# Patient Record
Sex: Female | Born: 1945
Health system: Southern US, Community
[De-identification: ages and names within clinical notes are randomized; demographics above are authoritative.]

## PROBLEM LIST (undated history)

## (undated) DIAGNOSIS — K439 Ventral hernia without obstruction or gangrene: Secondary | ICD-10-CM

## (undated) DIAGNOSIS — M1990 Unspecified osteoarthritis, unspecified site: Secondary | ICD-10-CM

## (undated) DIAGNOSIS — D649 Anemia, unspecified: Secondary | ICD-10-CM

## (undated) DIAGNOSIS — I639 Cerebral infarction, unspecified: Secondary | ICD-10-CM

## (undated) DIAGNOSIS — M51369 Other intervertebral disc degeneration, lumbar region without mention of lumbar back pain or lower extremity pain: Secondary | ICD-10-CM

## (undated) DIAGNOSIS — K5090 Crohn's disease, unspecified, without complications: Secondary | ICD-10-CM

## (undated) DIAGNOSIS — M5431 Sciatica, right side: Secondary | ICD-10-CM

## (undated) DIAGNOSIS — M5136 Other intervertebral disc degeneration, lumbar region: Secondary | ICD-10-CM

## (undated) DIAGNOSIS — I10 Essential (primary) hypertension: Secondary | ICD-10-CM

## (undated) HISTORY — DX: Essential (primary) hypertension: I10

## (undated) HISTORY — PX: CAROTID ENDARTERECTOMY: SUR193

## (undated) HISTORY — DX: Crohn's disease, unspecified, without complications: K50.90

## (undated) HISTORY — PX: HEMICOLECTOMY: SHX854

## (undated) HISTORY — PX: TUBAL LIGATION: SHX77

## (undated) HISTORY — PX: CHOLECYSTECTOMY: SHX55

---

## 2002-01-21 ENCOUNTER — Emergency Department (HOSPITAL_COMMUNITY): Admission: EM | Admit: 2002-01-21 | Discharge: 2002-01-21 | Payer: Self-pay | Admitting: Emergency Medicine

## 2002-01-21 ENCOUNTER — Encounter: Payer: Self-pay | Admitting: Emergency Medicine

## 2002-01-25 ENCOUNTER — Encounter: Payer: Self-pay | Admitting: Emergency Medicine

## 2002-01-25 ENCOUNTER — Ambulatory Visit (HOSPITAL_COMMUNITY): Admission: RE | Admit: 2002-01-25 | Discharge: 2002-01-25 | Payer: Self-pay | Admitting: Emergency Medicine

## 2002-05-04 ENCOUNTER — Encounter: Payer: Self-pay | Admitting: Vascular Surgery

## 2002-05-06 ENCOUNTER — Encounter (INDEPENDENT_AMBULATORY_CARE_PROVIDER_SITE_OTHER): Payer: Self-pay | Admitting: *Deleted

## 2002-05-06 ENCOUNTER — Inpatient Hospital Stay (HOSPITAL_COMMUNITY): Admission: RE | Admit: 2002-05-06 | Discharge: 2002-05-08 | Payer: Self-pay | Admitting: Vascular Surgery

## 2004-09-01 ENCOUNTER — Inpatient Hospital Stay (HOSPITAL_COMMUNITY): Admission: EM | Admit: 2004-09-01 | Discharge: 2004-09-03 | Payer: Self-pay | Admitting: Emergency Medicine

## 2004-09-01 ENCOUNTER — Ambulatory Visit: Payer: Self-pay | Admitting: Internal Medicine

## 2004-10-30 ENCOUNTER — Inpatient Hospital Stay (HOSPITAL_COMMUNITY): Admission: EM | Admit: 2004-10-30 | Discharge: 2004-11-04 | Payer: Self-pay | Admitting: Emergency Medicine

## 2004-10-30 ENCOUNTER — Ambulatory Visit: Payer: Self-pay | Admitting: Gastroenterology

## 2004-11-01 ENCOUNTER — Encounter: Payer: Self-pay | Admitting: Physical Medicine and Rehabilitation

## 2006-04-11 ENCOUNTER — Observation Stay (HOSPITAL_COMMUNITY): Admission: EM | Admit: 2006-04-11 | Discharge: 2006-04-16 | Payer: Self-pay | Admitting: Emergency Medicine

## 2006-04-11 ENCOUNTER — Ambulatory Visit: Payer: Self-pay | Admitting: Gastroenterology

## 2006-04-14 ENCOUNTER — Encounter (INDEPENDENT_AMBULATORY_CARE_PROVIDER_SITE_OTHER): Payer: Self-pay | Admitting: *Deleted

## 2006-06-29 ENCOUNTER — Inpatient Hospital Stay (HOSPITAL_COMMUNITY): Admission: EM | Admit: 2006-06-29 | Discharge: 2006-07-08 | Payer: Self-pay | Admitting: Emergency Medicine

## 2006-06-29 ENCOUNTER — Ambulatory Visit: Payer: Self-pay | Admitting: Internal Medicine

## 2006-07-03 ENCOUNTER — Encounter (INDEPENDENT_AMBULATORY_CARE_PROVIDER_SITE_OTHER): Payer: Self-pay | Admitting: Specialist

## 2006-07-21 ENCOUNTER — Emergency Department (HOSPITAL_COMMUNITY): Admission: EM | Admit: 2006-07-21 | Discharge: 2006-07-21 | Payer: Self-pay | Admitting: Emergency Medicine

## 2007-03-08 ENCOUNTER — Emergency Department (HOSPITAL_COMMUNITY): Admission: EM | Admit: 2007-03-08 | Discharge: 2007-03-08 | Payer: Self-pay | Admitting: Emergency Medicine

## 2008-12-11 ENCOUNTER — Emergency Department (HOSPITAL_COMMUNITY): Admission: EM | Admit: 2008-12-11 | Discharge: 2008-12-11 | Payer: Self-pay | Admitting: Emergency Medicine

## 2008-12-31 ENCOUNTER — Emergency Department (HOSPITAL_COMMUNITY): Admission: EM | Admit: 2008-12-31 | Discharge: 2008-12-31 | Payer: Self-pay | Admitting: Emergency Medicine

## 2010-03-13 ENCOUNTER — Ambulatory Visit (HOSPITAL_COMMUNITY): Admission: RE | Admit: 2010-03-13 | Discharge: 2010-03-13 | Payer: Self-pay | Admitting: Family Medicine

## 2010-09-22 LAB — URINALYSIS, ROUTINE W REFLEX MICROSCOPIC
Bilirubin Urine: NEGATIVE
Glucose, UA: NEGATIVE mg/dL
Ketones, ur: NEGATIVE mg/dL
Specific Gravity, Urine: <1.005 — ABNORMAL LOW (ref 1.005–1.030)
Urobilinogen, UA: 0.2 mg/dL (ref 0.0–1.0)
pH: 6 (ref 5.0–8.0)

## 2010-09-22 LAB — URINE CULTURE

## 2010-09-22 LAB — URINE MICROSCOPIC-ADD ON

## 2010-09-23 LAB — CBC
MCHC: 35.2 g/dL (ref 30.0–36.0)
MCV: 97.1 fL (ref 78.0–100.0)
RBC: 3.57 MIL/uL — ABNORMAL LOW (ref 3.87–5.11)

## 2010-09-23 LAB — POCT CARDIAC MARKERS
CKMB, poc: 1.1 ng/mL (ref 1.0–8.0)
Myoglobin, poc: 64 ng/mL (ref 12–200)
Troponin i, poc: <0.05 ng/mL (ref 0.00–0.09)

## 2010-09-23 LAB — BASIC METABOLIC PANEL
CO2: 27 mEq/L (ref 19–32)
Chloride: 104 mEq/L (ref 96–112)
Glucose, Bld: 98 mg/dL (ref 70–99)

## 2010-09-23 LAB — DIFFERENTIAL
Basophils Absolute: 0 10*3/uL (ref 0.0–0.1)
Basophils Relative: 0 % (ref 0–1)
Eosinophils Relative: 2 % (ref 0–5)
Lymphocytes Relative: 23 % (ref 12–46)
Neutro Abs: 4.6 10*3/uL (ref 1.7–7.7)
Neutrophils Relative %: 68 % (ref 43–77)

## 2010-09-26 ENCOUNTER — Emergency Department (HOSPITAL_COMMUNITY): Payer: Medicare Other

## 2010-09-26 ENCOUNTER — Inpatient Hospital Stay (HOSPITAL_COMMUNITY)
Admission: AD | Admit: 2010-09-26 | Discharge: 2010-09-29 | DRG: 389 | Disposition: A | Discharge status: Home / Self Care | Payer: Medicare Other | Source: Ambulatory Visit | Attending: General Surgery | Admitting: General Surgery

## 2010-09-26 DIAGNOSIS — K560 Paralytic ileus: Principal | ICD-10-CM | POA: Diagnosis present

## 2010-09-26 DIAGNOSIS — N390 Urinary tract infection, site not specified: Secondary | ICD-10-CM | POA: Diagnosis present

## 2010-09-26 DIAGNOSIS — K5090 Crohn's disease, unspecified, without complications: Secondary | ICD-10-CM | POA: Diagnosis present

## 2010-09-26 DIAGNOSIS — K430 Incisional hernia with obstruction, without gangrene: Secondary | ICD-10-CM | POA: Diagnosis present

## 2010-09-26 DIAGNOSIS — I10 Essential (primary) hypertension: Secondary | ICD-10-CM | POA: Diagnosis present

## 2010-09-26 LAB — BASIC METABOLIC PANEL
BUN: 13 mg/dL (ref 6–23)
CO2: 27 mEq/L (ref 19–32)
Calcium: 10 mg/dL (ref 8.4–10.5)
Creatinine, Ser: 0.96 mg/dL (ref 0.4–1.2)
Glucose, Bld: 129 mg/dL — ABNORMAL HIGH (ref 70–99)
Sodium: 131 mEq/L — ABNORMAL LOW (ref 135–145)

## 2010-09-26 LAB — DIFFERENTIAL
Eosinophils Absolute: 0.2 10*3/uL (ref 0.0–0.7)
Eosinophils Relative: 1 % (ref 0–5)
Monocytes Absolute: 0.9 10*3/uL (ref 0.1–1.0)
Monocytes Relative: 5 % (ref 3–12)
Neutro Abs: 15.8 10*3/uL — ABNORMAL HIGH (ref 1.7–7.7)
Neutrophils Relative %: 81 % — ABNORMAL HIGH (ref 43–77)

## 2010-09-26 LAB — CBC
HCT: 43.3 % (ref 36.0–46.0)
Hemoglobin: 14.8 g/dL (ref 12.0–15.0)
MCH: 33.1 pg (ref 26.0–34.0)
MCHC: 34.2 g/dL (ref 30.0–36.0)
MCV: 96.9 fL (ref 78.0–100.0)

## 2010-09-26 LAB — URINALYSIS, ROUTINE W REFLEX MICROSCOPIC
Glucose, UA: NEGATIVE mg/dL
Ketones, ur: NEGATIVE mg/dL
Leukocytes, UA: NEGATIVE
Nitrite: NEGATIVE
Protein, ur: NEGATIVE mg/dL

## 2010-09-26 LAB — URINE MICROSCOPIC-ADD ON

## 2010-09-27 LAB — DIFFERENTIAL
Basophils Absolute: 0 10*3/uL (ref 0.0–0.1)
Eosinophils Absolute: 0 10*3/uL (ref 0.0–0.7)
Eosinophils Relative: 0 % (ref 0–5)
Lymphocytes Relative: 6 % — ABNORMAL LOW (ref 12–46)
Monocytes Absolute: 0.7 10*3/uL (ref 0.1–1.0)

## 2010-09-27 LAB — COMPREHENSIVE METABOLIC PANEL
BUN: 13 mg/dL (ref 6–23)
Calcium: 8.6 mg/dL (ref 8.4–10.5)
Glucose, Bld: 130 mg/dL — ABNORMAL HIGH (ref 70–99)
Sodium: 133 mEq/L — ABNORMAL LOW (ref 135–145)
Total Protein: 6.4 g/dL (ref 6.0–8.3)

## 2010-09-27 LAB — CBC
HCT: 38.5 % (ref 36.0–46.0)
MCHC: 34 g/dL (ref 30.0–36.0)
MCV: 96.7 fL (ref 78.0–100.0)
RDW: 12.7 % (ref 11.5–15.5)

## 2010-09-27 MED ORDER — IOHEXOL 300 MG/ML  SOLN
100.0000 mL | Freq: Once | INTRAMUSCULAR | Status: AC | PRN
  Administered 2010-09-27: 100 mL via INTRAVENOUS

## 2010-09-28 LAB — COMPREHENSIVE METABOLIC PANEL
AST: 16 U/L (ref 0–37)
BUN: 8 mg/dL (ref 6–23)
CO2: 24 mEq/L (ref 19–32)
Chloride: 95 mEq/L — ABNORMAL LOW (ref 96–112)
Creatinine, Ser: 0.84 mg/dL (ref 0.4–1.2)
GFR calc non Af Amer: >60 mL/min (ref >60)
Total Bilirubin: 0.4 mg/dL (ref 0.3–1.2)

## 2010-09-28 LAB — CBC
HCT: 38.9 % (ref 36.0–46.0)
Hemoglobin: 13.1 g/dL (ref 12.0–15.0)
MCH: 32.6 pg (ref 26.0–34.0)
MCV: 96.8 fL (ref 78.0–100.0)
RBC: 4.02 MIL/uL (ref 3.87–5.11)

## 2010-09-28 LAB — DIFFERENTIAL
Lymphs Abs: 1.5 10*3/uL (ref 0.7–4.0)
Monocytes Relative: 8 % (ref 3–12)
Neutro Abs: 9.6 10*3/uL — ABNORMAL HIGH (ref 1.7–7.7)
Neutrophils Relative %: 79 % — ABNORMAL HIGH (ref 43–77)

## 2010-09-28 LAB — PHOSPHORUS: Phosphorus: 2.8 mg/dL (ref 2.3–4.6)

## 2010-09-29 LAB — COMPREHENSIVE METABOLIC PANEL
Albumin: 3.1 g/dL — ABNORMAL LOW (ref 3.5–5.2)
BUN: 9 mg/dL (ref 6–23)
Calcium: 8.8 mg/dL (ref 8.4–10.5)
Creatinine, Ser: 0.78 mg/dL (ref 0.4–1.2)
Potassium: 4.1 mEq/L (ref 3.5–5.1)
Total Protein: 6.2 g/dL (ref 6.0–8.3)

## 2010-09-29 LAB — DIFFERENTIAL
Basophils Relative: 0 % (ref 0–1)
Eosinophils Absolute: 0.1 10*3/uL (ref 0.0–0.7)
Monocytes Absolute: 0.7 10*3/uL (ref 0.1–1.0)
Neutrophils Relative %: 74 % (ref 43–77)

## 2010-09-29 LAB — CBC
MCHC: 34.4 g/dL (ref 30.0–36.0)
MCV: 95.9 fL (ref 78.0–100.0)
Platelets: 180 10*3/uL (ref 150–400)
RDW: 12.6 % (ref 11.5–15.5)
WBC: 12.3 10*3/uL — ABNORMAL HIGH (ref 4.0–10.5)

## 2010-09-29 LAB — PHOSPHORUS: Phosphorus: 2.6 mg/dL (ref 2.3–4.6)

## 2010-09-30 NOTE — H&P (Signed)
[name], [name] NO.:  [id number]  MEDICAL RECORD NO.:  [id number]           PATIENT TYPE:  I  LOCATION:  A326                          FACILITY:  [facility]  PHYSICIAN:  Tilford Pillar, MD      DATE OF BIRTH:  15-Mar-1946  DATE OF ADMISSION:  09/26/2010 DATE OF DISCHARGE:  LH                             HISTORY & PHYSICAL   CHIEF COMPLAINT:  Diffuse abdominal pain.  HISTORY OF PRESENT ILLNESS:  The patient is a 65 year old female who presented to [facility] emergency department with increasing diffuse abdominal pain and nausea and emesis.  She states she has had similar symptomatology in the past.  She denies any fevers or chills. She has not had any significant change with oral intake.  Her last bowel movement was actually yesterday and this was normal.  No melena, no hematochezia, no constipation, and no diarrhea.  She has had normal urination.  She denies any change in urination.  No hematuria.  No dysuria.  PAST MEDICAL HISTORY: 1. Hypertension. 2. Arthritis. 3. Crohn disease. 4. History of CVA.  PAST SURGICAL HISTORY: 1. Carotid endarterectomy. 2. Right hemicolectomy.  MEDICATIONS:  History of Pentasa utilization, she has not been on any Crohn's medications over the last several years, metoprolol, lisinopril, and aspirin.  ALLERGIES:  METRONIDAZOLE.  SOCIAL HISTORY:  No tobacco and no alcohol use.  FAMILY HISTORY:  Noncontributory.  REVIEW OF SYSTEMS:  CONSTITUTIONAL:  Unremarkable.  EYES:  Unremarkable. EARS, NOSE, AND THROAT:  Unremarkable.  RESPIRATORY:  Unremarkable. CARDIOVASCULAR:  Unremarkable.  GASTROINTESTINAL:  As per HPI. GENITOURINARY:  Unremarkable.  MUSCULOSKELETAL:  Unremarkable.  SKIN: Unremarkable.  ENDOCRINE:  Unremarkable.  PHYSICAL EXAMINATION:  VITAL SIGNS:  Temperature 98.5, heart rate 106, respirations 17, blood pressure 142/82, and she has 94% O2 saturation on room air. GENERAL:  She is in a supine position  on the hospital bed.  She is not in any acute distress but she does appear somewhat uncomfortable.  She is alert and oriented x3. HEENT:  Scalp:  No deformities or masses.  Eyes:  Pupils equal, round, and reactive to light.  Extraocular movements are intact.  No scleral icterus.  Conjunctivae pallor is noted.  Oral mucosa pink.  No occlusion. NECK:  Trachea is midline.  No cervical lymphadenopathy. PULMONARY:  Unlabored respiration.  She is clear to auscultation bilaterally. CARDIOVASCULAR:  Tachycardic but regular rhythm.  No murmurs or gallops. She has 2+ radial and femoral pulses bilaterally. ABDOMEN:  Diminished but present bowel sounds.  Abdomen is soft, obese, moderate-to-mild tenderness in the left lower quadrant.  No diffuse peritoneal signs.  She does have a large palpable midline incisional hernia.  No discomfort or pain on palpation of the hernia. EXTREMITIES:  Warm and dry.  PERTINENT LABORATORY DATA AND STUDIES:  CBC:  White blood cell count 17.3, hemoglobin 13.1, hematocrit 38.5, and platelets 200.  Basic metabolic panel:  Sodium 133, potassium 4.8, chloride 96, bicarb 29, BUN 13, creatinine 0.95, and blood glucose 130.  UA demonstrates positive blood and bacteria.  CT of the abdomen and pelvis demonstrates no evidence  any free air or free fluid.  There is a large midline incisional hernia with incarcerated distal stomach, colon, and small bowel.  There is no clear transition point within the hernia.  There is prominent bowel throughout the abdomen but no clear dilatation and/or transition point.  There is stool noted throughout the colon.  No colon wall thickening.  No free fluid is noted.  ASSESSMENT AND PLAN:  Abdominal pain, unspecified.  Plan at this point is to continue the patient in n.p.o. status, continue IV fluid. Nasogastric was placed in the emergency department and as this has been placed we will continue it, especially as the patient is continuing to have  symptoms of nausea.  My suspicion is that she may have some component of an ileus process, especially with the possibility of urinary tract infection.  We will start her empirically on broad- spectrum antibiotics, although clear etiology has not been identified. I am also suspicious that some of this patient symptomatology may be from her Crohn disease, although her symptomatology is not necessarily classic and/or consistent for her past flare-ups.  She has not seen a gastroenterologist for management and states that she has not had required any management that she was told that she would no longer need management for her Crohn disease.  She also states that Dr. Lovell Sheehan who she reports is her primary physician has not seen her over the last several years.  When asked if she had seen her primary care physician, the patient denies having seen him recently.  I believe this is Dr. Regino Schultze.  At this point, we will again continue the patient with IV fluid hydration, empiric antibiotics, bowel rest, deep venous thrombosis prophylaxis, and close monitoring.  Should her abdominal symptomatology become more consistent with a Crohns flare, Gastroenterology consultation may be obtained.  However, at this time, we will continue conservative management.     Tilford Pillar, MD     BZ/MEDQ  D:  09/27/2010  T:  09/28/2010  Job:  [id number]  Electronically Signed by Tilford Pillar MD on 09/30/2010 11:24:10 AM

## 2010-10-10 ENCOUNTER — Ambulatory Visit (INDEPENDENT_AMBULATORY_CARE_PROVIDER_SITE_OTHER): Payer: Medicare Other | Admitting: Internal Medicine

## 2010-10-10 DIAGNOSIS — K5090 Crohn's disease, unspecified, without complications: Secondary | ICD-10-CM

## 2010-11-01 NOTE — Op Note (Signed)
[name], [name]                            ACCOUNT NO.:  [id number]   MEDICAL RECORD NO.:  [id number]                   PATIENT TYPE:  INP   LOCATION:  3304                                 FACILITY:  [facility]   PHYSICIAN:  Judeth Cornfield. Scot Dock, M.D.        DATE OF BIRTH:  06-May-1946   DATE OF PROCEDURE:  05/06/2002  DATE OF DISCHARGE:                                 OPERATIVE REPORT   PREOPERATIVE DIAGNOSES:  Symptomatic left carotid stenosis.   POSTOPERATIVE DIAGNOSES:  Symptomatic left carotid stenosis.   PROCEDURE:  Left carotid endarterectomy with Dacron patch angioplasty.   SURGEON:  Judeth Cornfield. Scot Dock, M.D.   ASSISTANT:  Nelda Severe. Kellie Simmering, M.D., Marcellus Scott, P.A.   ANESTHESIA:  General.   PREOPERATIVE DIAGNOSES:  Symptomatic left carotid stenosis.   POSTOPERATIVE DIAGNOSES:  Symptomatic left carotid stenosis.   PROCEDURE:  Left carotid endarterectomy with Dacron patch angioplasty.   ANESTHESIA:  General.   INDICATIONS:  This is a 65 year old woman who had presented with a tight,  greater than 95% left carotid stenosis.  She had had a left hemispheric  stroke in August, associated with some residual right upper extremity  weakness and right lower extremity weakness.  Of note when she presented for  her elective surgery today, she had an episode of expressive aphasia which  lasted several minutes and resolved.  She then had a second episode while  waiting for surgery.  We proceeded as planned, given these crescendo TIAs,  consistent with left hemispheric events.  The procedure and potential  complications were discussed with the patient and her family preoperatively.   TECHNIQUE:  The patient was taken to the operating room and received a  general anesthetic.  The left neck was prepped and draped in the usual  sterile fashion.  An incision was made along the anterior border of the  sternocleidomastoid, and dissection was carried down to the common carotid  artery, which was dissected free and controlled with a tourniquet.  Of note,  there was a focal plaque in the more proximal common carotid artery, and  therefore, I extended the incision and extended the dissection down on the  common carotid artery below this plaque.  The facial vein was divided  between 2-0 silk ties, and the internal carotid artery was controlled above  the plaque.  Again, the plaque extended quite high, but I was able to get  above the plaque.  The external carotid artery and superior thyroid arteries  were also controlled.  The patient was then heparinized with 7500 units of  heparin.  Clamps were then placed on the external, the internal, and then  the common carotid artery.  A longitudinal arteriotomy was made over the  common carotid artery, and this was extended through the plaque into the  internal carotid artery above the plaque.  A 10 jet was placed into the  internal carotid artery, backbled, and then  placed into the common carotid  artery and secured with a tourniquet.  Flow was re-established to the shunt.  Next, an endarterectomy plane was established proximally, and the plaque was  sharply divided.  Eversion endarterectomy was performed of the external  carotid artery.  Distally, the plaque tapered nicely.  One tacking suture  was placed with 6-0 Prolene.  A long Dacron patch was then sewn using  continuous 6-0 Prolene suture.  Prior to completing the patch closure, the  shunt was removed, the vessels backbled and flushed appropriately, and the  anastomosis completed.  Flow was re-established first to the external  carotid artery and then to the internal carotid artery.  At the completion,  there was a good pulse distal to the patch and a good Doppler signal.  Hemostasis was obtained in the wound.  The heparin was partially reversed  with Protamine.  The wound was closed in a deep layer of 3-0 Vicryl.  The  platysma was closed with a running 3-0 Vicryl.  The  skin was closed with a 4-  0 subcuticular stitch.  A sterile dressing was applied.  The patient  tolerated the procedure well and was transferred to the recovery room in  satisfactory condition.  All needle and sponge counts were correct.                                               Judeth Cornfield. Scot Dock, M.D.    CSD/MEDQ  D:  05/06/2002  T:  05/07/2002  Job:  [id number]   cc:   Leonides Grills, M.D.  [address] [place] [address]  Fax: [phone]

## 2010-11-01 NOTE — Discharge Summary (Signed)
NAMEWELMA, [name] NO.:  [id number]   MEDICAL RECORD NO.:  [id number]          PATIENT TYPE:  INP   LOCATION:  A327                          FACILITY:  [facility]   PHYSICIAN:  Jamesetta So, M.D.  DATE OF BIRTH:  18-Dec-1945   DATE OF ADMISSION:  06/29/2006  DATE OF DISCHARGE:  01/23/2008LH                               [place] COURSE SUMMARY:  The patient is a 65 year old white female with  a history of Crohn's disease who was admitted by Dr. Stann Mainland of GI for  treatment of an acute flare-up of her Crohn's disease.  Initial CAT  scans revealed fluid collection with perforation around the terminal  ileum.  She was placed on intravenous antibiotics and bowel rest.  She  was also started on Pentasa.  There has been difficulty with patient  compliance with her Crohn's disease medications in the past.  Surgery  consultation was obtained.  A followup CT scan was performed several  days later which revealed resolution of the perforation, though there  was still fluid collection around the terminal ileum.  This had not  changed from a previous CAT scan done several months earlier.  After  discussion with GI and the patient, it was elected to proceed with  surgery.  An ileocecectomy was performed on July 03, 2006.  She  tolerated the procedure remarkably well.  Her postoperative course was  the most part unremarkable.  Her diet was advanced without difficulty.  The patient is being discharged home on July 08, 2006 in good and  improving condition.   DISCHARGE INSTRUCTIONS:  The patient is to follow up with Dr. Aviva Signs on July 14, 2006.  I will set up a followup GI consultation  at that time.  Discharge medications include Darvocet N-100s one to two  tablets p.o. q. 4 hours p.r.n. pain.  She is to resume all her other  medications as previously prescribed.   PRINCIPAL DIAGNOSES:  1. Ileitis secondary to Crohn's disease.  2.  Intra-abdominal abscess.  3. Hypertension.  4. Peripheral vascular disease.  5. A history of a stroke.   PRINCIPAL PROCEDURES:  Ileocecectomy by Dr. Aviva Signs on July 03, 2006.      Jamesetta So, M.D.  Electronically Signed     MAJ/MEDQ  D:  07/08/2006  T:  07/08/2006  Job:  [id number]   cc:   [name], M.D.  [address]  [place] [address]   CHART

## 2010-11-01 NOTE — Consult Note (Signed)
[name], [name]                  ACCOUNT NO.:  [id number]   MEDICAL RECORD NO.:  [id number]          PATIENT TYPE:  INP   LOCATION:  A327                          FACILITY:  [facility]   PHYSICIAN:  [name], M.D.    DATE OF BIRTH:  Jul 26, 1945   DATE OF CONSULTATION:  10/31/2004  DATE OF DISCHARGE:                                   CONSULTATION   REASON FOR CONSULTATION:  Intra-abdominal abscess, possible Crohn's disease.   REQUESTING PHYSICIAN:  [name], M.D.   HISTORY OF PRESENT ILLNESS:  Patient is a 65 year old Caucasian female who  presented to the emergency department yesterday with complaints of hot  flashes and cold sweat, near syncope.  She had also had some nausea and  intermittent diarrhea.  In March 2006 she had been admitted with acute  enteritis and had a CT of the abdomen and pelvis which showed some  nonspecific terminal ileum thickness.  It was felt that she may have  foodborne illness at that time.  She was supposed to have an outpatient  colonoscopy but she failed to follow up due to lack of insurance.  Given  this history she had a CT of the abdomen and pelvis yesterday.  This showed  marked bowel wall thickening associated with the terminal ileum now with  stricture formation.  She had developed marked inflammatory changes within  the adjacent mesenteric fat as well as a 4.8 x 3.1 cm abscess which was new.  She had multiple large mesenteric lymph nodes as well.  Her white count was  28,600.  Today it is 18,000.  Hemoglobin 10.7, hematocrit 31.6.  Albumin  2.6.   She states that she has not really felt well since she went home in March.  Regarding her bowel movements she tends to have some constipation at times  and at other times may have mild diarrhea.  She developed constipation last  week and started having troubles with her hemorrhoids.  She states she has a  little blood in her stool at that time.  Denies any significant abdominal  pain.  She had  mild nausea yesterday, but no vomiting.  Denies any melena.   HOME MEDICATIONS:  1.  Pepto-Bismol p.r.n.  2.  Aleve occasionally.  3.  Aspirin 325 mg daily.  4.  Metoprolol 50 mg daily.  5.  Tylenol p.r.n.  6.  Lisinopril 10 mg daily.   ALLERGIES:  FLAGYL causes hallucination.   PAST MEDICAL HISTORY:  1.  Recent hospitalization for acute enteritis as outlined above.  She had      small bowel wall thickening, especially of the terminal ileum at that      time.  2.  CVA in August 2003.  3.  High-grade left carotid stenosis status post carotid endarterectomy.  4.  Hypertension.  5.  Arthritis.  6.  Hemorrhoids.  7.  Status post tubal ligation.  8.  D&C x4.  9.  She never had a colonoscopy.   FAMILY HISTORY:  No known family history of inflammatory bowel disease,  colorectal carcinoma, liver, or chronic  GI problems.  Mother died at 16 of  MI.  Father died in his 77s secondary to cancer of unknown etiology.  She  has one daughter with mental health issues.  She recently lost a  granddaughter at age 58 due to accident.   SOCIAL HISTORY:  She lost her husband of 30 years to COPD a month ago.  She  has one daughter who is 64 years old and is disabled and lives with her.  She reports a 24-year history of tobacco use smoking anywhere from a pack  and a half to two packs a day.  Denies any alcohol or drug use.   REVIEW OF SYSTEMS:  CONSTITUTIONAL:  Denies any weight loss.  CARDIOPULMONARY:  Denies any shortness of breath, dyspnea, cough, or  hemoptysis.  GI:  See HPI.  Additional, has occasional heartburn once or  twice a month due to dietary indiscretion.  Denies any dysphagia,  odynophagia.   PHYSICAL EXAMINATION:  VITAL SIGNS:  Weight 151.1 which is down 9 pounds  according to the hospital medical records, height 63 inches, temperature  97.5, pulse 79, respirations 20, blood pressure 88/60.  GENERAL:  Pleasant, well-nourished, well-developed Caucasian female in no  acute  distress.  SKIN:  Warm and dry, no jaundice.  HEENT:  Conjunctivae are slightly pale.  Sclerae nonicteric.  Oropharyngeal  mucosa moist and pink.  No lesions, erythema, or exudate.  No  lymphadenopathy, thyromegaly.  CHEST:  Lungs are clear to auscultation.  CARDIAC:  Regular rate and rhythm.  Normal S1, S2.  2/6 systolic ejection  murmur.  ABDOMEN:  Positive bowel sounds, soft, nontender, nondistended.  No  organomegaly or masses.  No rebound tenderness or guarding.  No abdominal  bruits or hernias.  EXTREMITIES:  No edema.   LABORATORIES:  As mentioned in HPI.  In addition, platelet count 470,000.  INR 1.  MCV 85.6.  Sodium 138, potassium 4.3, BUN 6, creatinine 0.9, glucose  184.  Lipase 21, total bilirubin 0.4, alkaline phosphatase 105, AST 24, ALT  18, albumin 2.6.   IMPRESSION:  Patient is a 65 year old lady who has evidence of marked bowel  wall thickening associated with a terminal ileum associated with stricture  and abscess formation.  CT findings have progressed since March 2006.  It  appears that she has Crohn's disease.  Clinically, she is stable.  She has  no significant abdominal findings on examination.  Her white count is  improved.  She is on broad-spectrum antibiotics and Solu-Medrol.   RECOMMENDATIONS:  1.  Continue Unasyn and Solu-Medrol.  2.  Will review CT films.  I have discussed findings with Dr. Arnoldo Douglas who      states that she does not need any      emergent surgical intervention.  However, if her abscess does not      improve based on size in the next 48 hours she may need to have      drainage.  Ultimately, she will need to have a colonoscopy at some point      in the near future.      LL/MEDQ  D:  10/31/2004  T:  10/31/2004  Job:  [id number]

## 2010-11-01 NOTE — H&P (Signed)
[name], [name] NO.:  [id number]   MEDICAL RECORD NO.:  [id number]          PATIENT TYPE:  INP   LOCATION:  A327                          FACILITY:  [facility]   PHYSICIAN:  Leonides Grills, M.D.DATE OF BIRTH:  Sep 30, 1945   DATE OF ADMISSION:  10/30/2004  DATE OF DISCHARGE:  LH                                HISTORY & PHYSICAL   CHIEF COMPLAINT:  Near syncope and abdominal pain.   HISTORY OF PRESENT ILLNESS:  This is a 65 year old female who had a similar  admission approximately three months ago for non-specific enteritis.  At  that time, the patient deverfesced with antibiotics and was lost to  followup.  She was to see GI for TCS, but due to her lack of insurance she  did not follow up with them or me.  She states she has not felt well since  she went home from the hospital.  She has been having fever, chills, sweats,  and lightheadedness.  Workup in the emergency room showed an inflammation  and localized abscess in the terminal ileum.  The patient will be admitted  for IV antibiotics, steroids, and surgical GI consultation.   PAST MEDICAL HISTORY:  1.  History of hypertension for which she takes Lopressor 50 mg daily and      Lisinopril 10 mg daily.  2.  The patient is also on Protonix.  3.  Status post CVA and lymphendarterectomy.   ALLERGIES:  FLAGYL.   REVIEW OF SYSTEMS:  She denies chest pain or shortness of breath.  She does  have intermittent diarrhea, constipation, and hemorrhoids.   PHYSICAL EXAMINATION:  GENERAL:  An elderly female who appears pale and  miserable.  VITAL SIGNS:  She is afebrile.  Her blood pressure is 110/60, pulse is 88  and regular, respirations are 20 and unlabored.  HEENT:  TM's are normal.  Pupils equal, round, reactive to light and  accommodation.  Oropharynx benign.  NECK:  Supple without JVD, bruit, or thyromegaly.  LUNGS:  Clear in all areas.  HEART:  Regular sinus rhythm without murmurs, rubs, or gallops.  ABDOMEN:  Soft with positive bowel sounds and mild periumbilical tenderness  without significant rebound.  RECTAL:  Deferred.  EXTREMITIES:  Without cyanosis, clubbing, or edema.  NEUROLOGIC:  Grossly intact.   ASSESSMENT:  1.  Inflammation of the terminal ileum with abscess, probable Crohn's.  2.  Hypertension.  3.  Status post cerebrovascular accident.      WMM/MEDQ  D:  10/30/2004  T:  10/30/2004  Job:  [id number]

## 2010-11-01 NOTE — H&P (Signed)
[name] NO.:  [id number]   MEDICAL RECORD NO.:  [id number]          PATIENT TYPE:  INP   LOCATION:  A327                          FACILITY:  [facility]   PHYSICIAN:  Caro Hight, M.D.      DATE OF BIRTH:  [birth year]   DATE OF ADMISSION:  06/29/2006  DATE OF DISCHARGE:  LH                              HISTORY & PHYSICAL   CHIEF COMPLAINT:  Vomiting.   HISTORY OF PRESENT ILLNESS:  [name] is a 65 year old female who  presented to the emergency department with a near syncopal episode.  She  had been feeling unwell since December 2007.  Before Christmas, she and  her family had an episode of vomiting which lasted three days.  She  reports having fevers from 102 to 106.  She states over the weekend, her  temperature has been as high as 99.5.  She has been able to eat  breakfast and then, in the afternoon, has been having episodes of  vomiting.  This late morning, she had an episode where she felt hot, got  dizzy, felt light headed, and then vomited.  Yesterday, she had potato  salad with green beans and a hamburger.  She also was able to tolerate  eggs, oatmeal, and toast.  She usually gets sick around noon and then by  evening, she needs a heating pad on her back.  She denies any abdominal  pain currently.  She says her diarrhea has been straightened out.  Her  stomach pain varies, it can be mild to severe.  She uses Ultram or  acetaminophen.  She received Ultram from Dr. Orson Ape because of other  pains.   PAST MEDICAL HISTORY:  1. Stroke in 2007.  2. Carotid artery stenosis.  3. Hypertension.  4. Arthritis.  5. History of abdominal abscess requiring percutaneous drainage in May      2006.   PAST SURGICAL HISTORY:  1. Tubal ligation.  2. Carotid endarterectomy.  3. D&C.   ALLERGIES:  FLAGYL and an unknown pain medicine.   MEDICATIONS:  1. Lisinopril.  2. Aspirin.  3. Olsalazine.  4. Metoprolol.  5. Ultram.  6. Acetaminophen.   FAMILY  HISTORY:  She denies any family history of pancreatic,  gallbladder, or liver cancer.   SOCIAL HISTORY:  She still continues to have no health insurance.  She  has one daughter.  She denies any alcohol or tobacco use.   REVIEW OF SYSTEMS:  Per the HPI, otherwise, all systems are negative.   PHYSICAL EXAMINATION:  VITAL SIGNS:  Temperature 98.5, blood pressure 112/71, pulse 95, weight  131 pounds, height 63 inches.  GENERAL:  She is in no apparent distress, alert and oriented x4.  HEENT:  Exam is normocephalic, atraumatic, pupils equal and reactive to  light, mouth with no oral lesions.  Posterior pharynx without erythema  or exudate.  NECK:  Full range of motion, no lymphadenopathy.  LUNGS:  Clear to auscultation bilaterally.  CARDIOVASCULAR:  Exam shows a regular rhythm, no murmur, normal S1 and  S2.  ABDOMEN:  Bowel sounds  are present, soft, nondistended, mild tenderness  to palpation in the bilateral lower quadrants, without rebound or  guarding.  EXTREMITIES:  Without cyanosis, clubbing, or edema.  NEUROLOGICAL:  She has no focal neurological deficits.   LABORATORY DATA:  White count 19.5, hemoglobin 10.9, platelets 515.  BUN  8, creatinine 0.6.   RADIOGRAPHIC STUDIES:  CT scan of the abdomen and pelvis with contrast  reveals two segments of distal ileum with a multiloculated contained  perforated area and possible early abscess formation as well as sigmoid  diverticulosis.   ASSESSMENT:  [name] is a 65 year old female with Crohn's disease now  with a flare, elevated white count, and a contained perforation with  possible early abscess.   PLAN:  1. Zosyn 3.375 grams IV q.6h.  2. NG tube to intermittent low wall suction.  3. Will obtain a double lumen PICC line and initiate      hyperalimentation.  4. Strict n.p.o. for 2-3 days and will repeat CT scan.  Will obtain      surgical consultation and will decide if surgery warranted.  5. P.r.n. Dilaudid for  pain.      Caro Hight, M.D.  Electronically Signed     SM/MEDQ  D:  06/30/2006  T:  06/30/2006  Job:  [id number]   cc:   [name], M.D.  [address]  [place]  [place] [address]

## 2010-11-01 NOTE — H&P (Signed)
[name], PUNT NO.:  [id number]   MEDICAL RECORD NO.:  [id number]                   PATIENT TYPE:   LOCATION:                                       FACILITY:   PHYSICIAN:  Judeth Cornfield. Scot Dock, M.D.        DATE OF BIRTH:  1945/10/16   DATE OF ADMISSION:  05/04/2002  DATE OF DISCHARGE:                                HISTORY & PHYSICAL   CHIEF COMPLAINT:  Left carotid stenosis with left brain cerebrovascular  accident.   BRIEF HISTORY:  The patient is a 65 year old, white female who presented in  August, 2003, with acute onset of right lower extremity weakness which was  resolved quickly and right upper extremity weakness which persists to the  current time.  She underwent evaluation by Dr. Orson Ape, and Doppler study  shows severe left internal carotid artery stenosis with no right internal  carotid artery stenosis.  She was referred to [facility] and Dr. Deitra Mayo.  He has reviewed this information available on the patient and  examined her, and it was his opinion she would best be served with a left  carotid endarterectomy at this time.  The risks and benefits were discussed  in detail, and she was admitted electively for surgery at this time.   PAST MEDICAL HISTORY:  Past medical history includes hypertension for ten to  twelve years.  She also has a history of hemorrhoids.  She had a tubal  ligation in 1973, and she had some warts removed in 1995.   CURRENT MEDICATIONS:  1. Lopressor 50 mg p.o. q.d.  2. Univasc 15 mg q.d.  3. Plavix 75 mg p.o. q.d.  This was discontinued on May 03, 2002.  4. Aspirin 325 mg q.d.   ALLERGIES:  None known.   REVIEW OF SYSTEMS:  Weight stable, but she is still weak overall since her  stroke.  She denies any history of diabetes, kidney disease, asthma.  No  history of chronic obstructive pulmonary disease.  No syncopal or  presyncopal episodes.  No amaurosis.  No history of coronary artery  disease.  No history of myocardial infarction.  No history of pulmonary embolus, deep  venous thrombosis.  Gastrointestinal review of systems is negative.  She  denies dysuria, hematuria.  No history of reflux.  No history of congestive  heart failure.  She denies shortness of breath routinely but has dyspnea on  exertion, especially with going up stairs.  She denies paroxysmal nocturnal  dyspnea, orthopnea.  She does not have a productive cough at this time.   FAMILY HISTORY:  Mother died at age 50 or 67 with a myocardial infarction.  Her father died in his 14 with cancer.  She has two sisters; one sister  who has had a coronary artery bypass graft and a carotid, one sister has had  two valves replaced.  She has one brother who had a stroke  at 30 and two  with hypertension.   SOCIAL HISTORY:  She is married.  Her husband is disabled.  They have one  child.  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  She  currently smokes two packs a day and smoked for at least 26 years.  Alcohol:  None.   PHYSICAL EXAM:  GENERAL:  This is a well-nourished, overweight, white female  in no acute distress.  VITAL SIGNS:  Blood pressure is 128/80 in the right  arm and 120/80 in left arm.  Respirations were 18 and unlabored.  Pulse was  88 and regular.  HEAD:  Normocephalic.  EYES:  PERRLA. EOMs intact.  Fundi not visualized.  EARS, NOSE, THROAT, AND MOUTH:  Grossly within normal limits.  She is  edentulous.  CHEST:  No wheezes, no rales.  She did have some rhonchi.  No axillary or  supraclavicular lymphadenopathy.  HEART:  Normal S1 and S2, positive S4, no murmurs, rubs, or gallops.  ABDOMEN:  Soft, nontender, positive bowel sounds, obese, no palpable  organomegaly, no masses.  GENITAL URINARY/ RECTAL:  Deferred.  EXTREMITIES:  No clubbing, cyanosis, or edema.  Pulses are +2 in the  carotids.  Femorals, popliteal, dorsalis pedis, and posterior tibial are +1,  bilaterally.   NEUROLOGIC:  No focal deficits.  She continues to use a cane to walk, and  she has decreased strength in the right upper extremity.  She says she  frequently has trouble gripping things.  No other focal deficits.   IMPRESSION:  1. Left internal carotid artery stenosis with right body hemiparesis, lower     extremity resolved, residual weakness right upper extremity.  2. Hypertension.  3. Chronic obstructive pulmonary disease with ongoing tobacco use.   PLAN:  Patient scheduled for left carotid endarterectomy in the a.m.     Lydia Guiles, P.A.                 Judeth Cornfield. Scot Dock, M.D.    WDJ/MEDQ  D:  05/04/2002  T:  05/04/2002  Job:  [id number]

## 2010-11-01 NOTE — Discharge Summary (Signed)
[name], [name]                            ACCOUNT NO.:  [id number]   MEDICAL RECORD NO.:  [id number]                   PATIENT TYPE:  INP   LOCATION:  3304                                 FACILITY:  [facility]   PHYSICIAN:  Judeth Cornfield. Scot Dock, M.D.        DATE OF BIRTH:  10/04/1945   DATE OF ADMISSION:  05/06/2002  DATE OF DISCHARGE:  05/08/2002                                 DISCHARGE SUMMARY   PRIMARY CARE PHYSICIAN:  Leonides Grills, M.D., [place], [place].   DISCHARGE DIAGNOSES:  1. Extracranial cerebrovascular oclusive disease:  Severe left internal     carotid artery stenosis.  2. History of left cerebrovascular accident 8/03 with right-sided weakness.  3. Crescendo transient ischemic attacks with expressive aphasia on the     morning of the day of surgery 05/06/02.   SECONDARY DIAGNOSES:  1. Hypertension.  2. History of hemorrhoids.  3. Status post tubal ligation 1973.  4. History of tobacco habituation.   PROCEDURE:  Left carotid endarterectomy with dacron patch angioplasty  05/06/02 by Dr. Judeth Cornfield. Scot Dock.  The patient tolerated the procedure  well and was transferred stable and satisfactory condition to the recovery  room.   DISCHARGE DISPOSITION:  The patient was ready for discharge 05/08/02 on  postoperative day #2 after undergoing left carotid endarterectomy.  She was  maintained in the hospital an extra day over the usual discharge of  postoperative day #1, partly because she had been having transient ischemic  attacks just immediately prior to her surgery, and also because she was  hypotensive postoperatively and maintained on a dopamine drip overnight on  the day of surgery.  She did have some nausea postoperatively, but this was  resolved by the morning of postoperative day #1.  The patient did not  require any supplemental oxygen postoperatively.  She did not have any  cardiac dysthymias.  At the time of discharge she has full  bowel function,  taking oral nourishment well.  She has no neurologic deficits, moving upper  and lower extremities with appropriate strength and coordination.  Her  tongue is midline.  Her swallow is preserved.  She has not had any  expressive aphasia in the postoperative period.   DISCHARGE MEDICATIONS:  The patient goes home on the following medications:  1. Percocet 5/325 1-2 tablets p.o. q.4-6h. p.r.n. pain.  2. Lopressor 50 mg daily.  3. Univasc 15 mg daily.  4. Plavix 75 mg daily.  5. Aspirin 325 mg daily.   DISCHARGE DIET:  Low sodium low cholesterol diet.   DISCHARGE ACTIVITIES:  She is to walk daily to keep up her strength.  She is  asked not to drive for two weeks.   WOUND CARE:  She may shower beginning 05/08/02, Sunday the day after  discharge.   FOLLOWUP:  She will have a followup visit with Dr. Scot Dock on Thursday,  05/19/02 at 11:50 in the  morning.   BRIEF HISTORY:  The patient is a 65 year old female, she presented 8/03 with  acute onset of right lower extremity weakness, this resolved quickly,  however she does have persistent right upper extremity weakness.  She  underwent evaluation by Dr. Orson Ape and Doppler studies showed severe left  internal carotid artery stenosis.  There is no right internal carotid artery  stenosis.  She was referred to [facility] and Dr. Deitra Mayo, he examined  the patient and reviewed the available information, and it was his opinion  that she would be best served with left carotid endarterectomy.  The patient  understands the risks and benefits of the surgery, and is admitted  electively 05/06/02 for this surgery.  Note, as mentioned above, in the  holding area prior to surgery the patient had 2-3 episodes of expressive  aphasia, all intermittent, but of crescendo nature.  She was ushered to the  operating room where she underwent left carotid endarterectomy as soon as  possible by Dr. Deitra Mayo.     Sueanne Margarita, P.A.                    Judeth Cornfield. Scot Dock, M.D.    GM/MEDQ  D:  05/08/2002  T:  05/08/2002  Job:  [id number]

## 2010-11-01 NOTE — Op Note (Signed)
NAMERHYS, ANCHONDO NO.:  [id number]   MEDICAL RECORD NO.:  [id number]          PATIENT TYPE:  INP   LOCATION:  A320                          FACILITY:  [facility]   PHYSICIAN:  Felicie Morn, M.D. DATE OF BIRTH:  1946/05/02   DATE OF PROCEDURE:  04/11/2006  DATE OF DISCHARGE:                                 OPERATIVE REPORT   SURGEON:  Felicie Morn, M.D.   PREOPERATIVE DIAGNOSIS:  Acute cholecystitis, cholelithiasis.   POSTOPERATIVE DIAGNOSIS:  Acute cholecystitis, cholelithiasis.   PROCEDURE:  Laparoscopic cholecystectomy.   NOTE:  This is a 65 year old white female who essentially has been pretty  noncompliant about her GI complaints and has really not followed up despite  the fact that she has the diagnosis of Crohn's disease and in the past this  has been complicated with abscess that required catheter drainage.  She has  not followed up for colonoscopies.  She was admitted through the emergency  room with signs and symptoms of acute cholecystitis which was born out by  sonogram.  She had normal liver function studies with a slightly dilated  common bile duct which was noted to be essentially the same from her  previous studies. GI consult did not feel an ERCP was necessary  preoperatively.  We then cleared her for surgery medically which required a  carotid artery study as this patient had previously undergone a left carotid  endarterectomy when she had had a stroke previously.  We covered her with  Lovenox perioperatively.  She was taken to surgery on April 14, 2006,  after discussing the procedure in detail with her, discussing complications  not limited to but including bleeding, infection, damage to bile ducts,  perforation of organs, transitory diarrhea, and the possibility that open  surgery may be required.  Informed consent was obtained.   GROSS OPERATIVE FINDINGS:  The patient had an acutely inflamed gallbladder,  a short cystic duct  which was not cannulated for cholangiogram as it was  very short.  The rest of the right upper quadrant appeared to be grossly  within normal limits.  We did look at the small bowel which was sort of pale  looking and had essentially the look of chronic inflammatory bowel disease  in a quiescent stage.   TECHNIQUE:  The patient was placed in the supine position. After the  adequate administration of general anesthesia via endotracheal intubation,  her entire was prepped with Betadine solution and draped in the usual  manner.  Prior to this, a Foley catheter was aseptically inserted.  With the  patient Trendelenburg, a periumbilical incision was carried out over the  superior aspect of the umbilicus.  This fascia was elevated with a sharp  towel clip, a Veress needle was inserted, and confirmed in position with a  saline drop test.  We then insufflated the abdomen with approximately 3.5  liters of CO2 and we then placed three other cannulae, an mm cannula in the  epigastrium and two 5 mm cannulae in the right upper quadrant laterally.  The gallbladder was grasped and its  adhesions were taken down. The cystic  duct was clearly visualized, triply silver clipped and divided.  There was a  little leaking around the clips.  I then placed an Endoloop around the clips  to make sure that there would be no leaking from the cystic duct stump.  This was done without problems.  We then triply silver clipped the cystic  artery and removed the gallbladder with the hook cautery device  uneventfully.  We then irrigated with normal saline solution.  I elected to  leave a piece of Surgicel within the liver bed and a Jackson-Pratt drain  which exited from the 5 mm cannula site in the right upper quadrant  laterally.  After checking for hemostasis and irrigating, we then  desufflated the abdomen. I used 0.5% Sensorcaine around all port sites for  postoperative comfort.  I closed the fascia in the area of the  umbilicus and  epigastrium where the 11-mm cannula sites were placed.  We then closed all  incisions with surgical staples and sutured the drain in place with 3-0  nylon.  Prior to closure, all sponge, needle, and instrument counts were  found to be correct.  Estimated blood loss was minimal.  The patient  received 1200 mL of crystalloids interoperatively.  There were no  complications.      Felicie Morn, M.D.  Electronically Signed     WB/MEDQ  D:  04/14/2006  T:  04/14/2006  Job:  [id number]   cc:   Sherrilee Gilles. Gerarda Fraction, MD  Fax: [phone]   Caro Hight, M.D.  [address]  [place] , [place] [address]

## 2010-11-01 NOTE — Discharge Summary (Signed)
[name], PYTEL NO.:  [id number]   MEDICAL RECORD NO.:  [id number]          PATIENT TYPE:  INP   LOCATION:  A320                          FACILITY:  [facility]   PHYSICIAN:  Felicie Morn, M.D. DATE OF BIRTH:  07-Jul-1945   DATE OF ADMISSION:  04/11/2006  DATE OF DISCHARGE:  11/01/2007LH                                 DISCHARGE SUMMARY   DATE OF PROCEDURE:  April 14, 2006 - laparoscopic cholecystectomy.   CONSULTATIONS:  1. GI service.  2. Leonides Grills, M.D.   DIAGNOSIS:  Acute cholecystitis secondary to cholelithiasis.   SECONDARY DIAGNOSES:  1. History of Crohn's disease.  2. History of carotid artery stenosis with a cerebrovascular accident in      August of 2007.  3. Hypertension.  4. Arthritis.   NOTE:  This is a 65 year old white female who was admitted through the  emergency room with signs and symptoms of acute cholecystitis.  She was  admitted through the emergency room with the signs and symptoms of acute  cholecystitis.  Preoperatively, she was seen by the GI service, as she had a  history of Crohn's disease.  Also, prior to surgery she underwent evaluation  of her carotid arteries by Doppler studies, as she had had a previous  history of a left carotid endarterectomy.  When she was cleared medically  and by these studies and covered with Lovenox perioperatively, she was taken  to surgery on April 14, 2006 where an acutely inflamed gallbladder with  cholelithiasis was encountered.  A laparoscopic cholecystectomy was  feasible, and this as done without complications.  She did well  postoperatively, and her diet and activity were advanced as tolerated.  On  the second postoperative day, her wound was clean.  She was tolerating p.o.  well.  She had no leg pain, shortness of breath or any other untoward  changes, and minimal JP drainage, which was removed just prior to her  discharge.   LABORATORY DATA:  She had a sonogram which was  consistent for acute  cholecystitis secondary to cholelithiasis.  She had some old biliary  dilatation, which was noted preoperatively and on previous studies.  This  did not require any ERCP.  Postoperatively, she did well.  Her liver  function studies were mildly elevated, but they had a downward  postoperatively, and her bilirubin remained normal.  Her carotid  endarterectomy studies showed good flow without any significant stenosis.  Ultrasound of the gallbladder showed cholelithiasis with findings compatible  with acute cholecystitis, common bile duct dilatation, and minimal  pancreatic duct dilatation was noted.  A chest x-ray was negative for any  acute cardiac or pulmonary process.   Her hospital could was otherwise completely uneventful, and she was  discharged on the secondary postoperative day.   DISCHARGE INSTRUCTIONS:  She was discharged on a full liquid and soft diet.  She was told to refrain from milk products, greasy fried foods and  chocolate.  She was told to increase her activity as tolerated.  She was  permitted to shower, told to do no  heavy lifting, refrain from sexual  activity and no driving.  Told to clean her wound with alcohol two times a  day and change her drain site as needed.  She is to resume her preoperative  medications, and she was given Darvocet-N 100 one tablet every 4 hours a  needed for pain.   Followup arrangements were made for her on Monday, April 20, 2006 at 11  a.m.  She was given my phone number, and she was told to followup with Dr.  Laural Golden for the colonoscopy that she needs to have as an outpatient to  further evaluate her Crohn's disease, and she also is told to continue her  followup with Dr. Orson Ape medically.      Felicie Morn, M.D.  Electronically Signed     WB/MEDQ  D:  04/16/2006  T:  04/16/2006  Job:  [id number]   cc:   [name], M.D.  [address]  [place]  [place] [address]   Leonides Grills, M.D.  Fax:  [phone]

## 2010-11-01 NOTE — Consult Note (Signed)
NAMEEUGINA, ROW NO.:  [id number]   MEDICAL RECORD NO.:  [id number]          PATIENT TYPE:  EMS   LOCATION:  ED                            FACILITY:  [facility]   PHYSICIAN:  Felicie Morn, M.D. DATE OF BIRTH:  10/31/1945   DATE OF CONSULTATION:  04/11/2006  DATE OF DISCHARGE:                                   CONSULTATION   NOTE:  This is a 64 year old white female who came to the emergency room  with right upper quadrant pain.   HISTORY OF PRESENT MEDICAL ILLNESS:  She states that she has had this pain  in the past.  She has also had this particular pain for the last four days.  This is been unaccompanied with any nausea or vomiting.  She came and was  seen in the emergency room and a sonogram was obtained which revealed the  presence of acute cholecystitis with cholelithiasis.  The question was then  also raised about the possibility of having possible common bile duct  stones.   PHYSICAL EXAMINATION:  Discloses a pleasant 65 year old female in no acute  distress.  She is uncomfortable but in no acute distress.  She is 5 feet 3  inches and weighs 139 pounds.  She is afebrile with temperature 97.3, blood  pressure 95/69, heart rate 70, respirations 20 per minute.  O2 sat was 97%.  HEENT:  Head is normocephalic.  Extraocular movements are intact.  Pupils  were round and react to light and accommodation.  There is noted some  bilateral conjunctive pallor.  Nose and oral mucosa are moist.  The patient  is edentulous.  The patient has also had left carotid endarterectomy  surgery.  There are no bruits auscultated bilaterally.  There is no  adenopathy and thyromegaly.  Jugular veins are flat.  CHEST:  Clear both anterior and posterior to auscultation.  HEART:  Regular rhythm.  BREASTS AND AXILLA:  Without masses.  ABDOMEN:  The patient is tender in the right upper quadrant with some  guarding.  There are no masses palpated.  The patient has a periumbilical  incision consistent with a previous tubal ligation. No other surgical scars  are noted.  There are no femoral or inguinal herniae appreciated.  RECTAL:  The stool is guaiac negative.  EXTREMITIES:  Grossly within normal limits.   PAST HISTORY:  Surgical history as follows:  In May 2006, she had drainage  of an intra-abdominal abscess for Crohn's disease.  She has not undergone an  endoscopy, she was scheduled for one but has refused to follow up with Dr.  Melony Overly concerning this.  In 2003 she had a cerebrovascular accident that was  mild and affected her speech. She had a left carotid endarterectomy done at  that time.  She had a tubal ligation in the past.   REVIEW OF SYSTEMS:  OB/GYN:  The patient has no past history of breast  carcinoma in her family that she knows of. She has refused to have any  mammograms.  She is a gravida 4, para 1, abortus 39 female.  She is  menopausal with a history of tubal ligation as mentioned above.  GU:  No  history of nephrolithiasis or dysuria at present.  ENDOCRINE:  No history of  diabetes or thyroid disease.  NEURO:  The the patient has a history of CVA  related to carotid ischemia.  No history of seizures or migraines.  CARDIOVASCULAR:  As mentioned above, left carotid endarterectomy in 2003, no  past history of myocardial infarction.  The patient does smoke, she states  less than a pack of cigarettes per day and does not drink.  She has a  history of hypertension.  GI:  A history of Crohn's disease in the past.  She has seen Dr. Melony Overly for this.  However, she has been noncompliant  regarding endoscopies.  Present admission consistent with for acute  cholecystitis.  She has had no recent history of bright red rectal bleeding  or black tarry stools or any change in her bowel habits that are unusual.  She takes Pentasa for her Crohn's disease, 1 gram q.i.d.  She apparently has  been allergic to FLAGYL which gives her hallucinations in the past.    LABORATORY DATA:  She has a white count of 11.3 with an H&H of 12.9 and  37.7, platelet counts 287,0000, 79% neutrophils.  Metabolic seven is grossly  within normal limits with a BUN of 8, creatinine is 0.8.  Total protein 7.4  with albumin of 3.8.  Liver function studies are all grossly within normal  limits.  Total bilirubin is 0.5, alkaline phosphatase is 78.  Sonogram shows  some dilated common bile duct, possible choledocholithiasis, I doubt this  clinically.  She does have some cholecystitis and cholelithiasis by  sonography.   IMPRESSION:  1. Acute cholecystitis with cholelithiasis, possible choledocholithiasis.  2. History of Crohn's disease.  3..  Past history of cerebrovascular accident status post carotid  endarterectomy 2003.  1. History of hypertension.   MEDICATIONS:  She takes Lopressor 25 mg p.o. daily for hypertension as well  as lisinopril 5 mg p.o. daily and takes an aspirin 81 mg daily.   PLAN:  I will admit her. I will obtain a GI consult as Dr. Melony Overly has been  trying to follow up on her Crohn's disease and she has been negligent in  this.  I would also like his input regarding this choledocholithaisis read  on sonography which I do not see.  I will place her on clear liquids and  place her on antibiotics and we will follow her closely and plan for left  gallbladder surgery during this admission.   I discussed the laparoscopic procedure with her in detail, discussing the  possibility that open surgery might be required.  I also discussed other  complications including but not limited to bleeding, infection, damage to  bile ducts, perforation of organs, and transitory diarrhea.  Informed  consent was obtained.  This patient will be admitted to 3A.  Dr. Gerarda Fraction sees  her medically and Dr. Melony Overly.      Felicie Morn, M.D.  Electronically Signed     WB/MEDQ  D:  04/11/2006  T:  04/11/2006  Job:  [id number]  cc:   Sherrilee Gilles. Gerarda Fraction, MD  Fax: [phone]   [name], M.D.  [address]  [place]  [place] [address]

## 2010-11-01 NOTE — Discharge Summary (Signed)
NAMETERETHA, CHALUPA NO.:  [id number]   MEDICAL RECORD NO.:  [id number]          PATIENT TYPE:  INP   LOCATION:  A327                          FACILITY:  [facility]   PHYSICIAN:  Leonides Grills, M.D.DATE OF BIRTH:  September 30, 1945   DATE OF ADMISSION:  10/30/2004  DATE OF DISCHARGE:  05/22/2006LH                                 DISCHARGE SUMMARY   DISCHARGE DIAGNOSES:  1.  Abscess of the terminal ileum secondary to Escherichia coli.  2.  Crohn's disease newly diagnosed.  3.  History of hypertension.  4.  History of previous cerebrovascular accident.  5.  Endarterectomy.   HOSPITAL COURSE:  This 65 year old female was admitted through the emergency  room with abdominal pain, fever, chills, sweats, and lightheadedness.  The  patient had some abdominal tenderness.  She underwent a CT of the abdomen,  which showed marked bowel wall thickening associated with the terminal ileum  with stricture formation persistent with Crohn's disease.  The abscess was  3.1 x 4.8.  The patient was seen in consultation by Dr. Arnoldo Morale for surgery,  as well as Dr. Laural Golden from GI.  It was felt the patient was surgically  stable per her Crohn's disease.  The patient was treated with IV antibiotics  including Unasyn and Levaquin.  She also was treated with IV steroids.  The  patient improved dramatically throughout her hospital stay.  She underwent a  CT-guided abscess drainage with approximately 15 cc of clear material  obtained.  A repeat CT showed resolution of the abscess after drainage.  Cultures grew out E. coli.  The patient was tolerating a regular diet by the  time of discharge.  She will be continued on her Levaquin 750 a day for  seven days and prednisone taper over a period of month.  Potassium 1 g  q.i.d.  She is to eat yogurt with each meal.  She is to continue her  previous medicines, which were Lopressor and lisinopril.   FOLLOW UP:  With GI in three weeks __________ as  needed.      WMM/MEDQ  D:  11/04/2004  T:  11/04/2004  Job:  [id number]

## 2010-11-01 NOTE — H&P (Signed)
[name], [name] NO.:  [id number]   MEDICAL RECORD NO.:  [id number]          PATIENT TYPE:  INP   LOCATION:  A319                          FACILITY:  [facility]   PHYSICIAN:  Sherrilee Gilles. Gerarda Fraction, MD  DATE OF BIRTH:  December 21, 1945   DATE OF ADMISSION:  09/01/2004  DATE OF DISCHARGE:  LH                                HISTORY & PHYSICAL   CHIEF COMPLAINT:  Abdominal pain.   HISTORY OF PRESENT ILLNESS:  Two days prior to her admission, the patient  states she went to a local diner, had some food, and developed abdominal  pain, diarrhea and vomiting.  She states that she had no fevers, rigors or  chills, no hematemesis, hematochezia or melena.  She had her stomach settle  down in terms of vomiting and diarrhea, however, the pain persisted.  Twenty-  four hours lateral (24 hours prior to admission), the patient developed  severe increase in pain.  She was unable to tolerate any oral intake with  the exception of a cracker and some clear liquids.  She did not eat much and  then subsequently presented to the emergency room with an increase in pain.   PAST MEDICAL HISTORY:  1.  Status post carotid endarterectomy for severe carotid artery stenosis on      the left side.  2.  Status post cerebrovascular accident with minimal sequela present.  3.  Hypertension maintained on beta-blocker, Univasc with previous Plavix      treatment discontinued and maintained on aspirin q.d.   SOCIAL HISTORY:  She is recently widowed as of 1 month ago.  Positive  smoker, negative alcohol or other drug use.   FAMILY HISTORY:  Mother died at age 66 with myocardial infarction.  Father  died in 56s with cancer.  She has two sisters, one has coronary artery  bypass grafting and carotid endarterectomy.  Another sister has had two  valves replaced.  She also has a brother who had a stroke at age 43 and two  siblings with hypertension.   REVIEW OF SYSTEMS:  Under HPI, all else negative.   PHYSICAL  EXAMINATION:  SKIN:  Unremarkable.  HEENT:  No JVD or adenopathy.  NECK:  Supple.  CHEST:  Clear.  CARDIAC:  Regular rate and rhythm without murmurs, rubs or gallops.  ABDOMEN:  Positive left mesogastric, left lower quadrant and right lower  quadrant abdominal tenderness with no guarding or rebound.  No organomegaly  or masses.  Maximum tenderness appears to be in the right lower quadrant and  suprapubic area.  EXTREMITIES:  Without clubbing, cyanosis or edema.  NEUROLOGIC:  Nonfocal.   LABORATORY DATA AND X-RAY FINDINGS:  CT of her abdomen reveals bowel wall  thickening, mostly small bowel with an appearance of either regional  enteritis, possibly ischemic bowel versus inflammatory bowel disease  changes.  No obstruction and no perforation identified by CT report.  Remainder of her laboratories are unremarkable.   ASSESSMENT/PLAN:  1.  Abdominal pain, rule out ischemic bowel.  Computed tomography      angiography will be  obtained today.  2.  Incidental gallbladder versus stone.  We will get an ultrasound to      confirm this and better delineate the pathology.  3.  Hypertension.  Monitor and intervene as needed.  4.  Status post cerebrovascular accident, stable neurologically at present.      LJF/MEDQ  D:  09/02/2004  T:  09/02/2004  Job:  [id number]

## 2010-11-01 NOTE — Discharge Summary (Signed)
[name], ETHERIDGE NO.:  [id number]   MEDICAL RECORD NO.:  [id number]          PATIENT TYPE:  INP   LOCATION:  A319                          FACILITY:  [facility]   PHYSICIAN:  Sherrilee Gilles. Gerarda Fraction, MD  DATE OF BIRTH:  Sep 18, 1945   DATE OF ADMISSION:  09/01/2004  DATE OF DISCHARGE:  03/21/2006LH                                 DISCHARGE SUMMARY   DISCHARGE DIAGNOSES:  1.  Enteritis, question etiology.  2.  Hemoccult positive stool.  3.  Arteriosclerotic cardiovascular disease.  4.  Cholelithiasis.  5.  Hypertension, well controlled.   DISCHARGE MEDICATIONS:  1.  Protonix 40 mg p.o. b.i.d.  2.  Lopressor 50 mg p.o. q. day.  3.  Zestril 10 mg p.o. q. day.  4.  Cipro 500 mg p.o. b.i.d.  5.  Flagyl 500 mg p.o. q.i.d.   SUMMARY:  The patient was admitted with abdominal pain and CT showing a  possible regional enteritis consistent either with ischemic bowel or  inflammatory bowel disorder. She was seen in consultation by GI. Studies  revealed incidental cholelithiasis with gallbladder wall thickening.  However, with normal LFTs and negative clinical symptoms of cholecystitis  were present. She responded extremely quickly to IV fluids and IV steroids  were initially started. She was noted to have Hemoccult positive stool,  though her H&H remained stable in house. She was advised strongly to have a  colonoscopic exam and possible EGD examination, as well by myself, as well  as gastroenterology. This will be arranged as an outpatient. She is also  informed regarding gallbladder symptoms and should she develop anything  suggestive of same, she will contact me immediately.   DISCHARGE CONDITION:  Excellent.      LJF/MEDQ  D:  09/03/2004  T:  09/03/2004  Job:  [id number]

## 2010-11-01 NOTE — Op Note (Signed)
[name], [name] NO.:  [id number]   MEDICAL RECORD NO.:  [id number]          PATIENT TYPE:  INP   LOCATION:  A327                          FACILITY:  [facility]   PHYSICIAN:  Jamesetta So, M.D.  DATE OF BIRTH:  1946/04/10   DATE OF PROCEDURE:  07/03/2006  DATE OF DISCHARGE:                               OPERATIVE REPORT   PREOPERATIVE DIAGNOSIS:  Crohn disease, intra-abdominal abscess.   POSTOPERATIVE DIAGNOSIS:  Crohn disease, intra-abdominal abscess.   PROCEDURE:  Resection of terminal ileum and cecum, ileocectomy   SURGEON:  Jamesetta So, M.D.   ANESTHESIA:  General endotracheal.   INDICATIONS:  The patient is a 65 year old white female who presents  with a recurrent episode of intra-abdominal abscess consistent with  perforated Crohn disease.  She has failed medical therapy, and now  presents to the operating room for a probable bowel resection.  The  risks and benefits of the procedure; including bleeding, infection, the  possible need for blood transfusion, and the possibility of recurrence  of Crohn disease were fully explained to the patient, who gave informed  consent.   PROCEDURE NOTE:  The patient was placed in the supine position.  After  induction of general endotracheal anesthesia, the abdomen was prepped  and draped using the usual sterile technique with Betadine.  Surgical  site confirmation was performed.   A midline incision was made from above the umbilicus to below the  umbilicus.  The peritoneal cavity was entered into without difficulty.  The nasogastric tube was noted be its appropriate position in the  stomach.  The liver was inspected and noted to normal limits.  The  gallbladder was noted to be absent.  The small bowel was inspected; and  in the distal 2-3 feet of small intestine, an old inflamed abscess  cavity was present.  This was attached down to the posterior aspect of  the abdominal wall and extended into the pelvis.   Inflammation of the  right ovary and fallopian tube were also noted.  In addition, this was  attached to the superior portion of the bladder. The bladder wall was  bluntly removed from this area without difficulty.   A GIA stapler was placed across the mid ileum and fired.  This was  likewise done to the mid ascending colon.  Care was taken to avoid the  right colic artery.  The terminal ileum, cecum, and mesentery was then  divided using the harmonic scalpel.  Any large vessels were suture  ligated using 2-0 silk ligatures.  The specimen was then removed from  the operative field.  A side-to-side ileocolic anastomosis was then  performed using a GIA-70 stapler.  This enterotomy was closed using a TA-  60 stapler.  Staple line was bolstered using 3-0 silk Lambert sutures.  The mesenteric defect was closed using a 2-0 chromic gut running suture.  The bowel was then returned into the abdominal cavity in an orderly  fashion.  Of note, was the fact that any gross disease was removed.   The abdomen was copiously irrigated  with gentamicin normal saline.  The  fascia was reapproximated using a looped #0 Novofil running suture.  The  subcutaneous layer was closed using 2-0 Vicryl interrupted sutures.  The  skin was closed using staples.  Betadine ointment and dry sterile  dressings were applied.   All tape and needle counts were correct at the end of the procedure.  The patient was extubated in the operating room and went back to  recovery room awake in stable condition.   COMPLICATIONS:  None.   SPECIMEN:  Terminal ileum and cecum.   BLOOD LOSS:  250 mL      Jamesetta So, M.D.  Electronically Signed     MAJ/MEDQ  D:  07/03/2006  T:  07/03/2006  Job:  [id number]   cc:   R. Garfield Cornea, M.D.  [address]  [place]   [id number]

## 2010-11-01 NOTE — Consult Note (Signed)
NAMEMATTISYN, CARDONA NO.:  [id number]   MEDICAL RECORD NO.:  [id number]          PATIENT TYPE:  INP   LOCATION:  A320                          FACILITY:  [facility]   PHYSICIAN:  Caro Hight, M.D.      DATE OF BIRTH:  [birth year]   DATE OF CONSULTATION:  04/12/2006  DATE OF DISCHARGE:                                   CONSULTATION   REASON FOR CONSULTATION:  Dilated common bile duct.   HISTORY OF PRESENT ILLNESS:  Ms. Chamber is a 65 year old female with a past  medical history of Crohn's disease. She presented to the emergency  department on October 27 with abdominal pain. Her hepatic function panel in  March 2006 showed an alk phos 91, total bili 0.5. AST 17, ALT 11. Albumin 3.  Total protein 6.1. She had a CT scan of the abdomen and pelvis in May 2006  which showed dilated common bile duct of approximately 1.2 cm tapering to  approximately 0.7 cm in the head of the pancreas without evidence of stones.  It was associated with a normal hepatic function panel except for an albumin  2.6. Her hepatic function panel and presentation showed a total bili 0.5 and  alk phos 78. AST 19, ALT 12. Total protein 7.4. Albumin 3.8. Ultrasound  showed cholelithiasis with gallbladder wall thickening consistent with acute  cholelithiasis. A common bile duct was 12 mm in diameter. She had no  intrahepatic biliary dilatation or choledocholithiasis. Minimal mild  fullness of the pancreatic duct was noted, but the remainder of the  pancreatic duct was unremarkable.   Ms. Boch has had an intentional weight loss because she has been watching  her diet due to her diagnosis of Crohn's disease. She denies any nausea,  vomiting or jaundice. She has 1-2 formed bowel movements daily. She only  sees blood in her stool when her hemorrhoids are flaring. Her appetite has  been pretty good. She says her pain has been fairly well-controlled. She  quit drinking alcohol 10 years ago. She used to drink  daily until she got  loaded. She did this for two years. She denies any history of pancreatitis.   PAST MEDICAL HISTORY:  1. Abdominal abscess.  2. CVA in August 2007.  3. Carotid artery stenosis.  4. Hypertension.  5. Arthritis.   PAST SURGICAL HISTORY:  1. Tubal ligation.  2. Carotid endarterectomy.  3. D&C.   ALLERGIES:  Flagyl.   MEDICATIONS:  1. Lovenox.  2. Cipro.  3. Lisinopril.  4. Mesalamine.  5. Metoprolol.  6. Protonix.  7. Dilaudid p.r.n.  8. Zofran p.r.n.   FAMILY HISTORY:  She denies any family history of liver, pancreatic or  gallbladder cancer.   SOCIAL HISTORY:  She still has no health insurance. She has one daughter who  is disabled and lives with her. She denies any alcohol use currently.   REVIEW OF SYSTEMS:  Per the HPI. Otherwise, all systems are negative.   PHYSICAL EXAMINATION:  VITAL SIGNS:  T-max 81.2, systolic blood pressure  751/700.  GENERAL:  She is in  no apparent distress, alert and oriented x4.  HEENT:  Atraumatic, normocephalic. Pupils are equal, round, reactive to  light. Mouth:  No oral lesions. Posterior oropharynx without erythema or  exudates.  NECK:  Full range of motion, no lymphadenopathy.  LUNGS:  Clear to auscultation bilaterally.  CARDIOVASCULAR:  Regular rhythm, no murmur. Normal S1 and S2.  ABDOMEN:  Bowel sounds are present. Soft, nontender, nondistended. No  rebound or guarding.  EXTREMITIES:  Without cyanosis, clubbing or edema.  NEUROLOGICAL:  She has no focal neurological deficit.   LABORATORY DATA:  White count 5.9, hemoglobin 10.2, platelets 188.  Creatinine 0.8. Albumin 2.8. AST 17, ALT 9. Total bili 0.3.   ASSESSMENT:  Ms. Scharf is a 65 year old female with dilated common bile duct  which is unchanged from May 2006. Her dilated common bile duct is likely  secondary to a distal biliary stricture due to a focal pancreatitis in the  head of the pancreas secondary to alcohol abuse. The differential diagnosis   includes benign papillary stenosis and a low likelihood of a small  pancreatic head cancer. She may also have an ampullary adenoma. Thank you  for allowing me to see Ms. Pomerleau in consultation. My recommendations follow.   RECOMMENDATIONS:  1. I recommend endoscopic ultrasound after recovery from her laparoscopic      cholecystectomy. She will likely need an ERCP in the future if      gallstones are seen or a distal common bile duct stricture is      identified.  2. Continue Pentasa to manage her Crohn's disease.  3. She needs an outpatient colonoscopy for screening. The patient has been      unable to have the procedure done because she can't afford to have it      done and doesn't have the money.  4. She may follow up with Dr. Laural Golden, Neil Crouch, P.A., or Les Pou, N.P. in 4-6 weeks.      Caro Hight, M.D.  Electronically Signed     SM/MEDQ  D:  04/13/2006  T:  04/13/2006  Job:  [id number]   cc:   Felicie Morn, M.D.  Fax: [phone]

## 2010-11-01 NOTE — Consult Note (Signed)
[name], [name]                  ACCOUNT NO.:  [id number]   MEDICAL RECORD NO.:  [id number]          PATIENT TYPE:  INP   LOCATION:  G644                          FACILITY:  [facility]   PHYSICIAN:  R. Garfield Cornea, M.D. DATE OF BIRTH:  1945-12-23   DATE OF CONSULTATION:  DATE OF DISCHARGE:                                   CONSULTATION   REFERRING PHYSICIAN:  Sherrilee Gilles. Gerarda Fraction, M.D.   REASON FOR CONSULTATION:  Acute enteritis.   HISTORY OF PRESENT ILLNESS:  [name] is a 65 year old Caucasian female who  went out to eat lunch approximately three days ago.  About 30 minutes  postprandially, she began to develop severe, crampy, bilateral low abdominal  pain.  This was followed by several episodes of loose, watery, diarrheal  stools that were nonbloody and without any melena.  She also complained of  fever and low grade chills.  The following day she continued to have  intermittent abdominal pain and constant diarrhea.  Sunday morning she  presented and was admitted to [facility].  She had nausea  throughout this course as well.  She did have a small amount of emesis once  on Sunday evening.  She denied any jaundice or rash.  She denied any upper  abdominal pain.  She denied any new medications, recent travel, any ill  contacts, or any new pets.  She was noted to have white blood cell count of  17,000 on admission.  She had a CT scan of abdomen and pelvis which revealed  bi wall thickening, mostly small bowel with thickening of the terminal  ileum.  She was also found to have sigmoid diverticulosis.  Incidental note  was made of gallbladder stone versus polyp.  This was followed by abdominal  ultrasound which revealed the gallbladder was slightly distended with mild  wall thickening 4.5 mm.  There was a small stone and a phrygian cap noted.  Her CBD was found to be dilated up to about 10 mm.  She was noted to have  fatty liver as well.  Because of her significant peripheral  vascular disease  including carotid stenosis and CVA, she had a CT angiogram to rule out  mesenteric ischemia.  There was no significant findings to suggest acute  mesenteric ischemia, although she did have an atheromatous abdominal aorta  with osteal stenosis in the __________right renal common middle and inferior  left renal arteries.   PAST MEDICAL HISTORY:  1.  CVA in August of 2003.  2.  High grade left carotid stenosis, status post carotid endarterectomy.  3.  Hypertension.  4.  Tubal ligation.  5.  Hemorrhoids.  6.  D&C x4.  7.  Arthritis.  8.  Removal of genital wart.  9.  She has never had a colonoscopy.   MEDICATIONS PRIOR TO ADMISSION:  1.  Aspirin 325 mg daily.  2.  Two blood pressure pills that she does not know the names of.  3.  Aleve every couple days.  4.  Tylenol p.r.n.   ALLERGIES:  No known drug allergies.  FAMILY HISTORY:  No known family history of inflammatory bowel disease,  colorectal carcinoma, liver, or chronic GI problems.  Mother deceased, age  20, secondary to MI.  Father deceased in his 89s secondary to cancer of  unknown etiology.  She had multiple siblings with history significant for  CVA, hypertension, coronary artery disease.  One daughter, who has mental  health issues.   SOCIAL HISTORY:  [name] lost her husband of 30 years to COPD about a  month ago.  She has one daughter who is 66 years old, currently on  disability and lives with her.  She reports a 24-year history of tobacco  use, smoking anywhere from a pack and a half to two packs a day.  She denies  any alcohol or drug use.   REVIEW OF SYSTEMS:  CONSTITUTIONAL:  Weight is up about 20 pounds in the  last two years.  She is complaining of some fatigue, did have some fever and  chills over the weekend.  She denies any early satiety or anorexia.  CARDIOVASCULAR:  Denies any chest pain or palpitations.  PULMONARY:  Denies  any shortness of breath, dyspnea, cough, or hemoptysis.   GASTROINTESTINAL:  See  HPI.  She does have occasional heartburn about once or twice a month,  usually when she eats greasy foods.  She does not take anything for this.  She denies any dysphagia or odynophagia.  Denies any epigastric pain.  GENITOURINARY:  Denies any dysuria, hematuria, increased urinary frequency.  PSYCHIATRIC/SOCIAL:  She does report some slight depression, __________since  the loss of her husband which has been situational.   PHYSICAL EXAMINATION:  VITAL SIGNS:  Temperature 97.6, pulse 80,  respirations 20, blood pressure 99/62, height 63 inches, weight 160.8  pounds.  GENERAL:  [name] is a 65 year old Caucasian female who is alert,  oriented, pleasant, and cooperative, and sitting supine in the bed.  She is  in no acute distress.  HEENT:  Sclerae clear, nonicteric.  Conjunctivae pink.  Oropharynx pink and  moist without lesions.  NECK:  Supple without any mass or thyromegaly.  HEART:  Regular rate and rhythm with normal S1/S2.  She does have a 2/6  murmur noted.  No clicks, rubs, or gallops.  LUNGS:  Clear to auscultation bilaterally.  ABDOMEN:  Positive bowel sounds x4.  No bruits auscultated.  Soft,  nondistended.  She does have mild left lower quadrant tenderness on deep  palpation as well as suprapubic tenderness on deep palpation without any  rebound or guarding.  There is no hepatosplenomegaly or masses palpated.  There is negative Murphy's sign.  EXTREMITIES:  2+ pulses bilaterally.  No edema.  SKIN:  Pink, warm, and dry without any rash or jaundice.   LABORATORY DATA:  WBC is 12.2, hemoglobin 12.7, hematocrit 36.6, platelets  340, sedimentation rate 47.  Calcium 87, sodium 131, potassium 4.7, chloride  98, CO2 25, BUN 9, creatinine 0.9, glucose 149.  Lipase 19, total bilirubin  0.5, direct 0.1, alkaline phosphatase 93.  SGOT 18, SGPT 15.  Total protein  6.1, albumin 3.  Urinalysis positive for small amount of bilirubin and her  fecal occult blood test  was positive.   IMPRESSION:  1.  [name] is a 65 year old Caucasian female with acute onset of large      volume diarrhea for the last three days.  She also notes intermittent      cramping, abdominal pain, nausea, and vomiting x1.  She notes her  diarrhea is nonbloody.  She is a vasculopath and she has had a CTA which      did not reveal any major mesenteric significant stenosis, although there      were some changes there that we cannot rule out mesenteric ischemia.      Given the fact that her diarrhea is nonbloody, this is not very likely.      On CT scan she was found to have small bowel thickening, especially at      the terminal ileum which is nonspecific.  Most likely given her      scenario, she has developed food borne illness, given her history and      benign course.  Of course ischemia is less likely.  2.  Incidental note is made of gallbladder wall thickening.  Stone and CBD      dilatation of about 10 mm which is significant given her age.  She will      need follow-up, although these findings are not necessarily associated      with her current symptomatology.  LFTs however, have been normal.  She      denies any nausea or vomiting.  She denies any upper abdominal pain.      She is going to need possible MRCP to further evaluate this.   PLAN:  1.  Agree with IV fluids.  2.  We can taper her off her Solu-Medrol, then discontinue it in the next      day or so, as we do not feel this is inflammatory bowel disease.  3.  Will do a complete course of Cipro and Flagyl but change it to p.o. and      Levaquin can be discontinued.  4.  Low residue diet.  5.  She is going to need an outpatient colonoscopy with terminal ileoscopy      in the next week or two unless there is no improvement and she is going      to need that sooner.   We would like to thank Dr. Gerarda Fraction for allowing [place] to participate in the care  of [name].      KC/MEDQ  D:  09/02/2004  T:  09/02/2004   Job:  [id number]   cc:   R. Garfield Cornea, M.D.  [address]  [place]  [place] [address]   Lawrence J. Gerarda Fraction, MD  [address] [address]  Fax: [phone]

## 2010-11-01 NOTE — Consult Note (Signed)
[name], [name]                            ACCOUNT NO.:  [id number]   MEDICAL RECORD NO.:  [id number]                   PATIENT TYPE:  INP   LOCATION:  NA                                   FACILITY:  [facility]   PHYSICIAN:  Catherine A. Jacolyn Reedy, M.D.          DATE OF BIRTH:  January 23, 1946   DATE OF CONSULTATION:  05/06/2002  DATE OF DISCHARGE:                                   CONSULTATION   NEUROLOGY CONSULTATION:   CHIEF COMPLAINT:  [place] TIAs.   HISTORY OF PRESENT ILLNESS:  [name] is a 65 year old right-handed white  woman with history of left brain stroke with asphasia and right hemiparesis  in 8/03, treated at [facility]. She made a fairly good recovery  except for mild residual apraxia and aphasia by her description. The workup  revealed that she had a tight stenosis of the left carotid artery. She was  admitted this morning to short stay to have left carotid endarterectomy this  afternoon. While she was awaiting surgery, her nurse witnessed 2 events, one  lasting perhaps 5 minutes, the other 10, which seemed to consist of language  deficit and confusion, trouble thinking of words. She has a baseline lisp  secondary to edentulous state, so I doubt believe we are talking about  slurred speech. The nurse described that she seemed to be having trouble  thinking of what she wanted to say, which sounds more like aphasia. These  resolved back to her baseline.   REVIEW OF SYMPTOMS:  Positive for complaints of arthritis pain and some  residual right-sided weakness and difficulty performing tasks, consistent  with apraxia, and some trouble thinking of words, consistent with aphasia.   PAST MEDICAL HISTORY:  Significant for stroke in the left brain 8/03 at  [facility], high grade left carotid stenosis. She also has hypertension,  hemorrhoids, and remote tubal ligation.   MEDICATIONS:  1. Lopressor 50 mg a day.  2. Univasc 15 mg daily.  3. Aspirin 325 mg a day.  4.  Plavix 75 mg a day, and this has been on hold since 11/18.   ALLERGIES:  No known drug allergies.   SOCIAL HISTORY:  She is still smoking two packs per day. No alcohol. She is  married.   FAMILY HISTORY:  Positive for coronary artery disease.   PHYSICAL EXAMINATION:  VITAL SIGNS:  Temperature 97.1, pulse 72,  respirations 20, blood pressure 142/80.  HEAD:  Normocephalic, atraumatic. She is edentulous.  NECK:  Supple without bruits.  HEART:  Regular, rate, and rhythm.  LUNGS:  Clear to auscultation.  EXTREMITIES:  Without edema. She has nicotine stains on her fingers.  NEUROLOGIC EXAM:  She is awake and alert, appropriate; will name objects,  follow commands, repeat a simple sentence, has more trouble with a complex  sentence but what might be considered telegraphic words; for example, no  ifs, ands, or buts. Cranial  nerves:  Pupils are equal and reactive. Visual  fields are full to confrontation. Extraocular movements are intact. Facial  sensation is normal. Facial motor activity is intact. Palate is symmetric,  and tongue is midline. She does not have dysarthria, but she is edentulous  and so is mumbling and lisping. Hearing is intact. Motor exam:  There is  drift or _________. Normal bulk, tone, and strength throughout upper and  lower extremities. Reflexes:  I cannot elicit. Deep tendon reflexes:  She  has downgoing dorsoplantar stimulation. Coordination:  Finger-to-nose, heel-  to-shin intact. Sensory exam  intact.   IMPRESSION:  Left carotid stenosis, appears at baseline with minimal  residual right apraxia, slight word finding errors, repetition problems. The  lisp, I think, is secondary to being edentulous and not a dysarthria. She  had some apparent TIAs earlier with increase in left brain symptomatology,  but there is no evidence of it currently.   PLAN:  Proceed with surgery after delay and consider heparin.                                               Catherine A.  Jacolyn Reedy, M.D.    CAW/MEDQ  D:  05/06/2002  T:  05/06/2002  Job:  [id number]

## 2010-12-10 NOTE — Discharge Summary (Signed)
  [name], BAKER NO.:  [id number]  MEDICAL RECORD NO.:  [id number]  LOCATION:  A326                          FACILITY:  [facility]  PHYSICIAN:  Tilford Pillar, MD      DATE OF BIRTH:  January 16, 1946  DATE OF ADMISSION:  09/26/2010 DATE OF DISCHARGE:  04/15/2012LH                              DISCHARGE SUMMARY   ADMISSION DIAGNOSIS:  Abdominal pain.  DISCHARGE DIAGNOSES: 1. Resolution of abdominal pain. 2. History of hypertension. 3. Arthritis. 4. Crohn disease. 5. History of cerebrovascular accident.  DISPOSITION:  Home.  BRIEF HISTORY AND PHYSICAL:  Please see the admission history and physical for complete H and P.  The patient is a 65 year old female who presented with diffuse abdominal pain, nausea, and vomiting to [facility].  She was admitted for continued management, intervention, and workup of her abdominal pain.  HOSPITAL COURSE:  The patient was admitted on September 26, 2010, for abdominal pain, pain control, IV fluid, and bowel rest.  At this point, it was suspected that the symptomatology was related to her Crohn disease.  Her abdomen remained nonsurgical and she did begin to have improvement of her symptomatology with improving pain and resolution of her nausea.  She was eventually resumed back on a diet.  She was advanced to a regular diet.  Her pain was controlled and she was advised to follow up with her primary gastroenterologist.  She was discharged home on September 29, 2010.  DISCHARGE INSTRUCTIONS:  She is to continue a low-residual diet.  She is to call should she have any questions or concern and is to return back to the emergency department.  She is to return in followup with her primary gastroenterologist for continued Crohn management.     Tilford Pillar, MD    BZ/MEDQ  D:  12/05/2010  T:  12/06/2010  Job:  [id number]  Electronically Signed by Tilford Pillar MD on 12/10/2010 05:46:15 PM

## 2010-12-30 ENCOUNTER — Encounter (INDEPENDENT_AMBULATORY_CARE_PROVIDER_SITE_OTHER): Payer: Self-pay

## 2011-01-13 ENCOUNTER — Encounter (INDEPENDENT_AMBULATORY_CARE_PROVIDER_SITE_OTHER): Payer: Self-pay | Admitting: Internal Medicine

## 2011-01-13 ENCOUNTER — Ambulatory Visit (INDEPENDENT_AMBULATORY_CARE_PROVIDER_SITE_OTHER): Payer: Medicare Other | Admitting: Internal Medicine

## 2011-01-13 VITALS — BP 92/54 | HR 72 | Temp 97.6°F | Ht 62.5 in | Wt 141.5 lb

## 2011-01-13 DIAGNOSIS — K5090 Crohn's disease, unspecified, without complications: Secondary | ICD-10-CM

## 2011-01-13 NOTE — Progress Notes (Addendum)
Subjective:     Patient ID: [name], female   DOB: 1945/08/30, 65 y.o.   MRN: [id number]  HPIGail is a 65 yr old white female presenting today for f/u of her Crohn's disease.  She tells me she is doing good. Her appetite is okay.  She has lost about 8 pounds since her last visit. She has been mowing her yard this summer and attributes her weight loss to this.  No nausea or vomiting.  No abdominal pain. She is having a BM once a day.  No rectal bleeding or melena.  No acid reflux.  No fevers.    She tells me for the most part she she feels good.    In January of 2008 she underwent a resection of the terminal ileum and cecum, ileocectomy.  [name] presented with recurrent episode of intra-abdominal abscess with perforated Crohn's disease.  Biopsy revealed chronic active inflammation with ulceration and abscess formation consistent with Crohn's disease. Her last admission was to [facility] on September 26, 2010 for abdominal pain.  She underwent a CT of the abdomen and pelvis of 09/27/2010 which revealed a very large ventral hernia containing herniated stomach, small bowel and colon.  Partial small bowel obstruction.  This is most likely due to small bowel scarring and/or adhesions formation associated with patient's Crohn's disease and previous surgery. 01/01/2011: WBC ct 9.5. H and H 13.0 and 39.2, MCV 98.2, Sed rate 10.    Review of Systems      See hpi     Objective:   Physical ExamAlert and oriented. Skin warm and dry. Oral mucosa is moist. Endentuous.  Sclera anicteric, conjunctivae is pink. Thyroid not enlarged. No cervical lymphadenopathy. Lungs clear. Heart regular rate and rhythm.  Abdomen is soft. Bowel sounds are positive. No hepatomegaly. Large left abdominal hernia. No tenderness.  No edema to lower extremities. Patient is alert and oriented.      Assessment:    Crohn's disease which she appears to be in remission.   Plan:  She will follow in 6 months with a CBC and sed rate.

## 2011-03-27 LAB — CBC
HCT: 40
Hemoglobin: 13.8
RBC: 4.18
WBC: 10.4

## 2011-03-27 LAB — COMPREHENSIVE METABOLIC PANEL
ALT: 17
Alkaline Phosphatase: 65
BUN: 8
Chloride: 101
Glucose, Bld: 118 — ABNORMAL HIGH
Potassium: 3.9
Sodium: 135
Total Bilirubin: 0.6

## 2011-03-27 LAB — DIFFERENTIAL
Basophils Absolute: 0
Basophils Relative: 0
Eosinophils Absolute: 0.1
Monocytes Absolute: 0.5
Neutro Abs: 8.5 — ABNORMAL HIGH
Neutrophils Relative %: 82 — ABNORMAL HIGH

## 2011-05-31 ENCOUNTER — Encounter (HOSPITAL_COMMUNITY): Payer: Self-pay | Admitting: Emergency Medicine

## 2011-05-31 ENCOUNTER — Emergency Department (HOSPITAL_COMMUNITY): Payer: Medicare Other

## 2011-05-31 ENCOUNTER — Inpatient Hospital Stay (HOSPITAL_COMMUNITY)
Admission: EM | Admit: 2011-05-31 | Discharge: 2011-06-03 | DRG: 394 | Disposition: A | Discharge status: Home / Self Care | Payer: Medicare Other | Attending: Internal Medicine | Admitting: Internal Medicine

## 2011-05-31 DIAGNOSIS — K5090 Crohn's disease, unspecified, without complications: Secondary | ICD-10-CM | POA: Diagnosis present

## 2011-05-31 DIAGNOSIS — K436 Other and unspecified ventral hernia with obstruction, without gangrene: Principal | ICD-10-CM | POA: Diagnosis present

## 2011-05-31 DIAGNOSIS — F17200 Nicotine dependence, unspecified, uncomplicated: Secondary | ICD-10-CM | POA: Diagnosis present

## 2011-05-31 DIAGNOSIS — Z720 Tobacco use: Secondary | ICD-10-CM | POA: Diagnosis present

## 2011-05-31 DIAGNOSIS — K56609 Unspecified intestinal obstruction, unspecified as to partial versus complete obstruction: Secondary | ICD-10-CM

## 2011-05-31 DIAGNOSIS — I10 Essential (primary) hypertension: Secondary | ICD-10-CM | POA: Diagnosis present

## 2011-05-31 DIAGNOSIS — K439 Ventral hernia without obstruction or gangrene: Secondary | ICD-10-CM | POA: Diagnosis present

## 2011-05-31 HISTORY — DX: Sciatica, right side: M54.31

## 2011-05-31 HISTORY — DX: Unspecified osteoarthritis, unspecified site: M19.90

## 2011-05-31 HISTORY — DX: Other intervertebral disc degeneration, lumbar region: M51.36

## 2011-05-31 HISTORY — DX: Other intervertebral disc degeneration, lumbar region without mention of lumbar back pain or lower extremity pain: M51.369

## 2011-05-31 HISTORY — DX: Cerebral infarction, unspecified: I63.9

## 2011-05-31 HISTORY — DX: Ventral hernia without obstruction or gangrene: K43.9

## 2011-05-31 LAB — URINALYSIS, ROUTINE W REFLEX MICROSCOPIC
Bilirubin Urine: NEGATIVE
Leukocytes, UA: NEGATIVE
Nitrite: NEGATIVE
Specific Gravity, Urine: 1.01 (ref 1.005–1.030)
Urobilinogen, UA: 0.2 mg/dL (ref 0.0–1.0)
pH: 5.5 (ref 5.0–8.0)

## 2011-05-31 LAB — URINE MICROSCOPIC-ADD ON

## 2011-05-31 LAB — COMPREHENSIVE METABOLIC PANEL
ALT: 8 U/L (ref 0–35)
CO2: 26 mEq/L (ref 19–32)
Calcium: 9.3 mg/dL (ref 8.4–10.5)
Chloride: 99 mEq/L (ref 96–112)
GFR calc Af Amer: 85 mL/min — ABNORMAL LOW (ref >90)
GFR calc non Af Amer: 73 mL/min — ABNORMAL LOW (ref >90)
Glucose, Bld: 123 mg/dL — ABNORMAL HIGH (ref 70–99)
Sodium: 134 mEq/L — ABNORMAL LOW (ref 135–145)
Total Bilirubin: 0.4 mg/dL (ref 0.3–1.2)

## 2011-05-31 LAB — DIFFERENTIAL
Eosinophils Absolute: 0 10*3/uL (ref 0.0–0.7)
Eosinophils Relative: 0 % (ref 0–5)
Lymphocytes Relative: 10 % — ABNORMAL LOW (ref 12–46)
Lymphs Abs: 1.1 10*3/uL (ref 0.7–4.0)
Monocytes Relative: 7 % (ref 3–12)
Neutrophils Relative %: 83 % — ABNORMAL HIGH (ref 43–77)

## 2011-05-31 LAB — CBC
Hemoglobin: 13.6 g/dL (ref 12.0–15.0)
MCH: 33.3 pg (ref 26.0–34.0)
MCV: 97.8 fL (ref 78.0–100.0)
RBC: 4.08 MIL/uL (ref 3.87–5.11)
WBC: 11.2 10*3/uL — ABNORMAL HIGH (ref 4.0–10.5)

## 2011-05-31 MED ORDER — ONDANSETRON HCL 4 MG/2ML IJ SOLN
4.0000 mg | INTRAMUSCULAR | Status: DC | PRN
  Administered 2011-05-31: 4 mg via INTRAVENOUS
  Filled 2011-05-31: qty 2

## 2011-05-31 MED ORDER — ACETAMINOPHEN 650 MG RE SUPP: 650.0000 mg | Freq: Four times a day (QID) | RECTAL | Status: DC | PRN

## 2011-05-31 MED ORDER — NICOTINE 21 MG/24HR TD PT24
21.0000 mg | MEDICATED_PATCH | Freq: Every day | TRANSDERMAL | Status: DC
  Administered 2011-05-31 – 2011-06-03 (×4): 21 mg via TRANSDERMAL
  Filled 2011-05-31 (×4): qty 1

## 2011-05-31 MED ORDER — ONDANSETRON HCL 4 MG PO TABS: 4.0000 mg | ORAL_TABLET | Freq: Four times a day (QID) | ORAL | Status: DC | PRN

## 2011-05-31 MED ORDER — ACETAMINOPHEN 325 MG PO TABS
650.0000 mg | ORAL_TABLET | Freq: Four times a day (QID) | ORAL | Status: DC | PRN
  Administered 2011-06-01 – 2011-06-03 (×2): 650 mg via ORAL
  Filled 2011-05-31 (×2): qty 2

## 2011-05-31 MED ORDER — ONDANSETRON HCL 4 MG/2ML IJ SOLN
4.0000 mg | Freq: Four times a day (QID) | INTRAMUSCULAR | Status: DC | PRN
  Administered 2011-06-02 – 2011-06-03 (×3): 4 mg via INTRAVENOUS
  Filled 2011-05-31 (×3): qty 2

## 2011-05-31 MED ORDER — METOPROLOL TARTRATE 1 MG/ML IV SOLN
5.0000 mg | Freq: Four times a day (QID) | INTRAVENOUS | Status: DC
  Administered 2011-05-31: 5 mg via INTRAVENOUS
  Filled 2011-05-31 (×2): qty 5

## 2011-05-31 MED ORDER — HYDROMORPHONE HCL PF 1 MG/ML IJ SOLN: 0.5000 mg | INTRAMUSCULAR | Status: DC | PRN

## 2011-05-31 MED ORDER — IOHEXOL 300 MG/ML  SOLN
100.0000 mL | Freq: Once | INTRAMUSCULAR | Status: AC | PRN
  Administered 2011-05-31: 100 mL via INTRAVENOUS

## 2011-05-31 MED ORDER — SODIUM CHLORIDE 0.9 % IV SOLN: INTRAVENOUS | Status: DC

## 2011-05-31 MED ORDER — MORPHINE SULFATE 4 MG/ML IJ SOLN
4.0000 mg | INTRAMUSCULAR | Status: AC | PRN
Start: 2011-05-31 — End: 2011-05-31
  Administered 2011-05-31 (×2): 4 mg via INTRAVENOUS
  Filled 2011-05-31 (×2): qty 1

## 2011-05-31 MED ORDER — HYDROMORPHONE HCL PF 1 MG/ML IJ SOLN
0.5000 mg | INTRAMUSCULAR | Status: DC | PRN
  Administered 2011-06-01 – 2011-06-02 (×8): 0.5 mg via INTRAVENOUS
  Filled 2011-05-31 (×7): qty 1

## 2011-05-31 MED ORDER — ONDANSETRON HCL 4 MG/2ML IJ SOLN: 4.0000 mg | Freq: Three times a day (TID) | INTRAMUSCULAR | Status: DC | PRN

## 2011-05-31 MED ORDER — SODIUM CHLORIDE 0.9 % IV SOLN
INTRAVENOUS | Status: DC
  Administered 2011-05-31 (×2): via INTRAVENOUS
  Administered 2011-06-01: 1000 mL via INTRAVENOUS
  Administered 2011-06-01: 03:00:00 via INTRAVENOUS
  Administered 2011-06-02: 1000 mL via INTRAVENOUS

## 2011-05-31 NOTE — ED Notes (Signed)
Patient completed oral contrast. CT made aware.

## 2011-05-31 NOTE — ED Notes (Signed)
Report given to Erica, RN unit 300. Ready to receive patient. 

## 2011-05-31 NOTE — ED Notes (Signed)
Pt c/o lower back pain and nausea since yesterday.

## 2011-05-31 NOTE — ED Notes (Signed)
Pt returned from x-ray. Pt requested that her rail still be left down. Rail was left down per pt request. NAD noted at this time.

## 2011-05-31 NOTE — ED Notes (Signed)
Patient ambulated with steady gait to restroom to obtain urine sample.

## 2011-05-31 NOTE — ED Notes (Signed)
Pt requested a bedside commode and for one of her bed rails to be done. Pt states when she has to go urinate she will not be able to wait on [place]. Commode was placed bedside the bed, and rail was left down per pt's request. RN made aware. NAD noted at this time.

## 2011-05-31 NOTE — ED Provider Notes (Signed)
This chart was scribed for Alfonzo Feller, DO by Toniann Ket. The patient was seen in room APA06/APA06 and the patient's care was started at 8:48 AM.    CSN: [id number] Arrival date & time: 05/31/2011  7:34 AM   Chief Complaint  Patient presents with  . Back Pain  . Nausea   HPI Pt seen at 8:24 AM [name] is a 65 y.o. female who presents to the Emergency Department complaining of gradual onset and persistence of constant low back "pain," abd pain and nausea that began 2 days ago.  Denies fevers, no diarrhea, no vomiting, no CP/SOB.     PMD: Dr. Orson Ape GI:  Dr. Laural Golden Past Medical History  Diagnosis Date  . Crohn's disease   . Hypertension   . Stroke   . Arthritis   . Sciatica of right side   . Degenerative disc disease, lumbar   . Ventral hernia     Past Surgical History  Procedure Date  . Cholecystectomy     2008  . Carotid endarterectomy     2004  . Hemicolectomy     right  . Tubal ligation     Family History  Problem Relation Age of Onset  . GI problems Father   . Stroke Brother   . Heart attack Mother   . Hypotension Sister     History  Substance Use Topics  . Smoking status: Current Everyday Smoker -- 1.0 packs/day for 30 years  . Smokeless tobacco: Not on file  . Alcohol Use: No     one every 2 -3 months.    Review of Systems ROS: Statement: All systems negative except as marked or noted in the HPI; Constitutional: Negative for fever and chills. ; ; Eyes: Negative for eye pain, redness and discharge. ; ; ENMT: Negative for ear pain, hoarseness, nasal congestion, sinus pressure and sore throat. ; ; Cardiovascular: Negative for chest pain, palpitations, diaphoresis, dyspnea and peripheral edema. ; ; Respiratory: Negative for cough, wheezing and stridor. ; ; Gastrointestinal: +nausea, abd pain.  Negative for vomiting, diarrhea, blood in stool, hematemesis, jaundice and rectal bleeding. . ; ; Genitourinary: Negative for dysuria, flank pain and  hematuria. ; ; Musculoskeletal: +LBP.  Negative for neck pain. Negative for swelling and trauma.; ; Skin: Negative for pruritus, rash, abrasions, blisters, bruising and skin lesion.; ; Neuro: Negative for headache, lightheadedness and neck stiffness. Negative for weakness, altered level of consciousness , altered mental status, extremity weakness, paresthesias, involuntary movement, seizure and syncope.      Allergies  Flagyl  Home Medications   Current Outpatient Rx  Name Route Sig Dispense Refill  . ASPIRIN 81 MG PO TABS Oral Take 81 mg by mouth daily.      Marland Kitchen LEVOFLOXACIN 500 MG PO TABS Oral Take 500 mg by mouth daily.      Marland Kitchen LISINOPRIL 20 MG PO TABS Oral Take 20 mg by mouth daily. 1/2 TAB DAILY     . MESALAMINE 400 MG PO TBEC Oral Take 800 mg by mouth 2 (two) times daily.      Marland Kitchen METOPROLOL TARTRATE 100 MG PO TABS Oral Take 100 mg by mouth daily. 1/2 TAB DAILY     . NAPROXEN 500 MG PO TABS Oral Take 500 mg by mouth 2 (two) times daily as needed.       BP 135/96  Pulse 108  Temp(Src) 97.6 F (36.4 C) (Oral)  Resp 20  Ht 5' 2"  (1.575 m)  Wt 137  lb (62.143 kg)  BMI 25.06 kg/m2  SpO2 100%  Physical Exam 0830: Physical examination:  Nursing notes reviewed; Vital signs and O2 SAT reviewed;  Constitutional: Well developed, Well nourished, Well hydrated, In no acute distress; Head:  Normocephalic, atraumatic; Eyes: EOMI, PERRL, No scleral icterus; ENMT: Mouth and pharynx normal, Mucous membranes moist; Neck: Supple, Full range of motion, No lymphadenopathy; Cardiovascular: Regular rate and rhythm, No murmur, rub, or gallop; Respiratory: Breath sounds clear & equal bilaterally, No rales, rhonchi, wheezes, or rub, Normal respiratory effort/excursion; Chest: Nontender, Movement normal; Abdomen: Soft, +large ventral hernia, +abd diffuse tenderness to palp, no rebound or guarding, Nondistended, Normal bowel sounds; Genitourinary: No CVA tenderness; Spine:  No midline CS, TS, LS tenderness.  +mild  TTP bilat R>L lumbar paraspinal muscles. Extremities: Pulses normal, No tenderness, No edema, No calf edema or asymmetry.; Neuro: AA&Ox3, Major CN grossly intact.  No facial droop, speech clear.  No gross focal motor or sensory deficits in extremities.; Skin: Color normal, Warm, Dry, no rash.    ED Course  Procedures  MDM  MDM Reviewed: previous chart, nursing note and vitals Interpretation: labs, CT scan and x-ray   Results for orders placed during the hospital encounter of 05/31/11  CBC      Component Value Range   WBC 11.2 (*) 4.0 - 10.5 (K/uL)   RBC 4.08  3.87 - 5.11 (MIL/uL)   Hemoglobin 13.6  12.0 - 15.0 (g/dL)   HCT 39.9  36.0 - 46.0 (%)   MCV 97.8  78.0 - 100.0 (fL)   MCH 33.3  26.0 - 34.0 (pg)   MCHC 34.1  30.0 - 36.0 (g/dL)   RDW 12.5  11.5 - 15.5 (%)   Platelets 200  150 - 400 (K/uL)  DIFFERENTIAL      Component Value Range   Neutrophils Relative 83 (*) 43 - 77 (%)   Neutro Abs 9.2 (*) 1.7 - 7.7 (K/uL)   Lymphocytes Relative 10 (*) 12 - 46 (%)   Lymphs Abs 1.1  0.7 - 4.0 (K/uL)   Monocytes Relative 7  3 - 12 (%)   Monocytes Absolute 0.8  0.1 - 1.0 (K/uL)   Eosinophils Relative 0  0 - 5 (%)   Eosinophils Absolute 0.0  0.0 - 0.7 (K/uL)   Basophils Relative 0  0 - 1 (%)   Basophils Absolute 0.0  0.0 - 0.1 (K/uL)  LIPASE, BLOOD      Component Value Range   Lipase 21  11 - 59 (U/L)  COMPREHENSIVE METABOLIC PANEL      Component Value Range   Sodium 134 (*) 135 - 145 (mEq/L)   Potassium 4.3  3.5 - 5.1 (mEq/L)   Chloride 99  96 - 112 (mEq/L)   CO2 26  19 - 32 (mEq/L)   Glucose, Bld 123 (*) 70 - 99 (mg/dL)   BUN 10  6 - 23 (mg/dL)   Creatinine, Ser 0.82  0.50 - 1.10 (mg/dL)   Calcium 9.3  8.4 - 10.5 (mg/dL)   Total Protein 6.8  6.0 - 8.3 (g/dL)   Albumin 3.3 (*) 3.5 - 5.2 (g/dL)   AST 12  0 - 37 (U/L)   ALT 8  0 - 35 (U/L)   Alkaline Phosphatase 83  39 - 117 (U/L)   Total Bilirubin 0.4  0.3 - 1.2 (mg/dL)   GFR calc non Af Amer 73 (*) >90 (mL/min)   GFR calc  Af Amer 85 (*) >90 (mL/min)  LACTIC ACID, PLASMA  Component Value Range   Lactic Acid, Venous 1.2  0.5 - 2.2 (mmol/L)  URINALYSIS, ROUTINE W REFLEX MICROSCOPIC      Component Value Range   Color, Urine YELLOW  YELLOW    APPearance CLEAR  CLEAR    Specific Gravity, Urine 1.010  1.005 - 1.030    pH 5.5  5.0 - 8.0    Glucose, UA NEGATIVE  NEGATIVE (mg/dL)   Hgb urine dipstick TRACE (*) NEGATIVE    Bilirubin Urine NEGATIVE  NEGATIVE    Ketones, ur NEGATIVE  NEGATIVE (mg/dL)   Protein, ur NEGATIVE  NEGATIVE (mg/dL)   Urobilinogen, UA 0.2  0.0 - 1.0 (mg/dL)   Nitrite NEGATIVE  NEGATIVE    Leukocytes, UA NEGATIVE  NEGATIVE   URINE MICROSCOPIC-ADD ON      Component Value Range   Squamous Epithelial / LPF FEW (*) RARE    WBC, UA 0-2  <3 (WBC/hpf)   RBC / HPF 0-2  <3 (RBC/hpf)   Ct Abdomen Pelvis W Contrast  05/31/2011  *RADIOLOGY REPORT*  Clinical Data: Abdominal pain.  History of Crohn's.  Back pain. Prior cholecystectomy and multiple Crohn's surgeries.  History of skin cancer.  CT ABDOMEN AND PELVIS WITH CONTRAST  Technique:  Multidetector CT imaging of the abdomen and pelvis was performed following the standard protocol during bolus administration of intravenous contrast.  Contrast: 120m OMNIPAQUE IOHEXOL 300 MG/ML IV SOLN  Comparison: CT 09/27/2010  Findings: Lung bases are unremarkable.  Patient has had prior cholecystectomy.  There is dilatation of the common bile duct, within normal limits given the patient's prior surgery.  No focal abnormality identified within the liver, spleen, pancreas, adrenal glands, kidneys.  There is a large ventral hernia, to the left of midline.  Hernia contains dilated loops of small bowel in nondilated loops of large bowel.  However, the point of obstruction is best localized to the right lower quadrant, where there is enhancement of small bowel loops and change in caliber.  No mass identified in this region. Surgical clips are present in the right lower  quadrant consistent with previous Crohn disease surgery.  Multiple colonic diverticula are present.  CT evidence for acute diverticulitis.  No abscess.  No retroperitoneal or mesenteric adenopathy.  There is dense calcification of the abdominal aorta without aneurysm.  No evidence for adnexal mass. Degenerative changes are seen in the spine.  IMPRESSION:  1.  Large ventral hernia, containing dilated small bowel loops. 2.  Early or partial small bowel obstruction, best localized to adhesions in the right lower quadrant. 3.  Postoperative change in the right lower quadrant.  Original Report Authenticated By: EGlenice Bow M.D.   Dg Abd Acute W/chest  05/31/2011  *RADIOLOGY REPORT*  Clinical Data: Rule out small bowel obstruction.  Vomiting. History of Crohn disease.  ACUTE ABDOMEN SERIES (ABDOMEN 2 VIEW & CHEST 1 VIEW)  Comparison: CT 09/27/2010  Findings: Heart size is normal.  The lungs are free of focal consolidations and pleural effusions.  No evidence for free intraperitoneal air.  Surgical clips are identified in the right upper quadrant of the abdomen.  There is mild dilatation of small bowel loops.  Colonic gas is present.  Findings are consistent with focal ileus or early/partial small bowel obstruction.  Visualized osseous structures have a normal appearance.  IMPRESSION:  1. No evidence for acute cardiopulmonary abnormality. 2.  Findings consistent with focal ileus versus early/partial small bowel obstruction.  Original Report Authenticated By: EGlenice Bow M.D.  1115:  T/C to General Surgery Dr. Benn Moulder, case discussed, including:  HPI, pertinent PM/SHx, VS/PE, dx testing, ED course and treatment.  Agreeable to consult, requests to please admit to medicine service.   1230:  No vomiting/diarrhea while in ED.  Afebrile.  Dx testing d/w pt.  Questions answered.  Verb understanding, agreeable to admit.  T/C to [facility] Dr. Conley Canal, case discussed, including:  HPI, pertinent PM/SHx,  VS/PE, dx testing, ED course and treatment.  Agreeable to admit.  Requests to write temporary orders, medical bed.      I personally performed the services described in this documentation, which was scribed in my presence. The recorded information has been reviewed and considered.  [place], DO 06/01/11 2006

## 2011-05-31 NOTE — H&P (Signed)
Hospital Admission Note Date: 05/31/2011  Patient name: [name] Medical record number: [id number] Date of birth: Aug 11, 1945 Age: 65 y.o. Gender: female PCP: Kirk Ruths, MD  Attending physician: Christiane Ha  Chief Complaint:  Abdominal pain  History of Present Illness:  [name] is an 65 y.o. female who presents with a several day history of abdominal pain near her umbilicus. [name] was constipated yesterday but had a small liquid stool today. [name] had a small amount of vomiting. [name] still having pain, but not currently nauseated.  [name] has a history of Crohn's disease with right hemicolectomy. [name]'s had no hematochezia or hematemesis. [name] denies fevers or chills. [name] had a small amount of [place] potatoes and coffee last night. [name] has a ventral hernia. [name] was admitted earlier this year for small bowel obstruction which resolved with conservative management. The ED his physician has spoken with Dr. Dian Situ who recommends admission to medicine.  Past Medical History  Diagnosis Date  . Crohn's disease   . Hypertension   . Stroke   . Arthritis   . Sciatica of right side   . Degenerative disc disease, lumbar   . Ventral hernia     Meds: Prescriptions prior to admission  Medication Sig Dispense Refill  . aspirin 81 MG tablet Take 81 mg by mouth daily.       Marland Kitchen lisinopril (PRINIVIL,ZESTRIL) 20 MG tablet Take 10 mg by mouth daily.       . mesalamine (ASACOL) 400 MG EC tablet Take 800 mg by mouth 2 (two) times daily.       . metoprolol (LOPRESSOR) 100 MG tablet Take 50 mg by mouth daily.       . naproxen (NAPROSYN) 500 MG tablet Take 500 mg by mouth 2 (two) times daily as needed. For arthritis pain        Allergies: Flagyl History   Social History  . Marital Status: Widowed    Spouse Name: N/A    Number of Children: N/A  . Years of Education: N/A   Occupational History  . Not on file.   Social History Main Topics  . Smoking status: Current Everyday Smoker --  1.5 packs/day for 30 years  . Smokeless tobacco: Not on file  . Alcohol Use: No     one every 2 -3 months.  . Drug Use: Not on file  . Sexually Active: Not on file   Other Topics Concern  . Not on file   Social History Narrative  . No narrative on file   Family History  Problem Relation Age of Onset  . GI problems Father   . Stroke Brother   . Heart attack Mother   . Hypotension Sister    Past Surgical History  Procedure Date  . Cholecystectomy     2008  . Carotid endarterectomy     2004  . Hemicolectomy     right  . Tubal ligation     Review of Systems: Systems reviewed and as per HPI, otherwise negative.  Physical Exam: Blood pressure 113/74, pulse 80, temperature 98.7 F (37.1 C), temperature source Oral, resp. rate 16, height 5\' 2"  (1.575 m), weight 60.3 kg (132 lb 15 oz), SpO2 95.00%. BP 113/74  Pulse 80  Temp(Src) 98.7 F (37.1 C) (Oral)  Resp 16  Ht 5\' 2"  (1.575 m)  Wt 60.3 kg (132 lb 15 oz)  BMI 24.31 kg/m2  SpO2 95%  General Appearance:    Alert, cooperative, no distress, appears  older than stated age   Head:    Normocephalic, without obvious abnormality, atraumatic  Eyes:    PERRL, conjunctiva/corneas clear, EOM's intact, fundi    benign, both eyes  Ears:    Normal TM's and external ear canals, both ears  Nose:   Nares normal, septum midline, mucosa normal, no drainage    or sinus tenderness  Throat:  dry mucous membranes   Neck:   Supple, symmetrical, trachea midline, no adenopathy;    thyroid:  no enlargement/tenderness/nodules; no carotid   bruit or JVD  Back:     Symmetric, no curvature, ROM normal, no CVA tenderness  Lungs:     Clear to auscultation bilaterally, respirations unlabored      Heart:    Regular rate and rhythm, S1 and S2 normal, no murmur, rub   or gallop     Abdomen:     bowel sounds present. Abdomen is soft. [name] does have diffuse tenderness but particularly right upper quadrant.   Genitalia:   deferred   Rectal:   defered    Extremities:   Extremities normal, atraumatic, no cyanosis or edema  Pulses:   2+ and symmetric all extremities  Skin:   Skin color, texture, turgor normal, no rashes or lesions  Lymph nodes:   Cervical, supraclavicular, and axillary nodes normal  Neurologic:   CNII-XII intact, normal strength, sensation and reflexes    throughout    Lab results: Basic Metabolic Panel:  Basename 05/31/11 0849  NA 134*  K 4.3  CL 99  CO2 26  GLUCOSE 123*  BUN 10  CREATININE 0.82  CALCIUM 9.3  MG --  PHOS --   Liver Function Tests:  [facility] 05/31/11 0849  AST 12  ALT 8  ALKPHOS 83  BILITOT 0.4  PROT 6.8  ALBUMIN 3.3*    Basename 05/31/11 0849  LIPASE 21  AMYLASE --   No results found for this basename: AMMONIA:2 in the last 72 hours CBC:  Basename 05/31/11 0849  WBC 11.2*  NEUTROABS 9.2*  HGB 13.6  HCT 39.9  MCV 97.8  PLT 200    Imaging results:  Ct Abdomen Pelvis W Contrast  05/31/2011  *RADIOLOGY REPORT*  Clinical Data: Abdominal pain.  History of Crohn's.  Back pain. Prior cholecystectomy and multiple Crohn's surgeries.  History of skin cancer.  CT ABDOMEN AND PELVIS WITH CONTRAST  Technique:  Multidetector CT imaging of the abdomen and pelvis was performed following the standard protocol during bolus administration of intravenous contrast.  Contrast: OMNIPAQUE IOHEXOL 300 MG/ML IV SOLN  Comparison: CT 09/27/2010  Findings: Lung bases are unremarkable.  Patient has had prior cholecystectomy.  There is dilatation of the common bile duct, within normal limits given the patient's prior surgery.  No focal abnormality identified within the liver, spleen, pancreas, adrenal glands, kidneys.  There is a large ventral hernia, to the left of midline.  Hernia contains dilated loops of small bowel in nondilated loops of large bowel.  However, the point of obstruction is best localized to the right lower quadrant, where there is enhancement of small bowel loops and change in caliber.   No mass identified in this region. Surgical clips are present in the right lower quadrant consistent with previous Crohn disease surgery.  Multiple colonic diverticula are present.  CT evidence for acute diverticulitis.  No abscess.  No retroperitoneal or mesenteric adenopathy.  There is dense calcification of the abdominal aorta without aneurysm.  No evidence for adnexal mass. Degenerative changes are  seen in the spine.  IMPRESSION:  1.  Large ventral hernia, containing dilated small bowel loops. 2.  Early or partial small bowel obstruction, best localized to adhesions in the right lower quadrant. 3.  Postoperative change in the right lower quadrant.  Original Report Authenticated By: Patterson Hammersmith, M.D.   Dg Abd Acute W/chest  05/31/2011  *RADIOLOGY REPORT*  Clinical Data: Rule out small bowel obstruction.  Vomiting. History of Crohn disease.  ACUTE ABDOMEN SERIES (ABDOMEN 2 VIEW & CHEST 1 VIEW)  Comparison: CT 09/27/2010  Findings: Heart size is normal.  The lungs are free of focal consolidations and pleural effusions.  No evidence for free intraperitoneal air.  Surgical clips are identified in the right upper quadrant of the abdomen.  There is mild dilatation of small bowel loops.  Colonic gas is present.  Findings are consistent with focal ileus or early/partial small bowel obstruction.  Visualized osseous structures have a normal appearance.  IMPRESSION:  1. No evidence for acute cardiopulmonary abnormality. 2.  Findings consistent with focal ileus versus early/partial small bowel obstruction.  Original Report Authenticated By: Patterson Hammersmith, M.D.    Assessment & Plan: Principal Problem:  *SBO (small bowel obstruction) Active Problems:  Crohn's disease  Benign essential HTN  Tobacco abuse  Patient will be admitted. Bowel rest. IV fluids. Anti-emetics and pain medication. Surgery has been consulted by ED physician. Appears to be more of a partial small bowel obstruction rather than  Crohn's flare. Will give IV metoprolol and hold her oral antihypertensives. Elzora Cullins L 05/31/2011, 4:17 PM

## 2011-06-01 LAB — BASIC METABOLIC PANEL
BUN: 12 mg/dL (ref 6–23)
Calcium: 8.8 mg/dL (ref 8.4–10.5)
Creatinine, Ser: 0.83 mg/dL (ref 0.50–1.10)
GFR calc Af Amer: 83 mL/min — ABNORMAL LOW (ref >90)
GFR calc non Af Amer: 72 mL/min — ABNORMAL LOW (ref >90)

## 2011-06-01 LAB — URINE CULTURE: Culture: NO GROWTH

## 2011-06-01 LAB — CBC
MCHC: 34.7 g/dL (ref 30.0–36.0)
RDW: 12.6 % (ref 11.5–15.5)

## 2011-06-01 MED ORDER — METOPROLOL TARTRATE 25 MG PO TABS
12.5000 mg | ORAL_TABLET | Freq: Two times a day (BID) | ORAL | Status: DC
  Administered 2011-06-01 – 2011-06-03 (×5): 12.5 mg via ORAL
  Filled 2011-06-01 (×5): qty 1

## 2011-06-01 MED ORDER — MESALAMINE 400 MG PO TBEC
800.0000 mg | DELAYED_RELEASE_TABLET | Freq: Two times a day (BID) | ORAL | Status: DC
  Administered 2011-06-01 – 2011-06-03 (×5): 800 mg via ORAL
  Filled 2011-06-01 (×8): qty 2

## 2011-06-01 NOTE — Progress Notes (Signed)
Home medications returned back to patient from Pharmacy. Home medications sent home with patients daughter.

## 2011-06-01 NOTE — Progress Notes (Addendum)
Subjective: No bowel movement today. Pain improved. No nausea or vomiting. Hungry. No abdominal swelling.  Objective: Vital signs in last 24 hours: Filed Vitals:   05/31/11 1200 05/31/11 1543 05/31/11 2251 06/01/11 0552  BP: 111/73 113/74 102/67 107/72  Pulse: 88 80 85 76  Temp:  98.7 F (37.1 C) 98.1 F (36.7 C) 98 F (36.7 C)  TempSrc:  Oral Oral Oral  Resp:  16 18 19   Height:  5\' 2"  (1.575 m)    Weight:  60.3 kg (132 lb 15 oz)    SpO2: 94% 95% 93% 95%   Weight change:  No intake or output data in the 24 hours ending 06/01/11 1152 Physical Exam: : Comfortable Lungs clear to auscultation bilaterally without wheeze rhonchi or rales Cardiovascular regular rate rhythm without murmurs gallops rubs Abdomen soft nondistended. Minimal tenderness. Extremities no clubbing cyanosis or edema  Lab Results: Basic Metabolic Panel:  Lab 06/01/11 1191 05/31/11 0849  NA 136 134*  K 3.6 4.3  CL 101 99  CO2 26 26  GLUCOSE 77 123*  BUN 12 10  CREATININE 0.83 0.82  CALCIUM 8.8 9.3  MG -- --  PHOS -- --   Liver Function Tests:  Lab 05/31/11 0849  AST 12  ALT 8  ALKPHOS 83  BILITOT 0.4  PROT 6.8  ALBUMIN 3.3*    Lab 05/31/11 0849  LIPASE 21  AMYLASE --   No results found for this basename: AMMONIA:2 in the last 168 hours CBC:  Lab 06/01/11 0615 05/31/11 0849  WBC 7.4 11.2*  NEUTROABS -- 9.2*  HGB 12.3 13.6  HCT 35.4* 39.9  MCV 98.9 97.8  PLT 208 200   Studies/Results: Ct Abdomen Pelvis W Contrast  05/31/2011  *RADIOLOGY REPORT*  Clinical Data: Abdominal pain.  History of Crohn's.  Back pain. Prior cholecystectomy and multiple Crohn's surgeries.  History of skin cancer.  CT ABDOMEN AND PELVIS WITH CONTRAST  Technique:  Multidetector CT imaging of the abdomen and pelvis was performed following the standard protocol during bolus administration of intravenous contrast.  Contrast: OMNIPAQUE IOHEXOL 300 MG/ML IV SOLN  Comparison: CT 09/27/2010  Findings: Lung bases  are unremarkable.  Patient has had prior cholecystectomy.  There is dilatation of the common bile duct, within normal limits given the patient's prior surgery.  No focal abnormality identified within the liver, spleen, pancreas, adrenal glands, kidneys.  There is a large ventral hernia, to the left of midline.  Hernia contains dilated loops of small bowel in nondilated loops of large bowel.  However, the point of obstruction is best localized to the right lower quadrant, where there is enhancement of small bowel loops and change in caliber.  No mass identified in this region. Surgical clips are present in the right lower quadrant consistent with previous Crohn disease surgery.  Multiple colonic diverticula are present.  CT evidence for acute diverticulitis.  No abscess.  No retroperitoneal or mesenteric adenopathy.  There is dense calcification of the abdominal aorta without aneurysm.  No evidence for adnexal mass. Degenerative changes are seen in the spine.  IMPRESSION:  1.  Large ventral hernia, containing dilated small bowel loops. 2.  Early or partial small bowel obstruction, best localized to adhesions in the right lower quadrant. 3.  Postoperative change in the right lower quadrant.  Original Report Authenticated By: Patterson Hammersmith, M.D.   Dg Abd Acute W/chest  05/31/2011  *RADIOLOGY REPORT*  Clinical Data: Rule out small bowel obstruction.  Vomiting. History of Crohn disease.  ACUTE ABDOMEN SERIES (ABDOMEN 2 VIEW & CHEST 1 VIEW)  Comparison: CT 09/27/2010  Findings: Heart size is normal.  The lungs are free of focal consolidations and pleural effusions.  No evidence for free intraperitoneal air.  Surgical clips are identified in the right upper quadrant of the abdomen.  There is mild dilatation of small bowel loops.  Colonic gas is present.  Findings are consistent with focal ileus or early/partial small bowel obstruction.  Visualized osseous structures have a normal appearance.  IMPRESSION:  1. No  evidence for acute cardiopulmonary abnormality. 2.  Findings consistent with focal ileus versus early/partial small bowel obstruction.  Original Report Authenticated By: Patterson Hammersmith, M.D.   Scheduled Meds:   . mesalamine  800 mg Oral BID  . metoprolol tartrate  12.5 mg Oral BID  . nicotine  21 mg Transdermal Daily  . DISCONTD: sodium chloride   Intravenous STAT  . DISCONTD: metoprolol  5 mg Intravenous Q6H   Continuous Infusions:   . sodium chloride 75 mL/hr at 06/01/11 0256   PRN Meds:.acetaminophen, acetaminophen, HYDROmorphone, morphine injection, ondansetron (ZOFRAN) IV, ondansetron, DISCONTD:  HYDROmorphone (DILAUDID) injection, DISCONTD: ondansetron, DISCONTD: ondansetron (ZOFRAN) IV Assessment/Plan: Principal Problem:  *SBO (small bowel obstruction) Active Problems:  Crohn's disease  Benign essential HTN  Tobacco abuse  Symptoms improved. Start a clear liquid diet and decrease IV fluids. Resume mesalamine. Resume metoprolol at lower dose.   LOS: 1 day   Madeline Bebout L 06/01/2011, 11:52 AM

## 2011-06-01 NOTE — Consult Note (Signed)
Reason for Consult: Abdominal pain, ventral hernia Referring Physician: [facility] hospitalist  RASHEL OKEEFE is an 65 y.o. female.  HPI: Patient is well-known to me from previous admissions. She presented to [facility] emergency department with abdominal pain and some nausea. The pain started first. It is described as diffuse. Told to 18. She denies any exacerbating or relieving factors. Currently her symptomatology has improved but she still states it is present. She has tolerated liquids. She had a bowel movement yesterday. It is somewhat loose but she denied any blood or more next post. No fevers or chills.  Past Medical History  Diagnosis Date  . Crohn's disease   . Hypertension   . Stroke   . Arthritis   . Sciatica of right side   . Degenerative disc disease, lumbar   . Ventral hernia     Past Surgical History  Procedure Date  . Cholecystectomy     2008  . Carotid endarterectomy     2004  . Hemicolectomy     right  . Tubal ligation     Family History  Problem Relation Age of Onset  . GI problems Father   . Stroke Brother   . Heart attack Mother   . Hypotension Sister     Social History:  reports that she has been smoking.  She does not have any smokeless tobacco history on file. She reports that she does not drink alcohol. Her drug history not on file.  Allergies:  Allergies  Allergen Reactions  . Flagyl (Metronidazole Hcl) Other (See Comments)    "makes me feel whoosy and weird"    Medications:  I have reviewed the patient's current medications. Prior to Admission:  Prescriptions prior to admission  Medication Sig Dispense Refill  . aspirin 81 MG tablet Take 81 mg by mouth daily.       Marland Kitchen lisinopril (PRINIVIL,ZESTRIL) 20 MG tablet Take 10 mg by mouth daily.       . mesalamine (ASACOL) 400 MG EC tablet Take 800 mg by mouth 2 (two) times daily.       . metoprolol (LOPRESSOR) 100 MG tablet Take 50 mg by mouth daily.       . naproxen (NAPROSYN) 500 MG tablet  Take 500 mg by mouth 2 (two) times daily as needed. For arthritis pain       Scheduled:   . mesalamine  800 mg Oral BID  . metoprolol tartrate  12.5 mg Oral BID  . nicotine  21 mg Transdermal Daily  . DISCONTD: sodium chloride   Intravenous STAT  . DISCONTD: metoprolol  5 mg Intravenous Q6H   Continuous:   . sodium chloride 50 mL/hr at 06/01/11 1201   MVH:[id number], acetaminophen, HYDROmorphone, morphine injection, ondansetron (ZOFRAN) IV, ondansetron, DISCONTD:  HYDROmorphone (DILAUDID) injection, DISCONTD: ondansetron, DISCONTD: ondansetron (ZOFRAN) IV  Results for orders placed during the hospital encounter of 05/31/11 (from the past 48 hour(s))  CBC     Status: Abnormal   Collection Time   05/31/11  8:49 AM      Component Value Range Comment   WBC 11.2 (*) 4.0 - 10.5 (K/uL)    RBC 4.08  3.87 - 5.11 (MIL/uL)    Hemoglobin 13.6  12.0 - 15.0 (g/dL)    HCT 24.4  01.0 - 27.2 (%)    MCV 97.8  78.0 - 100.0 (fL)    MCH 33.3  26.0 - 34.0 (pg)    MCHC 34.1  30.0 - 36.0 (g/dL)  RDW 12.5  11.5 - 15.5 (%)    Platelets 200  150 - 400 (K/uL)   DIFFERENTIAL     Status: Abnormal   Collection Time   05/31/11  8:49 AM      Component Value Range Comment   Neutrophils Relative 83 (*) 43 - 77 (%)    Neutro Abs 9.2 (*) 1.7 - 7.7 (K/uL)    Lymphocytes Relative 10 (*) 12 - 46 (%)    Lymphs Abs 1.1  0.7 - 4.0 (K/uL)    Monocytes Relative 7  3 - 12 (%)    Monocytes Absolute 0.8  0.1 - 1.0 (K/uL)    Eosinophils Relative 0  0 - 5 (%)    Eosinophils Absolute 0.0  0.0 - 0.7 (K/uL)    Basophils Relative 0  0 - 1 (%)    Basophils Absolute 0.0  0.0 - 0.1 (K/uL)   LIPASE, BLOOD     Status: Normal   Collection Time   05/31/11  8:49 AM      Component Value Range Comment   Lipase 21  11 - 59 (U/L)   COMPREHENSIVE METABOLIC PANEL     Status: Abnormal   Collection Time   05/31/11  8:49 AM      Component Value Range Comment   Sodium 134 (*) 135 - 145 (mEq/L)    Potassium 4.3  3.5 - 5.1 (mEq/L)     Chloride 99  96 - 112 (mEq/L)    CO2 26  19 - 32 (mEq/L)    Glucose, Bld 123 (*) 70 - 99 (mg/dL)    BUN 10  6 - 23 (mg/dL)    Creatinine, Ser 1.19  0.50 - 1.10 (mg/dL)    Calcium 9.3  8.4 - 10.5 (mg/dL)    Total Protein 6.8  6.0 - 8.3 (g/dL)    Albumin 3.3 (*) 3.5 - 5.2 (g/dL)    AST 12  0 - 37 (U/L)    ALT 8  0 - 35 (U/L)    Alkaline Phosphatase 83  39 - 117 (U/L)    Total Bilirubin 0.4  0.3 - 1.2 (mg/dL)    GFR calc non Af Amer 73 (*) >90 (mL/min)    GFR calc Af Amer 85 (*) >90 (mL/min)   LACTIC ACID, PLASMA     Status: Normal   Collection Time   05/31/11  8:49 AM      Component Value Range Comment   Lactic Acid, Venous 1.2  0.5 - 2.2 (mmol/L)   URINALYSIS, ROUTINE W REFLEX MICROSCOPIC     Status: Abnormal   Collection Time   05/31/11 10:20 AM      Component Value Range Comment   Color, Urine YELLOW  YELLOW     APPearance CLEAR  CLEAR     Specific Gravity, Urine 1.010  1.005 - 1.030     pH 5.5  5.0 - 8.0     Glucose, UA NEGATIVE  NEGATIVE (mg/dL)    Hgb urine dipstick TRACE (*) NEGATIVE     Bilirubin Urine NEGATIVE  NEGATIVE     Ketones, ur NEGATIVE  NEGATIVE (mg/dL)    Protein, ur NEGATIVE  NEGATIVE (mg/dL)    Urobilinogen, UA 0.2  0.0 - 1.0 (mg/dL)    Nitrite NEGATIVE  NEGATIVE     Leukocytes, UA NEGATIVE  NEGATIVE    URINE MICROSCOPIC-ADD ON     Status: Abnormal   Collection Time   05/31/11 10:20 AM      Component  Value Range Comment   Squamous Epithelial / LPF FEW (*) RARE     WBC, UA 0-2  <3 (WBC/hpf)    RBC / HPF 0-2  <3 (RBC/hpf)   BASIC METABOLIC PANEL     Status: Abnormal   Collection Time   06/01/11  6:15 AM      Component Value Range Comment   Sodium 136  135 - 145 (mEq/L)    Potassium 3.6  3.5 - 5.1 (mEq/L)    Chloride 101  96 - 112 (mEq/L)    CO2 26  19 - 32 (mEq/L)    Glucose, Bld 77  70 - 99 (mg/dL)    BUN 12  6 - 23 (mg/dL)    Creatinine, Ser 4.09  0.50 - 1.10 (mg/dL)    Calcium 8.8  8.4 - 10.5 (mg/dL)    GFR calc non Af Amer 72 (*) >90  (mL/min)    GFR calc Af Amer 83 (*) >90 (mL/min)   CBC     Status: Abnormal   Collection Time   06/01/11  6:15 AM      Component Value Range Comment   WBC 7.4  4.0 - 10.5 (K/uL)    RBC 3.58 (*) 3.87 - 5.11 (MIL/uL)    Hemoglobin 12.3  12.0 - 15.0 (g/dL)    HCT 81.1 (*) 91.4 - 46.0 (%)    MCV 98.9  78.0 - 100.0 (fL)    MCH 34.4 (*) 26.0 - 34.0 (pg)    MCHC 34.7  30.0 - 36.0 (g/dL)    RDW 78.2  95.6 - 21.3 (%)    Platelets 208  150 - 400 (K/uL)     Ct Abdomen Pelvis W Contrast  05/31/2011  *RADIOLOGY REPORT*  Clinical Data: Abdominal pain.  History of Crohn's.  Back pain. Prior cholecystectomy and multiple Crohn's surgeries.  History of skin cancer.  CT ABDOMEN AND PELVIS WITH CONTRAST  Technique:  Multidetector CT imaging of the abdomen and pelvis was performed following the standard protocol during bolus administration of intravenous contrast.  Contrast: OMNIPAQUE IOHEXOL 300 MG/ML IV SOLN  Comparison: CT 09/27/2010  Findings: Lung bases are unremarkable.  Patient has had prior cholecystectomy.  There is dilatation of the common bile duct, within normal limits given the patient's prior surgery.  No focal abnormality identified within the liver, spleen, pancreas, adrenal glands, kidneys.  There is a large ventral hernia, to the left of midline.  Hernia contains dilated loops of small bowel in nondilated loops of large bowel.  However, the point of obstruction is best localized to the right lower quadrant, where there is enhancement of small bowel loops and change in caliber.  No mass identified in this region. Surgical clips are present in the right lower quadrant consistent with previous Crohn disease surgery.  Multiple colonic diverticula are present.  CT evidence for acute diverticulitis.  No abscess.  No retroperitoneal or mesenteric adenopathy.  There is dense calcification of the abdominal aorta without aneurysm.  No evidence for adnexal mass. Degenerative changes are seen in the spine.   IMPRESSION:  1.  Large ventral hernia, containing dilated small bowel loops. 2.  Early or partial small bowel obstruction, best localized to adhesions in the right lower quadrant. 3.  Postoperative change in the right lower quadrant.  Original Report Authenticated By: Patterson Hammersmith, M.D.   Dg Abd Acute W/chest  05/31/2011  *RADIOLOGY REPORT*  Clinical Data: Rule out small bowel obstruction.  Vomiting. History of Crohn disease.  ACUTE ABDOMEN SERIES (ABDOMEN 2 VIEW & CHEST 1 VIEW)  Comparison: CT 09/27/2010  Findings: Heart size is normal.  The lungs are free of focal consolidations and pleural effusions.  No evidence for free intraperitoneal air.  Surgical clips are identified in the right upper quadrant of the abdomen.  There is mild dilatation of small bowel loops.  Colonic gas is present.  Findings are consistent with focal ileus or early/partial small bowel obstruction.  Visualized osseous structures have a normal appearance.  IMPRESSION:  1. No evidence for acute cardiopulmonary abnormality. 2.  Findings consistent with focal ileus versus early/partial small bowel obstruction.  Original Report Authenticated By: Patterson Hammersmith, M.D.    Review of Systems  Constitutional: Negative for fever and chills.  HENT: Negative.   Eyes: Negative.   Respiratory: Negative.   Cardiovascular: Negative.   Gastrointestinal: Positive for heartburn, nausea and diarrhea. Negative for vomiting, abdominal pain, constipation, blood in stool and melena.  Genitourinary: Negative.   Musculoskeletal: Negative.   Skin: Negative.   Neurological: Positive for weakness.  Endo/Heme/Allergies: Negative.   Psychiatric/Behavioral: Negative.    Blood pressure 107/72, pulse 76, temperature 98 F (36.7 C), temperature source Oral, resp. rate 19, height 5\' 2"  (1.575 m), weight 60.3 kg (132 lb 15 oz), SpO2 95.00%. Physical Exam  Constitutional: She is oriented to person, place, and time. She appears well-developed and  well-nourished.       Obese. Slightly disheveled  HENT:  Head: Normocephalic and atraumatic.  Eyes: Conjunctivae and EOM are normal. Pupils are equal, round, and reactive to light. No scleral icterus.  Neck: Normal range of motion. Neck supple. No tracheal deviation present. No thyromegaly present.  Cardiovascular: Normal rate, regular rhythm and normal heart sounds.   Respiratory: Effort normal and breath sounds normal. No respiratory distress.  GI: Soft. Bowel sounds are normal. She exhibits distension (large ventral abdominal hernia.). She exhibits no mass. There is tenderness. There is no rebound (mild tenderness to deep palpation. No peritoneal sign) and no guarding.  Lymphadenopathy:    She has no cervical adenopathy.  Neurological: She is alert and oriented to person, place, and time.  Skin: Skin is warm and dry.    Assessment/Plan: Abdominal pain. At this point I have a low suspicion that her symptomatology is stemming from her ventral hernia. This is a very large wide-based ventral hernia. While he does have likely chronically incarcerated loops of small intestine there is a wide base which would make incarcerated bowel. Additionally her clinical picture does not indicate a complete bowel obstruction nor at this time would I be suspicious of a progressing partial small bowel obstruction. She has tolerated the initiation of an oral diet. I discussed with the patient not to force any intake from above but I would agree at this point to continue trial of advancement of her diet. Due to the large size of her hernia and the chronicity of this any repair would be complicated. As I had discussed with her on her previous visit this would be something to consider in an ideal elective situation. I also discussed possible referral to a tertiary care center again as an outpatient in the future for possible plastics closure of her nominal wall hernia. I will continue to follow her course while she is  here. At this time however there is no acute surgical indications and I would continue to manage her conservatively.  Kailoni Vahle C 06/01/2011, 1:43 PM

## 2011-06-02 ENCOUNTER — Inpatient Hospital Stay (HOSPITAL_COMMUNITY): Payer: Medicare Other

## 2011-06-02 NOTE — Progress Notes (Signed)
UR Chart Review Completed  

## 2011-06-02 NOTE — Progress Notes (Signed)
Discussed with Dr. Leticia Penna.  Subjective: Still having some Abdominal pain. Tolerating full liquid diet. Having loose bowel movements. No nausea or vomiting.  Objective: Vital signs in last 24 hours: Filed Vitals:   05/31/11 2251 06/01/11 0552 06/01/11 2025 06/02/11 0450  BP: 102/67 107/72 109/73 108/70  Pulse: 85 76 75 71  Temp: 98.1 F (36.7 C) 98 F (36.7 C) 98.4 F (36.9 C) 98.1 F (36.7 C)  TempSrc: Oral Oral Oral Oral  Resp: 18 19 20 18   Height:      Weight:      SpO2: 93% 95% 96% 96%   Weight change:   Intake/Output Summary (Last 24 hours) at 06/02/11 1547 Last data filed at 06/02/11 0800  Gross per 24 hour  Intake    360 ml  Output      2 ml  Net    358 ml   Physical Exam:  Gen: Comfortable Lungs clear to auscultation bilaterally without wheeze rhonchi or rales Cardiovascular regular rate rhythm without murmurs gallops rubs Abdomen more distended this afternoon than yesterday. Extremities no clubbing cyanosis or edema  Lab Results: Basic Metabolic Panel:  Lab 06/01/11 4098 05/31/11 0849  NA 136 134*  K 3.6 4.3  CL 101 99  CO2 26 26  GLUCOSE 77 123*  BUN 12 10  CREATININE 0.83 0.82  CALCIUM 8.8 9.3  MG -- --  PHOS -- --   Liver Function Tests:  Lab 05/31/11 0849  AST 12  ALT 8  ALKPHOS 83  BILITOT 0.4  PROT 6.8  ALBUMIN 3.3*    Lab 05/31/11 0849  LIPASE 21  AMYLASE --   No results found for this basename: AMMONIA:2 in the last 168 hours CBC:  Lab 06/01/11 0615 05/31/11 0849  WBC 7.4 11.2*  NEUTROABS -- 9.2*  HGB 12.3 13.6  HCT 35.4* 39.9  MCV 98.9 97.8  PLT 208 200   Studies/Results: No results found. Scheduled Meds:    . mesalamine  800 mg Oral BID  . metoprolol tartrate  12.5 mg Oral BID  . nicotine  21 mg Transdermal Daily   Continuous Infusions:    . sodium chloride 1,000 mL (06/01/11 2011)   PRN Meds:.acetaminophen, acetaminophen, HYDROmorphone, ondansetron (ZOFRAN) IV, ondansetron Assessment/Plan: Principal  Problem:  *SBO (small bowel obstruction) Active Problems:  Crohn's disease  Benign essential HTN  Tobacco abuse  Tolerating full liquids, but more distended this afternoon. Will check a flat and upright abdominal film tomorrow.   LOS: 2 days   Masiyah Jorstad L 06/02/2011, 3:47 PM

## 2011-06-02 NOTE — Progress Notes (Signed)
  Subjective: Pain is improved. She has tolerated full liquids without any episodes of nausea or increased abdominal pain. She has had some bowel function. This does remain somewhat loose. No blood or melena.  Objective: Vital signs in last 24 hours: Temp:  [98.1 F (36.7 C)-98.4 F (36.9 C)] 98.1 F (36.7 C) (12/17 0450) Pulse Rate:  [71-75] 71  (12/17 0450) Resp:  [18-20] 18  (12/17 0450) BP: (108-109)/(70-73) 108/70 mmHg (12/17 0450) SpO2:  [96 %] 96 % (12/17 0450) Last BM Date: 06/02/11  Intake/Output from previous day: 12/16 0701 - 12/17 0700 In: 1965 [P.O.:240; I.V.:1725] Out: 1 [Stool:1] Intake/Output this shift: Total I/O In: 120 [P.O.:120] Out: 1 [Urine:1]  General appearance: alert and no distress GI: Soft, positive bowel sounds, mild discomfort to deep palpation. Large ventral abdominal wall hernia. No peritoneal signs.  Lab Results:   [facility] 06/01/11 0615 05/31/11 0849  WBC 7.4 11.2*  HGB 12.3 13.6  HCT 35.4* 39.9  PLT 208 200   BMET  Basename 06/01/11 0615 05/31/11 0849  NA 136 134*  K 3.6 4.3  CL 101 99  CO2 26 26  GLUCOSE 77 123*  BUN 12 10  CREATININE 0.83 0.82  CALCIUM 8.8 9.3   PT/INR No results found for this basename: LABPROT:2,INR:2 in the last 72 hours ABG No results found for this basename: PHART:2,PCO2:2,PO2:2,HCO3:2 in the last 72 hours  Studies/Results: No results found.  Anti-infectives: Anti-infectives    None      Assessment/Plan: s/p  Large ventral wall hernia. At this point patient remained stable. Continue to increase diet as tolerated. There are no acute surgical indications. We'll continue to follow her course during hospitalization but this time there are no plans to proceed with repair of her large chronically incarcerated ventral hernia.  LOS: 2 days    Maicy Filip C 06/02/2011

## 2011-06-03 ENCOUNTER — Encounter (HOSPITAL_COMMUNITY): Payer: Self-pay | Admitting: Internal Medicine

## 2011-06-03 DIAGNOSIS — K439 Ventral hernia without obstruction or gangrene: Secondary | ICD-10-CM | POA: Diagnosis present

## 2011-06-03 DIAGNOSIS — I10 Essential (primary) hypertension: Secondary | ICD-10-CM | POA: Diagnosis present

## 2011-06-03 MED ORDER — ONDANSETRON HCL 4 MG PO TABS: 4.0000 mg | ORAL_TABLET | Freq: Four times a day (QID) | ORAL | Status: AC | PRN

## 2011-06-03 NOTE — Discharge Summary (Signed)
Physician Discharge Summary  [name] MRN: [id number] DOB/AGE: [birth year] 65 y.o.  PCP: Kirk Ruths, MD   Admit date: 05/31/2011 Discharge date: 06/03/2011  Discharge Diagnoses:  1. Partial small bowel obstruction, resolved with medical management only. 2. Chronic large ventral abdominal hernia, likely chronically incarcerated per general surgeon Dr. Leticia Penna. Surgical repair not needed during this hospitalization. 3. Crohn's disease. Stable. 4. Hypertension. 5. Tobacco abuse.   Current Discharge Medication List    START taking these medications   Details  ondansetron (ZOFRAN) 4 MG tablet Take 1 tablet (4 mg total) by mouth every 6 (six) hours as needed for nausea. Qty: 20 tablet, Refills: 0      CONTINUE these medications which have NOT CHANGED   Details  aspirin 81 MG tablet Take 81 mg by mouth daily.     lisinopril (PRINIVIL,ZESTRIL) 20 MG tablet Take 10 mg by mouth daily.     mesalamine (ASACOL) 400 MG EC tablet Take 800 mg by mouth 2 (two) times daily.     metoprolol (LOPRESSOR) 100 MG tablet Take 50 mg by mouth daily.     naproxen (NAPROSYN) 500 MG tablet Take 500 mg by mouth 2 (two) times daily as needed. For arthritis pain        Discharge Condition: Improved and stable.  Disposition: Home or Self Care   Consults: Tilford Pillar, M.D.   Significant Diagnostic Studies: Ct Abdomen Pelvis W Contrast  05/31/2011  *RADIOLOGY REPORT*  Clinical Data: Abdominal pain.  History of Crohn's.  Back pain. Prior cholecystectomy and multiple Crohn's surgeries.  History of skin cancer.  CT ABDOMEN AND PELVIS WITH CONTRAST  Technique:  Multidetector CT imaging of the abdomen and pelvis was performed following the standard protocol during bolus administration of intravenous contrast.  Contrast: OMNIPAQUE IOHEXOL 300 MG/ML IV SOLN  Comparison: CT 09/27/2010  Findings: Lung bases are unremarkable.  Patient has had prior cholecystectomy.  There is dilatation  of the common bile duct, within normal limits given the patient's prior surgery.  No focal abnormality identified within the liver, spleen, pancreas, adrenal glands, kidneys.  There is a large ventral hernia, to the left of midline.  Hernia contains dilated loops of small bowel in nondilated loops of large bowel.  However, the point of obstruction is best localized to the right lower quadrant, where there is enhancement of small bowel loops and change in caliber.  No mass identified in this region. Surgical clips are present in the right lower quadrant consistent with previous Crohn disease surgery.  Multiple colonic diverticula are present.  CT evidence for acute diverticulitis.  No abscess.  No retroperitoneal or mesenteric adenopathy.  There is dense calcification of the abdominal aorta without aneurysm.  No evidence for adnexal mass. Degenerative changes are seen in the spine.  IMPRESSION:  1.  Large ventral hernia, containing dilated small bowel loops. 2.  Early or partial small bowel obstruction, best localized to adhesions in the right lower quadrant. 3.  Postoperative change in the right lower quadrant.  Original Report Authenticated By: Patterson Hammersmith, M.D.   Dg Abd 2 Views  06/02/2011  *RADIOLOGY REPORT*  Clinical Data: Small bowel obstruction, followup.  ABDOMEN - 2 VIEW  Comparison: 05/31/2011  Findings: Continued mild dilatation of small bowel loops.  Gas is seen within the colon.  No real change since prior study.  Prior cholecystectomy.  No organomegaly or suspicious calcification.  No acute bony abnormality.  IMPRESSION: Continued mild dilatation of small bowel loops,  likely small bowel obstruction.  No real change.  Gas is seen within the colon.  Prior cholecystectomy.  Original Report Authenticated By: Cyndie Chime, M.D.   Dg Abd Acute W/chest  05/31/2011  *RADIOLOGY REPORT*  Clinical Data: Rule out small bowel obstruction.  Vomiting. History of Crohn disease.  ACUTE ABDOMEN SERIES  (ABDOMEN 2 VIEW & CHEST 1 VIEW)  Comparison: CT 09/27/2010  Findings: Heart size is normal.  The lungs are free of focal consolidations and pleural effusions.  No evidence for free intraperitoneal air.  Surgical clips are identified in the right upper quadrant of the abdomen.  There is mild dilatation of small bowel loops.  Colonic gas is present.  Findings are consistent with focal ileus or early/partial small bowel obstruction.  Visualized osseous structures have a normal appearance.  IMPRESSION:  1. No evidence for acute cardiopulmonary abnormality. 2.  Findings consistent with focal ileus versus early/partial small bowel obstruction.  Original Report Authenticated By: Patterson Hammersmith, M.D.     Microbiology: Recent Results (from the past 240 hour(s))  URINE CULTURE     Status: Normal   Collection Time   05/31/11 10:22 AM      Component Value Range Status Comment   Specimen Description URINE, CLEAN CATCH   Final    Special Requests NONE   Final    Setup Time [id number]   Final    Colony Count NO GROWTH   Final    Culture NO GROWTH   Final    Report Status 06/01/2011 FINAL   Final      Labs: No results found for this or any previous visit (from the past 48 hour(s)).   HPI : The patient is a 65 year old woman with a past medical history significant for a large ventral abdominal hernia, previous small bowel obstruction, Crohn's disease, hypertension, and tobacco use. She presented to the emergency department on the May 31, 2011 with a chief complaint of abdominal pain near her umbilicus. In the emergency department, she was noted to be hemodynamically stable and afebrile. Her lab data were significant for a mildly low serum sodium of 134, mild hyperglycemia with a glucose of 123, normal lipase, normal liver transaminases, and a mild leukocytosis with a WBC of 11.2. The x-ray of her abdomen revealed findings consistent with a focal ileus versus early partial small bowel obstruction.  Subsequently, the CT scan of her abdomen and pelvis with contrast revealed a large ventral hernia containing dilated small bowel loops and early or partial small bowel obstruction. She was admitted for further evaluation and management.  HOSPITAL COURSE: The patient was started on bowel rest. IV fluids were initiated for hydration. Antiemetics and analgesics were ordered as needed intravenously. IV metoprolol was given in place of oral metoprolol. Her Crohn's disease medications were continued. It was the impression of the admitting physician that the patient did not have a Crohn's disease flareup, but rather, an uncomplicated partial small bowel obstruction. General surgeon, Dr. Leticia Penna, was consulted. Per his assessment, he had a low suspicion for symptomatology stemming from her ventral hernia. He stated that it was likely that she had a chronically incarcerated large wide-based ventral hernia and that her clinical picture did not indicate a complete small bowel obstruction nor did it appear that her partial small bowel obstruction was progressing at the time of his exam. He discussed with the patient a possible referral to a tertiary care center as an outpatient in the future for possible plastics closure of the  ventral hernia.  The patient became less symptomatic. She began to have loose bowel movements. There was no evidence of bright red blood per rectum or melanotic stools. Her diet was advanced to a clear liquid diet and then subsequently to a full liquid diet. She had one episode of vomiting following intake of chocolate pudding, but otherwise, she tolerated her full liquid diet well. The followup x-ray of her abdomen revealed continued mild dilatation of small bowel loops.  Prior to discharge, the patient was ambulating in the hallway. She ate lunch with no increase in abdominal pain and no nausea vomiting. Her antihypertensive medications were restarted as her blood pressure began to trend upward.  She was advised to stop smoking during the hospitalization. Appointments were made for her to followup with her primary care physician and primary surgeon, Dr. Leticia Penna in 1-1/2 weeks. She was discharged to home in improved and stable condition.    Discharge Exam: See exam from this morning. Upon reexamination, the patient is in no acute distress. She is mildly hypertensive otherwise hemodynamically stable. Blood pressure 160/90, pulse 70, temperature 97.7 F (36.5 C), temperature source Oral, resp. rate 18, height 5\' 2"  (1.575 m), weight 60.3 kg (132 lb 15 oz), SpO2 95.00%.     Discharge Orders    Future Orders Please Complete By Expires   Diet - low sodium heart healthy      Increase activity slowly      Discharge instructions      Comments:   EAT A SOFT SEMI LIQUID LOW FAT HEART HEALTHY DIET AS TOLERATED.  FOLLOW UP WITH YOUR DOCTORS AS SCHEDULED.   Call MD for:  severe uncontrolled pain         Follow-up Information    Follow up with [facility] on 06/18/2011. (AT 9:30 AM.)       Follow up with ZIEGLER,BRENT C on 06/19/2011. (AT 2:15 AM)    Contact information:   [address] [place] [address] [phone]          Discharge time: 40 minutes.     Signed: Tarisha Fader 06/03/2011, 2:56 PM

## 2011-06-03 NOTE — Progress Notes (Signed)
Subjective: The patient says that her abdomen does not feel as bad as it did yesterday. She had a loose bowel movement this morning without blood or tarriness. She is tired of eating soup and pudding. She acknowledges one episode of vomiting after eating chocolate pudding last night. She has had no nausea or vomiting this morning. She wants to know when she can go home.  Objective: Vital signs in last 24 hours: Filed Vitals:   06/02/11 0450 06/02/11 1400 06/02/11 2146 06/03/11 0601  BP: 108/70 116/77 129/75 111/72  Pulse: 71 72 81 70  Temp: 98.1 F (36.7 C) 98 F (36.7 C) 98.1 F (36.7 C) 97.9 F (36.6 C)  TempSrc: Oral Oral Oral Oral  Resp: 18 18 18 19   Height:      Weight:      SpO2: 96% 96% 92% 97%   Weight change:   Intake/Output Summary (Last 24 hours) at 06/03/11 1147 Last data filed at 06/03/11 0900  Gross per 24 hour  Intake   1202 ml  Output    800 ml  Net    402 ml   Physical Exam:  Gen: Comfortable Lungs clear to auscultation bilaterally without wheeze rhonchi or rales Cardiovascular regular rate rhythm without murmurs gallops rubs Abdomen: Positive bowel sounds, protuberant, large ventral hernia palpated, mildly distended, mildly to moderately tender in the epigastrium and hypogastrium. No rigidity. Extremities no clubbing cyanosis or edema  Lab Results: Basic Metabolic Panel:  Lab 06/01/11 7829 05/31/11 0849  NA 136 134*  K 3.6 4.3  CL 101 99  CO2 26 26  GLUCOSE 77 123*  BUN 12 10  CREATININE 0.83 0.82  CALCIUM 8.8 9.3  MG -- --  PHOS -- --   Liver Function Tests:  Lab 05/31/11 0849  AST 12  ALT 8  ALKPHOS 83  BILITOT 0.4  PROT 6.8  ALBUMIN 3.3*    Lab 05/31/11 0849  LIPASE 21  AMYLASE --   No results found for this basename: AMMONIA:2 in the last 168 hours CBC:  Lab 06/01/11 0615 05/31/11 0849  WBC 7.4 11.2*  NEUTROABS -- 9.2*  HGB 12.3 13.6  HCT 35.4* 39.9  MCV 98.9 97.8  PLT 208 200   Studies/Results: Dg Abd 2  Views  06/02/2011  *RADIOLOGY REPORT*  Clinical Data: Small bowel obstruction, followup.  ABDOMEN - 2 VIEW  Comparison: 05/31/2011  Findings: Continued mild dilatation of small bowel loops.  Gas is seen within the colon.  No real change since prior study.  Prior cholecystectomy.  No organomegaly or suspicious calcification.  No acute bony abnormality.  IMPRESSION: Continued mild dilatation of small bowel loops, likely small bowel obstruction.  No real change.  Gas is seen within the colon.  Prior cholecystectomy.  Original Report Authenticated By: Cyndie Chime, M.D.   Scheduled Meds:    . mesalamine  800 mg Oral BID  . metoprolol tartrate  12.5 mg Oral BID  . nicotine  21 mg Transdermal Daily   Continuous Infusions:    . sodium chloride 1,000 mL (06/02/11 1619)   PRN Meds:.acetaminophen, acetaminophen, HYDROmorphone, ondansetron (ZOFRAN) IV, ondansetron Assessment/Plan: Principal Problem:  *SBO (small bowel obstruction) Active Problems:  Crohn's disease  Tobacco abuse  Benign essential HTN  Ventral hernia  She is hemodynamically stable and afebrile. Will continue current management. The abdominal x-ray yesterday revealed persistent dilated small bowel loops. The patient is having bowel movements. She did have a bout of vomiting last night only once. I hesitate  to advance her diet without discussing it with Dr. Leticia Penna first. The patient was encouraged to ambulate with assistance. Will check laboratory studies in the morning if she is still here.   LOS: 3 days   Neha Waight 06/03/2011, 11:47 AM

## 2011-06-03 NOTE — Progress Notes (Signed)
Pt discharge instructions, medications and follow up appts discussed with pt. No issues or concerns stated. Pt d/ced home at this time.

## 2011-06-13 ENCOUNTER — Encounter (INDEPENDENT_AMBULATORY_CARE_PROVIDER_SITE_OTHER): Payer: Self-pay | Admitting: *Deleted

## 2011-06-17 ENCOUNTER — Emergency Department (HOSPITAL_COMMUNITY)
Admission: EM | Admit: 2011-06-17 | Discharge: 2011-06-17 | Disposition: A | Discharge status: Home / Self Care | Payer: Medicare Other | Attending: Emergency Medicine | Admitting: Emergency Medicine

## 2011-06-17 ENCOUNTER — Encounter (HOSPITAL_COMMUNITY): Payer: Self-pay | Admitting: General Practice

## 2011-06-17 DIAGNOSIS — Z7982 Long term (current) use of aspirin: Secondary | ICD-10-CM | POA: Insufficient documentation

## 2011-06-17 DIAGNOSIS — R3915 Urgency of urination: Secondary | ICD-10-CM | POA: Insufficient documentation

## 2011-06-17 DIAGNOSIS — Z760 Encounter for issue of repeat prescription: Secondary | ICD-10-CM | POA: Insufficient documentation

## 2011-06-17 DIAGNOSIS — K5090 Crohn's disease, unspecified, without complications: Secondary | ICD-10-CM | POA: Insufficient documentation

## 2011-06-17 DIAGNOSIS — R10819 Abdominal tenderness, unspecified site: Secondary | ICD-10-CM | POA: Insufficient documentation

## 2011-06-17 DIAGNOSIS — R300 Dysuria: Secondary | ICD-10-CM | POA: Diagnosis not present

## 2011-06-17 DIAGNOSIS — Z79899 Other long term (current) drug therapy: Secondary | ICD-10-CM | POA: Insufficient documentation

## 2011-06-17 DIAGNOSIS — M1990 Unspecified osteoarthritis, unspecified site: Secondary | ICD-10-CM | POA: Insufficient documentation

## 2011-06-17 DIAGNOSIS — N390 Urinary tract infection, site not specified: Secondary | ICD-10-CM | POA: Diagnosis not present

## 2011-06-17 DIAGNOSIS — R350 Frequency of micturition: Secondary | ICD-10-CM | POA: Insufficient documentation

## 2011-06-17 DIAGNOSIS — I10 Essential (primary) hypertension: Secondary | ICD-10-CM | POA: Insufficient documentation

## 2011-06-17 DIAGNOSIS — F17200 Nicotine dependence, unspecified, uncomplicated: Secondary | ICD-10-CM | POA: Diagnosis not present

## 2011-06-17 DIAGNOSIS — Z8673 Personal history of transient ischemic attack (TIA), and cerebral infarction without residual deficits: Secondary | ICD-10-CM | POA: Insufficient documentation

## 2011-06-17 DIAGNOSIS — M129 Arthropathy, unspecified: Secondary | ICD-10-CM | POA: Insufficient documentation

## 2011-06-17 LAB — URINALYSIS, ROUTINE W REFLEX MICROSCOPIC
Protein, ur: NEGATIVE mg/dL
Urobilinogen, UA: 0.2 mg/dL (ref 0.0–1.0)

## 2011-06-17 LAB — URINE MICROSCOPIC-ADD ON

## 2011-06-17 MED ORDER — CEPHALEXIN 500 MG PO CAPS: 500.0000 mg | ORAL_CAPSULE | Freq: Four times a day (QID) | ORAL | Status: DC

## 2011-06-17 MED ORDER — LISINOPRIL 20 MG PO TABS: ORAL_TABLET | ORAL | Status: DC

## 2011-06-17 MED ORDER — CEPHALEXIN 500 MG PO CAPS
500.0000 mg | ORAL_CAPSULE | Freq: Once | ORAL | Status: AC
  Administered 2011-06-17: 500 mg via ORAL
  Filled 2011-06-17: qty 1

## 2011-06-17 NOTE — ED Notes (Signed)
C/o dysuria for 3 days.

## 2011-06-17 NOTE — ED Provider Notes (Signed)
History     CSN: [id number]  Arrival date & time 06/17/11  0806   First MD Initiated Contact with Patient 06/17/11 0825      Chief Complaint  Patient presents with  . Dysuria    (Consider location/radiation/quality/duration/timing/severity/associated sxs/prior treatment) HPI Comments: Patient c/o urinary frequency, burning and urgency for 3-4 days.  She was recently discharged form the hospital but denies catherization during her hospital stay.  She also denies fever, flank pain , vomiting or back pain.  She also requests refill for her lisinopril.  States she called her medication to the pharmacy but was told the refills had ran out.  She denies headaches, dizziness or chest pain  Patient is a 66 y.o. female presenting with dysuria. The history is provided by the patient.  Dysuria  This is a new problem. The current episode started more than 2 days ago. The problem occurs every urination. The problem has not changed since onset.The quality of the pain is described as burning. The pain is mild. There has been no fever. She is not sexually active. There is no history of pyelonephritis. Associated symptoms include frequency and urgency. Pertinent negatives include no chills, no nausea, no vomiting, no discharge, no hematuria, no hesitancy and no flank pain. She has tried nothing for the symptoms. Her past medical history does not include kidney stones, single kidney or catheterization.    Past Medical History  Diagnosis Date  . Crohn's disease   . Hypertension   . Stroke   . Arthritis   . Sciatica of right side   . Degenerative disc disease, lumbar   . Ventral hernia     Past Surgical History  Procedure Date  . Cholecystectomy     2008  . Carotid endarterectomy     2004  . Hemicolectomy     right  . Tubal ligation     Family History  Problem Relation Age of Onset  . GI problems Father   . Stroke Brother   . Heart attack Mother   . Hypotension Sister     History    Substance Use Topics  . Smoking status: Current Everyday Smoker -- 1.5 packs/day for 30 years  . Smokeless tobacco: Not on file  . Alcohol Use: No     one every 2 -3 months.    OB History    Grav Para Term Preterm Abortions TAB SAB Ect Mult Living                  Review of Systems  Constitutional: Negative for fever, chills and appetite change.  Gastrointestinal: Negative for nausea, vomiting, abdominal pain and abdominal distention.  Genitourinary: Positive for dysuria, urgency and frequency. Negative for hesitancy, hematuria, flank pain, vaginal bleeding, difficulty urinating and vaginal pain.  Musculoskeletal: Negative for back pain.  Psychiatric/Behavioral: Negative for confusion.  All other systems reviewed and are negative.    Allergies  Flagyl  Home Medications   Current Outpatient Rx  Name Route Sig Dispense Refill  . ASPIRIN 81 MG PO TABS Oral Take 81 mg by mouth daily.     Marland Kitchen LISINOPRIL 20 MG PO TABS Oral Take 10 mg by mouth daily.     Marland Kitchen MESALAMINE 400 MG PO TBEC Oral Take 800 mg by mouth 2 (two) times daily.     Marland Kitchen METOPROLOL TARTRATE 100 MG PO TABS Oral Take 50 mg by mouth daily.     Marland Kitchen NAPROXEN 500 MG PO TABS Oral Take 500 mg by  mouth 2 (two) times daily as needed. For arthritis pain      BP 142/95  Pulse 118  Temp 98.1 F (36.7 C)  Resp 16  Ht 5\' 2"  (1.575 m)  Wt 134 lb (60.782 kg)  BMI 24.51 kg/m2  SpO2 100%  Physical Exam  Nursing note and vitals reviewed. Constitutional: She is oriented to person, place, and time. She appears well-developed and well-nourished. No distress.  HENT:  Head: Normocephalic and atraumatic.  Mouth/Throat: Oropharynx is clear and moist.  Cardiovascular: Normal rate, regular rhythm and normal heart sounds.   No murmur heard. Pulmonary/Chest: Breath sounds normal.  Abdominal: Soft. She exhibits no distension. There is tenderness in the suprapubic area. There is no rigidity, no guarding and no CVA tenderness.   Musculoskeletal: She exhibits no edema.  Neurological: She is alert and oriented to person, place, and time. She exhibits normal muscle tone. Coordination normal.  Skin: Skin is warm.    ED Course  Procedures (including critical care time)  Results for orders placed during the hospital encounter of 06/17/11  URINALYSIS, ROUTINE W REFLEX MICROSCOPIC      Component Value Range   Color, Urine YELLOW  YELLOW    APPearance CLEAR  CLEAR    Specific Gravity, Urine <1.005 (*) 1.005 - 1.030    pH 6.0  5.0 - 8.0    Glucose, UA NEGATIVE  NEGATIVE (mg/dL)   Hgb urine dipstick LARGE (*) NEGATIVE    Bilirubin Urine NEGATIVE  NEGATIVE    Ketones, ur NEGATIVE  NEGATIVE (mg/dL)   Protein, ur NEGATIVE  NEGATIVE (mg/dL)   Urobilinogen, UA 0.2  0.0 - 1.0 (mg/dL)   Nitrite NEGATIVE  NEGATIVE    Leukocytes, UA SMALL (*) NEGATIVE   URINE MICROSCOPIC-ADD ON      Component Value Range   Squamous Epithelial / LPF RARE  RARE    WBC, UA 11-20  <3 (WBC/hpf)   RBC / HPF 3-6  <3 (RBC/hpf)   Bacteria, UA FEW (*) RARE          MDM    Patient is alert, NAd.  Vitals stable,. Ambulatory, non-toxic appearing.   No fever, vomiting or CVA tenderness to suggest pyleo.  Urine culture is pending.  Pt agrees to f/u with her PMD for recheck.  Will start her on keflex        Jaslin Novitski L. Alverta Caccamo, PA 06/19/11 1618  Suleika Donavan L. [place], [place] 06/19/11 1619

## 2011-06-19 LAB — URINE CULTURE: Colony Count: 30000

## 2011-06-20 NOTE — ED Provider Notes (Signed)
Medical screening examination/treatment/procedure(s) were performed by non-physician practitioner and as supervising physician I was immediately available for consultation/collaboration.   Veryl Speak, MD 06/20/11 [phone]

## 2011-06-20 NOTE — ED Notes (Signed)
+   Urine Chart sent to EDP office for review. 

## 2011-06-24 NOTE — ED Notes (Signed)
Keflex is adequate treatment per Lorenz Coaster, PA

## 2011-06-26 ENCOUNTER — Ambulatory Visit (INDEPENDENT_AMBULATORY_CARE_PROVIDER_SITE_OTHER): Payer: Medicare Other | Admitting: Internal Medicine

## 2011-06-26 ENCOUNTER — Encounter (INDEPENDENT_AMBULATORY_CARE_PROVIDER_SITE_OTHER): Payer: Self-pay | Admitting: Internal Medicine

## 2011-06-26 VITALS — BP 130/70 | HR 72 | Temp 97.8°F | Ht 62.0 in | Wt 136.4 lb

## 2011-06-26 DIAGNOSIS — K5090 Crohn's disease, unspecified, without complications: Secondary | ICD-10-CM

## 2011-06-26 LAB — CBC
MCHC: 31.8 g/dL (ref 30.0–36.0)
MCV: 99 fL (ref 78.0–100.0)
Platelets: 267 10*3/uL (ref 150–400)
RDW: 12.4 % (ref 11.5–15.5)
WBC: 9.1 10*3/uL (ref 4.0–10.5)

## 2011-06-26 NOTE — Progress Notes (Signed)
Subjective:     Patient ID: [name], female   DOB: 06-15-1946, 66 y.o.   MRN: [id number]  HPI [name] is a 66 yr old female presenting today for f/u after a recent admission to [facility] in December.  She was admitted with a small bowel obstruction. She was evaluated by Dr. Leticia Penna and was treated medically.  She also has a chronic large ventral abdominal hernia, lokely chronically incarcerated per Dr. Leticia Penna. Surgical repair was not needed during hospitalization. Crohn's disease stable.  Today she say she is 100% better. She says she feels good. She usually has 1-3 stools a day. No melena or bright red rectal bleeding. Appetite is good. No weight loss.  2008 Dr. Anise Salvo: Resection of terminal ileum and cecum, ileocectomy for intrabdominal abscess from Crohn's. 2007: Diagnosed with Crohn's on CT exam.   Review of Systems Current Outpatient Prescriptions  Medication Sig Dispense Refill  . aspirin 81 MG tablet Take 81 mg by mouth daily.       Marland Kitchen lisinopril (PRINIVIL,ZESTRIL) 20 MG tablet Take 10 mg by mouth daily.       Marland Kitchen lisinopril (PRINIVIL,ZESTRIL) 20 MG tablet Take 1/2 tablet po qd  20 tablet  0  . mesalamine (ASACOL) 400 MG EC tablet Take 800 mg by mouth 2 (two) times daily.       . metoprolol (LOPRESSOR) 100 MG tablet Take 50 mg by mouth daily.       . naproxen (NAPROSYN) 500 MG tablet Take 500 mg by mouth 2 (two) times daily as needed. For arthritis pain       Past Medical History  Diagnosis Date  . Crohn's disease   . Hypertension   . Stroke   . Arthritis   . Sciatica of right side   . Degenerative disc disease, lumbar   . Ventral hernia    Past Surgical History  Procedure Date  . Cholecystectomy     2008  . Carotid endarterectomy     2004  . Hemicolectomy     right  . Tubal ligation    History   Social History  . Marital Status: Widowed    Spouse Name: N/A    Number of Children: N/A  . Years of Education: N/A   Occupational History  . Not on file.     Social History Main Topics  . Smoking status: Current Everyday Smoker -- 1.5 packs/day for 30 years  . Smokeless tobacco: Not on file   Comment: 1 pack day x 20 yrs.  . Alcohol Use: No     one every 2 -3 months.  . Drug Use: Not on file  . Sexually Active: Not on file   Other Topics Concern  . Not on file   Social History Narrative  . No narrative on file   Family Status  Relation Status Death Age  . Father Deceased     Stomach cancer  . Brother Alive     One CVA, One Cardiac stent, CAD. One back  surgery, Head cancer.  . Mother Deceased     MI  . Sister Alive     One CABG, One CABG, One good health  . Sister Alive   . Sister Alive   . Brother Alive   . Brother Alive    Allergies  Allergen Reactions  . Flagyl (Metronidazole Hcl) Other (See Comments)    "makes me feel whoosy and weird"       Objective:   Physical Exam Filed  Vitals:   06/26/11 1039  Height: 5\' 2"  (1.575 m)  Weight: 136 lb 6.4 oz (61.871 kg)    Alert and oriented. Skin warm and dry. Oral mucosa is moist.   . Sclera anicteric, conjunctivae is pink. Thyroid not enlarged. No cervical lymphadenopathy. Lungs clear. Heart regular rate and rhythm.  Abdomen is soft. Bowel sounds are positive. No hepatomegaly. No abdominal masses felt. No tenderness.  No edema to lower extremities. Patient is alert and oriented.     Assessment:    Crohn's disease which appears to be in remission.     Plan:    F/u in 6 months.  CBC and sedrate.

## 2011-06-26 NOTE — Patient Instructions (Signed)
CBC and Sed rate today. F/u in 6 months. Continue present medications.

## 2011-07-07 DIAGNOSIS — K5090 Crohn's disease, unspecified, without complications: Secondary | ICD-10-CM | POA: Diagnosis not present

## 2011-07-07 DIAGNOSIS — I10 Essential (primary) hypertension: Secondary | ICD-10-CM | POA: Diagnosis not present

## 2011-11-24 ENCOUNTER — Other Ambulatory Visit (HOSPITAL_COMMUNITY): Payer: Self-pay | Admitting: Family Medicine

## 2011-11-24 DIAGNOSIS — I10 Essential (primary) hypertension: Secondary | ICD-10-CM | POA: Diagnosis not present

## 2011-11-24 DIAGNOSIS — H6120 Impacted cerumen, unspecified ear: Secondary | ICD-10-CM | POA: Diagnosis not present

## 2011-11-24 DIAGNOSIS — Z139 Encounter for screening, unspecified: Secondary | ICD-10-CM

## 2011-11-24 DIAGNOSIS — M159 Polyosteoarthritis, unspecified: Secondary | ICD-10-CM | POA: Diagnosis not present

## 2011-11-24 DIAGNOSIS — Z6825 Body mass index (BMI) 25.0-25.9, adult: Secondary | ICD-10-CM | POA: Diagnosis not present

## 2011-11-28 ENCOUNTER — Other Ambulatory Visit (HOSPITAL_COMMUNITY): Payer: PRIVATE HEALTH INSURANCE

## 2011-12-05 ENCOUNTER — Ambulatory Visit (HOSPITAL_COMMUNITY)
Admission: RE | Admit: 2011-12-05 | Discharge: 2011-12-05 | Disposition: A | Discharge status: Home / Self Care | Payer: Medicare Other | Source: Ambulatory Visit | Attending: Family Medicine | Admitting: Family Medicine

## 2011-12-05 DIAGNOSIS — Z780 Asymptomatic menopausal state: Secondary | ICD-10-CM | POA: Insufficient documentation

## 2011-12-05 DIAGNOSIS — F17200 Nicotine dependence, unspecified, uncomplicated: Secondary | ICD-10-CM | POA: Insufficient documentation

## 2011-12-05 DIAGNOSIS — M899 Disorder of bone, unspecified: Secondary | ICD-10-CM | POA: Diagnosis not present

## 2011-12-05 DIAGNOSIS — Z139 Encounter for screening, unspecified: Secondary | ICD-10-CM

## 2011-12-05 DIAGNOSIS — Z13820 Encounter for screening for osteoporosis: Secondary | ICD-10-CM | POA: Insufficient documentation

## 2011-12-05 DIAGNOSIS — M949 Disorder of cartilage, unspecified: Secondary | ICD-10-CM | POA: Diagnosis not present

## 2011-12-25 ENCOUNTER — Encounter (INDEPENDENT_AMBULATORY_CARE_PROVIDER_SITE_OTHER): Payer: Self-pay | Admitting: *Deleted

## 2012-01-07 ENCOUNTER — Ambulatory Visit (INDEPENDENT_AMBULATORY_CARE_PROVIDER_SITE_OTHER): Payer: Medicare Other | Admitting: Internal Medicine

## 2012-01-08 ENCOUNTER — Encounter (INDEPENDENT_AMBULATORY_CARE_PROVIDER_SITE_OTHER): Payer: Self-pay | Admitting: Internal Medicine

## 2012-01-08 ENCOUNTER — Ambulatory Visit (INDEPENDENT_AMBULATORY_CARE_PROVIDER_SITE_OTHER): Payer: Medicare Other | Admitting: Internal Medicine

## 2012-01-08 VITALS — BP 104/70 | HR 60 | Temp 97.5°F | Ht 61.0 in | Wt 139.8 lb

## 2012-01-08 DIAGNOSIS — K5090 Crohn's disease, unspecified, without complications: Secondary | ICD-10-CM | POA: Diagnosis not present

## 2012-01-08 LAB — CBC WITH DIFFERENTIAL/PLATELET
Basophils Absolute: 0 10*3/uL (ref 0.0–0.1)
Eosinophils Absolute: 0.1 10*3/uL (ref 0.0–0.7)
Eosinophils Relative: 1 % (ref 0–5)
Lymphocytes Relative: 24 % (ref 12–46)
Lymphs Abs: 2.5 10*3/uL (ref 0.7–4.0)
Neutrophils Relative %: 70 % (ref 43–77)
Platelets: 247 10*3/uL (ref 150–400)
RBC: 4.08 MIL/uL (ref 3.87–5.11)
RDW: 14 % (ref 11.5–15.5)
WBC: 10.7 10*3/uL — ABNORMAL HIGH (ref 4.0–10.5)

## 2012-01-08 LAB — SEDIMENTATION RATE: Sed Rate: 17 mm/hr (ref 0–22)

## 2012-01-08 NOTE — Patient Instructions (Addendum)
Cbc, sed rate. OV in 6 months. Continue present medications.

## 2012-01-08 NOTE — Progress Notes (Addendum)
Subjective:     Patient ID: [name], female   DOB: April 24, 1946, 66 y.o.   MRN: [id number]  HPI [name] is a 66 yr old female here today for follow up of her Crohn's.  She says she is doing pretty good. She says her bones her.  Her appetite is good. No weight loss.  She does her own lawn care. No abdominal pain. She has a BM daily and sometimes she misses a day. No melena or rectal bleeding. She was diagnosed with Crohn's on CT in 2006.   In 2008 she underwent a resection of terminal ileum and cecum, ileocectomy for a intra-abdominal abscess by DR. Arnoldo Morale for a hx of Crohn's.    Marland Kitchen  PROCEDURE: Resection of terminal ileum and cecum, ileocectomy   CBC    Component Value Date/Time   WBC 9.1 06/26/2011 1053   RBC 3.97 06/26/2011 1053   HGB 12.5 06/26/2011 1053   HCT 39.3 06/26/2011 1053   PLT 267 06/26/2011 1053   MCV 99.0 06/26/2011 1053   MCH 31.5 06/26/2011 1053   MCHC 31.8 06/26/2011 1053   RDW 12.4 06/26/2011 1053   LYMPHSABS 1.1 05/31/2011 0849   MONOABS 0.8 05/31/2011 0849   EOSABS 0.0 05/31/2011 0849   BASOSABS 0.0 05/31/2011 0849  06/26/2011 Sed rate 28.      Review of Systems see hpi    Current Outpatient Prescriptions  Medication Sig Dispense Refill  . alendronate (FOSAMAX) 70 MG tablet Take 70 mg by mouth every 7 (seven) days. Take with a full glass of water on an empty stomach.      Marland Kitchen aspirin 81 MG tablet Take 81 mg by mouth daily.       Marland Kitchen lisinopril (PRINIVIL,ZESTRIL) 20 MG tablet Take 1/2 tablet po qd  20 tablet  0  . mesalamine (ASACOL) 400 MG EC tablet Take 800 mg by mouth 2 (two) times daily.       . metoprolol (LOPRESSOR) 100 MG tablet Take 50 mg by mouth daily.       . naproxen (NAPROSYN) 500 MG tablet Take 500 mg by mouth 2 (two) times daily as needed. For arthritis pain      . ondansetron (ZOFRAN) 4 MG tablet Take 4 mg by mouth every 8 (eight) hours as needed.      Marland Kitchen DISCONTD: lisinopril (PRINIVIL,ZESTRIL) 20 MG tablet Take 10 mg by mouth daily.        Past Medical  History  Diagnosis Date  . Crohn's disease   . Hypertension   . Stroke   . Arthritis   . Sciatica of right side   . Degenerative disc disease, lumbar   . Ventral hernia    History   Social History  . Marital Status: Widowed    Spouse Name: N/A    Number of Children: N/A  . Years of Education: N/A   Occupational History  . Not on file.   Social History Main Topics  . Smoking status: Current Everyday Smoker -- 1.5 packs/day for 30 years  . Smokeless tobacco: Not on file   Comment: 1-2 pack day x 20 yrs.  . Alcohol Use: No     one every 2 -3 months.  . Drug Use: Not on file  . Sexually Active: Not on file   Other Topics Concern  . Not on file   Social History Narrative  . No narrative on file   Family Status  Relation Status Death Age  . Father Deceased  Stomach cancer  . Brother Alive     One CVA, One Cardiac stent, CAD. One back  surgery, Head cancer.  . Mother Deceased     MI  . Sister Alive     One CABG, One CABG, One good health  . Sister Alive   . Sister Alive   . Brother Alive   . Brother Alive    Allergies  Allergen Reactions  . Flagyl (Metronidazole Hcl) Other (See Comments)    "makes me feel whoosy and weird"    Objective:   Physical Exam Filed Vitals:   01/08/12 0949  Height: 5' 1"  (1.549 m)  Weight: 139 lb 12.8 oz (63.413 kg)        Assessment:    Crohn's disease which appears to be in remission.    Plan:    CBC, Sedrate. F/u 6 months.

## 2012-01-19 ENCOUNTER — Encounter (HOSPITAL_COMMUNITY): Payer: Self-pay | Admitting: *Deleted

## 2012-01-19 ENCOUNTER — Emergency Department (HOSPITAL_COMMUNITY)
Admission: EM | Admit: 2012-01-19 | Discharge: 2012-01-19 | Disposition: A | Discharge status: Home / Self Care | Payer: Medicare Other | Attending: Emergency Medicine | Admitting: Emergency Medicine

## 2012-01-19 DIAGNOSIS — F17200 Nicotine dependence, unspecified, uncomplicated: Secondary | ICD-10-CM | POA: Diagnosis not present

## 2012-01-19 DIAGNOSIS — Z7982 Long term (current) use of aspirin: Secondary | ICD-10-CM | POA: Diagnosis not present

## 2012-01-19 DIAGNOSIS — K5090 Crohn's disease, unspecified, without complications: Secondary | ICD-10-CM | POA: Insufficient documentation

## 2012-01-19 DIAGNOSIS — N390 Urinary tract infection, site not specified: Secondary | ICD-10-CM | POA: Insufficient documentation

## 2012-01-19 DIAGNOSIS — Z79899 Other long term (current) drug therapy: Secondary | ICD-10-CM | POA: Insufficient documentation

## 2012-01-19 DIAGNOSIS — Z881 Allergy status to other antibiotic agents status: Secondary | ICD-10-CM | POA: Insufficient documentation

## 2012-01-19 DIAGNOSIS — R109 Unspecified abdominal pain: Secondary | ICD-10-CM | POA: Diagnosis not present

## 2012-01-19 DIAGNOSIS — Z8673 Personal history of transient ischemic attack (TIA), and cerebral infarction without residual deficits: Secondary | ICD-10-CM | POA: Insufficient documentation

## 2012-01-19 DIAGNOSIS — I10 Essential (primary) hypertension: Secondary | ICD-10-CM | POA: Insufficient documentation

## 2012-01-19 DIAGNOSIS — R350 Frequency of micturition: Secondary | ICD-10-CM | POA: Diagnosis not present

## 2012-01-19 DIAGNOSIS — R3915 Urgency of urination: Secondary | ICD-10-CM | POA: Diagnosis not present

## 2012-01-19 DIAGNOSIS — R300 Dysuria: Secondary | ICD-10-CM | POA: Diagnosis not present

## 2012-01-19 LAB — URINALYSIS, ROUTINE W REFLEX MICROSCOPIC
Bilirubin Urine: NEGATIVE
Nitrite: NEGATIVE
Specific Gravity, Urine: 1.01 (ref 1.005–1.030)
Urobilinogen, UA: 0.2 mg/dL (ref 0.0–1.0)

## 2012-01-19 LAB — URINE MICROSCOPIC-ADD ON

## 2012-01-19 MED ORDER — NITROFURANTOIN MACROCRYSTAL 100 MG PO CAPS
100.0000 mg | ORAL_CAPSULE | Freq: Once | ORAL | Status: AC
  Administered 2012-01-19: 100 mg via ORAL
  Filled 2012-01-19: qty 1

## 2012-01-19 MED ORDER — NITROFURANTOIN MACROCRYSTAL 100 MG PO CAPS: 100.0000 mg | ORAL_CAPSULE | Freq: Two times a day (BID) | ORAL | Status: AC

## 2012-01-19 NOTE — Discharge Instructions (Signed)
Drink lots of fluids. Take all of the antibiotic. Follow up with your doctor.   Urinary Tract Infection A urinary tract infection (UTI) is often caused by a germ (bacteria). A UTI is usually helped with medicine (antibiotics) that kills germs. Take all the medicine until it is gone. Do this even if you are feeling better. You are usually better in 7 to 10 days. HOME CARE   Drink enough water and fluids to keep your pee (urine) clear or pale yellow. Drink:   Cranberry juice.   Water.   Avoid:   Caffeine.   Tea.   Bubbly (carbonated) drinks.   Alcohol.   Only take medicine as told by your doctor.   To prevent further infections:   Pee often.   After pooping (bowel movement), women should wipe from front to back. Use each tissue only once.   Pee before and after having sex (intercourse).  Ask your doctor when your test results will be ready. Make sure you follow up and get your test results.  GET HELP RIGHT AWAY IF:   There is very bad back pain or lower belly (abdominal) pain.   You get the chills.   You have a fever.   Your baby is older than 3 months with a rectal temperature of 102 F (38.9 C) or higher.   Your baby is 25 months old or younger with a rectal temperature of 100.4 F (38 C) or higher.   You feel sick to your stomach (nauseous) or throw up (vomit).   There is continued burning with peeing.   Your problems are not better in 3 days. Return sooner if you are getting worse.  MAKE SURE YOU:   Understand these instructions.   Will watch your condition.   Will get help right away if you are not doing well or get worse.  Document Released: 11/19/2007 Document Revised: 05/22/2011 Document Reviewed: 11/19/2007 [facility] Patient Information 2012 [place].

## 2012-01-19 NOTE — ED Notes (Signed)
Pt discharged. Pt stable at time of discharge. Medications reviewed pt has no questions regarding discharge at this time. Pt voiced understanding of discharge instructions.  

## 2012-01-19 NOTE — ED Notes (Signed)
Pt arrived via ems from home d/t lower abd pain and burning with urination. Pt states she has hx of re occurrent uti and this feels the same.

## 2012-01-19 NOTE — ED Provider Notes (Signed)
History     CSN: [id number]  Arrival date & time 01/19/12  0308   First MD Initiated Contact with Patient 01/19/12 [phone]      Chief Complaint  Patient presents with  . Abdominal Pain    (Consider location/radiation/quality/duration/timing/severity/associated sxs/prior treatment) HPI  [name] is a 66 y.o. female brought in by ambulance, who presents to the Emergency Department complaining of lower abdominal pain associated with urinary urgency and frequency that began yesterday and got worse last night. Denies fever, chills, nausea, vomitiing, diarrhea.  PCP Dr. Regino Schultze Past Medical History  Diagnosis Date  . Crohn's disease   . Hypertension   . Stroke   . Arthritis   . Sciatica of right side   . Degenerative disc disease, lumbar   . Ventral hernia     Past Surgical History  Procedure Date  . Cholecystectomy     2008  . Carotid endarterectomy     2004  . Hemicolectomy     right  . Tubal ligation     Family History  Problem Relation Age of Onset  . GI problems Father   . Stroke Brother   . Heart attack Mother   . Hypotension Sister     History  Substance Use Topics  . Smoking status: Current Everyday Smoker -- 1.5 packs/day for 30 years  . Smokeless tobacco: Not on file   Comment: 1-2 pack day x 20 yrs.  . Alcohol Use: No     one every 2 -3 months.    OB History    Grav Para Term Preterm Abortions TAB SAB Ect Mult Living                  Review of Systems  Constitutional: Negative for fever.       10 Systems reviewed and are negative for acute change except as noted in the HPI.  HENT: Negative for congestion.   Eyes: Negative for discharge and redness.  Respiratory: Negative for cough and shortness of breath.   Cardiovascular: Negative for chest pain.  Gastrointestinal: Positive for abdominal pain. Negative for vomiting.  Genitourinary: Positive for urgency.  Musculoskeletal: Negative for back pain.  Skin: Negative for rash.  Neurological:  Negative for syncope, numbness and headaches.  Psychiatric/Behavioral:       No behavior change.    Allergies  Flagyl  Home Medications   Current Outpatient Rx  Name Route Sig Dispense Refill  . ALENDRONATE SODIUM 70 MG PO TABS Oral Take 70 mg by mouth every 7 (seven) days. Take with a full glass of water on an empty stomach.    . ASPIRIN 81 MG PO TABS Oral Take 81 mg by mouth daily.     Marland Kitchen LISINOPRIL 20 MG PO TABS  Take 1/2 tablet po qd 20 tablet 0  . MESALAMINE 400 MG PO TBEC Oral Take 800 mg by mouth 2 (two) times daily.     Marland Kitchen METOPROLOL TARTRATE 100 MG PO TABS Oral Take 50 mg by mouth daily.     Marland Kitchen NAPROXEN 500 MG PO TABS Oral Take 500 mg by mouth 2 (two) times daily as needed. For arthritis pain    . ONDANSETRON HCL 4 MG PO TABS Oral Take 4 mg by mouth every 8 (eight) hours as needed.      BP 115/60  Pulse 69  Temp 98.3 F (36.8 C) (Oral)  Resp 18  Ht 5\' 1"  (1.549 m)  Wt 137 lb (62.143 kg)  BMI 25.89  kg/m2  SpO2 97%  Physical Exam  Nursing note and vitals reviewed. Constitutional:       Awake, alert, nontoxic appearance.  HENT:  Head: Atraumatic.  Eyes: Right eye exhibits no discharge. Left eye exhibits no discharge.  Neck: Neck supple.  Pulmonary/Chest: Effort normal. She exhibits no tenderness.  Abdominal: Soft. There is no tenderness. There is no rebound.       Suprapubic tenderness to palpation  Genitourinary:       No CVA tenderness to percussion  Musculoskeletal: She exhibits no tenderness.       Baseline ROM, no obvious new focal weakness.  Neurological:       Mental status and motor strength appears baseline for patient and situation.  Skin: No rash noted.  Psychiatric: She has a normal mood and affect.    ED Course  Procedures (including critical care time)  Labs Reviewed  URINALYSIS, ROUTINE W REFLEX MICROSCOPIC - Abnormal; Notable for the following:    APPearance HAZY (*)     Hgb urine dipstick LARGE (*)     Protein, ur TRACE (*)      Leukocytes, UA LARGE (*)     All other components within normal limits  URINE MICROSCOPIC-ADD ON - Abnormal; Notable for the following:    Bacteria, UA MANY (*)     All other components within normal limits      MDM  Patient presnts with UTI symptoms, abdominal pain, urinary frequency and urgency.UA is positive for infection. Initiated antibiotic therapy.Pt stable in ED with no significant deterioration in condition.The patient appears reasonably screened and/or stabilized for discharge and I doubt any other medical condition or other [facility] requiring further screening, evaluation, or treatment in the ED at this time prior to discharge.  MDM Reviewed: nursing note and vitals Interpretation: labs           Nicoletta Dress. Colon Branch, MD 01/19/12 [phone]

## 2012-02-10 NOTE — Progress Notes (Signed)
I already have results

## 2012-02-27 DIAGNOSIS — D1739 Benign lipomatous neoplasm of skin and subcutaneous tissue of other sites: Secondary | ICD-10-CM | POA: Diagnosis not present

## 2012-02-27 DIAGNOSIS — R300 Dysuria: Secondary | ICD-10-CM | POA: Diagnosis not present

## 2012-02-27 DIAGNOSIS — Z6826 Body mass index (BMI) 26.0-26.9, adult: Secondary | ICD-10-CM | POA: Diagnosis not present

## 2012-03-10 DIAGNOSIS — B351 Tinea unguium: Secondary | ICD-10-CM | POA: Diagnosis not present

## 2012-03-10 DIAGNOSIS — M79609 Pain in unspecified limb: Secondary | ICD-10-CM | POA: Diagnosis not present

## 2012-03-10 DIAGNOSIS — I739 Peripheral vascular disease, unspecified: Secondary | ICD-10-CM | POA: Diagnosis not present

## 2012-04-20 DIAGNOSIS — Z23 Encounter for immunization: Secondary | ICD-10-CM | POA: Diagnosis not present

## 2012-05-19 DIAGNOSIS — M79609 Pain in unspecified limb: Secondary | ICD-10-CM | POA: Diagnosis not present

## 2012-05-19 DIAGNOSIS — I739 Peripheral vascular disease, unspecified: Secondary | ICD-10-CM | POA: Diagnosis not present

## 2012-05-19 DIAGNOSIS — B351 Tinea unguium: Secondary | ICD-10-CM | POA: Diagnosis not present

## 2012-06-11 ENCOUNTER — Encounter (INDEPENDENT_AMBULATORY_CARE_PROVIDER_SITE_OTHER): Payer: Self-pay | Admitting: *Deleted

## 2012-07-08 ENCOUNTER — Telehealth (INDEPENDENT_AMBULATORY_CARE_PROVIDER_SITE_OTHER): Payer: Self-pay | Admitting: *Deleted

## 2012-07-08 ENCOUNTER — Ambulatory Visit (INDEPENDENT_AMBULATORY_CARE_PROVIDER_SITE_OTHER): Payer: Medicare Other | Admitting: Internal Medicine

## 2012-07-08 ENCOUNTER — Encounter (INDEPENDENT_AMBULATORY_CARE_PROVIDER_SITE_OTHER): Payer: Self-pay | Admitting: Internal Medicine

## 2012-07-08 VITALS — BP 104/68 | HR 56 | Temp 97.8°F | Ht 62.0 in | Wt 142.4 lb

## 2012-07-08 DIAGNOSIS — K5010 Crohn's disease of large intestine without complications: Secondary | ICD-10-CM

## 2012-07-08 DIAGNOSIS — K5090 Crohn's disease, unspecified, without complications: Secondary | ICD-10-CM | POA: Diagnosis not present

## 2012-07-08 LAB — CBC WITH DIFFERENTIAL/PLATELET
Eosinophils Absolute: 0.2 10*3/uL (ref 0.0–0.7)
Eosinophils Relative: 2 % (ref 0–5)
HCT: 39 % (ref 36.0–46.0)
Lymphocytes Relative: 24 % (ref 12–46)
Lymphs Abs: 2 10*3/uL (ref 0.7–4.0)
MCH: 32.4 pg (ref 26.0–34.0)
MCV: 94.9 fL (ref 78.0–100.0)
Monocytes Absolute: 0.5 10*3/uL (ref 0.1–1.0)
Monocytes Relative: 6 % (ref 3–12)
RBC: 4.11 MIL/uL (ref 3.87–5.11)
WBC: 8.5 10*3/uL (ref 4.0–10.5)

## 2012-07-08 NOTE — Patient Instructions (Addendum)
OV in 1 yr. CBC and sed rate today.

## 2012-07-08 NOTE — Telephone Encounter (Signed)
Per Dorene Ar, NP the patient will need to have lab work in 1 year.

## 2012-07-08 NOTE — Progress Notes (Signed)
Subjective:     Patient ID: [name], female   DOB: 16-May-1946, 67 y.o.   MRN: [id number]  HPIGail is a 67 yr old female here for f/u today. She has a hx of Crohn's. She was last seen in July of 2013 for f/u. She tells me she is doing good. No abdominal pain. She tells me her BMs are good. She usually has 2-3 stools a day. No melena or bright red rectal bleeding. Appetite is good. No weight loss.  She was diagnosed with Crohn's on CT in 2006.  In 2008 she underwent a resection of terminal ileum and cecum, ileocectomy for a intra-abdominal abscess by DR. Arnoldo Morale for a hx of Crohn's.  Marland KitchenPROCEDURE: Resection of terminal ileum and cecum, ileocectomy    She has never undergone a colonoscopy and declined today.  01/08/2012 sedrate 17 CBC    Component Value Date/Time   WBC 10.7* 01/08/2012 1039   RBC 4.08 01/08/2012 1039   HGB 13.2 01/08/2012 1039   HCT 40.2 01/08/2012 1039   PLT 247 01/08/2012 1039   MCV 98.5 01/08/2012 1039   MCH 32.4 01/08/2012 1039   MCHC 32.8 01/08/2012 1039   RDW 14.0 01/08/2012 1039   LYMPHSABS 2.5 01/08/2012 1039   MONOABS 0.6 01/08/2012 1039   EOSABS 0.1 01/08/2012 1039   BASOSABS 0.0 01/08/2012 1039     Review of Systems see hpi Current Outpatient Prescriptions  Medication Sig Dispense Refill  . aspirin 81 MG tablet Take 81 mg by mouth daily.       Marland Kitchen lisinopril (PRINIVIL,ZESTRIL) 20 MG tablet Take 1/2 tablet po qd  20 tablet  0  . mesalamine (ASACOL) 400 MG EC tablet Take 800 mg by mouth 2 (two) times daily.       . metoprolol (LOPRESSOR) 100 MG tablet Take 50 mg by mouth daily.       . naproxen (NAPROSYN) 500 MG tablet Take 500 mg by mouth 2 (two) times daily as needed. For arthritis pain      . ondansetron (ZOFRAN) 4 MG tablet Take 4 mg by mouth every 8 (eight) hours as needed.      . phenylephrine (SUDAFED PE) 10 MG TABS Take 10 mg by mouth every 4 (four) hours as needed.       Past Medical History  Diagnosis Date  . Crohn's disease   . Hypertension   .  Stroke   . Arthritis   . Sciatica of right side   . Degenerative disc disease, lumbar   . Ventral hernia    Past Surgical History  Procedure Date  . Cholecystectomy     2008  . Carotid endarterectomy     2004  . Hemicolectomy     right  . Tubal ligation    Allergies  Allergen Reactions  . Flagyl (Metronidazole Hcl) Other (See Comments)    "makes me feel whoosy and weird"        Objective:   Physical Exam Filed Vitals:   07/08/12 1039  BP: 104/68  Pulse: 56  Temp: 97.8 F (36.6 C)  Height: 5' 2"  (1.575 m)  Weight: 142 lb 6.4 oz (64.592 kg)   Alert and oriented. Skin warm and dry. Oral mucosa is moist.   . Sclera anicteric, conjunctivae is pink. Thyroid not enlarged. No cervical lymphadenopathy. Lungs clear. Heart regular rate and rhythm.  Abdomen is soft. Bowel sounds are positive. No hepatomegaly. No abdominal masses felt. No tenderness.  No edema to lower extremities.  Stool  brown and guaiac negative     Assessment:    Crohn's which appears to be in remission. She is doing well. Patient declined a surveillance colonoscopy today.    Plan:    OV in 1 yr.  With a CBC and sed rate.

## 2012-07-28 DIAGNOSIS — M79609 Pain in unspecified limb: Secondary | ICD-10-CM | POA: Diagnosis not present

## 2012-07-28 DIAGNOSIS — B351 Tinea unguium: Secondary | ICD-10-CM | POA: Diagnosis not present

## 2012-07-28 DIAGNOSIS — I739 Peripheral vascular disease, unspecified: Secondary | ICD-10-CM | POA: Diagnosis not present

## 2012-09-02 ENCOUNTER — Other Ambulatory Visit (INDEPENDENT_AMBULATORY_CARE_PROVIDER_SITE_OTHER): Payer: Self-pay | Admitting: Internal Medicine

## 2012-10-29 DIAGNOSIS — I739 Peripheral vascular disease, unspecified: Secondary | ICD-10-CM | POA: Diagnosis not present

## 2012-10-29 DIAGNOSIS — M79609 Pain in unspecified limb: Secondary | ICD-10-CM | POA: Diagnosis not present

## 2012-10-29 DIAGNOSIS — B351 Tinea unguium: Secondary | ICD-10-CM | POA: Diagnosis not present

## 2012-12-14 ENCOUNTER — Encounter (HOSPITAL_COMMUNITY): Payer: Self-pay | Admitting: *Deleted

## 2012-12-14 ENCOUNTER — Emergency Department (HOSPITAL_COMMUNITY)
Admission: EM | Admit: 2012-12-14 | Discharge: 2012-12-15 | Disposition: A | Discharge status: Home / Self Care | Payer: Medicare Other | Attending: Emergency Medicine | Admitting: Emergency Medicine

## 2012-12-14 DIAGNOSIS — K8689 Other specified diseases of pancreas: Secondary | ICD-10-CM | POA: Diagnosis not present

## 2012-12-14 DIAGNOSIS — R109 Unspecified abdominal pain: Secondary | ICD-10-CM | POA: Diagnosis not present

## 2012-12-14 DIAGNOSIS — Z8673 Personal history of transient ischemic attack (TIA), and cerebral infarction without residual deficits: Secondary | ICD-10-CM | POA: Diagnosis not present

## 2012-12-14 DIAGNOSIS — K439 Ventral hernia without obstruction or gangrene: Secondary | ICD-10-CM | POA: Insufficient documentation

## 2012-12-14 DIAGNOSIS — Z79899 Other long term (current) drug therapy: Secondary | ICD-10-CM | POA: Insufficient documentation

## 2012-12-14 DIAGNOSIS — I10 Essential (primary) hypertension: Secondary | ICD-10-CM | POA: Diagnosis not present

## 2012-12-14 DIAGNOSIS — Z8719 Personal history of other diseases of the digestive system: Secondary | ICD-10-CM | POA: Diagnosis not present

## 2012-12-14 DIAGNOSIS — Z8739 Personal history of other diseases of the musculoskeletal system and connective tissue: Secondary | ICD-10-CM | POA: Diagnosis not present

## 2012-12-14 DIAGNOSIS — F17200 Nicotine dependence, unspecified, uncomplicated: Secondary | ICD-10-CM | POA: Insufficient documentation

## 2012-12-14 DIAGNOSIS — Z7982 Long term (current) use of aspirin: Secondary | ICD-10-CM | POA: Diagnosis not present

## 2012-12-14 DIAGNOSIS — R1012 Left upper quadrant pain: Secondary | ICD-10-CM | POA: Diagnosis not present

## 2012-12-14 DIAGNOSIS — R142 Eructation: Secondary | ICD-10-CM | POA: Diagnosis not present

## 2012-12-14 LAB — URINALYSIS, ROUTINE W REFLEX MICROSCOPIC
Bilirubin Urine: NEGATIVE
Glucose, UA: NEGATIVE mg/dL
Ketones, ur: NEGATIVE mg/dL
Nitrite: NEGATIVE
Protein, ur: NEGATIVE mg/dL
Specific Gravity, Urine: <1.005 — ABNORMAL LOW (ref 1.005–1.030)
Urobilinogen, UA: 0.2 mg/dL (ref 0.0–1.0)
pH: 6 (ref 5.0–8.0)

## 2012-12-14 LAB — URINE MICROSCOPIC-ADD ON

## 2012-12-14 NOTE — ED Notes (Signed)
Pt reporting pain in LLQ.  Reports pain lessens at night, but gets worse during the day.  Pt states "I don't know if it's from my hernia or chron's or what."  Reports intermittent nausea, no vomiting.  Denies diarrhea.  Last BM this afternoon.

## 2012-12-15 ENCOUNTER — Emergency Department (HOSPITAL_COMMUNITY): Payer: Medicare Other

## 2012-12-15 DIAGNOSIS — R141 Gas pain: Secondary | ICD-10-CM | POA: Diagnosis not present

## 2012-12-15 DIAGNOSIS — K8689 Other specified diseases of pancreas: Secondary | ICD-10-CM | POA: Diagnosis not present

## 2012-12-15 LAB — COMPREHENSIVE METABOLIC PANEL
AST: 18 U/L (ref 0–37)
Albumin: 3.7 g/dL (ref 3.5–5.2)
Alkaline Phosphatase: 70 U/L (ref 39–117)
BUN: 9 mg/dL (ref 6–23)
Potassium: 3.5 mEq/L (ref 3.5–5.1)
Sodium: 138 mEq/L (ref 135–145)
Total Protein: 7.2 g/dL (ref 6.0–8.3)

## 2012-12-15 LAB — CBC WITH DIFFERENTIAL/PLATELET
Eosinophils Relative: 2 % (ref 0–5)
HCT: 38.6 % (ref 36.0–46.0)
Lymphocytes Relative: 34 % (ref 12–46)
Lymphs Abs: 2.6 10*3/uL (ref 0.7–4.0)
MCH: 33.1 pg (ref 26.0–34.0)
MCV: 95.3 fL (ref 78.0–100.0)
Monocytes Absolute: 0.5 10*3/uL (ref 0.1–1.0)
RDW: 12.6 % (ref 11.5–15.5)
WBC: 7.5 10*3/uL (ref 4.0–10.5)

## 2012-12-15 MED ORDER — IOHEXOL 300 MG/ML  SOLN
50.0000 mL | Freq: Once | INTRAMUSCULAR | Status: AC | PRN
  Administered 2012-12-15: 50 mL via ORAL

## 2012-12-15 MED ORDER — IOHEXOL 300 MG/ML  SOLN
100.0000 mL | Freq: Once | INTRAMUSCULAR | Status: AC | PRN
  Administered 2012-12-15: 100 mL via INTRAVENOUS

## 2012-12-15 MED ORDER — MORPHINE SULFATE 4 MG/ML IJ SOLN
4.0000 mg | Freq: Once | INTRAMUSCULAR | Status: AC
  Administered 2012-12-15: 4 mg via INTRAVENOUS
  Filled 2012-12-15: qty 1

## 2012-12-15 NOTE — ED Notes (Signed)
Pt ambulated to BR and back to room with no difficulty.  No complaints at present time.  Pt reporting improvement in pain level.

## 2012-12-15 NOTE — ED Provider Notes (Signed)
History    CSN: [id number] Arrival date & time 12/14/12  2042  First MD Initiated Contact with Patient 12/15/12 0021     Chief Complaint  Patient presents with  . Abdominal Pain    Patient is a 67 y.o. female presenting with abdominal pain. The history is provided by the patient.  Abdominal Pain This is a recurrent problem. The current episode started more than 2 days ago. The problem occurs daily. The problem has been gradually worsening. Associated symptoms include abdominal pain. Pertinent negatives include no chest pain and no shortness of breath. Exacerbated by: palpation. The symptoms are relieved by rest. She has tried rest for the symptoms. The treatment provided no relief.  pt reports intermittent abdominal pain (mostly LLQ) for 2 weeks but it seems to be worsening No fever/vomiting.  She had normal bowel movement earlier today.  She is not passing flatus No diarrhea reported Past Medical History  Diagnosis Date  . Crohn's disease   . Hypertension   . Stroke   . Arthritis   . Sciatica of right side   . Degenerative disc disease, lumbar   . Ventral hernia    Past Surgical History  Procedure Laterality Date  . Cholecystectomy      2008  . Carotid endarterectomy      2004  . Hemicolectomy      right  . Tubal ligation     Family History  Problem Relation Age of Onset  . GI problems Father   . Stroke Brother   . Heart attack Mother   . Hypotension Sister    History  Substance Use Topics  . Smoking status: Current Every Day Smoker -- 1.00 packs/day for 30 years  . Smokeless tobacco: Not on file     Comment: 1-2 pack day x 20 yrs.  . Alcohol Use: No     Comment: one every 2 -3 months.   OB History   Grav Para Term Preterm Abortions TAB SAB Ect Mult Living                 Review of Systems  Constitutional: Negative for fever.  Respiratory: Negative for shortness of breath.   Cardiovascular: Negative for chest pain.  Gastrointestinal: Positive for  abdominal pain. Negative for vomiting and diarrhea.  Musculoskeletal: Negative for back pain.  Neurological: Negative for weakness.  All other systems reviewed and are negative.    Allergies  Flagyl  Home Medications   Current Outpatient Rx  Name  Route  Sig  Dispense  Refill  . aspirin 81 MG tablet   Oral   Take 81 mg by mouth daily.          . DELZICOL 400 MG CPDR      TAKE 2 CAPSULES BY MOUTH TWICE DAILY.   120 capsule   5   . lisinopril (PRINIVIL,ZESTRIL) 20 MG tablet      Take 1/2 tablet po qd   20 tablet   0   . metoprolol (LOPRESSOR) 100 MG tablet   Oral   Take 50 mg by mouth daily.          . naproxen (NAPROSYN) 500 MG tablet   Oral   Take 500 mg by mouth 2 (two) times daily as needed. For arthritis pain         . ondansetron (ZOFRAN) 4 MG tablet   Oral   Take 4 mg by mouth every 8 (eight) hours as needed.         Marland Kitchen  phenylephrine (SUDAFED PE) 10 MG TABS   Oral   Take 10 mg by mouth every 4 (four) hours as needed.          BP 151/77  Pulse 78  Temp(Src) 97.8 F (36.6 C) (Oral)  Resp 18  Ht 5\' 2"  (1.575 m)  Wt 140 lb (63.504 kg)  BMI 25.6 kg/m2  SpO2 99% BP 146/68  Pulse 65  Temp(Src) 97.6 F (36.4 C) (Oral)  Resp 20  Ht 5\' 2"  (1.575 m)  Wt 140 lb (63.504 kg)  BMI 25.6 kg/m2  SpO2 100%  Physical Exam CONSTITUTIONAL: Well developed/well nourished HEAD: Normocephalic/atraumatic EYES: EOMI/PERRL ENMT: Mucous membranes moist NECK: supple no meningeal signs SPINE:entire spine nontender CV: S1/S2 noted, no murmurs/rubs/gallops noted LUNGS: Lungs are clear to auscultation bilaterally, no apparent distress ABDOMEN: well healed scars noted soft, moderate tenderness in LUQ with distention and ventral hernia noted but no firm mass noted to abdominal wall, decreased BS noted throughout, no rebound or guarding. No erythema or color changes to abdomen GU:no cva tenderness NEURO: Pt is awake/alert, moves all extremitiesx4 EXTREMITIES:  pulses normal, full ROM SKIN: warm, color normal PSYCH: no abnormalities of mood noted  ED Course  Procedures  Labs Reviewed  URINALYSIS, ROUTINE W REFLEX MICROSCOPIC - Abnormal; Notable for the following:    Specific Gravity, Urine <1.005 (*)    Hgb urine dipstick TRACE (*)    Leukocytes, UA SMALL (*)    All other components within normal limits  COMPREHENSIVE METABOLIC PANEL - Abnormal; Notable for the following:    Total Bilirubin 0.2 (*)    GFR calc non Af Amer 72 (*)    GFR calc Af Amer 83 (*)    All other components within normal limits  LIPASE, BLOOD - Abnormal; Notable for the following:    Lipase 60 (*)    All other components within normal limits  URINE MICROSCOPIC-ADD ON  CBC WITH DIFFERENTIAL  LACTIC ACID, PLASMA  2:45 AM Pt with continued abdominal tenderness despite pain meds. Will get CT imaging due to previous h/o SBO per chart Pt agreeable with plan 5:36 AM Pt resting comfortably.  She reports improvement.  She has had no vomiting Her labs are reassuring, and it appears her CT scan is at baseline without obstructive or infectious process She reports she has f/u with PCP this week.  At this point given how well she appears, no vomiting, I feel she is safe for d/c home We discussed return precautions   MDM  Nursing notes including past medical history and social history reviewed and considered in documentation xrays reviewed and considered Labs/vital reviewed and considered Previous records reviewed and considered     Date: 12/15/2012  Rate: 62  Rhythm: normal sinus rhythm  QRS Axis: normal  Intervals: normal  ST/T Wave abnormalities: normal  Conduction Disutrbances:none       Joya Gaskins, MD 12/15/12 [phone]

## 2012-12-21 DIAGNOSIS — Z6825 Body mass index (BMI) 25.0-25.9, adult: Secondary | ICD-10-CM | POA: Diagnosis not present

## 2012-12-21 DIAGNOSIS — K460 Unspecified abdominal hernia with obstruction, without gangrene: Secondary | ICD-10-CM | POA: Diagnosis not present

## 2012-12-21 DIAGNOSIS — I10 Essential (primary) hypertension: Secondary | ICD-10-CM | POA: Diagnosis not present

## 2013-01-21 DIAGNOSIS — B351 Tinea unguium: Secondary | ICD-10-CM | POA: Diagnosis not present

## 2013-01-21 DIAGNOSIS — M79609 Pain in unspecified limb: Secondary | ICD-10-CM | POA: Diagnosis not present

## 2013-01-21 DIAGNOSIS — I739 Peripheral vascular disease, unspecified: Secondary | ICD-10-CM | POA: Diagnosis not present

## 2013-04-01 DIAGNOSIS — I739 Peripheral vascular disease, unspecified: Secondary | ICD-10-CM | POA: Diagnosis not present

## 2013-04-01 DIAGNOSIS — B351 Tinea unguium: Secondary | ICD-10-CM | POA: Diagnosis not present

## 2013-04-08 ENCOUNTER — Ambulatory Visit (HOSPITAL_COMMUNITY)
Admission: RE | Admit: 2013-04-08 | Discharge: 2013-04-08 | Disposition: A | Discharge status: Home / Self Care | Payer: Medicare Other | Source: Ambulatory Visit | Attending: Family Medicine | Admitting: Family Medicine

## 2013-04-08 ENCOUNTER — Other Ambulatory Visit (HOSPITAL_COMMUNITY): Payer: Self-pay | Admitting: Family Medicine

## 2013-04-08 DIAGNOSIS — N390 Urinary tract infection, site not specified: Secondary | ICD-10-CM | POA: Diagnosis not present

## 2013-04-08 DIAGNOSIS — I10 Essential (primary) hypertension: Secondary | ICD-10-CM | POA: Diagnosis not present

## 2013-04-08 DIAGNOSIS — Z6825 Body mass index (BMI) 25.0-25.9, adult: Secondary | ICD-10-CM | POA: Diagnosis not present

## 2013-04-08 DIAGNOSIS — Z23 Encounter for immunization: Secondary | ICD-10-CM | POA: Diagnosis not present

## 2013-04-08 DIAGNOSIS — R05 Cough: Secondary | ICD-10-CM | POA: Diagnosis not present

## 2013-04-08 DIAGNOSIS — R059 Cough, unspecified: Secondary | ICD-10-CM | POA: Insufficient documentation

## 2013-04-13 DIAGNOSIS — K56609 Unspecified intestinal obstruction, unspecified as to partial versus complete obstruction: Secondary | ICD-10-CM | POA: Diagnosis not present

## 2013-04-13 DIAGNOSIS — K432 Incisional hernia without obstruction or gangrene: Secondary | ICD-10-CM | POA: Diagnosis not present

## 2013-05-14 ENCOUNTER — Encounter (HOSPITAL_COMMUNITY): Payer: Self-pay | Admitting: Emergency Medicine

## 2013-05-14 ENCOUNTER — Emergency Department (HOSPITAL_COMMUNITY): Payer: Medicare Other

## 2013-05-14 ENCOUNTER — Inpatient Hospital Stay (HOSPITAL_COMMUNITY)
Admission: EM | Admit: 2013-05-14 | Discharge: 2013-05-24 | DRG: 354 | Disposition: A | Discharge status: Home / Self Care | Payer: Medicare Other | Attending: General Surgery | Admitting: General Surgery

## 2013-05-14 DIAGNOSIS — K5000 Crohn's disease of small intestine without complications: Secondary | ICD-10-CM | POA: Diagnosis present

## 2013-05-14 DIAGNOSIS — Z823 Family history of stroke: Secondary | ICD-10-CM

## 2013-05-14 DIAGNOSIS — R109 Unspecified abdominal pain: Secondary | ICD-10-CM | POA: Diagnosis not present

## 2013-05-14 DIAGNOSIS — N390 Urinary tract infection, site not specified: Secondary | ICD-10-CM | POA: Diagnosis present

## 2013-05-14 DIAGNOSIS — M51379 Other intervertebral disc degeneration, lumbosacral region without mention of lumbar back pain or lower extremity pain: Secondary | ICD-10-CM | POA: Diagnosis present

## 2013-05-14 DIAGNOSIS — F17200 Nicotine dependence, unspecified, uncomplicated: Secondary | ICD-10-CM | POA: Diagnosis present

## 2013-05-14 DIAGNOSIS — Z8673 Personal history of transient ischemic attack (TIA), and cerebral infarction without residual deficits: Secondary | ICD-10-CM | POA: Diagnosis not present

## 2013-05-14 DIAGNOSIS — K5090 Crohn's disease, unspecified, without complications: Secondary | ICD-10-CM | POA: Diagnosis not present

## 2013-05-14 DIAGNOSIS — M5137 Other intervertebral disc degeneration, lumbosacral region: Secondary | ICD-10-CM | POA: Diagnosis present

## 2013-05-14 DIAGNOSIS — K56609 Unspecified intestinal obstruction, unspecified as to partial versus complete obstruction: Secondary | ICD-10-CM | POA: Diagnosis not present

## 2013-05-14 DIAGNOSIS — M129 Arthropathy, unspecified: Secondary | ICD-10-CM | POA: Diagnosis present

## 2013-05-14 DIAGNOSIS — Z8249 Family history of ischemic heart disease and other diseases of the circulatory system: Secondary | ICD-10-CM | POA: Diagnosis not present

## 2013-05-14 DIAGNOSIS — I10 Essential (primary) hypertension: Secondary | ICD-10-CM | POA: Diagnosis present

## 2013-05-14 DIAGNOSIS — R1012 Left upper quadrant pain: Secondary | ICD-10-CM | POA: Diagnosis not present

## 2013-05-14 DIAGNOSIS — K432 Incisional hernia without obstruction or gangrene: Secondary | ICD-10-CM | POA: Diagnosis not present

## 2013-05-14 DIAGNOSIS — K430 Incisional hernia with obstruction, without gangrene: Secondary | ICD-10-CM | POA: Diagnosis not present

## 2013-05-14 LAB — URINE MICROSCOPIC-ADD ON

## 2013-05-14 LAB — CBC WITH DIFFERENTIAL/PLATELET
Basophils Absolute: 0 10*3/uL (ref 0.0–0.1)
Basophils Relative: 0 % (ref 0–1)
Eosinophils Absolute: 0.1 10*3/uL (ref 0.0–0.7)
Eosinophils Relative: 1 % (ref 0–5)
Lymphocytes Relative: 17 % (ref 12–46)
Lymphs Abs: 2 10*3/uL (ref 0.7–4.0)
MCH: 32.6 pg (ref 26.0–34.0)
MCV: 95.5 fL (ref 78.0–100.0)
Neutrophils Relative %: 77 % (ref 43–77)
Platelets: 213 10*3/uL (ref 150–400)
RBC: 4.45 MIL/uL (ref 3.87–5.11)
RDW: 12.5 % (ref 11.5–15.5)
WBC: 11.3 10*3/uL — ABNORMAL HIGH (ref 4.0–10.5)

## 2013-05-14 LAB — COMPREHENSIVE METABOLIC PANEL
ALT: 13 U/L (ref 0–35)
AST: 20 U/L (ref 0–37)
Albumin: 3.8 g/dL (ref 3.5–5.2)
Alkaline Phosphatase: 78 U/L (ref 39–117)
CO2: 26 mEq/L (ref 19–32)
Calcium: 9.8 mg/dL (ref 8.4–10.5)
Glucose, Bld: 103 mg/dL — ABNORMAL HIGH (ref 70–99)
Potassium: 3.4 mEq/L — ABNORMAL LOW (ref 3.5–5.1)
Sodium: 135 mEq/L (ref 135–145)
Total Protein: 7.7 g/dL (ref 6.0–8.3)

## 2013-05-14 LAB — URINALYSIS, ROUTINE W REFLEX MICROSCOPIC
Bilirubin Urine: NEGATIVE
Glucose, UA: NEGATIVE mg/dL
Protein, ur: NEGATIVE mg/dL
Specific Gravity, Urine: 1.02 (ref 1.005–1.030)
pH: 6 (ref 5.0–8.0)

## 2013-05-14 MED ORDER — ACETAMINOPHEN 325 MG PO TABS: 650.0000 mg | ORAL_TABLET | Freq: Four times a day (QID) | ORAL | Status: DC | PRN

## 2013-05-14 MED ORDER — ACETAMINOPHEN 650 MG RE SUPP: 650.0000 mg | Freq: Four times a day (QID) | RECTAL | Status: DC | PRN

## 2013-05-14 MED ORDER — SODIUM CHLORIDE 0.9 % IV SOLN
Freq: Once | INTRAVENOUS | Status: AC
  Administered 2013-05-14: 13:00:00 via INTRAVENOUS

## 2013-05-14 MED ORDER — MORPHINE SULFATE 4 MG/ML IJ SOLN
4.0000 mg | Freq: Once | INTRAMUSCULAR | Status: AC
  Administered 2013-05-14: 4 mg via INTRAVENOUS
  Filled 2013-05-14: qty 1

## 2013-05-14 MED ORDER — ONDANSETRON HCL 4 MG/2ML IJ SOLN
4.0000 mg | Freq: Four times a day (QID) | INTRAMUSCULAR | Status: DC | PRN
  Administered 2013-05-14 – 2013-05-17 (×5): 4 mg via INTRAVENOUS
  Filled 2013-05-14 (×7): qty 2

## 2013-05-14 MED ORDER — KCL IN DEXTROSE-NACL 20-5-0.45 MEQ/L-%-% IV SOLN
INTRAVENOUS | Status: DC
  Administered 2013-05-14 – 2013-05-19 (×8): via INTRAVENOUS

## 2013-05-14 MED ORDER — IOHEXOL 300 MG/ML  SOLN
50.0000 mL | Freq: Once | INTRAMUSCULAR | Status: AC | PRN
  Administered 2013-05-14: 50 mL via ORAL

## 2013-05-14 MED ORDER — HYDROMORPHONE HCL PF 1 MG/ML IJ SOLN
1.0000 mg | Freq: Once | INTRAMUSCULAR | Status: AC
  Administered 2013-05-14: 1 mg via INTRAVENOUS
  Filled 2013-05-14: qty 1

## 2013-05-14 MED ORDER — NICOTINE 21 MG/24HR TD PT24
21.0000 mg | MEDICATED_PATCH | Freq: Every day | TRANSDERMAL | Status: DC
  Administered 2013-05-14 – 2013-05-24 (×11): 21 mg via TRANSDERMAL
  Filled 2013-05-14 (×12): qty 1

## 2013-05-14 MED ORDER — DIPHENHYDRAMINE HCL 12.5 MG/5ML PO ELIX: 12.5000 mg | ORAL_SOLUTION | Freq: Four times a day (QID) | ORAL | Status: DC | PRN

## 2013-05-14 MED ORDER — MORPHINE SULFATE 4 MG/ML IJ SOLN
4.0000 mg | Freq: Once | INTRAMUSCULAR | Status: AC
Start: 2013-05-14 — End: 2013-05-14
  Administered 2013-05-14: 4 mg via INTRAVENOUS
  Filled 2013-05-14: qty 1

## 2013-05-14 MED ORDER — IOHEXOL 300 MG/ML  SOLN
100.0000 mL | Freq: Once | INTRAMUSCULAR | Status: AC | PRN
  Administered 2013-05-14: 100 mL via INTRAVENOUS

## 2013-05-14 MED ORDER — DEXTROSE 5 % IV SOLN
1.0000 g | Freq: Once | INTRAVENOUS | Status: AC
  Administered 2013-05-14: 1 g via INTRAVENOUS
  Filled 2013-05-14: qty 10

## 2013-05-14 MED ORDER — PANTOPRAZOLE SODIUM 40 MG IV SOLR
40.0000 mg | Freq: Every day | INTRAVENOUS | Status: DC
  Administered 2013-05-14 – 2013-05-20 (×7): 40 mg via INTRAVENOUS
  Filled 2013-05-14 (×7): qty 40

## 2013-05-14 MED ORDER — HYDROMORPHONE HCL PF 1 MG/ML IJ SOLN
1.0000 mg | INTRAMUSCULAR | Status: DC | PRN
  Administered 2013-05-14 – 2013-05-23 (×38): 1 mg via INTRAVENOUS
  Filled 2013-05-14 (×38): qty 1

## 2013-05-14 MED ORDER — ONDANSETRON HCL 4 MG/2ML IJ SOLN
4.0000 mg | Freq: Once | INTRAMUSCULAR | Status: AC
  Administered 2013-05-14: 4 mg via INTRAVENOUS
  Filled 2013-05-14: qty 2

## 2013-05-14 MED ORDER — DIPHENHYDRAMINE HCL 50 MG/ML IJ SOLN
12.5000 mg | Freq: Four times a day (QID) | INTRAMUSCULAR | Status: DC | PRN
  Administered 2013-05-15: 25 mg via INTRAVENOUS
  Filled 2013-05-14: qty 1

## 2013-05-14 MED ORDER — ENOXAPARIN SODIUM 40 MG/0.4ML ~~LOC~~ SOLN
40.0000 mg | SUBCUTANEOUS | Status: DC
  Administered 2013-05-14 – 2013-05-23 (×10): 40 mg via SUBCUTANEOUS
  Filled 2013-05-14 (×10): qty 0.4

## 2013-05-14 NOTE — ED Notes (Signed)
Pt states that the pain medicine has not helped any at all, c/o back pain, Dr. Judd Lien notified,

## 2013-05-14 NOTE — ED Notes (Signed)
Pt requesting something to drink, advised pt that she could not have anything to drink until results have been reviewed by edp. Mouth swabs given to pt for comfort.

## 2013-05-14 NOTE — ED Notes (Signed)
Pt in ct at present time,  

## 2013-05-14 NOTE — ED Provider Notes (Signed)
CSN: [id number]     Arrival date & time 05/14/13  1209 History   First MD Initiated Contact with Patient 05/14/13 1241     Chief Complaint  Patient presents with  . Abdominal Pain   (Consider location/radiation/quality/duration/timing/severity/associated sxs/prior Treatment) HPI Comments: Patient is a 67 year old female with past medical history of Crohn's disease. She is status post small bowel resection and has a large ventral hernia. She presents today with complaints of pain in the left side of her abdomen in the area of the hernia. This started this morning. She denies vomiting but does feel nauseated. She has not had any diarrhea or constipation. She denies any fevers or chills.  Patient is a 67 y.o. female presenting with abdominal pain. The history is provided by the patient.  Abdominal Pain Pain location:  LUQ and LLQ Pain quality: cramping   Pain radiates to:  Does not radiate Pain severity:  Severe Onset quality:  Gradual Duration:  8 hours Timing:  Constant Progression:  Worsening Chronicity:  New Relieved by:  Nothing Worsened by:  Nothing tried   Past Medical History  Diagnosis Date  . Crohn's disease   . Hypertension   . Stroke   . Arthritis   . Sciatica of right side   . Degenerative disc disease, lumbar   . Ventral hernia    Past Surgical History  Procedure Laterality Date  . Cholecystectomy      2008  . Carotid endarterectomy      2004  . Hemicolectomy      right  . Tubal ligation     Family History  Problem Relation Age of Onset  . GI problems Father   . Stroke Brother   . Heart attack Mother   . Hypotension Sister    History  Substance Use Topics  . Smoking status: Current Every Day Smoker -- 1.00 packs/day for 30 years  . Smokeless tobacco: Not on file     Comment: 1-2 pack day x 20 yrs.  . Alcohol Use: No     Comment: one every 2 -3 months.   OB History   Grav Para Term Preterm Abortions TAB SAB Ect Mult Living                  Review of Systems  Gastrointestinal: Positive for abdominal pain.  All other systems reviewed and are negative.    Allergies  Flagyl  Home Medications   Current Outpatient Rx  Name  Route  Sig  Dispense  Refill  . acetaminophen (TYLENOL) 500 MG tablet   Oral   Take 1,000 mg by mouth daily as needed for mild pain or moderate pain.         Marland Kitchen aspirin 81 MG tablet   Oral   Take 81 mg by mouth daily.          . DELZICOL 400 MG CPDR      TAKE 2 CAPSULES BY MOUTH TWICE DAILY.   120 capsule   5   . lisinopril (PRINIVIL,ZESTRIL) 20 MG tablet      Take 1/2 tablet po qd   20 tablet   0   . ondansetron (ZOFRAN) 4 MG tablet   Oral   Take 4 mg by mouth every 8 (eight) hours as needed.          BP 100/77  Pulse 57  Temp(Src) 97.5 F (36.4 C) (Oral)  Resp 20  SpO2 99% Physical Exam  Nursing note and vitals reviewed. Constitutional:  She is oriented to person, place, and time. She appears well-developed and well-nourished. No distress.  HENT:  Head: Normocephalic and atraumatic.  Neck: Normal range of motion. Neck supple.  Cardiovascular: Normal rate and regular rhythm.  Exam reveals no gallop and no friction rub.   No murmur heard. Pulmonary/Chest: Effort normal and breath sounds normal. No respiratory distress. She has no wheezes.  Abdominal: Soft. Bowel sounds are normal. She exhibits no distension. There is tenderness.  There is a large ventral hernia noted which extends to the left side of the abdomen. She is tender to palpation in this area. There is no rebound or no guarding.  Musculoskeletal: Normal range of motion.  Neurological: She is alert and oriented to person, place, and time.  Skin: Skin is warm and dry. She is not diaphoretic.    ED Course  Procedures (including critical care time) Labs Review Labs Reviewed  CBC WITH DIFFERENTIAL - Abnormal; Notable for the following:    WBC 11.3 (*)    Neutro Abs 8.7 (*)    All other components within  normal limits  COMPREHENSIVE METABOLIC PANEL - Abnormal; Notable for the following:    Potassium 3.4 (*)    Glucose, Bld 103 (*)    Total Bilirubin 0.2 (*)    GFR calc non Af Amer 74 (*)    GFR calc Af Amer 86 (*)    All other components within normal limits  URINALYSIS, ROUTINE W REFLEX MICROSCOPIC - Abnormal; Notable for the following:    APPearance CLOUDY (*)    Hgb urine dipstick TRACE (*)    Nitrite POSITIVE (*)    Leukocytes, UA MODERATE (*)    All other components within normal limits  URINE MICROSCOPIC-ADD ON - Abnormal; Notable for the following:    Squamous Epithelial / LPF FEW (*)    Bacteria, UA MANY (*)    All other components within normal limits  URINE CULTURE  LIPASE, BLOOD   Imaging Review No results found.    MDM  No diagnosis found. Patient presents with abdominal pain and has a large ventral hernia due to surgeries for Crohn's disease. Workup reveals elevated white count of 11.3 but normal liver functions and lipase. Urinalysis does reveal urinary tract infection but I do not believe this is the cause of her discomfort. A CT scan of the abdomen and pelvis was performed which reveals evidence for thickening and distention of the bowel loop within the ventral hernia concerning for developing bowel obstruction. I've spoken with Dr. Arnoldo Morale about this and he agrees to see the patient in the emergency department. She will be given IV antibiotics for her urinary tract infection and the final disposition will be left up to Dr. Arnoldo Morale.  She's not feeling much better with the pain medication given and another dose will be given.      Veryl Speak, MD 05/14/13 [phone]

## 2013-05-14 NOTE — ED Notes (Addendum)
Pt c/o nausea, zofran given per prn order, pt advised to stop eating any more ice chips,

## 2013-05-14 NOTE — ED Notes (Signed)
Pt requesting something to drink, ice chips given per order,

## 2013-05-14 NOTE — H&P (Signed)
[name] is an 68 y.o. female.   Chief Complaint: Abdominal pain, partial bowel obstruction HPI: Patient is an 67 year old white female history Crohn's disease and a chronic large incisional hernia who presents with abdominal pain and nausea that started earlier today. She states she had 2 bowel movements this morning. A CT scan the abdomen and pelvis reveals a large incisional hernia with both colon and small intestines present. There is one area of small bowel which appears to be thickened with some mesenteric stranding. There appears to be contrast both proximally and distally to this point. She reveals that she has been seen by our practice in the past for similar problems, treated with NG tube and no surgery. She has been told that she needs to have the hernia fixed at a tertiary care center.  Past Medical History  Diagnosis Date  . Crohn's disease   . Hypertension   . Stroke   . Arthritis   . Sciatica of right side   . Degenerative disc disease, lumbar   . Ventral hernia     Past Surgical History  Procedure Laterality Date  . Cholecystectomy      2008  . Carotid endarterectomy      2004  . Hemicolectomy      right  . Tubal ligation      Family History  Problem Relation Age of Onset  . GI problems Father   . Stroke Brother   . Heart attack Mother   . Hypotension Sister    Social History:  reports that she has been smoking.  She does not have any smokeless tobacco history on file. She reports that she does not drink alcohol or use illicit drugs.  Allergies:  Allergies  Allergen Reactions  . Flagyl [Metronidazole Hcl] Other (See Comments)    "makes me feel whoosy and weird"     (Not in a hospital admission)  Results for orders placed during the hospital encounter of 05/14/13 (from the past 48 hour(s))  CBC WITH DIFFERENTIAL     Status: Abnormal   Collection Time    05/14/13  1:21 PM      Result Value Range   WBC 11.3 (*) 4.0 - 10.5 K/uL   RBC 4.45  3.87 -  5.11 MIL/uL   Hemoglobin 14.5  12.0 - 15.0 g/dL   HCT 42.5  36.0 - 46.0 %   MCV 95.5  78.0 - 100.0 fL   MCH 32.6  26.0 - 34.0 pg   MCHC 34.1  30.0 - 36.0 g/dL   RDW 12.5  11.5 - 15.5 %   Platelets 213  150 - 400 K/uL   Neutrophils Relative % 77  43 - 77 %   Neutro Abs 8.7 (*) 1.7 - 7.7 K/uL   Lymphocytes Relative 17  12 - 46 %   Lymphs Abs 2.0  0.7 - 4.0 K/uL   Monocytes Relative 4  3 - 12 %   Monocytes Absolute 0.5  0.1 - 1.0 K/uL   Eosinophils Relative 1  0 - 5 %   Eosinophils Absolute 0.1  0.0 - 0.7 K/uL   Basophils Relative 0  0 - 1 %   Basophils Absolute 0.0  0.0 - 0.1 K/uL  COMPREHENSIVE METABOLIC PANEL     Status: Abnormal   Collection Time    05/14/13  1:21 PM      Result Value Range   Sodium 135  135 - 145 mEq/L   Potassium 3.4 (*) 3.5 -  5.1 mEq/L   Chloride 98  96 - 112 mEq/L   CO2 26  19 - 32 mEq/L   Glucose, Bld 103 (*) 70 - 99 mg/dL   BUN 9  6 - 23 mg/dL   Creatinine, Ser 0.80  0.50 - 1.10 mg/dL   Calcium 9.8  8.4 - 10.5 mg/dL   Total Protein 7.7  6.0 - 8.3 g/dL   Albumin 3.8  3.5 - 5.2 g/dL   AST 20  0 - 37 U/L   ALT 13  0 - 35 U/L   Alkaline Phosphatase 78  39 - 117 U/L   Total Bilirubin 0.2 (*) 0.3 - 1.2 mg/dL   GFR calc non Af Amer 74 (*) >90 mL/min   GFR calc Af Amer 86 (*) >90 mL/min   Comment: (NOTE)     The eGFR has been calculated using the CKD EPI equation.     This calculation has not been validated in all clinical situations.     eGFR's persistently <90 mL/min signify possible Chronic Kidney     Disease.  LIPASE, BLOOD     Status: None   Collection Time    05/14/13  1:21 PM      Result Value Range   Lipase 56  11 - 59 U/L  URINALYSIS, ROUTINE W REFLEX MICROSCOPIC     Status: Abnormal   Collection Time    05/14/13  1:29 PM      Result Value Range   Color, Urine YELLOW  YELLOW   APPearance CLOUDY (*) CLEAR   Specific Gravity, Urine 1.020  1.005 - 1.030   pH 6.0  5.0 - 8.0   Glucose, UA NEGATIVE  NEGATIVE mg/dL   Hgb urine dipstick TRACE  (*) NEGATIVE   Bilirubin Urine NEGATIVE  NEGATIVE   Ketones, ur NEGATIVE  NEGATIVE mg/dL   Protein, ur NEGATIVE  NEGATIVE mg/dL   Urobilinogen, UA 0.2  0.0 - 1.0 mg/dL   Nitrite POSITIVE (*) NEGATIVE   Leukocytes, UA MODERATE (*) NEGATIVE  URINE MICROSCOPIC-ADD ON     Status: Abnormal   Collection Time    05/14/13  1:29 PM      Result Value Range   Squamous Epithelial / LPF FEW (*) RARE   WBC, UA 11-20  <3 WBC/hpf   RBC / HPF 3-6  <3 RBC/hpf   Bacteria, UA MANY (*) RARE   Urine-Other TRICHOMONAS PRESENT     Ct Abdomen Pelvis W Contrast  05/14/2013   CLINICAL DATA:  Pain.  Crohn's disease.  Prior colon surgery.  EXAM: CT ABDOMEN AND PELVIS WITH CONTRAST  TECHNIQUE: Multidetector CT imaging of the abdomen and pelvis was performed using the standard protocol following bolus administration of intravenous contrast.  CONTRAST:  158m OMNIPAQUE IOHEXOL 300 MG/ML SOLN, 531mOMNIPAQUE IOHEXOL 300 MG/ML SOLN  COMPARISON:  12/15/2012.  FINDINGS: 5 mm lucency left lobe of lobe of the liver, most likely tiny cyst,too small for accurate characterization. No other hepatic abnormalities. Spleen normal. Calcifications in pancreas consistent with chronic pancreatitis. Cholecystectomy. Biliary dilatation present consistent with prior cholecystectomy is unchanged. No significant intrahepatic biliary dilatation is present.  Adrenals are stable. Mild prominence of the left adrenal gland is unchanged and suggests mild adrenal hyperplasia. Tiny renal cysts again noted. No hydronephrosis or evidence of obstructing ureteral stone. The bladder is unremarkable. Uterus and adnexa are unremarkable. No free pelvic fluid.  No significant adenopathy. Aortoiliac atherosclerotic vascular disease present. No aneurysm.  No inflammatory changes are noted in the  right or left lower quadrant. Surgical clips right lower quadrant. This is consistent with prior bowel surgery. Patient has a known history of Crohn's disease. Large ventral  hernia is present. Similar finding was noted on prior exam. However on today's exam there is several loops of dilated small bowel with adjacent fat mesenteric fat inflammatory change. This suggest the possibility of developing strangulation and obstruction. Small bowel loops proximal to the hernia arm are moderately dilated. It should be noted that colon and small bowel are herniated into the large ventral hernia. The colon is nondistended. No free air.  Lung bases are clear. Heart size normal. Degenerative changes lumbar spine and both hips.  IMPRESSION: Large ventral abdominal hernia with herniated small and large bowel is again noted. However noted on today's examination are distended loops of small bowel with inflammatory change in the herniated mesenteric fat. These findings suggest possibility of developing strangulation and small bowel obstruction. These results were called by telephone at the time of interpretation on 05/14/2013 at 4:12 PM to Dr. Veryl Speak , who verbally acknowledged these results.   Electronically Signed   By: Marcello Moores  Register   On: 05/14/2013 16:13    Review of Systems  Constitutional: Positive for malaise/fatigue.  Eyes: Negative.   Respiratory: Negative.   Cardiovascular: Negative.   Gastrointestinal: Positive for heartburn, nausea and abdominal pain.  Skin: Negative.   All other systems reviewed and are negative.    Blood pressure 127/69, pulse 84, temperature 97.5 F (36.4 C), temperature source Oral, resp. rate 20, SpO2 96.00%. Physical Exam  Vitals reviewed. Constitutional: She is oriented to person, place, and time. She appears well-developed and well-nourished.  HENT:  Head: Normocephalic and atraumatic.  Neck: Normal range of motion. Neck supple.  Cardiovascular: Normal rate, regular rhythm and normal heart sounds.   Respiratory: Effort normal and breath sounds normal.  GI: Soft. She exhibits distension. There is tenderness. There is no rebound.  Large  incisional hernia which is not reducible as it is chronic in nature and large. No rigidity noted.  Neurological: She is alert and oriented to person, place, and time.  Skin: Skin is warm and dry.     Assessment/Plan Impression: Crohn's disease, chronic incisional hernia, recurrent partial small bowel obstruction Plan: Patient admitted to the hospital for IV hydration, control pain and nausea, and nasogastric tube decompression. No need for acute surgical intervention at this time. This has been explained to the patient, who agrees to the treatment plan.  Evangeline Utley A 05/14/2013, 5:43 PM

## 2013-05-14 NOTE — ED Notes (Signed)
Dr. Delo at bedside. 

## 2013-05-14 NOTE — ED Notes (Signed)
Pt c/o generalized abd pain that radiates around to back area that started this am, denies any vomiting or diarrhea.

## 2013-05-14 NOTE — ED Notes (Signed)
Dr Jenkin's at bedside,  

## 2013-05-14 NOTE — ED Notes (Signed)
Pt c/o upper abd pain radiating around to back with nausea since this morning.  Denies vomiting or diarrhea.  Reports has had 2 normal BMs today.

## 2013-05-14 NOTE — ED Notes (Signed)
Pt returned from xray,  

## 2013-05-15 LAB — CBC
HCT: 37.7 % (ref 36.0–46.0)
Hemoglobin: 12.9 g/dL (ref 12.0–15.0)
MCH: 32.3 pg (ref 26.0–34.0)
MCV: 94.5 fL (ref 78.0–100.0)
Platelets: 221 10*3/uL (ref 150–400)
RDW: 12.3 % (ref 11.5–15.5)
WBC: 14.9 10*3/uL — ABNORMAL HIGH (ref 4.0–10.5)

## 2013-05-15 LAB — BASIC METABOLIC PANEL
BUN: 7 mg/dL (ref 6–23)
Calcium: 8.9 mg/dL (ref 8.4–10.5)
Creatinine, Ser: 0.71 mg/dL (ref 0.50–1.10)
GFR calc non Af Amer: 87 mL/min — ABNORMAL LOW (ref >90)
Glucose, Bld: 161 mg/dL — ABNORMAL HIGH (ref 70–99)
Potassium: 3.9 mEq/L (ref 3.5–5.1)

## 2013-05-15 MED ORDER — MENTHOL 3 MG MT LOZG
1.0000 | LOZENGE | OROMUCOSAL | Status: DC | PRN
  Filled 2013-05-15: qty 9

## 2013-05-15 NOTE — Progress Notes (Signed)
Subjective: States her abdominal pain is less, well controlled with pain medication. Denies flatus or bowel movement.  Objective: Vital signs in last 24 hours: Temp:  [97.3 F (36.3 C)-98 F (36.7 C)] 98 F (36.7 C) (11/30 1040) Pulse Rate:  [57-88] 76 (11/30 1040) Resp:  [18-20] 20 (11/30 1040) BP: (100-157)/(57-97) 125/70 mmHg (11/30 1040) SpO2:  [95 %-100 %] 98 % (11/30 1040) Weight:  [64.7 kg (142 lb 10.2 oz)] 64.7 kg (142 lb 10.2 oz) (11/29 2046) Last BM Date: 05/14/13  Intake/Output from previous day:   Intake/Output this shift: Total I/O In: 0  Out: 150 [Urine:150]  General appearance: alert, cooperative and no distress GI: Soft. Nonspecific tenderness noted within the hernia sac. No rigidity present. Occasional bowel sounds appreciated.  Lab Results:   Recent Labs  05/14/13 1321 05/15/13 0551  WBC 11.3* 14.9*  HGB 14.5 12.9  HCT 42.5 37.7  PLT 213 221   BMET  Recent Labs  05/14/13 1321 05/15/13 0551  NA 135 131*  K 3.4* 3.9  CL 98 96  CO2 26 25  GLUCOSE 103* 161*  BUN 9 7  CREATININE 0.80 0.71  CALCIUM 9.8 8.9   PT/INR No results found for this basename: LABPROT, INR,  in the last 72 hours  Studies/Results: Ct Abdomen Pelvis W Contrast  05/14/2013   CLINICAL DATA:  Pain.  Crohn's disease.  Prior colon surgery.  EXAM: CT ABDOMEN AND PELVIS WITH CONTRAST  TECHNIQUE: Multidetector CT imaging of the abdomen and pelvis was performed using the standard protocol following bolus administration of intravenous contrast.  CONTRAST:  123m OMNIPAQUE IOHEXOL 300 MG/ML SOLN, 528mOMNIPAQUE IOHEXOL 300 MG/ML SOLN  COMPARISON:  12/15/2012.  FINDINGS: 5 mm lucency left lobe of lobe of the liver, most likely tiny cyst,too small for accurate characterization. No other hepatic abnormalities. Spleen normal. Calcifications in pancreas consistent with chronic pancreatitis. Cholecystectomy. Biliary dilatation present consistent with prior cholecystectomy is unchanged. No  significant intrahepatic biliary dilatation is present.  Adrenals are stable. Mild prominence of the left adrenal gland is unchanged and suggests mild adrenal hyperplasia. Tiny renal cysts again noted. No hydronephrosis or evidence of obstructing ureteral stone. The bladder is unremarkable. Uterus and adnexa are unremarkable. No free pelvic fluid.  No significant adenopathy. Aortoiliac atherosclerotic vascular disease present. No aneurysm.  No inflammatory changes are noted in the right or left lower quadrant. Surgical clips right lower quadrant. This is consistent with prior bowel surgery. Patient has a known history of Crohn's disease. Large ventral hernia is present. Similar finding was noted on prior exam. However on today's exam there is several loops of dilated small bowel with adjacent fat mesenteric fat inflammatory change. This suggest the possibility of developing strangulation and obstruction. Small bowel loops proximal to the hernia arm are moderately dilated. It should be noted that colon and small bowel are herniated into the large ventral hernia. The colon is nondistended. No free air.  Lung bases are clear. Heart size normal. Degenerative changes lumbar spine and both hips.  IMPRESSION: Large ventral abdominal hernia with herniated small and large bowel is again noted. However noted on today's examination are distended loops of small bowel with inflammatory change in the herniated mesenteric fat. These findings suggest possibility of developing strangulation and small bowel obstruction. These results were called by telephone at the time of interpretation on 05/14/2013 at 4:12 PM to Dr. DOVeryl Speak who verbally acknowledged these results.   Electronically Signed   By: ThMarcello MooresRegister  On: 05/14/2013 16:13    Anti-infectives: Anti-infectives   Start     Dose/Rate Route Frequency Ordered Stop   05/14/13 1630  cefTRIAXone (ROCEPHIN) 1 g in dextrose 5 % 50 mL IVPB     1 g 100 mL/hr over 30  Minutes Intravenous  Once 05/14/13 1616 05/14/13 1733      Assessment/Plan: Impression: Partial bowel obstruction secondary to adhesive disease, Crohn's disease, chronic incisional hernia. White blood cell count has increased, though patient's symptomatology has improved. Will continue NG tube decompression at this point. We'll continue to follow expectantly.  LOS: 1 day    Kamilah Correia A 05/15/2013

## 2013-05-16 LAB — BASIC METABOLIC PANEL
CO2: 29 mEq/L (ref 19–32)
Calcium: 9.2 mg/dL (ref 8.4–10.5)
Chloride: 99 mEq/L (ref 96–112)
Creatinine, Ser: 0.86 mg/dL (ref 0.50–1.10)
GFR calc Af Amer: 79 mL/min — ABNORMAL LOW (ref >90)
Glucose, Bld: 106 mg/dL — ABNORMAL HIGH (ref 70–99)
Potassium: 4.1 mEq/L (ref 3.5–5.1)

## 2013-05-16 LAB — URINE CULTURE: Colony Count: 85000

## 2013-05-16 LAB — CBC
HCT: 39.8 % (ref 36.0–46.0)
Hemoglobin: 13.2 g/dL (ref 12.0–15.0)
MCV: 96.1 fL (ref 78.0–100.0)
Platelets: 206 10*3/uL (ref 150–400)
RDW: 12.7 % (ref 11.5–15.5)

## 2013-05-16 MED ORDER — BISACODYL 10 MG RE SUPP
10.0000 mg | Freq: Once | RECTAL | Status: AC
  Administered 2013-05-16: 10 mg via RECTAL
  Filled 2013-05-16: qty 1

## 2013-05-16 NOTE — Progress Notes (Signed)
Subjective: No significant change since yesterday. Patient has had some NG tube output. She denies any flatus or bowel movement, though she does feel movement in her abdomen. Minimal abdominal pain is noted.  Objective: Vital signs in last 24 hours: Temp:  [97.7 F (36.5 C)-98.4 F (36.9 C)] 98.4 F (36.9 C) (12/01 0534) Pulse Rate:  [70-88] 88 (12/01 0534) Resp:  [20] 20 (12/01 0534) BP: (124-144)/(61-84) 136/84 mmHg (12/01 0534) SpO2:  [92 %-100 %] 92 % (12/01 0534) Last BM Date: 05/14/13  Intake/Output from previous day: 11/30 0701 - 12/01 0700 In: 0  Out: 750 [Urine:750] Intake/Output this shift:    General appearance: alert, cooperative and no distress Resp: clear to auscultation bilaterally Cardio: regular rate and rhythm, S1, S2 normal, no murmur, click, rub or gallop GI: Soft with large incisional hernia present. Bowel sounds are active. No significant tenderness is noted. No rigidity is noted.  Lab Results:   Recent Labs  05/15/13 0551 05/16/13 0508  WBC 14.9* 11.1*  HGB 12.9 13.2  HCT 37.7 39.8  PLT 221 206   BMET  Recent Labs  05/15/13 0551 05/16/13 0508  NA 131* 135  K 3.9 4.1  CL 96 99  CO2 25 29  GLUCOSE 161* 106*  BUN 7 5*  CREATININE 0.71 0.86  CALCIUM 8.9 9.2   PT/INR No results found for this basename: LABPROT, INR,  in the last 72 hours  Studies/Results: Ct Abdomen Pelvis W Contrast  05/14/2013   CLINICAL DATA:  Pain.  Crohn's disease.  Prior colon surgery.  EXAM: CT ABDOMEN AND PELVIS WITH CONTRAST  TECHNIQUE: Multidetector CT imaging of the abdomen and pelvis was performed using the standard protocol following bolus administration of intravenous contrast.  CONTRAST:  132m OMNIPAQUE IOHEXOL 300 MG/ML SOLN, 556mOMNIPAQUE IOHEXOL 300 MG/ML SOLN  COMPARISON:  12/15/2012.  FINDINGS: 5 mm lucency left lobe of lobe of the liver, most likely tiny cyst,too small for accurate characterization. No other hepatic abnormalities. Spleen normal.  Calcifications in pancreas consistent with chronic pancreatitis. Cholecystectomy. Biliary dilatation present consistent with prior cholecystectomy is unchanged. No significant intrahepatic biliary dilatation is present.  Adrenals are stable. Mild prominence of the left adrenal gland is unchanged and suggests mild adrenal hyperplasia. Tiny renal cysts again noted. No hydronephrosis or evidence of obstructing ureteral stone. The bladder is unremarkable. Uterus and adnexa are unremarkable. No free pelvic fluid.  No significant adenopathy. Aortoiliac atherosclerotic vascular disease present. No aneurysm.  No inflammatory changes are noted in the right or left lower quadrant. Surgical clips right lower quadrant. This is consistent with prior bowel surgery. Patient has a known history of Crohn's disease. Large ventral hernia is present. Similar finding was noted on prior exam. However on today's exam there is several loops of dilated small bowel with adjacent fat mesenteric fat inflammatory change. This suggest the possibility of developing strangulation and obstruction. Small bowel loops proximal to the hernia arm are moderately dilated. It should be noted that colon and small bowel are herniated into the large ventral hernia. The colon is nondistended. No free air.  Lung bases are clear. Heart size normal. Degenerative changes lumbar spine and both hips.  IMPRESSION: Large ventral abdominal hernia with herniated small and large bowel is again noted. However noted on today's examination are distended loops of small bowel with inflammatory change in the herniated mesenteric fat. These findings suggest possibility of developing strangulation and small bowel obstruction. These results were called by telephone at the time of interpretation on  05/14/2013 at 4:12 PM to Dr. Veryl Speak , who verbally acknowledged these results.   Electronically Signed   By: Marcello Moores  Register   On: 05/14/2013 16:13     Anti-infectives: Anti-infectives   Start     Dose/Rate Route Frequency Ordered Stop   05/14/13 1630  cefTRIAXone (ROCEPHIN) 1 g in dextrose 5 % 50 mL IVPB     1 g 100 mL/hr over 30 Minutes Intravenous  Once 05/14/13 1616 05/14/13 1733      Assessment/Plan: Impression: Small bowel structure in secondary to adhesive disease, large incisional hernia. Plan: We'll clamp NG tube today. Leukocytosis has resolved. Will continue to follow expectantly.  LOS: 2 days    Rj Pedrosa A 05/16/2013

## 2013-05-16 NOTE — Progress Notes (Signed)
Per Dr. Lovell Sheehan i  Will hook patient back up to suction for about 10 min or so and record what is removed and then re clamp patient.

## 2013-05-16 NOTE — Care Management Note (Addendum)
    Page 1 of 1   05/24/2013     10:32:24 AM   CARE MANAGEMENT NOTE 05/24/2013  Patient:  [name], [name]   Account Number:  [id number]  Date Initiated:  05/16/2013  Documentation initiated by:  Sharrie Rothman  Subjective/Objective Assessment:   Pt admitted from home with SBO. Pt lives with family and is independent with ADL's. Pt stated that she would return home at discharge.     Action/Plan:   No CM needs noted.   Anticipated DC Date:  05/23/2013   Anticipated DC Plan:  HOME/SELF CARE      DC Planning Services  CM consult      Choice offered to / List presented to:             Status of service:  Completed, signed off Medicare Important Message given?  YES (If response is "NO", the following Medicare IM given date fields will be blank) Date Medicare IM given:  05/24/2013 Date Additional Medicare IM given:    Discharge Disposition:  HOME/SELF CARE  Per UR Regulation:    If discussed at Long Length of Stay Meetings, dates discussed:   05/19/2013  05/24/2013    Comments:  05/24/13 1030 Ruhi Kopke, RN BSN CM Pt discharged home today. No CM needs noted.  05/16/13 1335 Arlyss Queen, RN BSN CM

## 2013-05-16 NOTE — Progress Notes (Signed)
UR chart review completed.  

## 2013-05-17 MED ORDER — BISACODYL 10 MG RE SUPP
10.0000 mg | Freq: Once | RECTAL | Status: AC
  Administered 2013-05-17: 10 mg via RECTAL
  Filled 2013-05-17: qty 1

## 2013-05-17 MED ORDER — ONDANSETRON HCL 4 MG/2ML IJ SOLN
4.0000 mg | INTRAMUSCULAR | Status: DC | PRN
  Administered 2013-05-17 – 2013-05-24 (×10): 4 mg via INTRAVENOUS
  Filled 2013-05-17 (×10): qty 2

## 2013-05-17 MED ORDER — BISACODYL 10 MG RE SUPP
10.0000 mg | Freq: Two times a day (BID) | RECTAL | Status: DC
  Administered 2013-05-18 – 2013-05-19 (×3): 10 mg via RECTAL
  Filled 2013-05-17 (×3): qty 1

## 2013-05-17 MED ORDER — MAGNESIUM HYDROXIDE 400 MG/5ML PO SUSP
30.0000 mL | Freq: Two times a day (BID) | ORAL | Status: DC
  Administered 2013-05-17 – 2013-05-19 (×5): 30 mL via ORAL
  Filled 2013-05-17 (×6): qty 30

## 2013-05-17 NOTE — Progress Notes (Signed)
Pt ambulated in hallway. Pt able to get to nurses desk and back to room. Pt did not have any flatus while walking. Will continue to monitor.

## 2013-05-17 NOTE — Progress Notes (Signed)
  Subjective: Had 2 bowel movements yesterday. Abdominal pain seems to be used. NG output decreased.  Objective: Vital signs in last 24 hours: Temp:  [98.2 F (36.8 C)-98.6 F (37 C)] 98.4 F (36.9 C) (12/02 0457) Pulse Rate:  [76-92] 92 (12/02 0457) Resp:  [20] 20 (12/02 0457) BP: (116-149)/(76-89) 116/78 mmHg (12/02 0457) SpO2:  [95 %-99 %] 97 % (12/02 0457) Last BM Date: 05/16/13  Intake/Output from previous day: 12/01 0701 - 12/02 0700 In: 0  Out: 1050 [Urine:800; Emesis/NG output:250] Intake/Output this shift:    General appearance: alert, cooperative and no distress Resp: clear to auscultation bilaterally Cardio: regular rate and rhythm, S1, S2 normal, no murmur, click, rub or gallop GI: Soft, no rigidity noted. Bowel sounds active.  Lab Results:   Recent Labs  05/15/13 0551 05/16/13 0508  WBC 14.9* 11.1*  HGB 12.9 13.2  HCT 37.7 39.8  PLT 221 206   BMET  Recent Labs  05/15/13 0551 05/16/13 0508  NA 131* 135  K 3.9 4.1  CL 96 99  CO2 25 29  GLUCOSE 161* 106*  BUN 7 5*  CREATININE 0.71 0.86  CALCIUM 8.9 9.2   PT/INR No results found for this basename: LABPROT, INR,  in the last 72 hours  Studies/Results: No results found.  Anti-infectives: Anti-infectives   Start     Dose/Rate Route Frequency Ordered Stop   05/14/13 1630  cefTRIAXone (ROCEPHIN) 1 g in dextrose 5 % 50 mL IVPB     1 g 100 mL/hr over 30 Minutes Intravenous  Once 05/14/13 1616 05/14/13 1733      Assessment/Plan: Impression: Small bowel obstruction with large incisional hernia, resolving. Plan: We will remove gastric tube. We'll advance to full liquid diet. Have encouraged patient to ambulate.  LOS: 3 days    [name] A 05/17/2013

## 2013-05-17 NOTE — Progress Notes (Signed)
Pt had one episode of emesis. I did not get to see it, but pt reports feeling nauseated. MD notified. Will continue to monitor.

## 2013-05-17 NOTE — Progress Notes (Signed)
Pt had full liquid tray for lunch. She ate about 30% of her tray and felt some nausea but no emesis at this time. Will attempt to ambulate again shortly. Will continue to monitor.

## 2013-05-18 LAB — BASIC METABOLIC PANEL
CO2: 31 mEq/L (ref 19–32)
Chloride: 96 mEq/L (ref 96–112)
GFR calc non Af Amer: 84 mL/min — ABNORMAL LOW (ref >90)
Glucose, Bld: 118 mg/dL — ABNORMAL HIGH (ref 70–99)
Sodium: 134 mEq/L — ABNORMAL LOW (ref 135–145)

## 2013-05-18 LAB — CBC
HCT: 37.3 % (ref 36.0–46.0)
Hemoglobin: 12.6 g/dL (ref 12.0–15.0)
MCV: 95.9 fL (ref 78.0–100.0)
Platelets: 203 10*3/uL (ref 150–400)
RBC: 3.89 MIL/uL (ref 3.87–5.11)
WBC: 10.5 10*3/uL (ref 4.0–10.5)

## 2013-05-18 NOTE — Progress Notes (Signed)
Pt had another episode of dark green emesis. Pt is refusing nausea medication, she is afraid it is making her feel worse. Will continue to monitor.

## 2013-05-18 NOTE — Progress Notes (Addendum)
Pt has continuously refused to walk in hall. She reports that she 'feels too sick' but does not want any nausea or pain medicine. Pt reports that she has not passed any gas today, but she states that she did have a bowel movement that was 'normal' for her. I did not see that bowel movement and the nurse tech that worked with her earlier today has gone home so I am not sure what it looked like. Pt's PO intake has also been poor today. Will continue to monitor and encourage ambulation.   1715: Pt agreed to try nausea medicine again. Will ask pt to ambulate in a few minutes.   1730: Checked on pt again to see if she would ambulate. She had an episode of dark green emesis and refused to ambulate. Will continue to monitor.

## 2013-05-18 NOTE — Progress Notes (Signed)
  Subjective: States her abdomen feels softer with less pain. She has had multiple bowel movements, but also has some nausea.  Objective: Vital signs in last 24 hours: Temp:  [98.3 F (36.8 C)-99 F (37.2 C)] 98.5 F (36.9 C) (12/03 0422) Pulse Rate:  [80-85] 80 (12/03 0422) Resp:  [16-20] 16 (12/03 0422) BP: (128-135)/(75-84) 128/76 mmHg (12/03 0422) SpO2:  [96 %-98 %] 98 % (12/03 0422) Last BM Date: 05/16/13  Intake/Output from previous day: 12/02 0701 - 12/03 0700 In: 240 [P.O.:240] Out: 1250 [Urine:950; Emesis/NG output:300] Intake/Output this shift: Total I/O In: -  Out: 200 [Urine:200]  General appearance: alert, cooperative and no distress Resp: clear to auscultation bilaterally Cardio: regular rate and rhythm, S1, S2 normal, no murmur, click, rub or gallop GI: Soft. Active bowel sounds appreciated. No tenderness noted over the hernia site. No rigidity noted.  Lab Results:   Recent Labs  05/16/13 0508 05/18/13 0458  WBC 11.1* 10.5  HGB 13.2 12.6  HCT 39.8 37.3  PLT 206 203   BMET  Recent Labs  05/16/13 0508 05/18/13 0458  NA 135 134*  K 4.1 4.4  CL 99 96  CO2 29 31  GLUCOSE 106* 118*  BUN 5* 5*  CREATININE 0.86 0.79  CALCIUM 9.2 9.3   PT/INR No results found for this basename: LABPROT, INR,  in the last 72 hours  Studies/Results: No results found.  Anti-infectives: Anti-infectives   Start     Dose/Rate Route Frequency Ordered Stop   05/14/13 1630  cefTRIAXone (ROCEPHIN) 1 g in dextrose 5 % 50 mL IVPB     1 g 100 mL/hr over 30 Minutes Intravenous  Once 05/14/13 1616 05/14/13 1733      Assessment/Plan: Impression: Partial bowel obstruction with chronic large incisional hernia. On clinical examination, her abdomen is much improved and does not show evidence of obstruction. Patient does have anxiety, and this may be playing a role in her not improving as quickly. We'll continue clear liquid diet. Have encouraged patient to get up out of  bed.   LOS: 4 days    Mailee Klaas A 05/18/2013

## 2013-05-18 NOTE — Progress Notes (Signed)
Pt refused to walk this AM. Will continue to encourage and attempt again later today.

## 2013-05-18 NOTE — Progress Notes (Signed)
Night nurse reported that pt had two episodes of green emesis throughout the night. Pt also had two episode this AM. MD has been notified. Will continue to monitor.

## 2013-05-19 LAB — SURGICAL PCR SCREEN
MRSA, PCR: NEGATIVE
Staphylococcus aureus: NEGATIVE

## 2013-05-19 MED ORDER — FLEET ENEMA 7-19 GM/118ML RE ENEM
1.0000 | ENEMA | Freq: Once | RECTAL | Status: AC
  Administered 2013-05-19: 1 via RECTAL

## 2013-05-19 MED ORDER — DEXTROSE 5 % IV SOLN: 2.0000 g | Freq: Two times a day (BID) | INTRAVENOUS | Status: DC

## 2013-05-19 MED ORDER — CHLORHEXIDINE GLUCONATE 4 % EX LIQD
1.0000 "application " | Freq: Once | CUTANEOUS | Status: AC
  Administered 2013-05-20: 1 via TOPICAL
  Filled 2013-05-19: qty 15

## 2013-05-19 NOTE — Progress Notes (Signed)
  Subjective: Continues to have nausea with some emesis. Denies any abdominal pain.  Objective: Vital signs in last 24 hours: Temp:  [98.2 F (36.8 C)-98.9 F (37.2 C)] 98.3 F (36.8 C) (12/04 0611) Pulse Rate:  [82-108] 108 (12/04 0611) Resp:  [17-20] 17 (12/04 0611) BP: (125-141)/(73-81) 141/73 mmHg (12/04 0611) SpO2:  [96 %-98 %] 98 % (12/04 0611) Last BM Date: 05/18/13  Intake/Output from previous day: 12/03 0701 - 12/04 0700 In: 240 [P.O.:240] Out: 1150 [Urine:1150] Intake/Output this shift:    General appearance: alert and cooperative Resp: clear to auscultation bilaterally Cardio: regular rate and rhythm, S1, S2 normal, no murmur, click, rub or gallop GI: Soft. No rigidity noted. Large incisional hernia soft.  Lab Results:   Recent Labs  05/18/13 0458  WBC 10.5  HGB 12.6  HCT 37.3  PLT 203   BMET  Recent Labs  05/18/13 0458  NA 134*  K 4.4  CL 96  CO2 31  GLUCOSE 118*  BUN 5*  CREATININE 0.79  CALCIUM 9.3   PT/INR No results found for this basename: LABPROT, INR,  in the last 72 hours  Studies/Results: No results found.  Anti-infectives: Anti-infectives   Start     Dose/Rate Route Frequency Ordered Stop   05/14/13 1630  cefTRIAXone (ROCEPHIN) 1 g in dextrose 5 % 50 mL IVPB     1 g 100 mL/hr over 30 Minutes Intravenous  Once 05/14/13 1616 05/14/13 1733      Assessment/Plan: s/p Procedure(s): HERNIA REPAIR INCISIONAL Possible bowel resection Impression: Bowel obstruction with large incisional hernia, non-resolving. Plan: We'll take the patient to the operating room tomorrow for an incisional herniorrhaphy with mesh, possible bowel resection. The risks and benefits of the procedure including bleeding, infection, cardiopulmonary difficulties, and the possibility of a bowel resection were fully explained to the patient, who gave informed consent.  LOS: 5 days    Sky Borboa A 05/19/2013

## 2013-05-19 NOTE — Progress Notes (Signed)
Patient vomited dark green bile looking fluids.

## 2013-05-19 NOTE — Progress Notes (Signed)
Patient has vomited twice tonight and refused pain med and zofran, says she believes it makes her sick and she feels better with out it. Emesis still large amount dark green in color.

## 2013-05-20 ENCOUNTER — Inpatient Hospital Stay (HOSPITAL_COMMUNITY): Payer: Medicare Other | Admitting: Anesthesiology

## 2013-05-20 ENCOUNTER — Encounter (HOSPITAL_COMMUNITY): Payer: Self-pay | Admitting: Anesthesiology

## 2013-05-20 ENCOUNTER — Encounter (HOSPITAL_COMMUNITY): Payer: Medicare Other | Admitting: Anesthesiology

## 2013-05-20 ENCOUNTER — Encounter (HOSPITAL_COMMUNITY)
Admission: EM | Disposition: A | Discharge status: Home / Self Care | Payer: Self-pay | Source: Home / Self Care | Attending: General Surgery

## 2013-05-20 DIAGNOSIS — K430 Incisional hernia with obstruction, without gangrene: Secondary | ICD-10-CM | POA: Diagnosis not present

## 2013-05-20 HISTORY — PX: INSERTION OF MESH: SHX5868

## 2013-05-20 HISTORY — PX: INCISIONAL HERNIA REPAIR: SHX193

## 2013-05-20 SURGERY — REPAIR, HERNIA, INCISIONAL
Anesthesia: General | Site: Abdomen

## 2013-05-20 MED ORDER — GLYCOPYRROLATE 0.2 MG/ML IJ SOLN
INTRAMUSCULAR | Status: AC
  Filled 2013-05-20: qty 2

## 2013-05-20 MED ORDER — BUPIVACAINE HCL (PF) 0.5 % IJ SOLN
INTRAMUSCULAR | Status: AC
  Filled 2013-05-20: qty 30

## 2013-05-20 MED ORDER — ONDANSETRON HCL 4 MG/2ML IJ SOLN
4.0000 mg | Freq: Once | INTRAMUSCULAR | Status: AC
  Administered 2013-05-20: 4 mg via INTRAVENOUS

## 2013-05-20 MED ORDER — LABETALOL HCL 5 MG/ML IV SOLN
10.0000 mg | Freq: Once | INTRAVENOUS | Status: AC
  Administered 2013-05-20: 10 mg via INTRAVENOUS

## 2013-05-20 MED ORDER — ROCURONIUM BROMIDE 100 MG/10ML IV SOLN
INTRAVENOUS | Status: DC | PRN
  Administered 2013-05-20: 25 mg via INTRAVENOUS
  Administered 2013-05-20: 10 mg via INTRAVENOUS
  Administered 2013-05-20: 5 mg via INTRAVENOUS

## 2013-05-20 MED ORDER — KETOROLAC TROMETHAMINE 30 MG/ML IJ SOLN
30.0000 mg | Freq: Once | INTRAMUSCULAR | Status: AC
  Administered 2013-05-20: 30 mg via INTRAVENOUS

## 2013-05-20 MED ORDER — MIDAZOLAM HCL 2 MG/2ML IJ SOLN
1.0000 mg | INTRAMUSCULAR | Status: DC | PRN
  Administered 2013-05-20: 2 mg via INTRAVENOUS

## 2013-05-20 MED ORDER — FENTANYL CITRATE 0.05 MG/ML IJ SOLN
INTRAMUSCULAR | Status: AC
  Filled 2013-05-20: qty 2

## 2013-05-20 MED ORDER — LACTATED RINGERS IV SOLN
INTRAVENOUS | Status: DC
  Administered 2013-05-21 – 2013-05-22 (×2): via INTRAVENOUS

## 2013-05-20 MED ORDER — METOPROLOL TARTRATE 1 MG/ML IV SOLN
INTRAVENOUS | Status: DC | PRN
  Administered 2013-05-20: 2 mg via INTRAVENOUS
  Administered 2013-05-20: 1 mg via INTRAVENOUS
  Administered 2013-05-20: 2 mg via INTRAVENOUS

## 2013-05-20 MED ORDER — ONDANSETRON HCL 4 MG/2ML IJ SOLN
INTRAMUSCULAR | Status: AC
  Filled 2013-05-20: qty 2

## 2013-05-20 MED ORDER — POVIDONE-IODINE 10 % EX OINT
TOPICAL_OINTMENT | CUTANEOUS | Status: AC
  Filled 2013-05-20: qty 2

## 2013-05-20 MED ORDER — FENTANYL CITRATE 0.05 MG/ML IJ SOLN
INTRAMUSCULAR | Status: AC
  Filled 2013-05-20: qty 5

## 2013-05-20 MED ORDER — ONDANSETRON HCL 4 MG/2ML IJ SOLN: 4.0000 mg | Freq: Once | INTRAMUSCULAR | Status: DC | PRN

## 2013-05-20 MED ORDER — GLYCOPYRROLATE 0.2 MG/ML IJ SOLN
INTRAMUSCULAR | Status: DC | PRN
  Administered 2013-05-20: .5 mg via INTRAVENOUS

## 2013-05-20 MED ORDER — LORAZEPAM 2 MG/ML IJ SOLN
0.5000 mg | INTRAMUSCULAR | Status: DC | PRN
  Administered 2013-05-21 – 2013-05-23 (×7): 0.5 mg via INTRAVENOUS
  Filled 2013-05-20 (×7): qty 1

## 2013-05-20 MED ORDER — ONDANSETRON HCL 4 MG/2ML IJ SOLN
INTRAMUSCULAR | Status: AC
Start: 2013-05-20 — End: 2013-05-20
  Filled 2013-05-20: qty 2

## 2013-05-20 MED ORDER — LACTATED RINGERS IV SOLN
INTRAVENOUS | Status: DC
  Administered 2013-05-20: 12:00:00 via INTRAVENOUS

## 2013-05-20 MED ORDER — ROCURONIUM BROMIDE 50 MG/5ML IV SOLN
INTRAVENOUS | Status: AC
  Filled 2013-05-20: qty 1

## 2013-05-20 MED ORDER — NEOSTIGMINE METHYLSULFATE 1 MG/ML IJ SOLN
INTRAMUSCULAR | Status: DC | PRN
  Administered 2013-05-20: 3 mg via INTRAVENOUS

## 2013-05-20 MED ORDER — SODIUM CHLORIDE 0.9 % IR SOLN
Status: DC | PRN
  Administered 2013-05-20 (×2): 1000 mL

## 2013-05-20 MED ORDER — PROPOFOL 10 MG/ML IV BOLUS
INTRAVENOUS | Status: AC
  Filled 2013-05-20: qty 20

## 2013-05-20 MED ORDER — PROPOFOL 10 MG/ML IV BOLUS
INTRAVENOUS | Status: DC | PRN
  Administered 2013-05-20: 120 mg via INTRAVENOUS

## 2013-05-20 MED ORDER — LIDOCAINE HCL (PF) 1 % IJ SOLN
INTRAMUSCULAR | Status: AC
  Filled 2013-05-20: qty 5

## 2013-05-20 MED ORDER — SUCCINYLCHOLINE CHLORIDE 20 MG/ML IJ SOLN
INTRAMUSCULAR | Status: AC
  Filled 2013-05-20: qty 1

## 2013-05-20 MED ORDER — LIDOCAINE HCL 1 % IJ SOLN
INTRAMUSCULAR | Status: DC | PRN
  Administered 2013-05-20: 40 mg via INTRADERMAL

## 2013-05-20 MED ORDER — GLYCOPYRROLATE 0.2 MG/ML IJ SOLN
INTRAMUSCULAR | Status: AC
  Filled 2013-05-20: qty 1

## 2013-05-20 MED ORDER — BUPIVACAINE HCL (PF) 0.5 % IJ SOLN
INTRAMUSCULAR | Status: DC | PRN
  Administered 2013-05-20: 10 mL

## 2013-05-20 MED ORDER — POVIDONE-IODINE 10 % OINT PACKET
TOPICAL_OINTMENT | CUTANEOUS | Status: DC | PRN
  Administered 2013-05-20: 2 via TOPICAL

## 2013-05-20 MED ORDER — KETOROLAC TROMETHAMINE 30 MG/ML IJ SOLN
INTRAMUSCULAR | Status: AC
  Filled 2013-05-20: qty 1

## 2013-05-20 MED ORDER — FENTANYL CITRATE 0.05 MG/ML IJ SOLN
25.0000 ug | INTRAMUSCULAR | Status: DC | PRN
  Administered 2013-05-20 (×2): 50 ug via INTRAVENOUS

## 2013-05-20 MED ORDER — GLYCOPYRROLATE 0.2 MG/ML IJ SOLN
0.2000 mg | Freq: Once | INTRAMUSCULAR | Status: AC
  Administered 2013-05-20: 0.2 mg via INTRAVENOUS

## 2013-05-20 MED ORDER — METOPROLOL TARTRATE 1 MG/ML IV SOLN
INTRAVENOUS | Status: AC
  Filled 2013-05-20: qty 5

## 2013-05-20 MED ORDER — MIDAZOLAM HCL 2 MG/2ML IJ SOLN
INTRAMUSCULAR | Status: AC
  Filled 2013-05-20: qty 2

## 2013-05-20 MED ORDER — SUCCINYLCHOLINE CHLORIDE 20 MG/ML IJ SOLN
INTRAMUSCULAR | Status: DC | PRN
  Administered 2013-05-20: 20 mg via INTRAVENOUS

## 2013-05-20 MED ORDER — LABETALOL HCL 5 MG/ML IV SOLN
INTRAVENOUS | Status: AC
  Filled 2013-05-20: qty 4

## 2013-05-20 MED ORDER — FENTANYL CITRATE 0.05 MG/ML IJ SOLN
INTRAMUSCULAR | Status: DC | PRN
  Administered 2013-05-20 (×6): 50 ug via INTRAVENOUS
  Administered 2013-05-20: 100 ug via INTRAVENOUS
  Administered 2013-05-20: 50 ug via INTRAVENOUS

## 2013-05-20 MED ORDER — CEFOTETAN DISODIUM-DEXTROSE 2-2.08 GM-% IV SOLR
2.0000 g | INTRAVENOUS | Status: AC
  Administered 2013-05-20: 2 g via INTRAVENOUS
  Filled 2013-05-20: qty 50

## 2013-05-20 SURGICAL SUPPLY — 85 items
APPLIER CLIP 11 MED OPEN (CLIP)
APPLIER CLIP 13 LRG OPEN (CLIP)
APR CLP LRG 13 20 CLIP (CLIP)
APR CLP MED 11 20 MLT OPN (CLIP)
BAG HAMPER (MISCELLANEOUS) ×3 IMPLANT
BARRIER SKIN 2 3/4 (OSTOMY) IMPLANT
BARRIER SKIN OD2.25 2 3/4 FLNG (OSTOMY) IMPLANT
BINDER ABD UNIV 9 30-45 (GAUZE/BANDAGES/DRESSINGS) ×1 IMPLANT
BINDER ABDOMINAL 9 (GAUZE/BANDAGES/DRESSINGS) ×3
BRR SKN FLT 2.75X2.25 2 PC (OSTOMY)
CHLORAPREP W/TINT 26ML (MISCELLANEOUS) ×2 IMPLANT
CLAMP POUCH DRAINAGE QUIET (OSTOMY) IMPLANT
CLIP APPLIE 11 MED OPEN (CLIP) IMPLANT
CLIP APPLIE 13 LRG OPEN (CLIP) IMPLANT
CLOTH BEACON ORANGE TIMEOUT ST (SAFETY) ×3 IMPLANT
COVER LIGHT HANDLE STERIS (MISCELLANEOUS) ×6 IMPLANT
DECANTER SPIKE VIAL GLASS SM (MISCELLANEOUS) ×2 IMPLANT
DRAPE WARM FLUID 44X44 (DRAPE) ×3 IMPLANT
DURAPREP 26ML APPLICATOR (WOUND CARE) ×3 IMPLANT
ELECT BLADE 6 FLAT ULTRCLN (ELECTRODE) IMPLANT
ELECT REM PT RETURN 9FT ADLT (ELECTROSURGICAL) ×3
ELECTRODE REM PT RTRN 9FT ADLT (ELECTROSURGICAL) ×2 IMPLANT
EVACUATOR DRAINAGE 10X20 100CC (DRAIN) ×1 IMPLANT
EVACUATOR SILICONE 100CC (DRAIN) ×3
FORMALIN 10 PREFIL 480ML (MISCELLANEOUS) ×3 IMPLANT
GLOVE BIO SURGEON STRL SZ7.5 (GLOVE) ×6 IMPLANT
GLOVE BIOGEL PI IND STRL 7.0 (GLOVE) ×3 IMPLANT
GLOVE BIOGEL PI IND STRL 8 (GLOVE) ×2 IMPLANT
GLOVE BIOGEL PI INDICATOR 7.0 (GLOVE) ×3
GLOVE BIOGEL PI INDICATOR 8 (GLOVE) ×1
GLOVE ECLIPSE 7.0 STRL STRAW (GLOVE) ×2 IMPLANT
GLOVE ECLIPSE 7.5 STRL STRAW (GLOVE) ×3 IMPLANT
GLOVE EXAM NITRILE LRG STRL (GLOVE) ×6 IMPLANT
GLOVE SS BIOGEL STRL SZ 6.5 (GLOVE) ×1 IMPLANT
GLOVE SUPERSENSE BIOGEL SZ 6.5 (GLOVE) ×1
GOWN STRL REIN XL XLG (GOWN DISPOSABLE) ×11 IMPLANT
INST SET MAJOR GENERAL (KITS) ×3 IMPLANT
KIT REMOVER STAPLE SKIN (MISCELLANEOUS) IMPLANT
KIT ROOM TURNOVER APOR (KITS) ×3 IMPLANT
LIGASURE IMPACT 36 18CM CVD LR (INSTRUMENTS) ×2 IMPLANT
MANIFOLD NEPTUNE II (INSTRUMENTS) ×3 IMPLANT
MESH PHYSIO OVAL 10X15CM (Mesh General) ×2 IMPLANT
NDL HYPO 25X1 1.5 SAFETY (NEEDLE) ×1 IMPLANT
NEEDLE HYPO 25X1 1.5 SAFETY (NEEDLE) ×3 IMPLANT
NS IRRIG 1000ML POUR BTL (IV SOLUTION) ×5 IMPLANT
PACK ABDOMINAL MAJOR (CUSTOM PROCEDURE TRAY) ×3 IMPLANT
PAD ABD 5X9 TENDERSORB (GAUZE/BANDAGES/DRESSINGS) ×4 IMPLANT
PAD ARMBOARD 7.5X6 YLW CONV (MISCELLANEOUS) ×3 IMPLANT
POUCH OSTOMY 2 3/4  H 3804 (WOUND CARE)
POUCH OSTOMY 2 3/4 H 3804 (WOUND CARE)
POUCH OSTOMY 2 PC DRNBL 2.75 (WOUND CARE) IMPLANT
RELOAD LINEAR CUT PROX 55 BLUE (ENDOMECHANICALS) IMPLANT
RELOAD PROXIMATE 75MM BLUE (ENDOMECHANICALS) IMPLANT
RELOAD STAPLE 55 3.8 BLU REG (ENDOMECHANICALS) IMPLANT
RELOAD STAPLE 75 3.8 BLU REG (ENDOMECHANICALS) IMPLANT
RETRACTOR WND ALEXIS 25 LRG (MISCELLANEOUS) IMPLANT
RTRCTR WOUND ALEXIS 25CM LRG (MISCELLANEOUS)
SET BASIN LINEN APH (SET/KITS/TRAYS/PACK) ×3 IMPLANT
SPONGE DRAIN TRACH 4X4 STRL 2S (GAUZE/BANDAGES/DRESSINGS) ×2 IMPLANT
SPONGE GAUZE 4X4 12PLY (GAUZE/BANDAGES/DRESSINGS) ×3 IMPLANT
SPONGE LAP 18X18 X RAY DECT (DISPOSABLE) ×5 IMPLANT
STAPLER GUN LINEAR PROX 60 (STAPLE) IMPLANT
STAPLER PROXIMATE 55 BLUE (STAPLE) IMPLANT
STAPLER PROXIMATE 75MM BLUE (STAPLE) IMPLANT
STAPLER VISISTAT (STAPLE) ×3 IMPLANT
SUCTION POOLE TIP (SUCTIONS) ×1 IMPLANT
SUT CHROMIC 0 SH (SUTURE) ×1 IMPLANT
SUT CHROMIC 2 0 SH (SUTURE) ×1 IMPLANT
SUT ETHIBOND NAB MO 7 #0 18IN (SUTURE) IMPLANT
SUT ETHILON 3 0 FSL (SUTURE) ×2 IMPLANT
SUT NOVA NAB GS-21 1 T12 (SUTURE) IMPLANT
SUT NOVA NAB GS-22 2 2-0 T-19 (SUTURE) IMPLANT
SUT NOVA NAB GS-26 0 60 (SUTURE) ×6 IMPLANT
SUT PROLENE 0 CT 1 CR/8 (SUTURE) ×4 IMPLANT
SUT SILK 2 0 (SUTURE)
SUT SILK 2 0 REEL (SUTURE) ×1 IMPLANT
SUT SILK 2-0 18XBRD TIE 12 (SUTURE) ×1 IMPLANT
SUT SILK 3 0 SH CR/8 (SUTURE) ×2 IMPLANT
SUT VIC AB 2-0 CT1 27 (SUTURE) ×6
SUT VIC AB 2-0 CT1 TAPERPNT 27 (SUTURE) ×3 IMPLANT
SUT VIC AB 4-0 PS2 27 (SUTURE) IMPLANT
SYR CONTROL 10ML LL (SYRINGE) ×3 IMPLANT
TAPE CLOTH SURG 4X10 WHT LF (GAUZE/BANDAGES/DRESSINGS) ×2 IMPLANT
TOWEL BLUE STERILE X RAY DET (MISCELLANEOUS) ×3 IMPLANT
TRAY FOLEY CATH 16FR SILVER (SET/KITS/TRAYS/PACK) ×3 IMPLANT

## 2013-05-20 NOTE — Anesthesia Procedure Notes (Addendum)
Procedures

## 2013-05-20 NOTE — OR Nursing (Signed)
Zofran given to patient per verbal order Dr. Jayme Cloud to have another dose  Right after administration..  Dose documented  On Mar  , PRN record

## 2013-05-20 NOTE — OR Nursing (Signed)
Nausea relieved  

## 2013-05-20 NOTE — Anesthesia Postprocedure Evaluation (Addendum)
  Anesthesia Post-op Note  Patient: [name]  Procedure(s) Performed: Procedure(s): INCISIONAL HERNIORRHAPHY WITH MESH (N/A) INSERTION OF MESH (N/A)  Patient Location: PACU  Anesthesia Type:General  Level of Consciousness: awake, alert  and oriented  Airway and Oxygen Therapy: Patient Spontanous Breathing and Patient connected to face mask oxygen  Post-op Pain: mild  Post-op Assessment: Post-op Vital signs reviewed, Patient's Cardiovascular Status Stable, Respiratory Function Stable, Patent Airway and No signs of Nausea or vomiting  Post-op Vital Signs: Reviewed and stable  Complications: No apparent anesthesia complications 05/22/13  Patient doing well.  OOB and ambulating today.  No apparent anesthesia complications.

## 2013-05-20 NOTE — Progress Notes (Signed)
SCD"S applied 

## 2013-05-20 NOTE — Anesthesia Preprocedure Evaluation (Signed)
Anesthesia Evaluation  Patient identified by MRN, date of birth, ID band Patient awake    Reviewed: Allergy & Precautions, H&P , NPO status , Patient's Chart, lab work & pertinent test results  Airway Mallampati: II TM Distance: >3 FB     Dental  (+) Edentulous Upper and Edentulous Lower   Pulmonary Current Smoker,  breath sounds clear to auscultation        Cardiovascular hypertension, Rhythm:Regular Rate:Normal     Neuro/Psych CVA, Residual Symptoms    GI/Hepatic SBO, nauseated    Endo/Other    Renal/GU      Musculoskeletal   Abdominal   Peds  Hematology   Anesthesia Other Findings   Reproductive/Obstetrics                           Anesthesia Physical Anesthesia Plan  ASA: III  Anesthesia Plan: General   Post-op Pain Management:    Induction: Intravenous, Rapid sequence and Cricoid pressure planned  Airway Management Planned: Oral ETT  Additional Equipment:   Intra-op Plan:   Post-operative Plan: Extubation in OR  Informed Consent: I have reviewed the patients History and Physical, chart, labs and discussed the procedure including the risks, benefits and alternatives for the proposed anesthesia with the patient or authorized representative who has indicated his/her understanding and acceptance.     Plan Discussed with:   Anesthesia Plan Comments:         Anesthesia Quick Evaluation

## 2013-05-20 NOTE — Op Note (Signed)
Patient:  [name]  DOB:  10-14-1945  MRN:  [id number]   Preop Diagnosis:  Incisional hernia, incarcerated  Postop Diagnosis:  Same  Procedure:  Incisional herniorrhaphy with mesh  Surgeon:  Aviva Signs, M.D.  Anes:  General endotracheal  Indications:  Patient is a 67 year old white female who has had multiple abdominal surgeries in the past who presents with a chronic enlarged incisional hernia containing both small and large bowel. There is also component of obstruction. The patient now comes the operating room for an incisional herniorrhaphy with mesh, possible bowel resection. The risks and benefits of the procedure including bleeding, infection, cardiopulmonary difficulties, the possibly of a bowel resection, and a possibly recurrence of the hernia were fully explained to the patient, who gave informed consent.  Procedure note:  The patient is placed the supine position. After induction of general endotracheal anesthesia, the abdomen was prepped and draped using usual sterile technique with DuraPrep. Surgical site confirmation was performed.  A midline incision was made to the previous surgical scar that was supraumbilical to just below the umbilicus. The dissection was taken down to the fascial layer. The patient was noted to have a large incisional hernia with a well-developed hernia sac extending into the left lower quadrant subcutaneously. The hernia sac was entered into without difficulty and in order to ligate adhesions to the hernia sac in order to reduce the bowel and omental contents. These were lysed using both Bovie electrocautery and the LigaSure. The hernia contents were then reduced. The bowel was inspected and there was no evidence of stricture formation or ischemia. The hernia sac was then excised at the level of the fascia. The hernia sac was disposed of. This ultimately resulted in a 5 x 8 cm hernia defect. A 10 x 15 cm physio-composite mesh was then inserted into the  abdominal cavity and and secured intraperitoneally in a parachute like fashion using 0 Prolene interrupted sutures. Care was taken to assure at least a 2 cm overhang of mesh from the edge of the fascial defect. A #10 flat Jackson-Pratt drain was then placed into the subcutaneous pocket were hernia was at. It was secured at the skin level using 3-0 nylon interrupted suture. The subcutaneous layer was reapproximated using a 2-0 Vicryl interrupted suture. The skin was closed using staples. 0.5% Sensorcaine was instilled the surrounding wound. Betadine ointment, dry sterile dressings, and an abdominal binder were applied.  All tape and needle counts were correct at the end of the procedure. Patient was extubated in the operating room and transferred to PACU in stable condition.  Complications:  None  EBL:  50 cc  Specimen:  None

## 2013-05-20 NOTE — Transfer of Care (Signed)
Immediate Anesthesia Transfer of Care Note  Patient: [name]  Procedure(s) Performed: Procedure(s): INCISIONAL HERNIORRHAPHY WITH MESH (N/A) INSERTION OF MESH (N/A)  Patient Location: PACU  Anesthesia Type:General  Level of Consciousness: awake, alert  and oriented  Airway & Oxygen Therapy: Patient Spontanous Breathing and Patient connected to face mask oxygen  Post-op Assessment: Report given to PACU RN  Post vital signs: Reviewed and stable  BP remains elevated, labatelol ordered.  Complications: No apparent anesthesia complications

## 2013-05-21 LAB — CBC
HCT: 38.6 % (ref 36.0–46.0)
Hemoglobin: 13.1 g/dL (ref 12.0–15.0)
MCH: 32.3 pg (ref 26.0–34.0)
MCHC: 33.9 g/dL (ref 30.0–36.0)
MCV: 95.3 fL (ref 78.0–100.0)
Platelets: 244 10*3/uL (ref 150–400)
RBC: 4.05 MIL/uL (ref 3.87–5.11)

## 2013-05-21 LAB — MAGNESIUM: Magnesium: 2.5 mg/dL (ref 1.5–2.5)

## 2013-05-21 LAB — BASIC METABOLIC PANEL
BUN: 16 mg/dL (ref 6–23)
CO2: 25 mEq/L (ref 19–32)
Calcium: 8.4 mg/dL (ref 8.4–10.5)
Chloride: 96 mEq/L (ref 96–112)
Creatinine, Ser: 1.12 mg/dL — ABNORMAL HIGH (ref 0.50–1.10)
GFR calc Af Amer: 57 mL/min — ABNORMAL LOW (ref >90)

## 2013-05-21 LAB — TYPE AND SCREEN: ABO/RH(D): O POS

## 2013-05-21 MED ORDER — ALUM & MAG HYDROXIDE-SIMETH 200-200-20 MG/5ML PO SUSP: 30.0000 mL | ORAL | Status: DC | PRN

## 2013-05-21 MED ORDER — PANTOPRAZOLE SODIUM 40 MG PO TBEC
40.0000 mg | DELAYED_RELEASE_TABLET | Freq: Every day | ORAL | Status: DC
  Administered 2013-05-21 – 2013-05-23 (×3): 40 mg via ORAL
  Filled 2013-05-21 (×3): qty 1

## 2013-05-21 NOTE — Progress Notes (Signed)
05/21/13 1336 Pt ambulated in hallway with nurse supervision/stand-by assist. Ambulated approximately 100 feet, tolerated fair, c/o generalized abdominal soreness with movement. Up to chair on return to room. Chair alarm on for safety, call light within reach, instructed to call for assist. Earnstine Regal, RN

## 2013-05-21 NOTE — Progress Notes (Signed)
1 Day Post-Op  Subjective: Moderate incisional pain.  Objective: Vital signs in last 24 hours: Temp:  [97.2 F (36.2 C)-98.1 F (36.7 C)] 97.9 F (36.6 C) (12/06 0448) Pulse Rate:  [68-95] 95 (12/06 0448) Resp:  [12-90] 20 (12/06 0448) BP: (103-211)/(63-147) 122/76 mmHg (12/06 0448) SpO2:  [91 %-100 %] 95 % (12/06 0448) Last BM Date: 05/16/13  Intake/Output from previous day: 12/05 0701 - 12/06 0700 In: 1800 [I.V.:1800] Out: 900 [Urine:750; Drains:100; Blood:50] Intake/Output this shift: Total I/O In: -  Out: 200 [Urine:200]  General appearance: alert and cooperative Resp: clear to auscultation bilaterally Cardio: regular rate and rhythm, S1, S2 normal, no murmur, click, rub or gallop GI: Soft. JP drainage serosanguineous in nature. Dressing dry and intact.  Lab Results:   Recent Labs  05/21/13 0647  WBC 12.5*  HGB 13.1  HCT 38.6  PLT 244   BMET  Recent Labs  05/21/13 0647  NA 136  K 5.1  CL 96  CO2 25  GLUCOSE 102*  BUN 16  CREATININE 1.12*  CALCIUM 8.4   PT/INR No results found for this basename: LABPROT, INR,  in the last 72 hours  Studies/Results: No results found.  Anti-infectives: Anti-infectives   Start     Dose/Rate Route Frequency Ordered Stop   05/20/13 1045  cefoTEtan in Dextrose 5% (CEFOTAN) IVPB 2 g     2 g Intravenous On call to O.R. 05/20/13 1036 05/20/13 1209   05/19/13 1015  cefoTEtan (CEFOTAN) 2 g in dextrose 5 % 50 mL IVPB  Status:  Discontinued     2 g 100 mL/hr over 30 Minutes Intravenous Every 12 hours 05/19/13 1003 05/19/13 1019   05/14/13 1630  cefTRIAXone (ROCEPHIN) 1 g in dextrose 5 % 50 mL IVPB     1 g 100 mL/hr over 30 Minutes Intravenous  Once 05/14/13 1616 05/14/13 1733      Assessment/Plan: s/p Procedure(s): INCISIONAL HERNIORRHAPHY WITH MESH INSERTION OF MESH Impression: Stable, postoperative day one. Plan: Will remove NG tube. We'll get patient up to chair. Adjust IV fluids.  LOS: 7 days     Jade Burright A 05/21/2013

## 2013-05-22 LAB — BASIC METABOLIC PANEL
BUN: 16 mg/dL (ref 6–23)
CO2: 26 mEq/L (ref 19–32)
Calcium: 8.6 mg/dL (ref 8.4–10.5)
Chloride: 98 mEq/L (ref 96–112)
Creatinine, Ser: 1.08 mg/dL (ref 0.50–1.10)
GFR calc non Af Amer: 52 mL/min — ABNORMAL LOW (ref >90)
Glucose, Bld: 102 mg/dL — ABNORMAL HIGH (ref 70–99)
Sodium: 135 mEq/L (ref 135–145)

## 2013-05-22 LAB — CBC
Hemoglobin: 11.5 g/dL — ABNORMAL LOW (ref 12.0–15.0)
MCH: 32 pg (ref 26.0–34.0)
MCHC: 33.1 g/dL (ref 30.0–36.0)
MCV: 96.7 fL (ref 78.0–100.0)
Platelets: 236 10*3/uL (ref 150–400)
RBC: 3.59 MIL/uL — ABNORMAL LOW (ref 3.87–5.11)
RDW: 12.6 % (ref 11.5–15.5)
WBC: 9.4 10*3/uL (ref 4.0–10.5)

## 2013-05-22 MED ORDER — MAGNESIUM HYDROXIDE 400 MG/5ML PO SUSP
30.0000 mL | Freq: Two times a day (BID) | ORAL | Status: DC
  Administered 2013-05-22 – 2013-05-23 (×3): 30 mL via ORAL
  Filled 2013-05-22 (×4): qty 30

## 2013-05-22 NOTE — Plan of Care (Signed)
Problem: Phase I Progression Outcomes Goal: OOB as tolerated unless otherwise ordered Outcome: Progressing 05/22/13 1125 Up to chair and ambulates in room/hallway with standby assist. Reminded patient to call for assistance and not attempt getting up on her own. Call light kept within reach, bedalarm/chairalarm in use for safety. Earnstine Regal, RN

## 2013-05-22 NOTE — Progress Notes (Signed)
05/22/13 1121 Patient ambulated in hallway with nurse tech standby assist. Witnessed by nurse, ambulated approximately 200 feet, tolerated well. Earnstine Regal, RN

## 2013-05-22 NOTE — Progress Notes (Signed)
2 Days Post-Op  Subjective: No significant abdominal pain. No nausea or vomiting.  Objective: Vital signs in last 24 hours: Temp:  [97.4 F (36.3 C)-98.2 F (36.8 C)] 98.2 F (36.8 C) (12/07 0650) Pulse Rate:  [91-106] 98 (12/07 0650) Resp:  [20] 20 (12/07 0650) BP: (120-154)/(75-92) 124/78 mmHg (12/07 0650) SpO2:  [96 %] 96 % (12/07 0650) Last BM Date: 05/21/13 (per patient)  Intake/Output from previous day: 12/06 0701 - 12/07 0700 In: 2070.4 [P.O.:360; I.V.:1710.4] Out: 1030 [Urine:1000; Drains:30] Intake/Output this shift: Total I/O In: -  Out: 20 [Drains:20]  General appearance: alert, cooperative and no distress Resp: clear to auscultation bilaterally Cardio: regular rate and rhythm, S1, S2 normal, no murmur, click, rub or gallop GI: Soft. Incision healing well. JP drainage serosanguineous in low. positive bowel sounds.  Lab Results:   Recent Labs  05/21/13 0647 05/22/13 0551  WBC 12.5* 9.4  HGB 13.1 11.5*  HCT 38.6 34.7*  PLT 244 236   BMET  Recent Labs  05/21/13 0647 05/22/13 0551  NA 136 135  K 5.1 4.1  CL 96 98  CO2 25 26  GLUCOSE 102* 102*  BUN 16 16  CREATININE 1.12* 1.08  CALCIUM 8.4 8.6   PT/INR No results found for this basename: LABPROT, INR,  in the last 72 hours  Studies/Results: No results found.  Anti-infectives: Anti-infectives   Start     Dose/Rate Route Frequency Ordered Stop   05/20/13 1045  cefoTEtan in Dextrose 5% (CEFOTAN) IVPB 2 g     2 g Intravenous On call to O.R. 05/20/13 1036 05/20/13 1209   05/19/13 1015  cefoTEtan (CEFOTAN) 2 g in dextrose 5 % 50 mL IVPB  Status:  Discontinued     2 g 100 mL/hr over 30 Minutes Intravenous Every 12 hours 05/19/13 1003 05/19/13 1019   05/14/13 1630  cefTRIAXone (ROCEPHIN) 1 g in dextrose 5 % 50 mL IVPB     1 g 100 mL/hr over 30 Minutes Intravenous  Once 05/14/13 1616 05/14/13 1733      Assessment/Plan: s/p Procedure(s): INCISIONAL HERNIORRHAPHY WITH MESH INSERTION OF  MESH Impression: Status post 2, stable. We'll advance diet once bowel function returned.  LOS: 8 days    [name] A 05/22/2013

## 2013-05-23 ENCOUNTER — Encounter (HOSPITAL_COMMUNITY): Payer: Self-pay | Admitting: General Surgery

## 2013-05-23 MED ORDER — POLYETHYLENE GLYCOL 3350 17 G PO PACK
17.0000 g | PACK | Freq: Every day | ORAL | Status: DC
  Administered 2013-05-23: 17 g via ORAL
  Filled 2013-05-23: qty 1

## 2013-05-23 NOTE — Progress Notes (Signed)
3 Days Post-Op  Subjective: Minimal abdominal pain. No nausea or vomiting. No flatus or bowel movement yet.  Objective: Vital signs in last 24 hours: Temp:  [98.1 F (36.7 C)-98.4 F (36.9 C)] 98.1 F (36.7 C) (12/08 0546) Pulse Rate:  [87-95] 87 (12/08 0546) Resp:  [18-20] 20 (12/08 0546) BP: (129-138)/(73-85) 138/73 mmHg (12/08 0546) SpO2:  [94 %-98 %] 94 % (12/08 0546) Last BM Date: 05/18/13 (No BM Last night. Pt forgetful)  Intake/Output from previous day: 12/07 0701 - 12/08 0700 In: 682.9 [I.V.:682.9] Out: 10 [Drains:10] Intake/Output this shift:    General appearance: alert, cooperative and no distress Resp: clear to auscultation bilaterally Cardio: regular rate and rhythm, S1, S2 normal, no murmur, click, rub or gallop GI: Soft. Incision healing well. JP drainage serosanguineous in nature. Bowel sounds appreciated.  Lab Results:   Recent Labs  05/21/13 0647 05/22/13 0551  WBC 12.5* 9.4  HGB 13.1 11.5*  HCT 38.6 34.7*  PLT 244 236   BMET  Recent Labs  05/21/13 0647 05/22/13 0551  NA 136 135  K 5.1 4.1  CL 96 98  CO2 25 26  GLUCOSE 102* 102*  BUN 16 16  CREATININE 1.12* 1.08  CALCIUM 8.4 8.6   PT/INR No results found for this basename: LABPROT, INR,  in the last 72 hours  Studies/Results: No results found.  Anti-infectives: Anti-infectives   Start     Dose/Rate Route Frequency Ordered Stop   05/20/13 1045  cefoTEtan in Dextrose 5% (CEFOTAN) IVPB 2 g     2 g Intravenous On call to O.R. 05/20/13 1036 05/20/13 1209   05/19/13 1015  cefoTEtan (CEFOTAN) 2 g in dextrose 5 % 50 mL IVPB  Status:  Discontinued     2 g 100 mL/hr over 30 Minutes Intravenous Every 12 hours 05/19/13 1003 05/19/13 1019   05/14/13 1630  cefTRIAXone (ROCEPHIN) 1 g in dextrose 5 % 50 mL IVPB     1 g 100 mL/hr over 30 Minutes Intravenous  Once 05/14/13 1616 05/14/13 1733      Assessment/Plan: s/p Procedure(s): INCISIONAL HERNIORRHAPHY WITH MESH INSERTION OF  MESH Impression: Stable postoperative day 3. Awaiting return of bowel function. We'll advance to full liquid diet.  LOS: 9 days    [name] A 05/23/2013

## 2013-05-23 NOTE — Progress Notes (Signed)
Nutrition Brief Note  Patient identified on the Malnutrition Screening Tool (MST) Report and Length of stay. She is 3-days s/p incisional herniorrhaphy with mesh. Waiting for return of bowel function.  Wt Readings from Last 15 Encounters:  05/14/13 142 lb 10.2 oz (64.7 kg)  05/14/13 142 lb 10.2 oz (64.7 kg)  12/14/12 140 lb (63.504 kg)  07/08/12 142 lb 6.4 oz (64.592 kg)  01/19/12 137 lb (62.143 kg)  01/08/12 139 lb 12.8 oz (63.413 kg)  06/26/11 136 lb 6.4 oz (61.871 kg)  06/17/11 134 lb (60.782 kg)  05/31/11 132 lb 15 oz (60.3 kg)  01/13/11 141 lb 8 oz (64.184 kg)  10/11/10 149 lb (67.586 kg)    Body mass index is 25.27 kg/(m^2). Patient meets criteria for overweight based on current BMI.   Current diet order is full liquids, patient is consuming approximately 50-75% of meals at this time. Complaining of being hungry and is ready for "real food". She refuses oral supplement and says they are too sweet. Labs and medications reviewed.   No nutrition interventions warranted at this time. If nutrition issues arise, please consult RD.   Royann Shivers MS,RD,CSG,LDN Office: [phone] Pager: [phone]

## 2013-05-23 NOTE — Progress Notes (Signed)
UR chart review completed.  

## 2013-05-24 MED ORDER — HYDROCODONE-ACETAMINOPHEN 5-325 MG PO TABS: 1.0000 | ORAL_TABLET | ORAL | Status: DC | PRN

## 2013-05-24 NOTE — Progress Notes (Signed)
Pt a/o.vss. Saline lock removed. Abdominal binder on and intact. Up ad lib. No complaints of any distress. Discharge instructions given. Prescriptions given. Pt verbalized understanding of instructions. Awaiting for family to arrive for discharge.

## 2013-05-24 NOTE — Progress Notes (Signed)
UR chart review completed.  

## 2013-05-24 NOTE — Discharge Summary (Signed)
Physician Discharge Summary  Patient ID: [name] MRN: [id number] DOB/AGE: January 11, 1946 67 y.o.  Admit date: 05/14/2013 Discharge date: 05/24/2013  Admission Diagnoses: Incarcerated incisional hernia, partial bowel obstruction, hypertension  Discharge Diagnoses: Same Active Problems:   Small bowel obstruction   Discharged Condition: good  Hospital Course: Patient is a 67 year old white female known history of a large incisional hernia the small and large bowel present within it who presented tape and hospital with worsening nausea and vomiting. CT scan the abdomen revealed an incarcerated hernia with a partial small bowel obstruction. Initially, this was treated conservatively with an NG tube. An NG tube was removed and she was started on a diet. She did not tolerate an oral diet. She subsequently underwent an incarcerated incisional herniorrhaphy with mesh on 05/20/2013 patient tolerated procedure well. Postoperative course has been for the most part unremarkable. Her diet was advanced without difficulty once her bowel function returned. She is being discharged home on 05/24/2013 in good improving condition.  Treatments: surgery: Incisional herniorrhaphy with mesh, incarcerated, on 05/20/2013  Discharge Exam: Blood pressure 141/84, pulse 97, temperature 97.5 F (36.4 C), temperature source Oral, resp. rate 20, height 5' 3"  (1.6 m), weight 64.7 kg (142 lb 10.2 oz), SpO2 91.00%. General appearance: alert, cooperative and no distress Resp: clear to auscultation bilaterally Cardio: regular rate and rhythm, S1, S2 normal, no murmur, click, rub or gallop GI: Soft. Incision healing well. JP drain removed.  Disposition: 01-Home or Self Care     Medication List         acetaminophen 500 MG tablet  Commonly known as:  TYLENOL  Take 1,000 mg by mouth daily as needed for mild pain or moderate pain.     aspirin 81 MG tablet  Take 81 mg by mouth daily.     DELZICOL 400 MG Cpdr DR  capsule  Generic drug:  Mesalamine  TAKE 2 CAPSULES BY MOUTH TWICE DAILY.     HYDROcodone-acetaminophen 5-325 MG per tablet  Commonly known as:  NORCO/VICODIN  Take 1 tablet by mouth every 4 (four) hours as needed for moderate pain.     lisinopril 20 MG tablet  Commonly known as:  PRINIVIL,ZESTRIL  Take 1/2 tablet po qd     ondansetron 4 MG tablet  Commonly known as:  ZOFRAN  Take 4 mg by mouth every 8 (eight) hours as needed.           Follow-up Information   Follow up with Jamesetta So, MD. Schedule an appointment as soon as possible for a visit on 05/31/2013.   Specialty:  General Surgery   Contact information:   [id number] [place] [address] [phone]       Signed: Aviva Signs A 05/24/2013, 9:44 AM

## 2013-05-26 ENCOUNTER — Encounter (INDEPENDENT_AMBULATORY_CARE_PROVIDER_SITE_OTHER): Payer: Self-pay | Admitting: *Deleted

## 2013-05-26 ENCOUNTER — Other Ambulatory Visit (INDEPENDENT_AMBULATORY_CARE_PROVIDER_SITE_OTHER): Payer: Self-pay | Admitting: *Deleted

## 2013-05-26 DIAGNOSIS — K5010 Crohn's disease of large intestine without complications: Secondary | ICD-10-CM

## 2013-06-14 DIAGNOSIS — B351 Tinea unguium: Secondary | ICD-10-CM | POA: Diagnosis not present

## 2013-06-14 DIAGNOSIS — I739 Peripheral vascular disease, unspecified: Secondary | ICD-10-CM | POA: Diagnosis not present

## 2013-06-23 IMAGING — CR DG ABDOMEN ACUTE W/ 1V CHEST
3 series · 3 of 3 positions shown · non-contrast
Comparison: CT 09/27/2010

CLINICAL DATA: Rule out small bowel obstruction.  Vomiting.
History of Crohn disease.

ACUTE ABDOMEN SERIES (ABDOMEN 2 VIEW & CHEST 1 VIEW)

[view not recorded (1 of 3)]
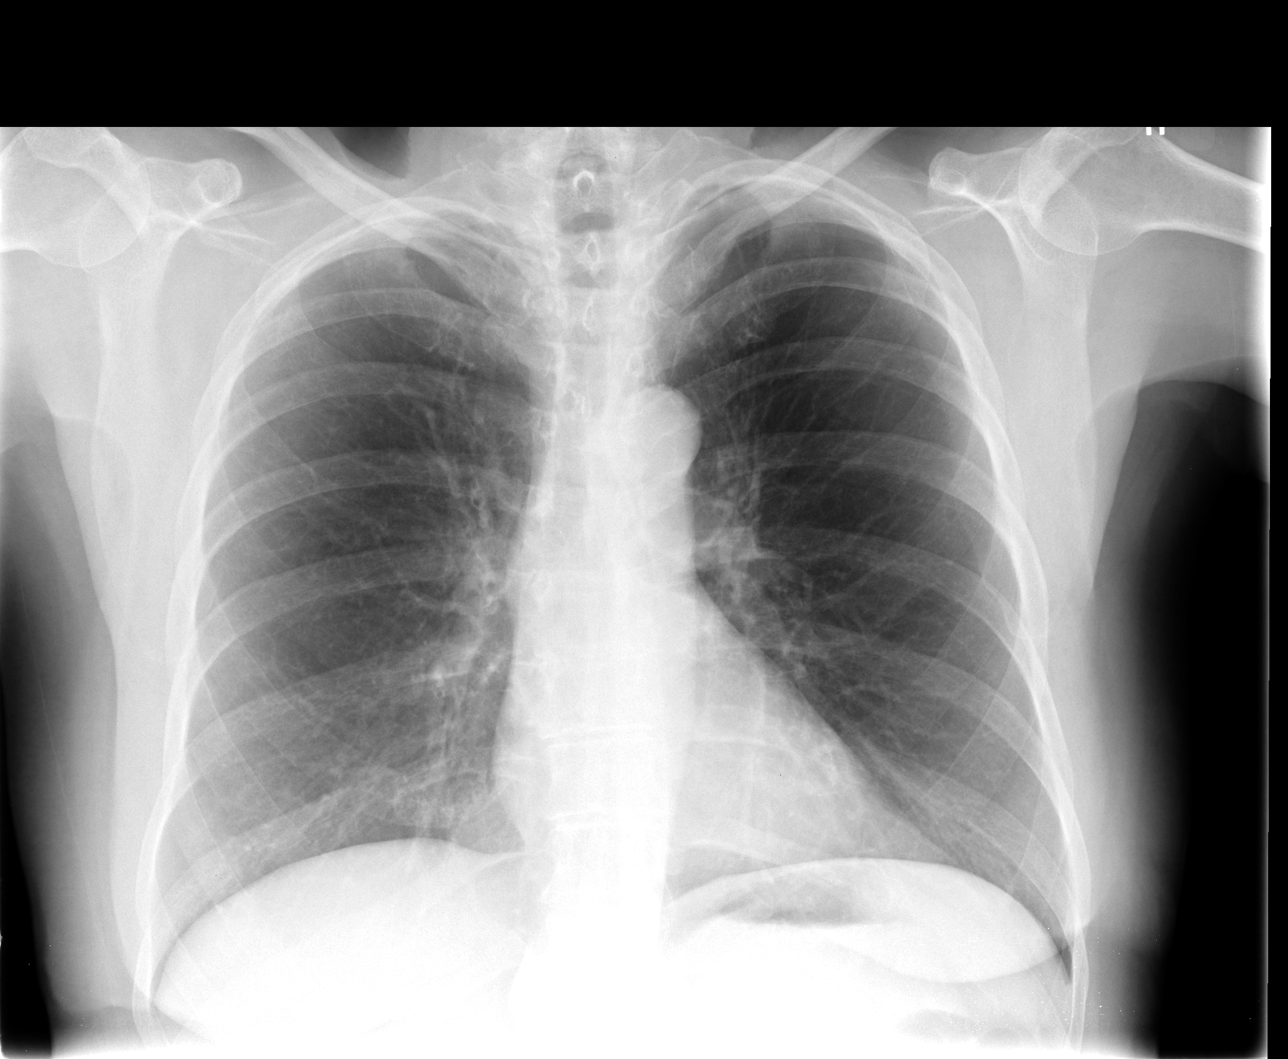

[view not recorded (2 of 3)]
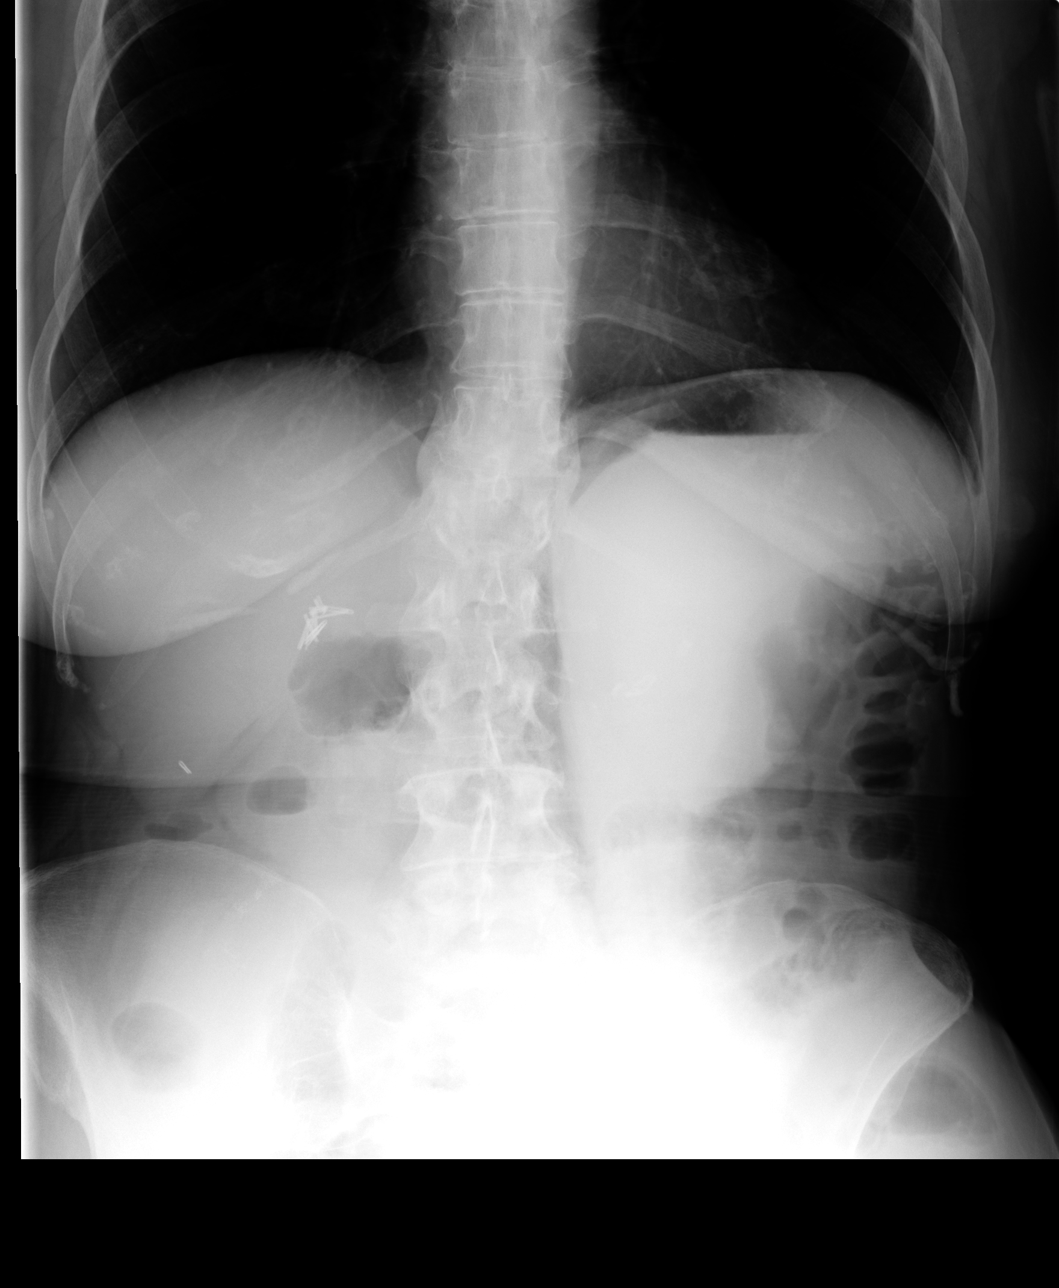

[view not recorded (3 of 3)]
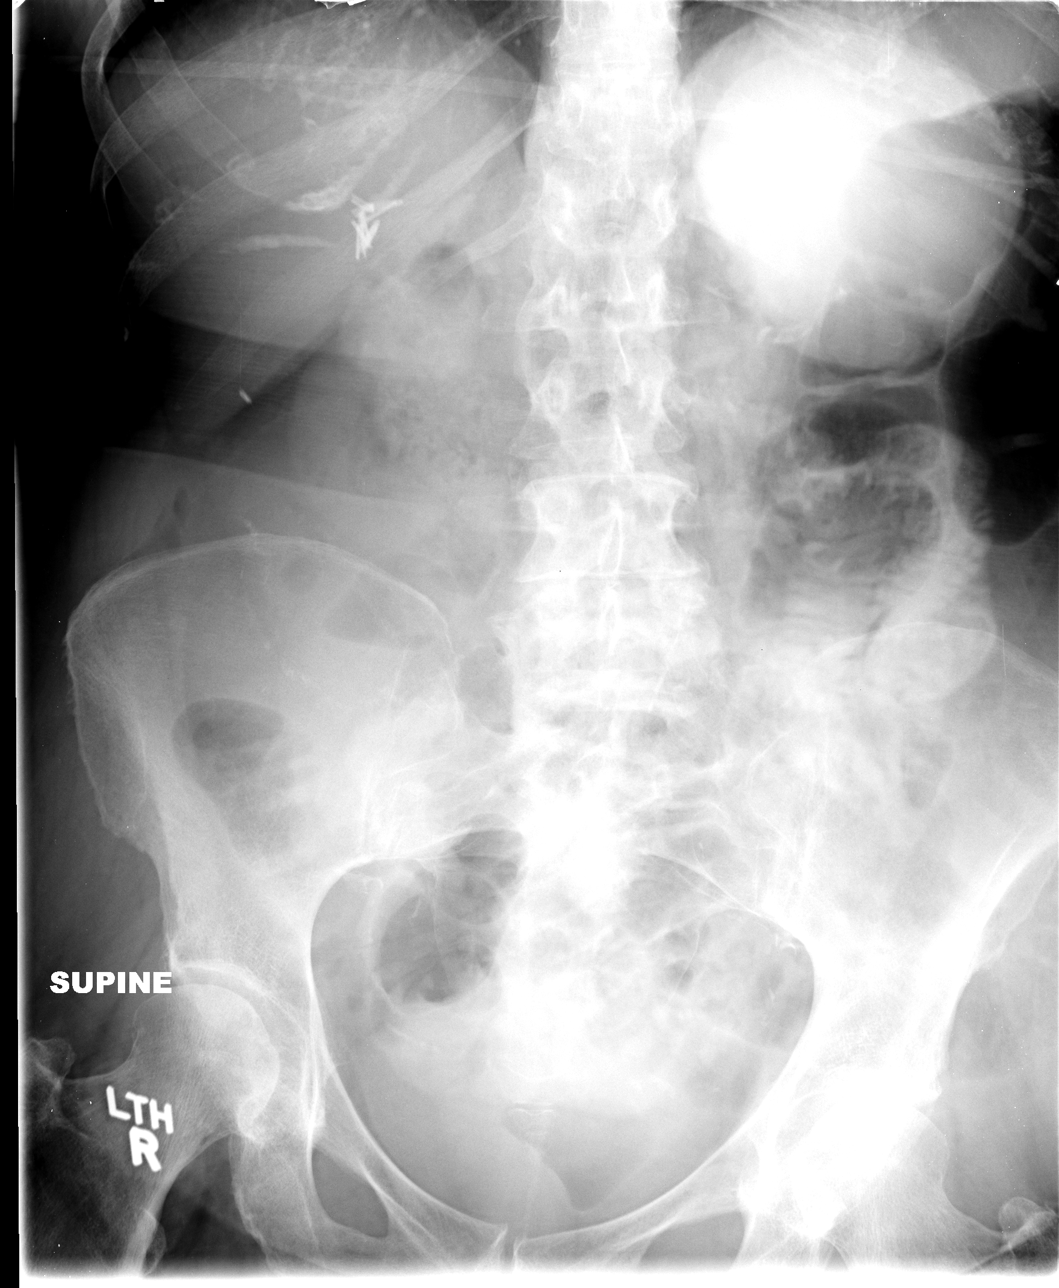

[3 of 3 positions shown; findings below may reference images not displayed]

FINDINGS: Heart size is normal.  The lungs are free of focal
consolidations and pleural effusions.  No evidence for free
intraperitoneal air.

Surgical clips are identified in the right upper quadrant of the
abdomen.  There is mild dilatation of small bowel loops.  Colonic
gas is present.  Findings are consistent with focal ileus or
early/partial small bowel obstruction.  Visualized osseous
structures have a normal appearance.
IMPRESSION: 1. No evidence for acute cardiopulmonary abnormality.
2.  Findings consistent with focal ileus versus early/partial small
bowel obstruction.

## 2013-06-29 ENCOUNTER — Encounter (INDEPENDENT_AMBULATORY_CARE_PROVIDER_SITE_OTHER): Payer: Self-pay | Admitting: *Deleted

## 2013-07-11 ENCOUNTER — Ambulatory Visit (INDEPENDENT_AMBULATORY_CARE_PROVIDER_SITE_OTHER): Payer: Medicare Other | Admitting: Internal Medicine

## 2013-07-11 ENCOUNTER — Telehealth (INDEPENDENT_AMBULATORY_CARE_PROVIDER_SITE_OTHER): Payer: Self-pay | Admitting: Internal Medicine

## 2013-07-11 ENCOUNTER — Encounter (INDEPENDENT_AMBULATORY_CARE_PROVIDER_SITE_OTHER): Payer: Self-pay | Admitting: Internal Medicine

## 2013-07-11 VITALS — BP 112/82 | HR 84 | Temp 98.0°F | Ht 63.0 in | Wt 131.0 lb

## 2013-07-11 DIAGNOSIS — K5090 Crohn's disease, unspecified, without complications: Secondary | ICD-10-CM | POA: Diagnosis not present

## 2013-07-11 LAB — CBC WITH DIFFERENTIAL/PLATELET
BASOS ABS: 0 10*3/uL (ref 0.0–0.1)
BASOS PCT: 0 % (ref 0–1)
EOS ABS: 0.1 10*3/uL (ref 0.0–0.7)
EOS PCT: 1 % (ref 0–5)
HCT: 39.9 % (ref 36.0–46.0)
Hemoglobin: 13.3 g/dL (ref 12.0–15.0)
Lymphocytes Relative: 22 % (ref 12–46)
Lymphs Abs: 2.1 10*3/uL (ref 0.7–4.0)
MCH: 31.7 pg (ref 26.0–34.0)
MCHC: 33.3 g/dL (ref 30.0–36.0)
MCV: 95.2 fL (ref 78.0–100.0)
Monocytes Absolute: 0.6 10*3/uL (ref 0.1–1.0)
Monocytes Relative: 6 % (ref 3–12)
Neutro Abs: 7 10*3/uL (ref 1.7–7.7)
Neutrophils Relative %: 71 % (ref 43–77)
PLATELETS: 247 10*3/uL (ref 150–400)
RBC: 4.19 MIL/uL (ref 3.87–5.11)
RDW: 14.3 % (ref 11.5–15.5)
WBC: 9.8 10*3/uL (ref 4.0–10.5)

## 2013-07-11 LAB — SEDIMENTATION RATE: Sed Rate: 19 mm/hr (ref 0–22)

## 2013-07-11 NOTE — Progress Notes (Signed)
Subjective:     Patient ID: [name], female   DOB: 1945-08-25, 68 y.o.   MRN: [id number]  HPI Here today for f/u of her Crohn's disease. She was last seen in January of 2014. She tells me she is doing good. Recently underwent a herniorrhaphy by Dr. Arnoldo Morale in December for an incerated hernia by Dr. Arnoldo Morale. Appetite is good. She has lost 8 pounds since her lat visit in January.    She denies any abdominal. She has 2-3 stools a day and are formed. Sometimes her stools are loose. She refuses to have a colonoscopy.    07/08/2012 WBC 8.5, H and H 13.3 and 39. MCV 94.9, Platelet ct 205. Sedrate 9.   Procedure: Incisional herniorrhaphy with mesh  For an incarcerated incisional hernia. In December of 2014 by Dr. Arnoldo Morale.      In January of 2008 she underwent a resection of the terminal ileum and cecum, ileocectomy. [name] presented with recurrent episode of intra-abdominal abscess with perforated Crohn's disease. Biopsy revealed chronic active inflammation with ulceration and abscess formation consistent with Crohn's disease.  Her last admission was to [facility] on September 26, 2010 for abdominal pain. She underwent a CT of the abdomen and pelvis of 09/27/2010 which revealed a very large ventral hernia containing herniated stomach, small bowel and colon. Partial small bowel obstruction. This is most likely due to small bowel scarring and/or adhesions formation associated with patient's Crohn's disease and previous surgery.   Review of Systems Past Medical History  Diagnosis Date  . Crohn's disease   . Hypertension   . Stroke   . Arthritis   . Sciatica of right side   . Degenerative disc disease, lumbar   . Ventral hernia    Past Surgical History  Procedure Laterality Date  . Cholecystectomy      2008  . Carotid endarterectomy      2004  . Hemicolectomy      right  . Tubal ligation    . Incisional hernia repair N/A 05/20/2013    Procedure: [name] HERNIORRHAPHY WITH MESH;  Surgeon:  Jamesetta So, MD;  Location: [facility];  Service: General;  Laterality: N/A;  . Insertion of mesh N/A 05/20/2013    Procedure: INSERTION OF MESH;  Surgeon: Jamesetta So, MD;  Location: [facility];  Service: General;  Laterality: N/A;   Allergies  Allergen Reactions  . Flagyl [Metronidazole Hcl] Other (See Comments)    "makes me feel whoosy and weird"       Objective:   Physical Exam  Filed Vitals:   07/11/13 0909  BP: 112/82  Pulse: 84  Temp: 98 F (36.7 C)  Height: 5' 3"  (1.6 m)  Weight: 131 lb (59.421 kg)  Alert and oriented. Skin warm and dry. Oral mucosa is moist.   . Sclera anicteric, conjunctivae is pink. Thyroid not enlarged. No cervical lymphadenopathy. Lungs clear. Heart regular rate and rhythm.  Abdomen is soft. Bowel sounds are positive. No hepatomegaly. No abdominal masses felt. No tenderness.  No edema to lower extremities. Midline scar from recent hernia surgery.  Rectal exam: Stool brown and guaiac negative.      Assessment:      Crohn's disease. She seems to be in remission. Presently taking Delzicol 470m 2 BID for maintenqance.   Plan:    CBC, sedrate. OV in 1 yr with Dr. RLaural Golden Please note,patient has declined a colonoscopy again at this visit. Continue Delzicol.

## 2013-07-11 NOTE — Patient Instructions (Signed)
CBC and sed rate. OV in 1 yr with Dr. Laural Golden

## 2013-07-12 NOTE — Telephone Encounter (Signed)
error 

## 2013-08-23 DIAGNOSIS — I739 Peripheral vascular disease, unspecified: Secondary | ICD-10-CM | POA: Diagnosis not present

## 2013-08-23 DIAGNOSIS — B351 Tinea unguium: Secondary | ICD-10-CM | POA: Diagnosis not present

## 2013-11-01 DIAGNOSIS — B351 Tinea unguium: Secondary | ICD-10-CM | POA: Diagnosis not present

## 2013-11-01 DIAGNOSIS — I739 Peripheral vascular disease, unspecified: Secondary | ICD-10-CM | POA: Diagnosis not present

## 2014-01-12 ENCOUNTER — Other Ambulatory Visit (INDEPENDENT_AMBULATORY_CARE_PROVIDER_SITE_OTHER): Payer: Self-pay | Admitting: Internal Medicine

## 2014-01-12 NOTE — Telephone Encounter (Signed)
Per Dr.Rehman may refill with 5 refills. 

## 2014-02-03 DIAGNOSIS — I739 Peripheral vascular disease, unspecified: Secondary | ICD-10-CM | POA: Diagnosis not present

## 2014-02-03 DIAGNOSIS — B351 Tinea unguium: Secondary | ICD-10-CM | POA: Diagnosis not present

## 2014-02-09 DIAGNOSIS — I10 Essential (primary) hypertension: Secondary | ICD-10-CM | POA: Diagnosis not present

## 2014-02-09 DIAGNOSIS — J0100 Acute maxillary sinusitis, unspecified: Secondary | ICD-10-CM | POA: Diagnosis not present

## 2014-02-09 DIAGNOSIS — IMO0002 Reserved for concepts with insufficient information to code with codable children: Secondary | ICD-10-CM | POA: Diagnosis not present

## 2014-03-13 DIAGNOSIS — Z23 Encounter for immunization: Secondary | ICD-10-CM | POA: Diagnosis not present

## 2014-03-27 DIAGNOSIS — H25031 Anterior subcapsular polar age-related cataract, right eye: Secondary | ICD-10-CM | POA: Diagnosis not present

## 2014-04-13 ENCOUNTER — Encounter (HOSPITAL_COMMUNITY): Payer: Self-pay | Admitting: Emergency Medicine

## 2014-04-13 ENCOUNTER — Emergency Department (HOSPITAL_COMMUNITY): Payer: Medicare Other

## 2014-04-13 ENCOUNTER — Emergency Department (HOSPITAL_COMMUNITY)
Admission: EM | Admit: 2014-04-13 | Discharge: 2014-04-14 | Disposition: A | Discharge status: Home / Self Care | Payer: Medicare Other | Attending: Emergency Medicine | Admitting: Emergency Medicine

## 2014-04-13 DIAGNOSIS — Z79899 Other long term (current) drug therapy: Secondary | ICD-10-CM | POA: Insufficient documentation

## 2014-04-13 DIAGNOSIS — I10 Essential (primary) hypertension: Secondary | ICD-10-CM | POA: Diagnosis not present

## 2014-04-13 DIAGNOSIS — K5090 Crohn's disease, unspecified, without complications: Secondary | ICD-10-CM | POA: Insufficient documentation

## 2014-04-13 DIAGNOSIS — Z720 Tobacco use: Secondary | ICD-10-CM | POA: Diagnosis not present

## 2014-04-13 DIAGNOSIS — K429 Umbilical hernia without obstruction or gangrene: Secondary | ICD-10-CM | POA: Diagnosis not present

## 2014-04-13 DIAGNOSIS — Z8673 Personal history of transient ischemic attack (TIA), and cerebral infarction without residual deficits: Secondary | ICD-10-CM | POA: Insufficient documentation

## 2014-04-13 DIAGNOSIS — Z7982 Long term (current) use of aspirin: Secondary | ICD-10-CM | POA: Insufficient documentation

## 2014-04-13 DIAGNOSIS — Z9851 Tubal ligation status: Secondary | ICD-10-CM | POA: Insufficient documentation

## 2014-04-13 DIAGNOSIS — K2970 Gastritis, unspecified, without bleeding: Secondary | ICD-10-CM | POA: Diagnosis not present

## 2014-04-13 DIAGNOSIS — R52 Pain, unspecified: Secondary | ICD-10-CM

## 2014-04-13 DIAGNOSIS — R10814 Left lower quadrant abdominal tenderness: Secondary | ICD-10-CM | POA: Diagnosis not present

## 2014-04-13 DIAGNOSIS — K469 Unspecified abdominal hernia without obstruction or gangrene: Secondary | ICD-10-CM | POA: Insufficient documentation

## 2014-04-13 DIAGNOSIS — M1990 Unspecified osteoarthritis, unspecified site: Secondary | ICD-10-CM | POA: Diagnosis not present

## 2014-04-13 DIAGNOSIS — R1033 Periumbilical pain: Secondary | ICD-10-CM | POA: Diagnosis not present

## 2014-04-13 DIAGNOSIS — K439 Ventral hernia without obstruction or gangrene: Secondary | ICD-10-CM | POA: Diagnosis not present

## 2014-04-13 DIAGNOSIS — Z9089 Acquired absence of other organs: Secondary | ICD-10-CM | POA: Insufficient documentation

## 2014-04-13 LAB — BASIC METABOLIC PANEL
Anion gap: 11 (ref 5–15)
BUN: 7 mg/dL (ref 6–23)
CALCIUM: 9.5 mg/dL (ref 8.4–10.5)
CO2: 28 mEq/L (ref 19–32)
Chloride: 100 mEq/L (ref 96–112)
Creatinine, Ser: 0.87 mg/dL (ref 0.50–1.10)
GFR calc non Af Amer: 66 mL/min — ABNORMAL LOW (ref >90)
GFR, EST AFRICAN AMERICAN: 77 mL/min — AB (ref >90)
Glucose, Bld: 97 mg/dL (ref 70–99)
POTASSIUM: 3.2 meq/L — AB (ref 3.7–5.3)
SODIUM: 139 meq/L (ref 137–147)

## 2014-04-13 LAB — CBC WITH DIFFERENTIAL/PLATELET
Basophils Absolute: 0 K/uL (ref 0.0–0.1)
Basophils Relative: 0 % (ref 0–1)
Eosinophils Absolute: 0.2 K/uL (ref 0.0–0.7)
Eosinophils Relative: 1 % (ref 0–5)
HCT: 38.1 % (ref 36.0–46.0)
Hemoglobin: 12.7 g/dL (ref 12.0–15.0)
Lymphocytes Relative: 26 % (ref 12–46)
Lymphs Abs: 2.9 K/uL (ref 0.7–4.0)
MCH: 31.8 pg (ref 26.0–34.0)
MCHC: 33.3 g/dL (ref 30.0–36.0)
MCV: 95.5 fL (ref 78.0–100.0)
Monocytes Absolute: 0.7 K/uL (ref 0.1–1.0)
Monocytes Relative: 6 % (ref 3–12)
Neutro Abs: 7.3 K/uL (ref 1.7–7.7)
Neutrophils Relative %: 67 % (ref 43–77)
Platelets: 254 K/uL (ref 150–400)
RBC: 3.99 MIL/uL (ref 3.87–5.11)
RDW: 13.1 % (ref 11.5–15.5)
WBC: 11 K/uL — ABNORMAL HIGH (ref 4.0–10.5)

## 2014-04-13 MED ORDER — IOHEXOL 300 MG/ML  SOLN
25.0000 mL | Freq: Once | INTRAMUSCULAR | Status: AC | PRN
  Administered 2014-04-13: 25 mL via ORAL

## 2014-04-13 MED ORDER — SODIUM CHLORIDE 0.9 % IV BOLUS (SEPSIS)
500.0000 mL | Freq: Once | INTRAVENOUS | Status: AC
  Administered 2014-04-13: 500 mL via INTRAVENOUS

## 2014-04-13 MED ORDER — IOHEXOL 300 MG/ML  SOLN
100.0000 mL | Freq: Once | INTRAMUSCULAR | Status: AC | PRN
  Administered 2014-04-13: 100 mL via INTRAVENOUS

## 2014-04-13 NOTE — ED Notes (Signed)
Per EMS: pt has abdominal hernia and reports it is getting bigger. Pt states it has been getting bigger over the past two weeks, denies any pain only discomfort in the abdomen. States she takes antiemesis medications to keep from throwing up. Denies any difficulties using the bathroom.

## 2014-04-13 NOTE — ED Provider Notes (Signed)
CSN: [id number]     Arrival date & time 04/13/14  2022 History  This chart was scribed for Maudry Diego, MD by Molli Posey, ED Scribe. This patient was seen in room [facility]06/[facility] and the patient's care was started 8:40 PM.      Chief Complaint  Patient presents with  . Hernia   Patient is a 68 y.o. female presenting with abdominal pain. The history is provided by the patient (pt complains of a hernia in her abdomen). No language interpreter was used.  Abdominal Pain Pain location:  Periumbilical Pain quality: not aching   Pain radiates to:  Does not radiate Pain severity:  Mild Onset quality:  Gradual Timing:  Constant Associated symptoms: no chest pain, no cough, no diarrhea, no fatigue and no hematuria    HPI Comments: [name] is a 68 y.o. female with a history of HTN and Crohn's disease who presents to the Emergency Department complaining that her hernia is getting bigger. She says it has been getting bigger over the past 2 weeks. Pt reports she had a incisional hernia repair last year by Dr. Arnoldo Morale. She denies any pain and states she only has discomfort in the abdomen. She reports she takes antiemesis medications to keep from vomiting. She denies any difficulties using the bathroom. She reports no modifying factors at this time.   PCP [facility]    Past Medical History  Diagnosis Date  . Crohn's disease   . Hypertension   . Stroke   . Arthritis   . Sciatica of right side   . Degenerative disc disease, lumbar   . Ventral hernia    Past Surgical History  Procedure Laterality Date  . Cholecystectomy      2008  . Carotid endarterectomy      2004  . Hemicolectomy      right  . Tubal ligation    . Incisional hernia repair N/A 05/20/2013    Procedure: [name] HERNIORRHAPHY WITH MESH;  Surgeon: Jamesetta So, MD;  Location: [facility];  Service: General;  Laterality: N/A;  . Insertion of mesh N/A 05/20/2013    Procedure: INSERTION OF MESH;  Surgeon: Jamesetta So, MD;   Location: [facility];  Service: General;  Laterality: N/A;   Family History  Problem Relation Age of Onset  . GI problems Father   . Stroke Brother   . Heart attack Mother   . Hypotension Sister    History  Substance Use Topics  . Smoking status: Current Every Day Smoker -- 1.00 packs/day for 30 years  . Smokeless tobacco: Not on file     Comment: 1-2 pack day x 20 yrs.  . Alcohol Use: No     Comment: one every 2 -3 months.   OB History   Grav Para Term Preterm Abortions TAB SAB Ect Mult Living                 Review of Systems  Constitutional: Negative for appetite change and fatigue.  HENT: Negative for congestion, ear discharge and sinus pressure.   Eyes: Negative for discharge.  Respiratory: Negative for cough.   Cardiovascular: Negative for chest pain.  Gastrointestinal: Negative for abdominal pain and diarrhea.  Genitourinary: Negative for frequency and hematuria.  Musculoskeletal: Negative for back pain.  Skin: Negative for rash.  Neurological: Negative for seizures and headaches.  Psychiatric/Behavioral: Negative for hallucinations.      Allergies  Flagyl  Home Medications   Prior to Admission medications  Medication Sig Start Date End Date Taking? Authorizing Provider  aspirin EC 81 MG tablet Take 81 mg by mouth daily.   Yes Historical Provider, MD  lisinopril (PRINIVIL,ZESTRIL) 20 MG tablet Take 10 mg by mouth daily.   Yes Historical Provider, MD  Mesalamine (DELZICOL) 400 MG CPDR DR capsule Take 800 mg by mouth 2 (two) times daily.   Yes Historical Provider, MD  ondansetron (ZOFRAN) 4 MG tablet Take 4 mg by mouth every 8 (eight) hours as needed for nausea or vomiting.    Yes Historical Provider, MD  acetaminophen (TYLENOL) 500 MG tablet Take 1,000 mg by mouth daily as needed for mild pain or moderate pain.    Historical Provider, MD   BP 121/74  Pulse 90  Temp(Src) 98.1 F (36.7 C) (Oral)  Resp 20  Ht 5\' 3"  (1.6 m)  Wt 124 lb (56.246 kg)  BMI 21.97  kg/m2  SpO2 100% Physical Exam  Nursing note and vitals reviewed. Constitutional: She is oriented to person, place, and time. She appears well-developed.  HENT:  Head: Normocephalic.  Eyes: Conjunctivae and EOM are normal. No scleral icterus.  Neck: Neck supple. No thyromegaly present.  Cardiovascular: Normal rate and regular rhythm.  Exam reveals no gallop and no friction rub.   No murmur heard. Pulmonary/Chest: No stridor. She has no wheezes. She has no rales. She exhibits no tenderness.  Abdominal: She exhibits no distension. There is tenderness. There is no rebound.  Moderate size periumbilical hernia with minimal tenderness   Musculoskeletal: Normal range of motion. She exhibits no edema.  Lymphadenopathy:    She has no cervical adenopathy.  Neurological: She is oriented to person, place, and time. She exhibits normal muscle tone. Coordination normal.  Skin: No rash noted. No erythema.  Psychiatric: She has a normal mood and affect. Her behavior is normal.    ED Course  Procedures  DIAGNOSTIC STUDIES: Oxygen Saturation is 100% on RA, normal by my interpretation.    COORDINATION OF CARE: 8:43 PM Discussed treatment plan with pt at bedside and pt agreed to plan.   Labs Review Labs Reviewed  CBC WITH DIFFERENTIAL - Abnormal; Notable for the following:    WBC 11.0 (*)    All other components within normal limits  BASIC METABOLIC PANEL - Abnormal; Notable for the following:    Potassium 3.2 (*)    GFR calc non Af Amer 66 (*)    GFR calc Af Amer 77 (*)    All other components within normal limits    Imaging Review No results found.   EKG Interpretation None      MDM   Final diagnoses:  None   No painm  Refer hernia to surgery The chart was scribed for me under my direct supervision.  I personally performed the history, physical, and medical decision making and all procedures in the evaluation of this patient.Maudry Diego, MD 04/14/14 [phone]

## 2014-04-14 DIAGNOSIS — I739 Peripheral vascular disease, unspecified: Secondary | ICD-10-CM | POA: Diagnosis not present

## 2014-04-14 DIAGNOSIS — K469 Unspecified abdominal hernia without obstruction or gangrene: Secondary | ICD-10-CM | POA: Diagnosis not present

## 2014-04-14 DIAGNOSIS — B351 Tinea unguium: Secondary | ICD-10-CM | POA: Diagnosis not present

## 2014-04-14 MED ORDER — ONDANSETRON 4 MG PO TBDP
4.0000 mg | ORAL_TABLET | Freq: Once | ORAL | Status: AC
  Administered 2014-04-14: 4 mg via ORAL
  Filled 2014-04-14: qty 1

## 2014-04-14 NOTE — Discharge Instructions (Signed)
Follow up with dr. Arnoldo Morale in 1 week.

## 2014-04-20 DIAGNOSIS — K432 Incisional hernia without obstruction or gangrene: Secondary | ICD-10-CM | POA: Diagnosis not present

## 2014-04-21 NOTE — H&P (Signed)
  NTS SOAP Note  Vital Signs:  Vitals as of: 81/01/2992: Systolic 716: Diastolic 76: Heart Rate 88: Temp 98.41F: Height 25f 2in: Weight 126Lbs 0 Ounces: BMI 23.05  BMI : 23.05 kg/m2  Subjective: This 68year old female presents for of an incisional hernia.  Noted by patient over the past few months.  CT scan of abdomen shows transverse colon within the hernia.  No emesis noted.  Mild abdominal wall pain.  Review of Symptoms:  Constitutional:unremarkable   Head:unremarkable Eyes:unremarkable   Nose/Mouth/Throat:unremarkable Cardiovascular:  unremarkable Respiratory:unremarkable Gastrointestinal:  unremarkable   Genitourinary:unremarkable   joint pain Skin:unremarkable Hematolgic/Lymphatic:unremarkable   Allergic/Immunologic:unremarkable   Past Medical History:  Reviewed  Past Medical History  Surgical History: cholecystectomy,  incisional herniorrhaphy with mesh 2014,  hemicolectomy,  BTL,  carotid endarterectomy Medical Problems: Crohn's disease,  HTN,  CVA,  arthritis Allergies: flagyl Medications: baby asa,  lisinopril,  delzicol,  zofran,  tylenol   Social History:Reviewed  Social History  Preferred Language: English Race:  White Ethnicity: Not Hispanic / Latino Age: [age]year Marital Status:  W Alcohol: no   Smoking Status: Current every day smoker reviewed on 04/20/2014 Started Date:  Packs per day: 1.00 Functional Status reviewed on 04/20/2014 ------------------------------------------------ Bathing: Normal Cooking: Normal Dressing: Normal Driving: Normal Eating: Normal Managing Meds: Normal Oral Care: Normal Shopping: Normal Toileting: Normal Transferring: Normal Walking: Disablilty - uses a cane Cognitive Status reviewed on 04/20/2014 ------------------------------------------------ Attention: Normal Decision Making: Normal Language: Normal Memory: Normal Motor: Normal Perception: Normal Problem Solving: Normal Visual and  Spatial: Normal    Family Health History Family History is Unknown    Objective Information: General:Well appearing, well nourished in no distress. Neck:Supple without lymphadenopathy.  Heart:RRR, no murmur or gallop.  Normal S1, S2.  No S3, S4.  Lungs:  CTA bilaterally, no wheezes, rhonchi, rales.  Breathing unlabored. Abdomen:Soft, NT/ND, no HSM, no masses.  Incisional hernia noted supraumbilically.  Seems to have occurred superior to the previously placed mesh.  Easily reducible.  Assessment:Reucurrent incisional hernia  Diagnoses: 553.21  K43.2 Incisional hernia (Incisional hernia without obstruction or gangrene)  Procedures: 996789- OFFICE OUTPATIENT VISIT 25 MINUTES    Plan:  Scheduled for recurrent incisional herniorrhaphy with mesh on 05/19/14.   Patient Education:Alternative treatments to surgery were discussed with patient (and family).  Risks and benefits  of procedure including bleeding,  infection,  cardiopulmonary difficulties,  and the possibility of recurrence of the hernia were fully explained to the patient (and family) who gave informed consent. Patient/family questions were addressed.  Follow-up:Pending Surgery

## 2014-05-04 ENCOUNTER — Encounter (INDEPENDENT_AMBULATORY_CARE_PROVIDER_SITE_OTHER): Payer: Self-pay | Admitting: *Deleted

## 2014-05-09 NOTE — Patient Instructions (Signed)
[name]  05/09/2014   Your procedure is scheduled on:  05/19/2014  Report to [facility] at  71  AM.  Call this number if you have problems the morning of surgery: [phone]   Remember:   Do not eat food or drink liquids after midnight.   Take these medicines the morning of surgery with A SIP OF WATER:  Lisinopril, mesalamine, zofran   Do not wear jewelry, make-up or nail polish.  Do not wear lotions, powders, or perfumes.  Do not shave 48 hours prior to surgery. Men may shave face and neck.  Do not bring valuables to the hospital.  [facility] [place] is not responsible for any belongings or valuables.               Contacts, dentures or bridgework may not be worn into surgery.  Leave suitcase in the car. After surgery it may be brought to your room.  For patients admitted to the hospital, discharge time is determined by your treatment team.               Patients discharged the day of surgery will not be allowed to drive home.  Name and phone number of your driver: family  Special Instructions: Shower using CHG 2 nights before surgery and the night before surgery.  If you shower the day of surgery use CHG.  Use special wash - you have one bottle of CHG for all showers.  You should use approximately 1/3 of the bottle for each shower.   Please read over the following fact sheets that you were given: Pain Booklet, Coughing and Deep Breathing, Surgical Site Infection Prevention, Anesthesia Post-op Instructions and Care and Recovery After Surgery Hernia A hernia occurs when an internal organ pushes out through a weak spot in the abdominal wall. Hernias most commonly occur in the groin and around the navel. Hernias often can be pushed back into place (reduced). Most hernias tend to get worse over time. Some abdominal hernias can get stuck in the opening (irreducible or incarcerated hernia) and cannot be reduced. An irreducible abdominal hernia which is tightly squeezed into the opening  is at risk for impaired blood supply (strangulated hernia). A strangulated hernia is a medical emergency. Because of the risk for an irreducible or strangulated hernia, surgery may be recommended to repair a hernia. CAUSES   Heavy lifting.  Prolonged coughing.  Straining to have a bowel movement.  A cut (incision) made during an abdominal surgery. HOME CARE INSTRUCTIONS   Bed rest is not required. You may continue your normal activities.  Avoid lifting more than 10 pounds (4.5 kg) or straining.  Cough gently. If you are a smoker it is best to stop. Even the best hernia repair can break down with the continual strain of coughing. Even if you do not have your hernia repaired, a cough will continue to aggravate the problem.  Do not wear anything tight over your hernia. Do not try to keep it in with an outside bandage or truss. These can damage abdominal contents if they are trapped within the hernia sac.  Eat a normal diet.  Avoid constipation. Straining over long periods of time will increase hernia size and encourage breakdown of repairs. If you cannot do this with diet alone, stool softeners may be used. SEEK IMMEDIATE MEDICAL CARE IF:   You have a fever.  You develop increasing abdominal pain.  You feel nauseous or vomit.  Your hernia is  stuck outside the abdomen, looks discolored, feels hard, or is tender.  You have any changes in your bowel habits or in the hernia that are unusual for you.  You have increased pain or swelling around the hernia.  You cannot push the hernia back in place by applying gentle pressure while lying down. MAKE SURE YOU:   Understand these instructions.  Will watch your condition.  Will get help right away if you are not doing well or get worse. Document Released: 06/02/2005 Document Revised: 08/25/2011 Document Reviewed: 01/20/2008 [facility] Patient Information 2015 [place], [place]. This information is not intended to replace advice given to  you by your health care provider. Make sure you discuss any questions you have with your health care provider. PATIENT INSTRUCTIONS POST-ANESTHESIA  IMMEDIATELY FOLLOWING SURGERY:  Do not drive or operate machinery for the first twenty four hours after surgery.  Do not make any important decisions for twenty four hours after surgery or while taking narcotic pain medications or sedatives.  If you develop intractable nausea and vomiting or a severe headache please notify your doctor immediately.  FOLLOW-UP:  Please make an appointment with your surgeon as instructed. You do not need to follow up with anesthesia unless specifically instructed to do so.  WOUND CARE INSTRUCTIONS (if applicable):  Keep a dry clean dressing on the anesthesia/puncture wound site if there is drainage.  Once the wound has quit draining you may leave it open to air.  Generally you should leave the bandage intact for twenty four hours unless there is drainage.  If the epidural site drains for more than 36-48 hours please call the anesthesia department.  QUESTIONS?:  Please feel free to call your physician or the hospital operator if you have any questions, and they will be happy to assist you.

## 2014-05-15 ENCOUNTER — Encounter (HOSPITAL_COMMUNITY): Payer: Self-pay

## 2014-05-15 ENCOUNTER — Encounter (HOSPITAL_COMMUNITY)
Admission: RE | Admit: 2014-05-15 | Discharge: 2014-05-15 | Disposition: A | Discharge status: Home / Self Care | Payer: Medicare Other | Source: Ambulatory Visit | Attending: General Surgery | Admitting: General Surgery

## 2014-05-15 DIAGNOSIS — Z01812 Encounter for preprocedural laboratory examination: Secondary | ICD-10-CM | POA: Diagnosis not present

## 2014-05-15 LAB — CBC WITH DIFFERENTIAL/PLATELET
BASOS PCT: 0 % (ref 0–1)
Basophils Absolute: 0 10*3/uL (ref 0.0–0.1)
EOS ABS: 0.1 10*3/uL (ref 0.0–0.7)
Eosinophils Relative: 1 % (ref 0–5)
HCT: 38.8 % (ref 36.0–46.0)
HEMOGLOBIN: 12.9 g/dL (ref 12.0–15.0)
Lymphocytes Relative: 20 % (ref 12–46)
Lymphs Abs: 1.8 10*3/uL (ref 0.7–4.0)
MCH: 32.5 pg (ref 26.0–34.0)
MCHC: 33.2 g/dL (ref 30.0–36.0)
MCV: 97.7 fL (ref 78.0–100.0)
MONO ABS: 0.4 10*3/uL (ref 0.1–1.0)
MONOS PCT: 5 % (ref 3–12)
Neutro Abs: 6.9 10*3/uL (ref 1.7–7.7)
Neutrophils Relative %: 75 % (ref 43–77)
Platelets: 214 10*3/uL (ref 150–400)
RBC: 3.97 MIL/uL (ref 3.87–5.11)
RDW: 12.8 % (ref 11.5–15.5)
WBC: 9.2 10*3/uL (ref 4.0–10.5)

## 2014-05-15 LAB — BASIC METABOLIC PANEL
Anion gap: 13 (ref 5–15)
BUN: 5 mg/dL — AB (ref 6–23)
CHLORIDE: 102 meq/L (ref 96–112)
CO2: 26 meq/L (ref 19–32)
CREATININE: 0.79 mg/dL (ref 0.50–1.10)
Calcium: 9.5 mg/dL (ref 8.4–10.5)
GFR calc Af Amer: >90 mL/min (ref >90)
GFR calc non Af Amer: 83 mL/min — ABNORMAL LOW (ref >90)
GLUCOSE: 138 mg/dL — AB (ref 70–99)
Potassium: 3.8 mEq/L (ref 3.7–5.3)
Sodium: 141 mEq/L (ref 137–147)

## 2014-05-19 ENCOUNTER — Ambulatory Visit (HOSPITAL_COMMUNITY): Payer: Medicare Other | Admitting: Anesthesiology

## 2014-05-19 ENCOUNTER — Ambulatory Visit (HOSPITAL_COMMUNITY)
Admission: RE | Admit: 2014-05-19 | Discharge: 2014-05-19 | Disposition: A | Discharge status: Home / Self Care | Payer: Medicare Other | Source: Ambulatory Visit | Attending: General Surgery | Admitting: General Surgery

## 2014-05-19 ENCOUNTER — Encounter (HOSPITAL_COMMUNITY)
Admission: RE | Disposition: A | Discharge status: Home / Self Care | Payer: Self-pay | Source: Ambulatory Visit | Attending: General Surgery

## 2014-05-19 ENCOUNTER — Encounter (HOSPITAL_COMMUNITY): Payer: Self-pay

## 2014-05-19 DIAGNOSIS — K432 Incisional hernia without obstruction or gangrene: Secondary | ICD-10-CM | POA: Insufficient documentation

## 2014-05-19 DIAGNOSIS — F17210 Nicotine dependence, cigarettes, uncomplicated: Secondary | ICD-10-CM | POA: Insufficient documentation

## 2014-05-19 HISTORY — PX: INSERTION OF MESH: SHX5868

## 2014-05-19 HISTORY — PX: INCISIONAL HERNIA REPAIR: SHX193

## 2014-05-19 SURGERY — REPAIR, HERNIA, INCISIONAL
Anesthesia: General | Site: Abdomen

## 2014-05-19 MED ORDER — PROPOFOL 10 MG/ML IV EMUL
INTRAVENOUS | Status: AC
  Filled 2014-05-19: qty 20

## 2014-05-19 MED ORDER — ONDANSETRON HCL 4 MG/2ML IJ SOLN
4.0000 mg | Freq: Once | INTRAMUSCULAR | Status: AC
  Administered 2014-05-19: 4 mg via INTRAVENOUS

## 2014-05-19 MED ORDER — CHLORHEXIDINE GLUCONATE 4 % EX LIQD: 1.0000 "application " | Freq: Once | CUTANEOUS | Status: DC

## 2014-05-19 MED ORDER — KETOROLAC TROMETHAMINE 30 MG/ML IJ SOLN
INTRAMUSCULAR | Status: AC
Start: 2014-05-19 — End: 2014-05-19
  Filled 2014-05-19: qty 1

## 2014-05-19 MED ORDER — NEOSTIGMINE METHYLSULFATE 10 MG/10ML IV SOLN
INTRAVENOUS | Status: AC
  Filled 2014-05-19: qty 1

## 2014-05-19 MED ORDER — DEXAMETHASONE SODIUM PHOSPHATE 4 MG/ML IJ SOLN
INTRAMUSCULAR | Status: AC
  Filled 2014-05-19: qty 1

## 2014-05-19 MED ORDER — ONDANSETRON HCL 4 MG/2ML IJ SOLN: 4.0000 mg | Freq: Once | INTRAMUSCULAR | Status: DC | PRN

## 2014-05-19 MED ORDER — FENTANYL CITRATE 0.05 MG/ML IJ SOLN
25.0000 ug | INTRAMUSCULAR | Status: DC | PRN
  Administered 2014-05-19 (×2): 50 ug via INTRAVENOUS

## 2014-05-19 MED ORDER — SODIUM CHLORIDE 0.9 % IR SOLN
Status: DC | PRN
  Administered 2014-05-19: 500 mL

## 2014-05-19 MED ORDER — CEFAZOLIN SODIUM-DEXTROSE 2-3 GM-% IV SOLR
INTRAVENOUS | Status: AC
  Filled 2014-05-19: qty 50

## 2014-05-19 MED ORDER — HYDROCODONE-ACETAMINOPHEN 5-325 MG PO TABS: 1.0000 | ORAL_TABLET | ORAL | Status: DC | PRN

## 2014-05-19 MED ORDER — DEXAMETHASONE SODIUM PHOSPHATE 4 MG/ML IJ SOLN
4.0000 mg | Freq: Once | INTRAMUSCULAR | Status: AC
  Administered 2014-05-19: 4 mg via INTRAVENOUS

## 2014-05-19 MED ORDER — MIDAZOLAM HCL 2 MG/2ML IJ SOLN
INTRAMUSCULAR | Status: AC
  Filled 2014-05-19: qty 2

## 2014-05-19 MED ORDER — POVIDONE-IODINE 10 % EX OINT
TOPICAL_OINTMENT | CUTANEOUS | Status: AC
  Filled 2014-05-19: qty 1

## 2014-05-19 MED ORDER — FENTANYL CITRATE 0.05 MG/ML IJ SOLN
INTRAMUSCULAR | Status: AC
  Filled 2014-05-19: qty 2

## 2014-05-19 MED ORDER — ROCURONIUM BROMIDE 50 MG/5ML IV SOLN
INTRAVENOUS | Status: AC
  Filled 2014-05-19: qty 1

## 2014-05-19 MED ORDER — POVIDONE-IODINE 10 % OINT PACKET
TOPICAL_OINTMENT | CUTANEOUS | Status: DC | PRN
  Administered 2014-05-19: 1 via TOPICAL

## 2014-05-19 MED ORDER — KETOROLAC TROMETHAMINE 30 MG/ML IJ SOLN
30.0000 mg | Freq: Once | INTRAMUSCULAR | Status: AC
  Administered 2014-05-19: 30 mg via INTRAVENOUS

## 2014-05-19 MED ORDER — ONDANSETRON HCL 4 MG/2ML IJ SOLN
INTRAMUSCULAR | Status: AC
  Filled 2014-05-19: qty 2

## 2014-05-19 MED ORDER — SODIUM CHLORIDE 0.9 % IJ SOLN
INTRAMUSCULAR | Status: AC
  Filled 2014-05-19: qty 10

## 2014-05-19 MED ORDER — MIDAZOLAM HCL 2 MG/2ML IJ SOLN
1.0000 mg | INTRAMUSCULAR | Status: DC | PRN
  Administered 2014-05-19: 2 mg via INTRAVENOUS

## 2014-05-19 MED ORDER — LACTATED RINGERS IV SOLN
INTRAVENOUS | Status: DC
  Administered 2014-05-19: 07:00:00 via INTRAVENOUS

## 2014-05-19 MED ORDER — FENTANYL CITRATE 0.05 MG/ML IJ SOLN
INTRAMUSCULAR | Status: AC
  Filled 2014-05-19: qty 5

## 2014-05-19 MED ORDER — BUPIVACAINE HCL (PF) 0.5 % IJ SOLN
INTRAMUSCULAR | Status: AC
  Filled 2014-05-19: qty 30

## 2014-05-19 MED ORDER — LIDOCAINE HCL (PF) 1 % IJ SOLN
INTRAMUSCULAR | Status: AC
  Filled 2014-05-19: qty 5

## 2014-05-19 MED ORDER — CEFAZOLIN SODIUM-DEXTROSE 2-3 GM-% IV SOLR: 2.0000 g | INTRAVENOUS | Status: DC

## 2014-05-19 MED ORDER — GLYCOPYRROLATE 0.2 MG/ML IJ SOLN
INTRAMUSCULAR | Status: AC
Start: 2014-05-19 — End: 2014-05-19
  Filled 2014-05-19: qty 2

## 2014-05-19 SURGICAL SUPPLY — 40 items
BAG HAMPER (MISCELLANEOUS) ×4 IMPLANT
CHLORAPREP W/TINT 26ML (MISCELLANEOUS) ×4 IMPLANT
CLOTH BEACON ORANGE TIMEOUT ST (SAFETY) ×4 IMPLANT
COVER LIGHT HANDLE STERIS (MISCELLANEOUS) ×8 IMPLANT
DECANTER SPIKE VIAL GLASS SM (MISCELLANEOUS) IMPLANT
ELECT REM PT RETURN 9FT ADLT (ELECTROSURGICAL) ×4
ELECTRODE REM PT RTRN 9FT ADLT (ELECTROSURGICAL) ×2 IMPLANT
FORMALIN 10 PREFIL 480ML (MISCELLANEOUS) ×2 IMPLANT
GAUZE SPONGE 4X4 12PLY STRL (GAUZE/BANDAGES/DRESSINGS) ×4 IMPLANT
GLOVE SURG SS PI 7.5 STRL IVOR (GLOVE) ×4 IMPLANT
GOWN STRL REUS W/ TWL XL LVL3 (GOWN DISPOSABLE) ×2 IMPLANT
GOWN STRL REUS W/TWL LRG LVL3 (GOWN DISPOSABLE) ×8 IMPLANT
GOWN STRL REUS W/TWL XL LVL3 (GOWN DISPOSABLE) ×12
INST SET MAJOR GENERAL (KITS) ×4 IMPLANT
KIT ROOM TURNOVER APOR (KITS) ×4 IMPLANT
MANIFOLD NEPTUNE II (INSTRUMENTS) ×4 IMPLANT
MESH VENTRALEX ST 8CM LRG (Mesh General) ×2 IMPLANT
NDL HYPO 25X1 1.5 SAFETY (NEEDLE) ×2 IMPLANT
NEEDLE HYPO 25X1 1.5 SAFETY (NEEDLE) ×4 IMPLANT
NS IRRIG 1000ML POUR BTL (IV SOLUTION) ×4 IMPLANT
PACK ABDOMINAL MAJOR (CUSTOM PROCEDURE TRAY) ×4 IMPLANT
PAD ARMBOARD 7.5X6 YLW CONV (MISCELLANEOUS) ×4 IMPLANT
SET BASIN LINEN APH (SET/KITS/TRAYS/PACK) ×4 IMPLANT
SPONGE GAUZE 4X4 12PLY (GAUZE/BANDAGES/DRESSINGS) ×1 IMPLANT
STAPLER VISISTAT (STAPLE) ×2 IMPLANT
SUT ETHIBOND NAB MO 7 #0 18IN (SUTURE) ×2 IMPLANT
SUT NOVA NAB GS-21 1 T12 (SUTURE) IMPLANT
SUT NOVA NAB GS-22 2 2-0 T-19 (SUTURE) IMPLANT
SUT NOVA NAB GS-26 0 60 (SUTURE) IMPLANT
SUT PROLENE 0 CT 1 CR/8 (SUTURE) IMPLANT
SUT SILK 2 0 (SUTURE)
SUT SILK 2-0 18XBRD TIE 12 (SUTURE) IMPLANT
SUT VIC AB 2-0 CT1 27 (SUTURE) ×4
SUT VIC AB 2-0 CT1 TAPERPNT 27 (SUTURE) ×2 IMPLANT
SUT VIC AB 3-0 SH 27 (SUTURE) ×4
SUT VIC AB 3-0 SH 27X BRD (SUTURE) ×2 IMPLANT
SUT VIC AB 4-0 PS2 27 (SUTURE) IMPLANT
SYR 20CC LL (SYRINGE) ×2 IMPLANT
SYR CONTROL 10ML LL (SYRINGE) ×2 IMPLANT
TAPE CLOTH SURG 4X10 WHT LF (GAUZE/BANDAGES/DRESSINGS) ×2 IMPLANT

## 2014-05-19 NOTE — Anesthesia Postprocedure Evaluation (Signed)
  Anesthesia Post-op Note  Patient: [name]  Procedure(s) Performed: Procedure(s): RECURRENT HERNIA REPAIR INCISIONAL WITH MESH (N/A)  Patient Location: PACU  Anesthesia Type:General  Level of Consciousness: awake, alert  and oriented  Airway and Oxygen Therapy: Patient Spontanous Breathing  Post-op Pain: mild  Post-op Assessment: Post-op Vital signs reviewed, Patient's Cardiovascular Status Stable, Respiratory Function Stable, Patent Airway and No signs of Nausea or vomiting  Post-op Vital Signs: Reviewed and stable  Last Vitals:  Filed Vitals:   05/19/14 0915  BP: 111/74  Pulse: 73  Temp:   Resp: 13    Complications: No apparent anesthesia complications

## 2014-05-19 NOTE — Discharge Instructions (Signed)
Open Hernia Repair, Care After Refer to this sheet in the next few weeks. These instructions provide you with information on caring for yourself after your procedure. Your health care provider may also give you more specific instructions. Your treatment has been planned according to current medical practices, but problems sometimes occur. Call your health care provider if you have any problems or questions after your procedure. WHAT TO EXPECT AFTER THE PROCEDURE After your procedure, it is typical to have the following:  Pain in your abdomen, especially along your incision. You will be given pain medicines to control the pain.  Constipation. You may be given a stool softener to help prevent this. HOME CARE INSTRUCTIONS   Only take over-the-counter or prescription medicines as directed by your health care provider.  Keep the wound dry and clean. You may wash the wound gently with soap and water 48 hours after surgery. Gently blot or dab the wound dry. Do not take baths, use swimming pools, or use hot tubs for 10 days or until your health care provider approves.  Change bandages (dressings) as directed by your health care provider.  Continue your normal diet as directed by your health care provider. Eat plenty of fruits and vegetables to help prevent constipation.  Drink enough fluids to keep your urine clear or pale yellow. This also helps prevent constipation.  Do not drive until your health care provider says it is okay.  Do not lift anything heavier than 10 pounds (4.5 kg) or play contact sports for 4 weeks or until your health care provider approves.  Follow up with your health care provider as directed. Ask your health care provider when to make an appointment to have your stitches (sutures) or staples removed. SEEK MEDICAL CARE IF:   You have increased bleeding coming from the incision site.  You have blood in your stool.  You have increasing pain in the wound.  You see redness  or swelling in the wound.  You have fluid (pus) coming from the wound.  You have a fever.  You notice a bad smell coming from the wound or dressing. SEEK IMMEDIATE MEDICAL CARE IF:   You develop a rash.  You have chest pain or shortness of breath.  You feel lightheaded or feel faint. Document Released: 12/20/2004 Document Revised: 03/23/2013 Document Reviewed: 01/12/2013 [facility] Patient Information 2015 [place], [place]. This information is not intended to replace advice given to you by your health care provider. Make sure you discuss any questions you have with your health care provider.

## 2014-05-19 NOTE — Anesthesia Preprocedure Evaluation (Signed)
Anesthesia Evaluation  Patient identified by MRN, date of birth, ID band Patient awake    Reviewed: Allergy & Precautions, H&P , NPO status , Patient's Chart, lab work & pertinent test results  Airway Mallampati: II  TM Distance: >3 FB     Dental  (+) Edentulous Upper, Edentulous Lower   Pulmonary Current Smoker,  breath sounds clear to auscultation        Cardiovascular hypertension, Pt. on medications Rhythm:Regular Rate:Normal     Neuro/Psych  Neuromuscular disease (L side weakness) CVA, Residual Symptoms    GI/Hepatic SBO, nauseated    Endo/Other    Renal/GU      Musculoskeletal  (+) Arthritis -,   Abdominal   Peds  Hematology   Anesthesia Other Findings   Reproductive/Obstetrics                             Anesthesia Physical Anesthesia Plan  ASA: III  Anesthesia Plan: General   Post-op Pain Management:    Induction: Intravenous  Airway Management Planned: Oral ETT  Additional Equipment:   Intra-op Plan:   Post-operative Plan: Extubation in OR  Informed Consent: I have reviewed the patients History and Physical, chart, labs and discussed the procedure including the risks, benefits and alternatives for the proposed anesthesia with the patient or authorized representative who has indicated his/her understanding and acceptance.     Plan Discussed with:   Anesthesia Plan Comments:         Anesthesia Quick Evaluation

## 2014-05-19 NOTE — Interval H&P Note (Signed)
History and Physical Interval Note:  05/19/2014 7:25 AM  [name]  has presented today for surgery, with the diagnosis of recurrent incisional hernia  The various methods of treatment have been discussed with the patient and family. After consideration of risks, benefits and other options for treatment, the patient has consented to  Procedure(s): RECURRENT HERNIA REPAIR INCISIONAL WITH MESH (N/A) as a surgical intervention .  The patient's history has been reviewed, patient examined, no change in status, stable for surgery.  I have reviewed the patient's chart and labs.  Questions were answered to the patient's satisfaction.     Aviva Signs A

## 2014-05-19 NOTE — OR Nursing (Signed)
Computers down, wound has 4x4 and tape, no drainage, dry and intact, pt in no distress

## 2014-05-19 NOTE — Transfer of Care (Signed)
Immediate Anesthesia Transfer of Care Note  Patient: [name]  Procedure(s) Performed: Procedure(s): RECURRENT HERNIA REPAIR INCISIONAL WITH MESH (N/A)  Patient Location: PACU  Anesthesia Type:General  Level of Consciousness: awake, alert  and oriented  Airway & Oxygen Therapy: Patient Spontanous Breathing and Patient connected to face mask oxygen  Post-op Assessment: Report given to PACU RN  Post vital signs: Reviewed and stable  Complications: No apparent anesthesia complications

## 2014-05-19 NOTE — Op Note (Signed)
Patient:  [name]  DOB:  1946/06/08  MRN:  [id number]   Preop Diagnosis:  Recurrent incisional hernia  Postop Diagnosis:  Same  Procedure:  Recurrent incisional herniorrhaphy with mesh  Surgeon:  Aviva Signs, M.D.  Anes:  Gen. endotracheal  Indications:  Patient is a 68 year old white female who presents with a recurrent incisional hernia superior to the previous incisional herniorrhaphy with mesh. Risks and benefits of the procedure including bleeding, infection, and recurrence of the hernia were fully explained to the patient, who gave informed consent.  Procedure note:  The patient was placed the supine position. After induction of general endotracheal anesthesia, the abdomen was prepped and draped using usual sterile technique with ChloraPrep. Surgical site confirmation was performed.  A midline incision was made along the superior aspect of the previous incision line. This was extended superiorly. The dissection was taken down to the hernia defect. The peritoneal cavity was entered into without difficulty. The defect measured approximately 4 cm in its greatest diameter. Adhesions from the bowel to the abdominal wall as well as the previously placed mesh were lysed sharply without difficulty. This allowed me to place an 8 cm ventralex ST patch along the abdominal wall. It was secured to the abdominal wall using 0 Ethibond interrupted sutures. The overlying fascia was then reapproximated over this mesh repair in a transverse fashion using 0 Ethibond interrupted sutures. The subcutaneous layer was reapproximated using 20 and 3-0 Vicryl interrupted sutures. The skin was closed using staples. 0.5% Sensorcaine was instilled the surrounding wound. Betadine ointment and dry sterile dressings were applied.  All tape and needle counts were correct at the end of the procedure. The patient was extubated in the operating room and transferred to PACU in stable condition.  Complications:   None  EBL:  Minimal  Specimen:  None

## 2014-05-22 ENCOUNTER — Encounter (HOSPITAL_COMMUNITY): Payer: Self-pay | Admitting: General Surgery

## 2014-05-23 ENCOUNTER — Encounter (HOSPITAL_COMMUNITY): Payer: Self-pay | Admitting: General Surgery

## 2014-05-30 ENCOUNTER — Encounter (HOSPITAL_COMMUNITY): Payer: Self-pay | Admitting: General Surgery

## 2014-07-11 ENCOUNTER — Other Ambulatory Visit (INDEPENDENT_AMBULATORY_CARE_PROVIDER_SITE_OTHER): Payer: Self-pay | Admitting: Internal Medicine

## 2014-08-14 DIAGNOSIS — I10 Essential (primary) hypertension: Secondary | ICD-10-CM | POA: Diagnosis not present

## 2014-08-14 DIAGNOSIS — M1991 Primary osteoarthritis, unspecified site: Secondary | ICD-10-CM | POA: Diagnosis not present

## 2014-08-14 DIAGNOSIS — Z6823 Body mass index (BMI) 23.0-23.9, adult: Secondary | ICD-10-CM | POA: Diagnosis not present

## 2014-08-14 DIAGNOSIS — J0100 Acute maxillary sinusitis, unspecified: Secondary | ICD-10-CM | POA: Diagnosis not present

## 2014-08-14 DIAGNOSIS — J209 Acute bronchitis, unspecified: Secondary | ICD-10-CM | POA: Diagnosis not present

## 2014-08-22 ENCOUNTER — Encounter (INDEPENDENT_AMBULATORY_CARE_PROVIDER_SITE_OTHER): Payer: Self-pay | Admitting: Internal Medicine

## 2014-08-22 ENCOUNTER — Ambulatory Visit (INDEPENDENT_AMBULATORY_CARE_PROVIDER_SITE_OTHER): Payer: Medicare Other | Admitting: Internal Medicine

## 2014-08-22 VITALS — BP 128/80 | HR 78 | Temp 97.5°F | Resp 18 | Ht 63.0 in | Wt 130.3 lb

## 2014-08-22 DIAGNOSIS — K5000 Crohn's disease of small intestine without complications: Secondary | ICD-10-CM

## 2014-08-22 NOTE — Patient Instructions (Signed)
Can use Imodium OTC 2 mg once or twice daily as needed for diarrhea. Call if you have rectal bleeding abdominal pain.

## 2014-08-22 NOTE — Progress Notes (Signed)
Presenting complaint;  Follow-up for small bowel Crohn's disease.  Subjective:  Patient is 69 year old Caucasian female who has history of small bowel Crohn's disease and is here for scheduled visit accompanied by her daughter. She was last seen in January 2015. She has no complaints. She remains with 3-4 bowel movements per day. She was recently treated with antibiotic for bronchitis and has noted loose stools but prior to that she has noted formed stools in addition to some are formed or loose stools. She denies abdominal pain fever nausea or vomiting. She also denies heartburn or dysphagia. She states she walks for 1 hour every day. She continues to smoke cigarettes. She has not lost any weight since her last visit. She remains on low-dose aspirin. She does not take OTC NSAIDs and uses ondansetron occasionally.  Current Medications: Outpatient Encounter Prescriptions as of 08/22/2014  Medication Sig  . acetaminophen (TYLENOL) 500 MG tablet Take 1,000 mg by mouth daily as needed for mild pain or moderate pain.  Marland Kitchen aspirin EC 81 MG tablet Take 81 mg by mouth daily.  . DELZICOL 400 MG CPDR DR capsule TAKE 2 CAPSULES BY MOUTH TWICE DAILY.  . diphenhydrAMINE (BENADRYL) 25 mg capsule Take 25 mg by mouth every 6 (six) hours as needed for itching.  Marland Kitchen lisinopril (PRINIVIL,ZESTRIL) 20 MG tablet Take 10 mg by mouth daily.  . ondansetron (ZOFRAN) 4 MG tablet Take 4 mg by mouth every 8 (eight) hours as needed for nausea or vomiting.   . [DISCONTINUED] HYDROcodone-acetaminophen (NORCO/VICODIN) 5-325 MG per tablet Take 1 tablet by mouth every 4 (four) hours as needed for moderate pain. (Patient not taking: Reported on 08/22/2014)    Objective: Blood pressure 128/80, pulse 78, temperature 97.5 F (36.4 C), temperature source Oral, resp. rate 18, height 5' 3"  (1.6 m), weight 130 lb 4.8 oz (59.104 kg). Patient is alert and in no acute distress. Conjunctiva is pink. Sclera is nonicteric Oropharyngeal mucosa  is normal. No neck masses or thyromegaly noted. Cardiac exam with regular rhythm normal S1 and S2. No murmur or gallop noted. Lungs are clear to auscultation. Abdomen;  No LE edema or clubbing noted.  Labs/studies Results: CBC from 05/15/2014  WBC 9.2, H&H 12.9 and 38.8 and platelet count 214K    Assessment:  #1. History of small bowel Crohn's disease. She remains in remission on oral mesalamine. She is status post right hemicolectomy in 2008 when she presented with intra-abdominal abscess. #2. Patient is average risk for CRC. She is not interested in any form of screening.   Plan:  Continue Delzicol 800 mg by mouth twice a day. Call for diarrhea and rectal bleeding. Office visit in 1 year.

## 2014-10-09 ENCOUNTER — Other Ambulatory Visit (INDEPENDENT_AMBULATORY_CARE_PROVIDER_SITE_OTHER): Payer: Self-pay | Admitting: Internal Medicine

## 2014-10-10 DIAGNOSIS — I739 Peripheral vascular disease, unspecified: Secondary | ICD-10-CM | POA: Diagnosis not present

## 2014-10-10 DIAGNOSIS — B351 Tinea unguium: Secondary | ICD-10-CM | POA: Diagnosis not present

## 2014-11-27 DIAGNOSIS — Z6824 Body mass index (BMI) 24.0-24.9, adult: Secondary | ICD-10-CM | POA: Diagnosis not present

## 2014-11-27 DIAGNOSIS — L723 Sebaceous cyst: Secondary | ICD-10-CM | POA: Diagnosis not present

## 2014-12-19 DIAGNOSIS — I739 Peripheral vascular disease, unspecified: Secondary | ICD-10-CM | POA: Diagnosis not present

## 2014-12-19 DIAGNOSIS — B351 Tinea unguium: Secondary | ICD-10-CM | POA: Diagnosis not present

## 2014-12-25 DIAGNOSIS — Z1389 Encounter for screening for other disorder: Secondary | ICD-10-CM | POA: Diagnosis not present

## 2014-12-25 DIAGNOSIS — Z6824 Body mass index (BMI) 24.0-24.9, adult: Secondary | ICD-10-CM | POA: Diagnosis not present

## 2014-12-25 DIAGNOSIS — L980 Pyogenic granuloma: Secondary | ICD-10-CM | POA: Diagnosis not present

## 2015-01-02 DIAGNOSIS — D492 Neoplasm of unspecified behavior of bone, soft tissue, and skin: Secondary | ICD-10-CM | POA: Diagnosis not present

## 2015-01-05 ENCOUNTER — Other Ambulatory Visit (INDEPENDENT_AMBULATORY_CARE_PROVIDER_SITE_OTHER): Payer: Self-pay | Admitting: Internal Medicine

## 2015-01-18 DIAGNOSIS — L570 Actinic keratosis: Secondary | ICD-10-CM | POA: Diagnosis not present

## 2015-01-18 DIAGNOSIS — C44722 Squamous cell carcinoma of skin of right lower limb, including hip: Secondary | ICD-10-CM | POA: Diagnosis not present

## 2015-01-18 DIAGNOSIS — X32XXXA Exposure to sunlight, initial encounter: Secondary | ICD-10-CM | POA: Diagnosis not present

## 2015-02-21 ENCOUNTER — Inpatient Hospital Stay (HOSPITAL_COMMUNITY)
Admission: EM | Admit: 2015-02-21 | Discharge: 2015-02-25 | DRG: 388 | Disposition: A | Discharge status: Home / Self Care | Payer: Medicare Other | Attending: Internal Medicine | Admitting: Internal Medicine

## 2015-02-21 ENCOUNTER — Observation Stay (HOSPITAL_COMMUNITY): Payer: Medicare Other

## 2015-02-21 ENCOUNTER — Encounter (HOSPITAL_COMMUNITY): Payer: Self-pay | Admitting: *Deleted

## 2015-02-21 ENCOUNTER — Emergency Department (HOSPITAL_COMMUNITY): Payer: Medicare Other

## 2015-02-21 DIAGNOSIS — K5090 Crohn's disease, unspecified, without complications: Secondary | ICD-10-CM | POA: Diagnosis present

## 2015-02-21 DIAGNOSIS — N390 Urinary tract infection, site not specified: Secondary | ICD-10-CM | POA: Diagnosis not present

## 2015-02-21 DIAGNOSIS — K56609 Unspecified intestinal obstruction, unspecified as to partial versus complete obstruction: Secondary | ICD-10-CM

## 2015-02-21 DIAGNOSIS — Z720 Tobacco use: Secondary | ICD-10-CM

## 2015-02-21 DIAGNOSIS — B961 Klebsiella pneumoniae [K. pneumoniae] as the cause of diseases classified elsewhere: Secondary | ICD-10-CM | POA: Diagnosis present

## 2015-02-21 DIAGNOSIS — K5669 Other intestinal obstruction: Secondary | ICD-10-CM | POA: Diagnosis not present

## 2015-02-21 DIAGNOSIS — A599 Trichomoniasis, unspecified: Secondary | ICD-10-CM | POA: Diagnosis not present

## 2015-02-21 DIAGNOSIS — R1012 Left upper quadrant pain: Secondary | ICD-10-CM | POA: Diagnosis not present

## 2015-02-21 DIAGNOSIS — K254 Chronic or unspecified gastric ulcer with hemorrhage: Secondary | ICD-10-CM | POA: Diagnosis not present

## 2015-02-21 DIAGNOSIS — Z823 Family history of stroke: Secondary | ICD-10-CM

## 2015-02-21 DIAGNOSIS — K922 Gastrointestinal hemorrhage, unspecified: Secondary | ICD-10-CM | POA: Insufficient documentation

## 2015-02-21 DIAGNOSIS — K5660 Unspecified intestinal obstruction: Secondary | ICD-10-CM | POA: Diagnosis not present

## 2015-02-21 DIAGNOSIS — K259 Gastric ulcer, unspecified as acute or chronic, without hemorrhage or perforation: Secondary | ICD-10-CM | POA: Insufficient documentation

## 2015-02-21 DIAGNOSIS — Z8249 Family history of ischemic heart disease and other diseases of the circulatory system: Secondary | ICD-10-CM

## 2015-02-21 DIAGNOSIS — F17210 Nicotine dependence, cigarettes, uncomplicated: Secondary | ICD-10-CM | POA: Diagnosis present

## 2015-02-21 DIAGNOSIS — I10 Essential (primary) hypertension: Secondary | ICD-10-CM | POA: Diagnosis not present

## 2015-02-21 DIAGNOSIS — Z4682 Encounter for fitting and adjustment of non-vascular catheter: Secondary | ICD-10-CM | POA: Diagnosis not present

## 2015-02-21 DIAGNOSIS — E875 Hyperkalemia: Secondary | ICD-10-CM | POA: Diagnosis present

## 2015-02-21 DIAGNOSIS — M1990 Unspecified osteoarthritis, unspecified site: Secondary | ICD-10-CM | POA: Diagnosis present

## 2015-02-21 DIAGNOSIS — E876 Hypokalemia: Secondary | ICD-10-CM | POA: Diagnosis present

## 2015-02-21 DIAGNOSIS — Z9049 Acquired absence of other specified parts of digestive tract: Secondary | ICD-10-CM | POA: Diagnosis present

## 2015-02-21 DIAGNOSIS — K50912 Crohn's disease, unspecified, with intestinal obstruction: Secondary | ICD-10-CM

## 2015-02-21 DIAGNOSIS — Z4659 Encounter for fitting and adjustment of other gastrointestinal appliance and device: Secondary | ICD-10-CM

## 2015-02-21 DIAGNOSIS — Z8673 Personal history of transient ischemic attack (TIA), and cerebral infarction without residual deficits: Secondary | ICD-10-CM

## 2015-02-21 DIAGNOSIS — J449 Chronic obstructive pulmonary disease, unspecified: Secondary | ICD-10-CM | POA: Diagnosis not present

## 2015-02-21 DIAGNOSIS — R1032 Left lower quadrant pain: Secondary | ICD-10-CM | POA: Diagnosis not present

## 2015-02-21 DIAGNOSIS — R109 Unspecified abdominal pain: Secondary | ICD-10-CM | POA: Diagnosis not present

## 2015-02-21 LAB — COMPREHENSIVE METABOLIC PANEL
ALT: 13 U/L — AB (ref 14–54)
AST: 16 U/L (ref 15–41)
Albumin: 4 g/dL (ref 3.5–5.0)
Alkaline Phosphatase: 70 U/L (ref 38–126)
Anion gap: 7 (ref 5–15)
BUN: 7 mg/dL (ref 6–20)
CO2: 26 mmol/L (ref 22–32)
CREATININE: 0.85 mg/dL (ref 0.44–1.00)
Calcium: 9.2 mg/dL (ref 8.9–10.3)
Chloride: 105 mmol/L (ref 101–111)
GFR calc Af Amer: >60 mL/min (ref >60)
GFR calc non Af Amer: >60 mL/min (ref >60)
Glucose, Bld: 101 mg/dL — ABNORMAL HIGH (ref 65–99)
Potassium: 3.8 mmol/L (ref 3.5–5.1)
Sodium: 138 mmol/L (ref 135–145)
Total Bilirubin: 0.4 mg/dL (ref 0.3–1.2)
Total Protein: 7.3 g/dL (ref 6.5–8.1)

## 2015-02-21 LAB — CBC WITH DIFFERENTIAL/PLATELET
Basophils Absolute: 0 10*3/uL (ref 0.0–0.1)
Basophils Relative: 0 % (ref 0–1)
EOS PCT: 1 % (ref 0–5)
Eosinophils Absolute: 0.1 10*3/uL (ref 0.0–0.7)
HCT: 40.6 % (ref 36.0–46.0)
HEMOGLOBIN: 13.7 g/dL (ref 12.0–15.0)
LYMPHS ABS: 2.1 10*3/uL (ref 0.7–4.0)
Lymphocytes Relative: 13 % (ref 12–46)
MCH: 32.2 pg (ref 26.0–34.0)
MCHC: 33.7 g/dL (ref 30.0–36.0)
MCV: 95.3 fL (ref 78.0–100.0)
Monocytes Absolute: 0.6 10*3/uL (ref 0.1–1.0)
Monocytes Relative: 4 % (ref 3–12)
NEUTROS PCT: 82 % — AB (ref 43–77)
Neutro Abs: 13.4 10*3/uL — ABNORMAL HIGH (ref 1.7–7.7)
Platelets: 231 10*3/uL (ref 150–400)
RBC: 4.26 MIL/uL (ref 3.87–5.11)
RDW: 12.6 % (ref 11.5–15.5)
WBC: 16.3 10*3/uL — ABNORMAL HIGH (ref 4.0–10.5)

## 2015-02-21 LAB — URINALYSIS, ROUTINE W REFLEX MICROSCOPIC
Bilirubin Urine: NEGATIVE
Glucose, UA: NEGATIVE mg/dL
Ketones, ur: NEGATIVE mg/dL
Nitrite: NEGATIVE
Protein, ur: NEGATIVE mg/dL
Specific Gravity, Urine: 1.01 (ref 1.005–1.030)
UROBILINOGEN UA: 0.2 mg/dL (ref 0.0–1.0)
pH: 6 (ref 5.0–8.0)

## 2015-02-21 LAB — URINE MICROSCOPIC-ADD ON

## 2015-02-21 LAB — LIPASE, BLOOD: Lipase: 27 U/L (ref 22–51)

## 2015-02-21 MED ORDER — LIDOCAINE HCL 2 % EX GEL
CUTANEOUS | Status: AC
  Filled 2015-02-21: qty 10

## 2015-02-21 MED ORDER — ONDANSETRON HCL 4 MG/2ML IJ SOLN
4.0000 mg | Freq: Once | INTRAMUSCULAR | Status: AC
  Administered 2015-02-21: 4 mg via INTRAVENOUS
  Filled 2015-02-21: qty 2

## 2015-02-21 MED ORDER — IOHEXOL 300 MG/ML  SOLN
50.0000 mL | Freq: Once | INTRAMUSCULAR | Status: AC | PRN
  Administered 2015-02-21: 50 mL via ORAL

## 2015-02-21 MED ORDER — ENOXAPARIN SODIUM 40 MG/0.4ML ~~LOC~~ SOLN
40.0000 mg | SUBCUTANEOUS | Status: DC
Start: 2015-02-21 — End: 2015-02-23
  Administered 2015-02-22: 40 mg via SUBCUTANEOUS
  Filled 2015-02-21: qty 0.4

## 2015-02-21 MED ORDER — FAMOTIDINE IN NACL 20-0.9 MG/50ML-% IV SOLN
20.0000 mg | Freq: Once | INTRAVENOUS | Status: AC
  Administered 2015-02-21: 20 mg via INTRAVENOUS
  Filled 2015-02-21: qty 50

## 2015-02-21 MED ORDER — SODIUM CHLORIDE 0.9 % IV SOLN
INTRAVENOUS | Status: AC
Start: 2015-02-21 — End: 2015-02-22
  Administered 2015-02-21: 20:00:00 via INTRAVENOUS

## 2015-02-21 MED ORDER — METRONIDAZOLE IN NACL 5-0.79 MG/ML-% IV SOLN
500.0000 mg | Freq: Two times a day (BID) | INTRAVENOUS | Status: DC
  Filled 2015-02-21: qty 100

## 2015-02-21 MED ORDER — SODIUM CHLORIDE 0.9 % IV SOLN
INTRAVENOUS | Status: DC
  Administered 2015-02-21: 15:00:00 via INTRAVENOUS

## 2015-02-21 MED ORDER — IOHEXOL 300 MG/ML  SOLN
100.0000 mL | Freq: Once | INTRAMUSCULAR | Status: AC | PRN
  Administered 2015-02-21: 100 mL via INTRAVENOUS

## 2015-02-21 MED ORDER — DEXTROSE 5 % IV SOLN
1.0000 g | INTRAVENOUS | Status: DC
  Administered 2015-02-21 – 2015-02-23 (×3): 1 g via INTRAVENOUS
  Filled 2015-02-21 (×5): qty 10

## 2015-02-21 MED ORDER — MORPHINE SULFATE (PF) 4 MG/ML IV SOLN: 4.0000 mg | INTRAVENOUS | Status: DC | PRN

## 2015-02-21 MED ORDER — ONDANSETRON HCL 4 MG PO TABS: 4.0000 mg | ORAL_TABLET | Freq: Four times a day (QID) | ORAL | Status: DC | PRN

## 2015-02-21 MED ORDER — SODIUM CHLORIDE 0.9 % IV SOLN
INTRAVENOUS | Status: DC
  Administered 2015-02-22 – 2015-02-24 (×3): via INTRAVENOUS

## 2015-02-21 MED ORDER — HYDRALAZINE HCL 20 MG/ML IJ SOLN: 5.0000 mg | INTRAMUSCULAR | Status: DC | PRN

## 2015-02-21 MED ORDER — ONDANSETRON HCL 4 MG/2ML IJ SOLN
4.0000 mg | Freq: Three times a day (TID) | INTRAMUSCULAR | Status: DC | PRN
Start: 2015-02-21 — End: 2015-02-22

## 2015-02-21 MED ORDER — ONDANSETRON HCL 4 MG/2ML IJ SOLN
4.0000 mg | Freq: Four times a day (QID) | INTRAMUSCULAR | Status: DC | PRN
  Administered 2015-02-23 (×2): 4 mg via INTRAVENOUS
  Filled 2015-02-21 (×2): qty 2

## 2015-02-21 MED ORDER — NICOTINE 21 MG/24HR TD PT24
21.0000 mg | MEDICATED_PATCH | Freq: Every day | TRANSDERMAL | Status: DC
  Administered 2015-02-22 – 2015-02-25 (×4): 21 mg via TRANSDERMAL
  Filled 2015-02-21 (×4): qty 1

## 2015-02-21 NOTE — ED Provider Notes (Signed)
CSN: [id number]     Arrival date & time 02/21/15  1152 History   First MD Initiated Contact with Patient 02/21/15 1433     Chief Complaint  Patient presents with  . Abdominal Pain      HPI  Pt was seen at 1435.  Per pt, c/o gradual onset and persistence of constant abd "pain" since yesterday.  Has been associated with nausea.  Describes the abd pain as "aching."  Denies vomiting/diarrhea, no fevers, no back pain, no rash, no CP/SOB, no black or blood in stools.      Past Medical History  Diagnosis Date  . Crohn's disease   . Hypertension   . Stroke   . Arthritis   . Sciatica of right side   . Degenerative disc disease, lumbar   . Ventral hernia    Past Surgical History  Procedure Laterality Date  . Cholecystectomy      2008  . Carotid endarterectomy      2004  . Hemicolectomy      right  . Tubal ligation    . Incisional hernia repair N/A 05/20/2013    Procedure: [name] HERNIORRHAPHY WITH MESH;  Surgeon: Jamesetta So, MD;  Location: [facility];  Service: General;  Laterality: N/A;  . Insertion of mesh N/A 05/20/2013    Procedure: INSERTION OF MESH;  Surgeon: Jamesetta So, MD;  Location: [facility];  Service: General;  Laterality: N/A;  . Incisional hernia repair N/A 05/19/2014    Procedure: RECURRENT HERNIA REPAIR INCISIONAL;  Surgeon: Jamesetta So, MD;  Location: [facility];  Service: General;  Laterality: N/A;  . Insertion of mesh N/A 05/19/2014    Procedure: INSERTION OF MESH;  Surgeon: Jamesetta So, MD;  Location: [facility];  Service: General;  Laterality: N/A;   Family History  Problem Relation Age of Onset  . GI problems Father   . Stroke Brother   . Heart attack Mother   . Hypotension Sister    Social History  Substance Use Topics  . Smoking status: Current Every Day Smoker -- 1.00 packs/day for 30 years  . Smokeless tobacco: Never Used     Comment: 1-2 pack day x 20 yrs.  . Alcohol Use: No     Comment: one every 2 -3 months.    Review of Systems ROS:  Statement: All systems negative except as marked or noted in the HPI; Constitutional: Negative for fever and chills. ; ; Eyes: Negative for eye pain, redness and discharge. ; ; ENMT: Negative for ear pain, hoarseness, nasal congestion, sinus pressure and sore throat. ; ; Cardiovascular: Negative for chest pain, palpitations, diaphoresis, dyspnea and peripheral edema. ; ; Respiratory: Negative for cough, wheezing and stridor. ; ; Gastrointestinal: +nausea, abd pain. Negative for vomiting, diarrhea, blood in stool, hematemesis, jaundice and rectal bleeding. . ; ; Genitourinary: Negative for dysuria, flank pain and hematuria. ; ; Musculoskeletal: Negative for back pain and neck pain. Negative for swelling and trauma.; ; Skin: Negative for pruritus, rash, abrasions, blisters, bruising and skin lesion.; ; Neuro: Negative for headache, lightheadedness and neck stiffness. Negative for weakness, altered level of consciousness , altered mental status, extremity weakness, paresthesias, involuntary movement, seizure and syncope.      Allergies  Flagyl and Other  Home Medications   Prior to Admission medications   Medication Sig Start Date End Date Taking? Authorizing Provider  acetaminophen (TYLENOL) 500 MG tablet Take 1,000 mg by mouth daily as needed for mild pain or moderate pain.  Yes Historical Provider, MD  aspirin EC 81 MG tablet Take 81 mg by mouth daily.   Yes Historical Provider, MD  DELZICOL 400 MG CPDR DR capsule TAKE 2 CAPSULES BY MOUTH TWICE DAILY. 01/05/15  Yes Butch Penny, NP  lisinopril (PRINIVIL,ZESTRIL) 20 MG tablet Take 10 mg by mouth daily.   Yes Historical Provider, MD  ondansetron (ZOFRAN) 4 MG tablet Take 4 mg by mouth every 8 (eight) hours as needed for nausea or vomiting.    Yes Historical Provider, MD  diphenhydrAMINE (BENADRYL) 25 mg capsule Take 25 mg by mouth every 6 (six) hours as needed for itching.    Historical Provider, MD   BP 114/72 mmHg  Pulse 62  Temp(Src) 99.1 F  (37.3 C) (Rectal)  Resp 18  Ht 5' 2"  (1.575 m)  Wt 132 lb (59.875 kg)  BMI 24.14 kg/m2  SpO2 100% Physical Exam  1440: Physical examination:  Nursing notes reviewed; Vital signs and O2 SAT reviewed;  Constitutional: Well developed, Well nourished, Well hydrated, In no acute distress; Head:  Normocephalic, atraumatic; Eyes: EOMI, PERRL, No scleral icterus; ENMT: Mouth and pharynx normal, Mucous membranes moist; Neck: Supple, Full range of motion, No lymphadenopathy; Cardiovascular: Regular rate and rhythm, No gallop; Respiratory: Breath sounds clear & equal bilaterally, No wheezes.  Speaking full sentences with ease, Normal respiratory effort/excursion; Chest: Nontender, Movement normal; Abdomen: Soft, +LUQ and LLQ tender to palp. No rebound or guarding. Nondistended, Normal bowel sounds; Genitourinary: No CVA tenderness; Extremities: Pulses normal, No tenderness, No edema, No calf edema or asymmetry.; Neuro: AA&Ox3, Major CN grossly intact.  Speech clear. No gross focal motor or sensory deficits in extremities.; Skin: Color normal, Warm, Dry.   ED Course  Procedures (including critical care time) Labs Review   Imaging Review  I have personally reviewed and evaluated these images and lab results as part of my medical decision-making.   EKG Interpretation None      MDM  MDM Reviewed: previous chart, nursing note and vitals Reviewed previous: labs Interpretation: labs, x-ray and CT scan      Results for orders placed or performed during the hospital encounter of 02/21/15  Urinalysis, Routine w reflex microscopic (not at [facility])  Result Value Ref Range   Color, Urine STRAW (A) YELLOW   APPearance CLEAR CLEAR   Specific Gravity, Urine 1.010 1.005 - 1.030   pH 6.0 5.0 - 8.0   Glucose, UA NEGATIVE NEGATIVE mg/dL   Hgb urine dipstick TRACE (A) NEGATIVE   Bilirubin Urine NEGATIVE NEGATIVE   Ketones, ur NEGATIVE NEGATIVE mg/dL   Protein, ur NEGATIVE NEGATIVE mg/dL   Urobilinogen,  UA 0.2 0.0 - 1.0 mg/dL   Nitrite NEGATIVE NEGATIVE   Leukocytes, UA SMALL (A) NEGATIVE  Comprehensive metabolic panel  Result Value Ref Range   Sodium 138 135 - 145 mmol/L   Potassium 3.8 3.5 - 5.1 mmol/L   Chloride 105 101 - 111 mmol/L   CO2 26 22 - 32 mmol/L   Glucose, Bld 101 (H) 65 - 99 mg/dL   BUN 7 6 - 20 mg/dL   Creatinine, Ser 0.85 0.44 - 1.00 mg/dL   Calcium 9.2 8.9 - 10.3 mg/dL   Total Protein 7.3 6.5 - 8.1 g/dL   Albumin 4.0 3.5 - 5.0 g/dL   AST 16 15 - 41 U/L   ALT 13 (L) 14 - 54 U/L   Alkaline Phosphatase 70 38 - 126 U/L   Total Bilirubin 0.4 0.3 - 1.2 mg/dL   GFR  calc non Af Amer >60 >60 mL/min   GFR calc Af Amer >60 >60 mL/min   Anion gap 7 5 - 15  Lipase, blood  Result Value Ref Range   Lipase 27 22 - 51 U/L  CBC with Differential  Result Value Ref Range   WBC 16.3 (H) 4.0 - 10.5 K/uL   RBC 4.26 3.87 - 5.11 MIL/uL   Hemoglobin 13.7 12.0 - 15.0 g/dL   HCT 40.6 36.0 - 46.0 %   MCV 95.3 78.0 - 100.0 fL   MCH 32.2 26.0 - 34.0 pg   MCHC 33.7 30.0 - 36.0 g/dL   RDW 12.6 11.5 - 15.5 %   Platelets 231 150 - 400 K/uL   Neutrophils Relative % 82 (H) 43 - 77 %   Neutro Abs 13.4 (H) 1.7 - 7.7 K/uL   Lymphocytes Relative 13 12 - 46 %   Lymphs Abs 2.1 0.7 - 4.0 K/uL   Monocytes Relative 4 3 - 12 %   Monocytes Absolute 0.6 0.1 - 1.0 K/uL   Eosinophils Relative 1 0 - 5 %   Eosinophils Absolute 0.1 0.0 - 0.7 K/uL   Basophils Relative 0 0 - 1 %   Basophils Absolute 0.0 0.0 - 0.1 K/uL  Urine microscopic-add on  Result Value Ref Range   Squamous Epithelial / LPF MANY (A) RARE   WBC, UA 21-50 <3 WBC/hpf   RBC / HPF 7-10 <3 RBC/hpf   Bacteria, UA MANY (A) RARE   Urine-Other TRICHOMONAS PRESENT    Dg Chest 2 View 02/21/2015   CLINICAL DATA:  Left upper quadrant pain.  Smoker  EXAM: CHEST  2 VIEW  COMPARISON:  04/08/2013  FINDINGS: Heart size and vascularity normal. Mild pulmonary hyperinflation. Mild apical scarring. Negative for pneumonia. Negative for mass or  adenopathy. No pleural effusion.  IMPRESSION: Mild COPD without acute abnormality.   Electronically Signed   By: Franchot Gallo M.D.   On: 02/21/2015 15:38   Ct Abdomen Pelvis W Contrast 02/21/2015   CLINICAL DATA:  Left upper quadrant and flank pain with nausea for 2 days. History of Crohn's disease.  EXAM: CT ABDOMEN AND PELVIS WITH CONTRAST  TECHNIQUE: Multidetector CT imaging of the abdomen and pelvis was performed using the standard protocol following bolus administration of intravenous contrast.  CONTRAST:  51m OMNIPAQUE IOHEXOL 300 MG/ML SOLN, 1094mOMNIPAQUE IOHEXOL 300 MG/ML SOLN  COMPARISON:  04/13/2014  FINDINGS: Lower chest:  Lung bases are clear.  Hepatobiliary: 3 mm too small to characterize lateral segment left hepatic lobe hypodensity image 16 stable. Probable focal fat, medial segment left hepatic lobe. Mild prominence of the intrahepatic bile ducts is identified. Stable degree of extrahepatic common duct dilatation measuring 1.4 cm at its midportion with evidence of cholecystectomy. Tapering to the ampulla is identified.  Pancreas: Pancreatic calcification is noted likely indicating prior pancreatitis. No peripancreatic fluid or hypo enhancement or mass is identified.  Spleen: Normal  Adrenals/Urinary Tract: Adrenal glands are normal in appearance. Bilateral renal cortical cysts and too small to characterize hypodense lesions are reidentified. An area of focal cortical scarring or motion artifact noted image 32.  Stomach/Bowel: Small hiatal hernia. There is dilatation of multiple loops of small bowel beginning at the jejunum and extending to a focal transition point in the ileum at the level of ileal colic anastomosis, image 42 also see image 32 series 3 and sagittal image 33 series 4. Trace stranding is identified in the right lower quadrant around loops of small bowel.  Maximal small bowel diameter is 3.8 cm. No bowel wall thickening with the exception of a short segment of the right colon,  image 40. Distal colonic tics are identified without evidence for diverticulitis.  Vascular/Lymphatic: Moderate atheromatous aortic calcification without aneurysm. No lymphadenopathy.  Other: Uterus and ovaries appear normal. Small pelvic free fluid. The bladder is normal. Evidence of ventral abdominal wall hernia repair. No complicating features identified.  Musculoskeletal: No acute osseous abnormality. Lumbar spine disc degenerative change and mild curvature centered at L2 noted.  IMPRESSION: Small bowel obstruction with focal transition point at the level of the terminal ileum just prior to the ileocolic anastomosis. This may indicate stricture. Mass is considered less likely.  Stable degree of common bile duct dilatation status post cholecystectomy.  Interval ventral abdominal wall hernia repair without complicating feature currently.   Electronically Signed   By: Conchita Paris M.D.   On: 02/21/2015 18:19    1845:  No clear UTI on Udip and pt denies dysuria; UC is pending. Will place NGT. Dx and testing d/w pt and family.  Questions answered.  Verb understanding, agreeable to admit. T/C to [facility]. Marily Memos, case discussed, including:  HPI, pertinent PM/SHx, VS/PE, dx testing, ED course and treatment:  Agreeable to admit, requests to write temporary orders, obtain medical bed to team [facility].    Francine Graven, DO 02/23/15 2207

## 2015-02-21 NOTE — H&P (Signed)
[facility] History and Physical  [name] GXQ:[id number] DOB: 1945-11-29 DOA: 02/21/2015  Referring physician: Dr Thurnell Garbe - [facility] PCP: Leonides Grills, MD   Chief Complaint: Abd pain  HPI: [name] is a 69 y.o. female  Abdominal pain. Started 1 day ago. Radiation to the back. Generalized throughout the abdomen. Constant dull ache with intermittent sharpness. Has not taken anything for her symptoms. States that she typically has daily bowel movements but has not had one today. States that nothing makes her symptoms better. Symptoms are worsened with certain movements. Patient states that she is able to eat a small breakfast but nothing since then. Multiple bouts of nausea and emesis since then. Chills but denies any fevers. Patient states she follows up with her GI doctor every year her last appointment in March 2016 and was told that she is in good health. Unsure when her last scope was.   Review of Systems:  Constitutional:  No weight loss, night sweats, Fevers, chills, fatigue.  HEENT:  No headaches, Difficulty swallowing,Tooth/dental problems,Sore throat,  No sneezing, itching, ear ache, nasal congestion, post nasal drip,  Cardio-vascular:  No chest pain, Orthopnea, PND, swelling in lower extremities, anasarca, dizziness, palpitations  GI: Per HPI Resp:   No shortness of breath with exertion or at rest. No excess mucus, no productive cough, No non-productive cough, No coughing up of blood.No change in color of mucus.No wheezing.No chest wall deformity  Skin:  no rash or lesions.  GU:  no dysuria, change in color of urine, no urgency or frequency. No flank pain.  Musculoskeletal:   No joint pain or swelling. No decreased range of motion. No back pain.  Psych:  No change in mood or affect. No depression or anxiety. No memory loss.   Past Medical History  Diagnosis Date  . Crohn's disease   . Hypertension   . Stroke   . Arthritis   . Sciatica of right side   .  Degenerative disc disease, lumbar   . Ventral hernia    Past Surgical History  Procedure Laterality Date  . Cholecystectomy      2008  . Carotid endarterectomy      2004  . Hemicolectomy      right  . Tubal ligation    . Incisional hernia repair N/A 05/20/2013    Procedure: [name] HERNIORRHAPHY WITH MESH;  Surgeon: Jamesetta So, MD;  Location: [facility];  Service: General;  Laterality: N/A;  . Insertion of mesh N/A 05/20/2013    Procedure: INSERTION OF MESH;  Surgeon: Jamesetta So, MD;  Location: [facility];  Service: General;  Laterality: N/A;  . Incisional hernia repair N/A 05/19/2014    Procedure: RECURRENT HERNIA REPAIR INCISIONAL;  Surgeon: Jamesetta So, MD;  Location: [facility];  Service: General;  Laterality: N/A;  . Insertion of mesh N/A 05/19/2014    Procedure: INSERTION OF MESH;  Surgeon: Jamesetta So, MD;  Location: [facility];  Service: General;  Laterality: N/A;   Social History:  reports that she has been smoking.  She has never used smokeless tobacco. She reports that she does not drink alcohol or use illicit drugs.  Allergies  Allergen Reactions  . Flagyl [Metronidazole Hcl] Other (See Comments)    "makes me feel whoosy and weird"  . Other     Unknown antibiotic    Family History  Problem Relation Age of Onset  . GI problems Father   . Stroke Brother   . Heart attack  Mother   . Hypotension Sister      Prior to Admission medications   Medication Sig Start Date End Date Taking? Authorizing Provider  acetaminophen (TYLENOL) 500 MG tablet Take 1,000 mg by mouth daily as needed for mild pain or moderate pain.   Yes Historical Provider, MD  aspirin EC 81 MG tablet Take 81 mg by mouth daily.   Yes Historical Provider, MD  DELZICOL 400 MG CPDR DR capsule TAKE 2 CAPSULES BY MOUTH TWICE DAILY. 01/05/15  Yes Butch Penny, NP  lisinopril (PRINIVIL,ZESTRIL) 20 MG tablet Take 10 mg by mouth daily.   Yes Historical Provider, MD  ondansetron (ZOFRAN) 4 MG tablet Take 4 mg by  mouth every 8 (eight) hours as needed for nausea or vomiting.    Yes Historical Provider, MD  diphenhydrAMINE (BENADRYL) 25 mg capsule Take 25 mg by mouth every 6 (six) hours as needed for itching.    Historical Provider, MD   Physical Exam: Filed Vitals:   02/21/15 1436 02/21/15 1626 02/21/15 1644 02/21/15 1900  BP: 114/72  137/72 180/96  Pulse: 62  74 91  Temp:  99.1 F (37.3 C)    TempSrc:  Rectal    Resp: 18  16 16   Height:      Weight:      SpO2: 100%  100% 99%    Wt Readings from Last 3 Encounters:  02/21/15 59.875 kg (132 lb)  08/22/14 59.104 kg (130 lb 4.8 oz)  05/19/14 57.607 kg (127 lb)    General:  Appears calm and comfortable, but is intermittent pain. Eyes:  PERRL, normal lids, irises & conjunctiva ENT:  grossly normal hearing, lips & tongue Neck:  no LAD, masses or thyromegaly Cardiovascular:  RRR, no m/r/g. No LE edema. Respiratory:  CTA bilaterally, no w/r/r. Normal respiratory effort. Abdomen:  Non distended, hypoactive bs, mild ttp w/ deep palpation Skin:  no rash or induration seen on limited exam Musculoskeletal:  grossly normal tone BUE/BLE Psychiatric:  grossly normal mood and affect, speech fluent and appropriate Neurologic:  grossly non-focal.          Labs on Admission:  Basic Metabolic Panel:  Recent Labs Lab 02/21/15 1405  NA 138  K 3.8  CL 105  CO2 26  GLUCOSE 101*  BUN 7  CREATININE 0.85  CALCIUM 9.2   Liver Function Tests:  Recent Labs Lab 02/21/15 1405  AST 16  ALT 13*  ALKPHOS 70  BILITOT 0.4  PROT 7.3  ALBUMIN 4.0    Recent Labs Lab 02/21/15 1405  LIPASE 27   No results for input(s): AMMONIA in the last 168 hours. CBC:  Recent Labs Lab 02/21/15 1405  WBC 16.3*  NEUTROABS 13.4*  HGB 13.7  HCT 40.6  MCV 95.3  PLT 231   Cardiac Enzymes: No results for input(s): CKTOTAL, CKMB, CKMBINDEX, TROPONINI in the last 168 hours.  BNP (last 3 results) No results for input(s): BNP in the last 8760  hours.  ProBNP (last 3 results) No results for input(s): PROBNP in the last 8760 hours.  CBG: No results for input(s): GLUCAP in the last 168 hours.  Radiological Exams on Admission: Dg Chest 2 View  02/21/2015   CLINICAL DATA:  Left upper quadrant pain.  Smoker  EXAM: CHEST  2 VIEW  COMPARISON:  04/08/2013  FINDINGS: Heart size and vascularity normal. Mild pulmonary hyperinflation. Mild apical scarring. Negative for pneumonia. Negative for mass or adenopathy. No pleural effusion.  IMPRESSION: Mild COPD without acute abnormality.  Electronically Signed   By: Franchot Gallo M.D.   On: 02/21/2015 15:38   Ct Abdomen Pelvis W Contrast  02/21/2015   CLINICAL DATA:  Left upper quadrant and flank pain with nausea for 2 days. History of Crohn's disease.  EXAM: CT ABDOMEN AND PELVIS WITH CONTRAST  TECHNIQUE: Multidetector CT imaging of the abdomen and pelvis was performed using the standard protocol following bolus administration of intravenous contrast.  CONTRAST:  28m OMNIPAQUE IOHEXOL 300 MG/ML SOLN, 1042mOMNIPAQUE IOHEXOL 300 MG/ML SOLN  COMPARISON:  04/13/2014  FINDINGS: Lower chest:  Lung bases are clear.  Hepatobiliary: 3 mm too small to characterize lateral segment left hepatic lobe hypodensity image 16 stable. Probable focal fat, medial segment left hepatic lobe. Mild prominence of the intrahepatic bile ducts is identified. Stable degree of extrahepatic common duct dilatation measuring 1.4 cm at its midportion with evidence of cholecystectomy. Tapering to the ampulla is identified.  Pancreas: Pancreatic calcification is noted likely indicating prior pancreatitis. No peripancreatic fluid or hypo enhancement or mass is identified.  Spleen: Normal  Adrenals/Urinary Tract: Adrenal glands are normal in appearance. Bilateral renal cortical cysts and too small to characterize hypodense lesions are reidentified. An area of focal cortical scarring or motion artifact noted image 32.  Stomach/Bowel: Small hiatal  hernia. There is dilatation of multiple loops of small bowel beginning at the jejunum and extending to a focal transition point in the ileum at the level of ileal colic anastomosis, image 42 also see image 32 series 3 and sagittal image 33 series 4. Trace stranding is identified in the right lower quadrant around loops of small bowel. Maximal small bowel diameter is 3.8 cm. No bowel wall thickening with the exception of a short segment of the right colon, image 40. Distal colonic tics are identified without evidence for diverticulitis.  Vascular/Lymphatic: Moderate atheromatous aortic calcification without aneurysm. No lymphadenopathy.  Other: Uterus and ovaries appear normal. Small pelvic free fluid. The bladder is normal. Evidence of ventral abdominal wall hernia repair. No complicating features identified.  Musculoskeletal: No acute osseous abnormality. Lumbar spine disc degenerative change and mild curvature centered at L2 noted.  IMPRESSION: Small bowel obstruction with focal transition point at the level of the terminal ileum just prior to the ileocolic anastomosis. This may indicate stricture. Mass is considered less likely.  Stable degree of common bile duct dilatation status post cholecystectomy.  Interval ventral abdominal wall hernia repair without complicating feature currently.   Electronically Signed   By: GrConchita Paris.D.   On: 02/21/2015 18:19      Assessment/Plan Principal Problem:   SBO (small bowel obstruction) Active Problems:   Crohn's disease   Tobacco abuse   HTN (hypertension)   UTI (lower urinary tract infection)   Trichomonal infection    SBO: SBO noted on CT scan application of her anastomosis. Patient is status post right colectomy in 2008. Patient was stable history of Crohn's disease. Followed yearly by Dr.Rehman. Last appointment was March 2016. Patient unable to keep down any food at this time and is in significant pain.WBC 16.3 - MedSurg - NG tube to  intermittent suction - NPO - hurricane spray - IVF - Consider GI versus surgical consult if condition worsens. - zofran  UTI: UA concerning for UTI including many bacteria, 21-50 WBCs. WBC 16.3 w/ left shift. Of note patient also with trichomonas in urine - Metronidazole - CTX - UCX  Crohn's: stable. Followed by Dr. ReLaural GoldenS/p R hemicolectomy in 2008 - continue Delzicol when  taking PO  HTN: normotensive. Unable to take po - Hydralazine IV PRN - continue lisinorpil when taking PO.   Tobacco: 1ppd  - Nicotine patch   Code Status: FULL DVT Prophylaxis: Lovenox Family Communication: Daughter Disposition Plan: Pending improvemetn     MERRELL, DAVID Lenna Sciara, MD Family Medicine [facility] [email or web address] Password [email or web address]

## 2015-02-21 NOTE — ED Notes (Signed)
Pt returned from X ray. Pt is sitting up drinking her contrast for her CT scheduled for 1715.

## 2015-02-21 NOTE — ED Notes (Signed)
Patient transported to X-ray 

## 2015-02-21 NOTE — ED Notes (Signed)
Patient output 100 ml gastric contents from suction. Total output in ED 700 ml.

## 2015-02-21 NOTE — ED Notes (Signed)
LUQ pain/left flank pain. Pt states pain starts at LUQ and radiates around side. Nausea. Pain began yesterday after lunch.

## 2015-02-21 NOTE — ED Notes (Signed)
600 ml output from NG tube. Intermittent suction at this time.

## 2015-02-21 NOTE — ED Notes (Signed)
Received notice from lab that insufficent urine collected, pt will need to provide more sample

## 2015-02-22 DIAGNOSIS — E876 Hypokalemia: Secondary | ICD-10-CM | POA: Diagnosis present

## 2015-02-22 DIAGNOSIS — F17210 Nicotine dependence, cigarettes, uncomplicated: Secondary | ICD-10-CM | POA: Diagnosis present

## 2015-02-22 DIAGNOSIS — Z8249 Family history of ischemic heart disease and other diseases of the circulatory system: Secondary | ICD-10-CM | POA: Diagnosis not present

## 2015-02-22 DIAGNOSIS — K50919 Crohn's disease, unspecified, with unspecified complications: Secondary | ICD-10-CM | POA: Diagnosis not present

## 2015-02-22 DIAGNOSIS — K5669 Other intestinal obstruction: Secondary | ICD-10-CM | POA: Diagnosis not present

## 2015-02-22 DIAGNOSIS — A599 Trichomoniasis, unspecified: Secondary | ICD-10-CM

## 2015-02-22 DIAGNOSIS — B961 Klebsiella pneumoniae [K. pneumoniae] as the cause of diseases classified elsewhere: Secondary | ICD-10-CM | POA: Diagnosis present

## 2015-02-22 DIAGNOSIS — K5660 Unspecified intestinal obstruction: Secondary | ICD-10-CM | POA: Diagnosis present

## 2015-02-22 DIAGNOSIS — K259 Gastric ulcer, unspecified as acute or chronic, without hemorrhage or perforation: Secondary | ICD-10-CM | POA: Diagnosis not present

## 2015-02-22 DIAGNOSIS — E875 Hyperkalemia: Secondary | ICD-10-CM | POA: Diagnosis present

## 2015-02-22 DIAGNOSIS — I10 Essential (primary) hypertension: Secondary | ICD-10-CM

## 2015-02-22 DIAGNOSIS — Z4659 Encounter for fitting and adjustment of other gastrointestinal appliance and device: Secondary | ICD-10-CM | POA: Diagnosis not present

## 2015-02-22 DIAGNOSIS — Z8673 Personal history of transient ischemic attack (TIA), and cerebral infarction without residual deficits: Secondary | ICD-10-CM | POA: Diagnosis not present

## 2015-02-22 DIAGNOSIS — Z9049 Acquired absence of other specified parts of digestive tract: Secondary | ICD-10-CM | POA: Diagnosis present

## 2015-02-22 DIAGNOSIS — K253 Acute gastric ulcer without hemorrhage or perforation: Secondary | ICD-10-CM | POA: Diagnosis not present

## 2015-02-22 DIAGNOSIS — R109 Unspecified abdominal pain: Secondary | ICD-10-CM | POA: Diagnosis not present

## 2015-02-22 DIAGNOSIS — N390 Urinary tract infection, site not specified: Secondary | ICD-10-CM | POA: Diagnosis not present

## 2015-02-22 DIAGNOSIS — K254 Chronic or unspecified gastric ulcer with hemorrhage: Secondary | ICD-10-CM | POA: Diagnosis present

## 2015-02-22 DIAGNOSIS — K2900 Acute gastritis without bleeding: Secondary | ICD-10-CM | POA: Diagnosis not present

## 2015-02-22 DIAGNOSIS — K922 Gastrointestinal hemorrhage, unspecified: Secondary | ICD-10-CM | POA: Diagnosis not present

## 2015-02-22 DIAGNOSIS — M1990 Unspecified osteoarthritis, unspecified site: Secondary | ICD-10-CM | POA: Diagnosis present

## 2015-02-22 DIAGNOSIS — Z823 Family history of stroke: Secondary | ICD-10-CM | POA: Diagnosis not present

## 2015-02-22 LAB — BASIC METABOLIC PANEL
ANION GAP: 7 (ref 5–15)
BUN: 7 mg/dL (ref 6–20)
CO2: 26 mmol/L (ref 22–32)
Calcium: 8.1 mg/dL — ABNORMAL LOW (ref 8.9–10.3)
Chloride: 107 mmol/L (ref 101–111)
Creatinine, Ser: 0.69 mg/dL (ref 0.44–1.00)
GFR calc Af Amer: >60 mL/min (ref >60)
GLUCOSE: 95 mg/dL (ref 65–99)
POTASSIUM: 2.8 mmol/L — AB (ref 3.5–5.1)
Sodium: 140 mmol/L (ref 135–145)

## 2015-02-22 LAB — CBC
HCT: 37.5 % (ref 36.0–46.0)
Hemoglobin: 12.7 g/dL (ref 12.0–15.0)
MCH: 31.8 pg (ref 26.0–34.0)
MCHC: 33.9 g/dL (ref 30.0–36.0)
MCV: 94 fL (ref 78.0–100.0)
PLATELETS: 213 10*3/uL (ref 150–400)
RBC: 3.99 MIL/uL (ref 3.87–5.11)
RDW: 12.6 % (ref 11.5–15.5)
WBC: 13.3 10*3/uL — AB (ref 4.0–10.5)

## 2015-02-22 MED ORDER — POTASSIUM CHLORIDE 10 MEQ/100ML IV SOLN
10.0000 meq | INTRAVENOUS | Status: AC
  Administered 2015-02-22 (×6): 10 meq via INTRAVENOUS
  Filled 2015-02-22: qty 100

## 2015-02-22 MED ORDER — BISACODYL 10 MG RE SUPP
10.0000 mg | Freq: Once | RECTAL | Status: DC
  Filled 2015-02-22: qty 1

## 2015-02-22 NOTE — Progress Notes (Signed)
[facility] PROGRESS NOTE  [name] PJK:[id number] DOB: 05-13-1946 DOA: 02/21/2015 PCP: Leonides Grills, MD  Assessment/Plan: 1. SBO. Pt has a history of prior abdominal surgeries. She presents with abdominal pain and vomiting. CT abdomen idiicated SBO. NG-tube was placed for decompression with improvement of he symptoms. She reports having one bowel movement this AM. Will give one depository today to promote bowel function. If she continues to have bowel movements and start flattus, can consider clamping NG-tube and starting clear liquids. If she fails to progress, will need to consult general surgery. Encourage ambulation. 2. UTI. UA consistent with many bacteria and trichomonas. WBC improving. Follow up UC. Will plan on treating with one dose of oral Flagly for trichomonas once bowel issues have resolved. Continue Rocephin for now. 3. Crohn's Disease. Continue to follow up with GI for Crohn's. Continue Delzicol. 4. Hyperkalemia. Replace and check magnesium.  5. HTN. IV Hydralazine as need. Oral antihypertensives currently on hold due to bowel issues. 6. Tobacco disorder. Counseled on importance of smoking cessation.  Code Status: Full DVT Prophylaxis: Lovenox Family discussion: Dicussed plan in detail. No further concerns at this time. Disposition Plan: Discharge home once improved  Consultants:    Procedures:    Antibiotics:  Rocephin 9/7>>  HPI/Subjective: Improving with nasal tube.Reports mouth, nose and throat are sore. BM this today and yesterday morning. Denies nausea.  Objective: Filed Vitals:   02/22/15 0600  BP: 132/71  Pulse: 85  Temp: 98.7 F (37.1 C)  Resp: 18    Intake/Output Summary (Last 24 hours) at 02/22/15 1301 Last data filed at 02/22/15 1153  Gross per 24 hour  Intake  807.5 ml  Output   1450 ml  Net -642.5 ml   Filed Weights   02/21/15 1157 02/21/15 2058  Weight: 59.875 kg (132 lb) 59.603 kg (131 lb 6.4 oz)    Exam:  General:   Appears calm and comfortable ENT: grossly normal hearing, lips & tongue. NG-tube Cardiovascular: RRR, no m/r/g. No LE edema. Respiratory: CTA bilaterally, no w/r/r. Normal respiratory effort. Abdomen: soft, Mild tenderness LLQ. Positive bowel sounds.  Musculoskeletal: grossly normal tone BUE/BLE Psychiatric: grossly normal mood and affect, speech fluent and appropriate   Data Reviewed: Basic Metabolic Panel:  Recent Labs Lab 02/21/15 1405 02/22/15 0614  NA 138 140  K 3.8 2.8*  CL 105 107  CO2 26 26  GLUCOSE 101* 95  BUN 7 7  CREATININE 0.85 0.69  CALCIUM 9.2 8.1*   Liver Function Tests:  Recent Labs Lab 02/21/15 1405  AST 16  ALT 13*  ALKPHOS 70  BILITOT 0.4  PROT 7.3  ALBUMIN 4.0    Recent Labs Lab 02/21/15 1405  LIPASE 27   CBC:  Recent Labs Lab 02/21/15 1405 02/22/15 0614  WBC 16.3* 13.3*  NEUTROABS 13.4*  --   HGB 13.7 12.7  HCT 40.6 37.5  MCV 95.3 94.0  PLT 231 213   Cardiac Enzymes:  BNP (last 3 results)  ProBNP (last 3 results)  CBG:   Studies: Dg Chest 2 View  02/21/2015   CLINICAL DATA:  Left upper quadrant pain.  Smoker  EXAM: CHEST  2 VIEW  COMPARISON:  04/08/2013  FINDINGS: Heart size and vascularity normal. Mild pulmonary hyperinflation. Mild apical scarring. Negative for pneumonia. Negative for mass or adenopathy. No pleural effusion.  IMPRESSION: Mild COPD without acute abnormality.   Electronically Signed   By: Franchot Gallo M.D.   On: 02/21/2015 15:38   Ct Abdomen Pelvis W Contrast  02/21/2015   CLINICAL DATA:  Left upper quadrant and flank pain with nausea for 2 days. History of Crohn's disease.  EXAM: CT ABDOMEN AND PELVIS WITH CONTRAST  TECHNIQUE: Multidetector CT imaging of the abdomen and pelvis was performed using the standard protocol following bolus administration of intravenous contrast.  CONTRAST:  55m OMNIPAQUE IOHEXOL 300 MG/ML SOLN, 107mOMNIPAQUE IOHEXOL 300 MG/ML SOLN  COMPARISON:  04/13/2014  FINDINGS: Lower  chest:  Lung bases are clear.  Hepatobiliary: 3 mm too small to characterize lateral segment left hepatic lobe hypodensity image 16 stable. Probable focal fat, medial segment left hepatic lobe. Mild prominence of the intrahepatic bile ducts is identified. Stable degree of extrahepatic common duct dilatation measuring 1.4 cm at its midportion with evidence of cholecystectomy. Tapering to the ampulla is identified.  Pancreas: Pancreatic calcification is noted likely indicating prior pancreatitis. No peripancreatic fluid or hypo enhancement or mass is identified.  Spleen: Normal  Adrenals/Urinary Tract: Adrenal glands are normal in appearance. Bilateral renal cortical cysts and too small to characterize hypodense lesions are reidentified. An area of focal cortical scarring or motion artifact noted image 32.  Stomach/Bowel: Small hiatal hernia. There is dilatation of multiple loops of small bowel beginning at the jejunum and extending to a focal transition point in the ileum at the level of ileal colic anastomosis, image 42 also see image 32 series 3 and sagittal image 33 series 4. Trace stranding is identified in the right lower quadrant around loops of small bowel. Maximal small bowel diameter is 3.8 cm. No bowel wall thickening with the exception of a short segment of the right colon, image 40. Distal colonic tics are identified without evidence for diverticulitis.  Vascular/Lymphatic: Moderate atheromatous aortic calcification without aneurysm. No lymphadenopathy.  Other: Uterus and ovaries appear normal. Small pelvic free fluid. The bladder is normal. Evidence of ventral abdominal wall hernia repair. No complicating features identified.  Musculoskeletal: No acute osseous abnormality. Lumbar spine disc degenerative change and mild curvature centered at L2 noted.  IMPRESSION: Small bowel obstruction with focal transition point at the level of the terminal ileum just prior to the ileocolic anastomosis. This may  indicate stricture. Mass is considered less likely.  Stable degree of common bile duct dilatation status post cholecystectomy.  Interval ventral abdominal wall hernia repair without complicating feature currently.   Electronically Signed   By: GrConchita Paris.D.   On: 02/21/2015 18:19   Dg Chest Port 1 View  02/21/2015   CLINICAL DATA:  NG tube placement.  EXAM: PORTABLE CHEST - 1 VIEW  COMPARISON:  PA and lateral chest earlier today.  FINDINGS: NG tube is in place with the side port and tip in the stomach. The lungs are clear. Heart size is normal. No pneumothorax or pleural effusion.  IMPRESSION: NG tube in good position.  No acute abnormality.   Electronically Signed   By: ThInge Rise.D.   On: 02/21/2015 19:40    Scheduled Meds: . bisacodyl  10 mg Rectal Once  . cefTRIAXone (ROCEPHIN)  IV  1 g Intravenous Q24H  . enoxaparin (LOVENOX) injection  40 mg Subcutaneous Q24H  . nicotine  21 mg Transdermal Daily  . potassium chloride  10 mEq Intravenous Q1 Hr x 6   Continuous Infusions: . sodium chloride Stopped (02/21/15 1626)  . sodium chloride 75 mL/hr at 02/22/15 1007    Principal Problem:   SBO (small bowel obstruction) Active Problems:   Crohn's disease   Tobacco abuse   HTN (hypertension)  UTI (lower urinary tract infection)   Trichomonal infection    Time spent: 35 minutes   By signing my name below, I, Rhett Bannister attest that this documentation has been prepared under the direction and in the presence of Dr. Kathie Dike, M.D. on 02/22/2015   .Kathie Dike, M.D.  [facility] Pager [phone]. If 7PM-7AM, please contact night-coverage at [email or web address], password [facility] 02/22/2015, 1:01 PM      I have reviewed the above documentation for accuracy and completeness, and I agree with the above.  Ashish Rossetti

## 2015-02-22 NOTE — Progress Notes (Signed)
Pt refuses the Flagyl. She states she will not take this medication because it makes her crazy out of her mind. Notified MD. Awaiting response.

## 2015-02-22 NOTE — Plan of Care (Signed)
Problem: Phase I Progression Outcomes Goal: Pain controlled with appropriate interventions Outcome: Completed/Met Date Met:  02/22/15 Pt denies pain Goal: OOB as tolerated unless otherwise ordered Outcome: Progressing Pt using bedside commode Goal: Initial discharge plan identified Outcome: Completed/Met Date Met:  02/22/15 Estimated length of stay is 2 days. Goal: Voiding-avoid urinary catheter unless indicated Outcome: Completed/Met Date Met:  02/22/15 Pt does not have a urinary catheter, and is using the BSC. Goal: Hemodynamically stable Outcome: Progressing See flowsheet.

## 2015-02-22 NOTE — Progress Notes (Signed)
Informed by patient that Flagyl "makes her crazy." Pt has refused Flagyl medication. MD notified and made aware.

## 2015-02-23 DIAGNOSIS — K5669 Other intestinal obstruction: Secondary | ICD-10-CM

## 2015-02-23 DIAGNOSIS — Z4659 Encounter for fitting and adjustment of other gastrointestinal appliance and device: Secondary | ICD-10-CM

## 2015-02-23 DIAGNOSIS — N390 Urinary tract infection, site not specified: Secondary | ICD-10-CM

## 2015-02-23 LAB — BASIC METABOLIC PANEL
Anion gap: 8 (ref 5–15)
BUN: 7 mg/dL (ref 6–20)
CALCIUM: 8.2 mg/dL — AB (ref 8.9–10.3)
CO2: 27 mmol/L (ref 22–32)
CREATININE: 0.65 mg/dL (ref 0.44–1.00)
Chloride: 101 mmol/L (ref 101–111)
Glucose, Bld: 86 mg/dL (ref 65–99)
Potassium: 3.3 mmol/L — ABNORMAL LOW (ref 3.5–5.1)
SODIUM: 136 mmol/L (ref 135–145)

## 2015-02-23 LAB — URINE CULTURE: Culture: >=100000

## 2015-02-23 LAB — CBC
HCT: 38.6 % (ref 36.0–46.0)
Hemoglobin: 13.2 g/dL (ref 12.0–15.0)
MCH: 32.3 pg (ref 26.0–34.0)
MCHC: 34.2 g/dL (ref 30.0–36.0)
MCV: 94.4 fL (ref 78.0–100.0)
PLATELETS: 200 10*3/uL (ref 150–400)
RBC: 4.09 MIL/uL (ref 3.87–5.11)
RDW: 12.3 % (ref 11.5–15.5)
WBC: 13.4 10*3/uL — AB (ref 4.0–10.5)

## 2015-02-23 LAB — MAGNESIUM: MAGNESIUM: 1.7 mg/dL (ref 1.7–2.4)

## 2015-02-23 MED ORDER — POTASSIUM CHLORIDE 10 MEQ/100ML IV SOLN
10.0000 meq | INTRAVENOUS | Status: AC
Start: 2015-02-23 — End: 2015-02-23
  Administered 2015-02-23 (×4): 10 meq via INTRAVENOUS
  Filled 2015-02-23: qty 100

## 2015-02-23 MED ORDER — PANTOPRAZOLE SODIUM 40 MG IV SOLR
40.0000 mg | Freq: Two times a day (BID) | INTRAVENOUS | Status: DC
  Administered 2015-02-23 – 2015-02-25 (×4): 40 mg via INTRAVENOUS
  Filled 2015-02-23 (×4): qty 40

## 2015-02-23 NOTE — Progress Notes (Signed)
[facility] PROGRESS NOTE  [name] VOJ:[id number] DOB: [birth year] DOA: 02/21/2015 PCP: Leonides Grills, MD  Assessment/Plan: 1. SBO. Pt has a history of prior abdominal surgeries. She presents with abdominal pain and vomiting. CT abdomen indicated SBO. NG-tube was placed for decompression with improvement of her symptoms. She continues to have bowel movements without the need of suppositories. NG tube output shows grossly bloody material in canister. ? Trauma from NG tube. Hgb is stable. Will consult GI to see if EGD is needed and general surgery. Encourage ambulation. 2. UTI. UA consistent with many bacteria and trichomonas. UC revealed Klebsiella pneumoniae. Continue Rocephin for now. Will plan on giving one dose of oral Flagyl once bowel issues have resolved.  3. Crohn's Disease. Continue to follow up with GI for Crohn's. Continue Delzicol. 4. Hypokalemia. Continue repletion, Mg is WNL. 5. HTN. IV Hydralazine as need. Oral antihypertensives currently on hold due to bowel issues. 6. Tobacco disorder. Counseled on importance of smoking cessation. Provided nicotine patch.   Code Status: Full DVT Prophylaxis: Lovenox Family discussion: Family bedside  Disposition Plan: Discharge home within 2-3 days.  Consultants:    Procedures:    Antibiotics:  Rocephin 9/7>>  HPI/Subjective: Is doing better. Bowels are moving without difficulty last night 4-5 and this morning 3.  Objective: Filed Vitals:   02/22/15 2223  BP: 172/86  Pulse: 91  Temp: 98.6 F (37 C)  Resp: 15    Intake/Output Summary (Last 24 hours) at 02/23/15 0753 Last data filed at 02/23/15 0503  Gross per 24 hour  Intake  807.5 ml  Output   1450 ml  Net -642.5 ml   Filed Weights   02/21/15 1157 02/21/15 2058  Weight: 59.875 kg (132 lb) 59.603 kg (131 lb 6.4 oz)    Exam: General: NAD, looks comfortable Cardiovascular: RRR, S1, S2  Respiratory: clear bilaterally, No wheezing, rales or  rhnochi Abdomen: soft, non tender, no distention , bowel sounds normal Musculoskeletal: No edema b/l NG tube is draining dark bloody colored material and there is proximally  900cc of fluids in the canister.   Data Reviewed: Basic Metabolic Panel:  Recent Labs Lab 02/21/15 1405 02/22/15 0614 02/23/15 0618  NA 138 140 136  K 3.8 2.8* 3.3*  CL 105 107 101  CO2 26 26 27   GLUCOSE 101* 95 86  BUN 7 7 7   CREATININE 0.85 0.69 0.65  CALCIUM 9.2 8.1* 8.2*  MG  --   --  1.7   Liver Function Tests:  Recent Labs Lab 02/21/15 1405  AST 16  ALT 13*  ALKPHOS 70  BILITOT 0.4  PROT 7.3  ALBUMIN 4.0    Recent Labs Lab 02/21/15 1405  LIPASE 27   CBC:  Recent Labs Lab 02/21/15 1405 02/22/15 0614  WBC 16.3* 13.3*  NEUTROABS 13.4*  --   HGB 13.7 12.7  HCT 40.6 37.5  MCV 95.3 94.0  PLT 231 213   Cardiac Enzymes:  BNP (last 3 results)  ProBNP (last 3 results)  CBG:   Studies: Dg Chest 2 View  02/21/2015   CLINICAL DATA:  Left upper quadrant pain.  Smoker  EXAM: CHEST  2 VIEW  COMPARISON:  04/08/2013  FINDINGS: Heart size and vascularity normal. Mild pulmonary hyperinflation. Mild apical scarring. Negative for pneumonia. Negative for mass or adenopathy. No pleural effusion.  IMPRESSION: Mild COPD without acute abnormality.   Electronically Signed   By: Franchot Gallo M.D.   On: 02/21/2015 15:38   Ct Abdomen Pelvis W  Contrast  02/21/2015   CLINICAL DATA:  Left upper quadrant and flank pain with nausea for 2 days. History of Crohn's disease.  EXAM: CT ABDOMEN AND PELVIS WITH CONTRAST  TECHNIQUE: Multidetector CT imaging of the abdomen and pelvis was performed using the standard protocol following bolus administration of intravenous contrast.  CONTRAST:  55m OMNIPAQUE IOHEXOL 300 MG/ML SOLN, 1024mOMNIPAQUE IOHEXOL 300 MG/ML SOLN  COMPARISON:  04/13/2014  FINDINGS: Lower chest:  Lung bases are clear.  Hepatobiliary: 3 mm too small to characterize lateral segment left hepatic  lobe hypodensity image 16 stable. Probable focal fat, medial segment left hepatic lobe. Mild prominence of the intrahepatic bile ducts is identified. Stable degree of extrahepatic common duct dilatation measuring 1.4 cm at its midportion with evidence of cholecystectomy. Tapering to the ampulla is identified.  Pancreas: Pancreatic calcification is noted likely indicating prior pancreatitis. No peripancreatic fluid or hypo enhancement or mass is identified.  Spleen: Normal  Adrenals/Urinary Tract: Adrenal glands are normal in appearance. Bilateral renal cortical cysts and too small to characterize hypodense lesions are reidentified. An area of focal cortical scarring or motion artifact noted image 32.  Stomach/Bowel: Small hiatal hernia. There is dilatation of multiple loops of small bowel beginning at the jejunum and extending to a focal transition point in the ileum at the level of ileal colic anastomosis, image 42 also see image 32 series 3 and sagittal image 33 series 4. Trace stranding is identified in the right lower quadrant around loops of small bowel. Maximal small bowel diameter is 3.8 cm. No bowel wall thickening with the exception of a short segment of the right colon, image 40. Distal colonic tics are identified without evidence for diverticulitis.  Vascular/Lymphatic: Moderate atheromatous aortic calcification without aneurysm. No lymphadenopathy.  Other: Uterus and ovaries appear normal. Small pelvic free fluid. The bladder is normal. Evidence of ventral abdominal wall hernia repair. No complicating features identified.  Musculoskeletal: No acute osseous abnormality. Lumbar spine disc degenerative change and mild curvature centered at L2 noted.  IMPRESSION: Small bowel obstruction with focal transition point at the level of the terminal ileum just prior to the ileocolic anastomosis. This may indicate stricture. Mass is considered less likely.  Stable degree of common bile duct dilatation status post  cholecystectomy.  Interval ventral abdominal wall hernia repair without complicating feature currently.   Electronically Signed   By: GrConchita Paris.D.   On: 02/21/2015 18:19   Dg Chest Port 1 View  02/21/2015   CLINICAL DATA:  NG tube placement.  EXAM: PORTABLE CHEST - 1 VIEW  COMPARISON:  PA and lateral chest earlier today.  FINDINGS: NG tube is in place with the side port and tip in the stomach. The lungs are clear. Heart size is normal. No pneumothorax or pleural effusion.  IMPRESSION: NG tube in good position.  No acute abnormality.   Electronically Signed   By: ThInge Rise.D.   On: 02/21/2015 19:40    Scheduled Meds: . bisacodyl  10 mg Rectal Once  . cefTRIAXone (ROCEPHIN)  IV  1 g Intravenous Q24H  . enoxaparin (LOVENOX) injection  40 mg Subcutaneous Q24H  . nicotine  21 mg Transdermal Daily   Continuous Infusions: . sodium chloride Stopped (02/21/15 1626)  . sodium chloride 75 mL/hr at 02/22/15 1007    Principal Problem:   SBO (small bowel obstruction) Active Problems:   Crohn's disease   Tobacco abuse   HTN (hypertension)   UTI (lower urinary tract infection)   Trichomonal  infection    Time spent: 25 minutes   .Kathie Dike, M.D.  [facility] Pager [phone]. If 7PM-7AM, please contact night-coverage at [email or web address], password [facility] 02/23/2015, 7:53 AM  LOS: 1 day     By signing my name below, I, Rennis Harding, attest that this documentation has been prepared under the direction and in the presence of Kathie Dike, MD. 02/23/2015 11:13am Electronically signed: Rennis Harding, Scribe.    I have reviewed the above documentation for accuracy and completeness, and I agree with the above.  Kathie Dike, MD

## 2015-02-23 NOTE — Consult Note (Signed)
WOC contacted bedside nurse about consult for right leg wound, after review of the wound documentation flow sheet. Discussed wound with bedside nurse and with patient.  Patient reports the wound was from a mole/skin tag removal per her primary MD, Dr. Nevada Crane.  Bedside nurse describes area as oval shaped and slightly concave, with minimal drainage.  Will order silicone foam to protect recent biopsy/removal site while inpatient and instruct patient to follow up with Dr. Nevada Crane for any concerns with area once DC from hospital.  Discussed POC with patient and bedside nurse.  Re consult if needed, will not follow at this time. Thanks  Ludia Gartland [place], [place] [phone])

## 2015-02-23 NOTE — Care Management Important Message (Signed)
Important Message  Patient Details  Name: [name] MRN: [id number] Date of Birth: Jun 28, 1945   Medicare Important Message Given:  Yes-second notification given    Sherald Barge, RN 02/23/2015, 3:08 PM

## 2015-02-23 NOTE — Consult Note (Addendum)
Referring Provider: Kathie Dike, MD Primary Care Physician:  Leonides Grills, MD Primary Gastroenterologist:  Dr. Laural Golden  Reason for Consultation:    Coffee-ground NG return.  HPI:   Patient is 69 year old Caucasian female was history of small bowel Crohn's disease who was last seen by me in March 2016 and appear to be in remission while on oral mesalamine. She was in usual state of felt until 3 days ago when she developed mid and lower abdominal pain. She did not experience nausea vomiting melena or rectal bleeding. 2 days ago she had soft stools but her pain did not go away. She developed nausea and therefore came to emergency room. On workup she was found to have small bowel obstruction and was therefore hospitalized for further management. NG tube was placed for decompression. She was also found to have urinary tract infection and is on ceftriaxone. Earlier today she NG return was noted to be grossly coffee-ground and she had about 400 mL. She only has had few cc in the last 2-3 hours. She had 3 or 4 bowel movements today. She denies melena or rectal bleeding. She states her nausea has resolved. Pain is decreased and she is hungry. She states she has passed flatus multiple times. She does not take OTC NSAIDs. She denies chronic heartburn or history of peptic ulcer disease. She also denies recent weight loss. She has never been screened for CRC. She has declined to undergo any kind of testing.  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 She is widowed. For assessment died of MI at age 74 and second husband died of COPD in his 74s. She has one daughter who is diabetic. She lives with her daughter and sister. She smokes about a pack cigarettes per day which she is done for over 25 years. She drinks alcohol occasionally. Family history is negative for CRC or IBD.   Past Medical History  Diagnosis Date  . Crohn's disease   . Hypertension   . Stroke   .  Arthritis   . Sciatica of right side   . Degenerative disc disease, lumbar   . Ventral hernia     Past Surgical History  Procedure Laterality Date  . Cholecystectomy      2008  . Carotid endarterectomy      2004  . Hemicolectomy      right  . Tubal ligation    . Incisional hernia repair N/A 05/20/2013    Procedure: [name] HERNIORRHAPHY WITH MESH;  Surgeon: Jamesetta So, MD;  Location: [facility];  Service: General;  Laterality: N/A;  . Insertion of mesh N/A 05/20/2013    Procedure: INSERTION OF MESH;  Surgeon: Jamesetta So, MD;  Location: [facility];  Service: General;  Laterality: N/A;  . Incisional hernia repair N/A 05/19/2014    Procedure: RECURRENT HERNIA REPAIR INCISIONAL;  Surgeon: Jamesetta So, MD;  Location: [facility];  Service: General;  Laterality: N/A;  . Insertion of mesh N/A 05/19/2014    Procedure: INSERTION OF MESH;  Surgeon: Jamesetta So, MD;  Location: [facility];  Service: General;  Laterality: N/A;    Prior to Admission medications   Medication Sig Start Date End Date Taking? Authorizing Provider  acetaminophen (TYLENOL) 500 MG tablet Take 1,000 mg by mouth daily as needed for mild pain or moderate pain.   Yes Historical Provider, MD  aspirin EC 81 MG tablet Take 81 mg by mouth daily.   Yes Historical Provider,  MD  DELZICOL 400 MG CPDR DR capsule TAKE 2 CAPSULES BY MOUTH TWICE DAILY. 01/05/15  Yes Butch Penny, NP  lisinopril (PRINIVIL,ZESTRIL) 20 MG tablet Take 10 mg by mouth daily.   Yes Historical Provider, MD  ondansetron (ZOFRAN) 4 MG tablet Take 4 mg by mouth every 8 (eight) hours as needed for nausea or vomiting.    Yes Historical Provider, MD  diphenhydrAMINE (BENADRYL) 25 mg capsule Take 25 mg by mouth every 6 (six) hours as needed for itching.    Historical Provider, MD    Current Facility-Administered Medications  Medication Dose Route Frequency Provider Last Rate Last Dose  . 0.9 %  sodium chloride infusion   Intravenous Continuous Francine Graven, DO    Stopped at 02/21/15 1626  . 0.9 %  sodium chloride infusion   Intravenous Continuous Waldemar Dickens, MD 75 mL/hr at 02/22/15 1007    . bisacodyl (DULCOLAX) suppository 10 mg  10 mg Rectal Once Kathie Dike, MD   10 mg at 02/22/15 1400  . cefTRIAXone (ROCEPHIN) 1 g in dextrose 5 % 50 mL IVPB  1 g Intravenous Q24H Waldemar Dickens, MD 100 mL/hr at 02/22/15 2031 1 g at 02/22/15 2031  . hydrALAZINE (APRESOLINE) injection 5-10 mg  5-10 mg Intravenous Q4H PRN Waldemar Dickens, MD      . morphine 4 MG/ML injection 4 mg  4 mg Intravenous Q4H PRN Waldemar Dickens, MD      . nicotine (NICODERM CQ - dosed in mg/24 hours) patch 21 mg  21 mg Transdermal Daily Waldemar Dickens, MD   21 mg at 02/23/15 7341  . ondansetron (ZOFRAN) tablet 4 mg  4 mg Oral Q6H PRN Waldemar Dickens, MD       Or  . ondansetron [facility]) injection 4 mg  4 mg Intravenous Q6H PRN Waldemar Dickens, MD   4 mg at 02/23/15 9379    Allergies as of 02/21/2015 - Review Complete 02/21/2015  Allergen Reaction Noted  . Flagyl [metronidazole hcl] Other (See Comments) 12/30/2010  . Other  02/21/2015    Family History  Problem Relation Age of Onset  . GI problems Father   . Stroke Brother   . Heart attack Mother   . Hypotension Sister     Social History   Social History  . Marital Status: Widowed    Spouse Name: N/A  . Number of Children: N/A  . Years of Education: N/A   Occupational History  . Not on file.   Social History Main Topics  . Smoking status: Current Every Day Smoker -- 1.00 packs/day for 30 years  . Smokeless tobacco: Never Used     Comment: 1-2 pack day x 20 yrs.  . Alcohol Use: No     Comment: one every 2 -3 months.  . Drug Use: No  . Sexual Activity: Not on file   Other Topics Concern  . Not on file   Social History Narrative    Review of Systems: See HPI, otherwise normal ROS  Physical Exam: Temp:  [98.6 F (37 C)] 98.6 F (37 C) (09/09 1345) Pulse Rate:  [76-91] 76 (09/09 1345) Resp:  [15-20] 20  (09/09 1345) BP: (141-172)/(82-86) 141/82 mmHg (09/09 1345) SpO2:  [96 %-97 %] 96 % (09/09 1345) Last BM Date: 02/23/15 Patient is alert and in no acute distress. Conjunctiva was pink. Sclerae nonicteric. NG tube is in place draining coffee ground material. No fresh blood noted. Oropharyngeal mucosa is dry otherwise  normal. She is edentulous. No neck masses or thyromegaly noted. Cardiac exam with regular rhythm normal S1 and S2. No murmur or gallop noted. Lungs are clear to auscultation. Abdomen is full with midline abdominal scar. Bowel sounds are normal. On palpation abdomen is soft with mild mid abdominal tenderness. No organomegaly or masses. Rectal examination deferred. No peripheral edema or clubbing noted.  Intake/Output from previous day: 09/08 0701 - 09/09 0700 In: 807.5 [I.V.:807.5] Out: 1450 [Urine:1000; Emesis/NG output:450] Intake/Output this shift: Total I/O In: 0  Out: 450 [Emesis/NG output:450]  Lab Results:  Recent Labs  02/21/15 1405 02/22/15 0614  WBC 16.3* 13.3*  HGB 13.7 12.7  HCT 40.6 37.5  PLT 231 213   BMET  Recent Labs  02/21/15 1405 02/22/15 0614 02/23/15 0618  NA 138 140 136  K 3.8 2.8* 3.3*  CL 105 107 101  CO2 26 26 27   GLUCOSE 101* 95 86  BUN 7 7 7   CREATININE 0.85 0.69 0.65  CALCIUM 9.2 8.1* 8.2*   LFT  Recent Labs  02/21/15 1405  PROT 7.3  ALBUMIN 4.0  AST 16  ALT 13*  ALKPHOS 70  BILITOT 0.4   PT/INR No results for input(s): LABPROT, INR in the last 72 hours. Hepatitis Panel No results for input(s): HEPBSAG, HCVAB, HEPAIGM, HEPBIGM in the last 72 hours.  Studies/Results: Ct Abdomen Pelvis W Contrast  02/21/2015   CLINICAL DATA:  Left upper quadrant and flank pain with nausea for 2 days. History of Crohn's disease.  EXAM: CT ABDOMEN AND PELVIS WITH CONTRAST  TECHNIQUE: Multidetector CT imaging of the abdomen and pelvis was performed using the standard protocol following bolus administration of intravenous contrast.   CONTRAST:  64m OMNIPAQUE IOHEXOL 300 MG/ML SOLN, 1076mOMNIPAQUE IOHEXOL 300 MG/ML SOLN  COMPARISON:  04/13/2014  FINDINGS: Lower chest:  Lung bases are clear.  Hepatobiliary: 3 mm too small to characterize lateral segment left hepatic lobe hypodensity image 16 stable. Probable focal fat, medial segment left hepatic lobe. Mild prominence of the intrahepatic bile ducts is identified. Stable degree of extrahepatic common duct dilatation measuring 1.4 cm at its midportion with evidence of cholecystectomy. Tapering to the ampulla is identified.  Pancreas: Pancreatic calcification is noted likely indicating prior pancreatitis. No peripancreatic fluid or hypo enhancement or mass is identified.  Spleen: Normal  Adrenals/Urinary Tract: Adrenal glands are normal in appearance. Bilateral renal cortical cysts and too small to characterize hypodense lesions are reidentified. An area of focal cortical scarring or motion artifact noted image 32.  Stomach/Bowel: Small hiatal hernia. There is dilatation of multiple loops of small bowel beginning at the jejunum and extending to a focal transition point in the ileum at the level of ileal colic anastomosis, image 42 also see image 32 series 3 and sagittal image 33 series 4. Trace stranding is identified in the right lower quadrant around loops of small bowel. Maximal small bowel diameter is 3.8 cm. No bowel wall thickening with the exception of a short segment of the right colon, image 40. Distal colonic tics are identified without evidence for diverticulitis.  Vascular/Lymphatic: Moderate atheromatous aortic calcification without aneurysm. No lymphadenopathy.  Other: Uterus and ovaries appear normal. Small pelvic free fluid. The bladder is normal. Evidence of ventral abdominal wall hernia repair. No complicating features identified.  Musculoskeletal: No acute osseous abnormality. Lumbar spine disc degenerative change and mild curvature centered at L2 noted.  IMPRESSION: Small bowel  obstruction with focal transition point at the level of the terminal ileum just prior  to the ileocolic anastomosis. This may indicate stricture. Mass is considered less likely.  Stable degree of common bile duct dilatation status post cholecystectomy.  Interval ventral abdominal wall hernia repair without complicating feature currently.   Electronically Signed   By: Conchita Paris M.D.   On: 02/21/2015 18:19   Dg Chest Port 1 View  02/21/2015   CLINICAL DATA:  NG tube placement.  EXAM: PORTABLE CHEST - 1 VIEW  COMPARISON:  PA and lateral chest earlier today.  FINDINGS: NG tube is in place with the side port and tip in the stomach. The lungs are clear. Heart size is normal. No pneumothorax or pleural effusion.  IMPRESSION: NG tube in good position.  No acute abnormality.   Electronically Signed   By: Inge Rise M.D.   On: 02/21/2015 19:40    Assessment; Patient is 69 year old Caucasian female who has history of small bowel Crohn's disease status post right hemicolectomy in January 2008 who has undergone two abdominal surgeries for ventral herniae now presents with small bowel obstruction. She is responding to medical therapy. However NG return is coffee-ground and concerning for upper GI bleed. NG output is more than what I  expect to see with NG tube trauma. Peptic ulcer disease needs to be ruled out. Small bowel obstruction felt to be secondary to adhesions rather than flareup of Crohn's disease. CRP will be checked. If it is high will consider steroids. Patient is being treated for urinary tract infection. She also has Trichomonas in her urine which we treated with oral metronidazole when oral feeding begun. She also has a dilated. System which appears to be chronic. Transaminases are normal. No further evaluation indicated.  Recommendations; Discontinue Lovenox. Mechanical DVT prophylaxis. CBC and CRP in a.m. Pantoprazole 40 mg IV every 12 hours. Diagnostic esophagogastroduodenoscopy in  a.m.    LOS: 1 day   Orie Baxendale U  02/23/2015, 5:27 PM

## 2015-02-24 ENCOUNTER — Encounter (HOSPITAL_COMMUNITY)
Admission: EM | Disposition: A | Discharge status: Home / Self Care | Payer: Self-pay | Source: Home / Self Care | Attending: Internal Medicine

## 2015-02-24 ENCOUNTER — Encounter (HOSPITAL_COMMUNITY): Payer: Self-pay | Admitting: *Deleted

## 2015-02-24 DIAGNOSIS — K259 Gastric ulcer, unspecified as acute or chronic, without hemorrhage or perforation: Secondary | ICD-10-CM | POA: Insufficient documentation

## 2015-02-24 DIAGNOSIS — K922 Gastrointestinal hemorrhage, unspecified: Secondary | ICD-10-CM | POA: Insufficient documentation

## 2015-02-24 DIAGNOSIS — K253 Acute gastric ulcer without hemorrhage or perforation: Secondary | ICD-10-CM

## 2015-02-24 DIAGNOSIS — K5669 Other intestinal obstruction: Secondary | ICD-10-CM

## 2015-02-24 HISTORY — PX: ESOPHAGOGASTRODUODENOSCOPY: SHX5428

## 2015-02-24 LAB — CBC
HCT: 36.9 % (ref 36.0–46.0)
HEMOGLOBIN: 12.5 g/dL (ref 12.0–15.0)
MCH: 32.1 pg (ref 26.0–34.0)
MCHC: 33.9 g/dL (ref 30.0–36.0)
MCV: 94.9 fL (ref 78.0–100.0)
Platelets: 191 10*3/uL (ref 150–400)
RBC: 3.89 MIL/uL (ref 3.87–5.11)
RDW: 12.4 % (ref 11.5–15.5)
WBC: 10.8 10*3/uL — ABNORMAL HIGH (ref 4.0–10.5)

## 2015-02-24 LAB — BASIC METABOLIC PANEL
Anion gap: 9 (ref 5–15)
BUN: 7 mg/dL (ref 6–20)
CHLORIDE: 103 mmol/L (ref 101–111)
CO2: 25 mmol/L (ref 22–32)
CREATININE: 0.63 mg/dL (ref 0.44–1.00)
Calcium: 8.4 mg/dL — ABNORMAL LOW (ref 8.9–10.3)
GFR calc Af Amer: >60 mL/min (ref >60)
GFR calc non Af Amer: >60 mL/min (ref >60)
GLUCOSE: 83 mg/dL (ref 65–99)
POTASSIUM: 3.5 mmol/L (ref 3.5–5.1)
Sodium: 137 mmol/L (ref 135–145)

## 2015-02-24 LAB — C-REACTIVE PROTEIN: CRP: 1.6 mg/dL — ABNORMAL HIGH (ref <1.0)

## 2015-02-24 SURGERY — EGD (ESOPHAGOGASTRODUODENOSCOPY)
Anesthesia: Moderate Sedation

## 2015-02-24 MED ORDER — SODIUM CHLORIDE 0.9 % IV SOLN: INTRAVENOUS | Status: DC

## 2015-02-24 MED ORDER — LIDOCAINE VISCOUS 2 % MT SOLN
OROMUCOSAL | Status: DC | PRN
  Administered 2015-02-24 (×2): 3 mL via OROMUCOSAL

## 2015-02-24 MED ORDER — MIDAZOLAM HCL 5 MG/5ML IJ SOLN
INTRAMUSCULAR | Status: AC
Start: 2015-02-24 — End: 2015-02-24
  Filled 2015-02-24: qty 10

## 2015-02-24 MED ORDER — ONDANSETRON HCL 4 MG/2ML IJ SOLN
INTRAMUSCULAR | Status: DC | PRN
  Administered 2015-02-24: 4 mg via INTRAVENOUS

## 2015-02-24 MED ORDER — MEPERIDINE HCL 100 MG/ML IJ SOLN
INTRAMUSCULAR | Status: AC
  Filled 2015-02-24: qty 2

## 2015-02-24 MED ORDER — SUCRALFATE 1 GM/10ML PO SUSP
1.0000 g | Freq: Three times a day (TID) | ORAL | Status: DC
  Administered 2015-02-24 – 2015-02-25 (×4): 1 g via ORAL
  Filled 2015-02-24 (×4): qty 10

## 2015-02-24 MED ORDER — MEPERIDINE HCL 100 MG/ML IJ SOLN
INTRAMUSCULAR | Status: DC | PRN
  Administered 2015-02-24: 50 mg via INTRAVENOUS

## 2015-02-24 MED ORDER — CIPROFLOXACIN HCL 250 MG PO TABS
250.0000 mg | ORAL_TABLET | Freq: Two times a day (BID) | ORAL | Status: DC
  Administered 2015-02-24 – 2015-02-25 (×3): 250 mg via ORAL
  Filled 2015-02-24 (×3): qty 1

## 2015-02-24 MED ORDER — LIDOCAINE VISCOUS 2 % MT SOLN
OROMUCOSAL | Status: AC
  Filled 2015-02-24: qty 15

## 2015-02-24 MED ORDER — MIDAZOLAM HCL 5 MG/5ML IJ SOLN
INTRAMUSCULAR | Status: DC | PRN
  Administered 2015-02-24: 2 mg via INTRAVENOUS
  Administered 2015-02-24: 1 mg via INTRAVENOUS

## 2015-02-24 MED ORDER — LISINOPRIL 10 MG PO TABS
10.0000 mg | ORAL_TABLET | Freq: Every day | ORAL | Status: DC
  Administered 2015-02-24 – 2015-02-25 (×2): 10 mg via ORAL
  Filled 2015-02-24 (×2): qty 1

## 2015-02-24 MED ORDER — METRONIDAZOLE 500 MG PO TABS
2000.0000 mg | ORAL_TABLET | Freq: Once | ORAL | Status: AC
  Administered 2015-02-24: 2000 mg via ORAL
  Filled 2015-02-24: qty 4

## 2015-02-24 MED ORDER — ONDANSETRON HCL 4 MG/2ML IJ SOLN
INTRAMUSCULAR | Status: AC
  Filled 2015-02-24: qty 2

## 2015-02-24 NOTE — Op Note (Signed)
[facility] [address] [place], [address]   ENDOSCOPY PROCEDURE REPORT  PATIENT: [name], [name]  MR#: [id number] BIRTHDATE: [birth year] , 69  yrs. old GENDER: female ENDOSCOPIST: R.  Garfield Cornea, MD FACP FACG REFERRED BY:  Kerin Perna, M.D.  [name], M.D. PROCEDURE DATE:  03/12/2015 PROCEDURE:  EGD w/ biopsy INDICATIONS:  Upper GI bleed; NG placement recently for SBO; history of ileocolonic Crohn's disease. MEDICATIONS: Versed 3 mg IV and Demerol 50 mg IV in divided doses. Zofran 4 mg IV.  Xylocaine gel orally ASA CLASS:      Class II  CONSENT: The risks, benefits, limitations, alternatives and imponderables have been discussed.  The potential for biopsy, esophogeal dilation, etc. have also been reviewed.  Questions have been answered.  All parties agreeable.  Please see the history and physical in the medical record for more information.  DESCRIPTION OF PROCEDURE: After the risks benefits and alternatives of the procedure were thoroughly explained, informed consent was obtained.  The EG-[id number] ([id number]) endoscope was introduced through the mouth and advanced to the second portion of the duodenum , limited by Without limitations. The instrument was slowly withdrawn as the mucosa was fully examined. Estimated blood loss is zero unless otherwise noted in this procedure report.    Indwelling NG tube removed immediately prior to EGD.  Normal appearing, patent tubular esophagus.  Stomach empty.  Patient multiple punctate hemorrhages along the greater curvature/antrum consistent with NG tube trauma.  Patient also had multiple areas of long linear ulcerations/erosions extending from the body down the greater curvature.  Please see multiple photographs taken.  A couple of the ulcer craters were fairly deep.  Overlying eschar noted on a couple of these lsions.  No active bleeding seen.  These appeared to be benign lesions.  Small hiatal hernia also  present. Patent pylorus.  Normal-appearing first and second portion of the duodenum.  Biopsies of the gastric ulceration taken for histologic study. Minimal bleeding with this maneuver (5 mL).  Retroflexed views revealed as previously described.     The scope was then withdrawn from the patient and the procedure completed.  COMPLICATIONS: There were no immediate complications.  ENDOSCOPIC IMPRESSION: Multiple gastric erosions/ulcerations. Punctate gastric hemorrhages (only latter felt to be related to NG tube trauma). Areas of extensive erosion and ulceration likely pre-existing. Differential diagnosis includes Crohn's disease, ischemia. Would be quite unusual to be related to the trauma heaving alone. These were not Cameron lesions. H. pylori could be in the "background". Based on interview with the family, there is no NSAID exposure aside from one aspirin daily.  RECOMMENDATIONS: Continue Protonix 40 mg twice daily for at least the next 12 weeks. Add Carafate suspension 1 g 4 times a day. Follow up on pathology. Clinically, SBO has resolved. Will advance to a low residue diet later today. CRP pending from this morning's labs. Hemoglobin remains normal.  Further recommendations to follow.  REPEAT EXAM:  eSigned:  R. Garfield Cornea, MD [facility] 03/12/2015 10:44 AM    CC:  CPT CODES: ICD CODES:  The ICD and CPT codes recommended by this software are interpretations from the data that the clinical staff has captured with the software.  The verification of the translation of this report to the ICD and CPT codes and modifiers is the sole responsibility of the health care institution and practicing physician where this report was generated.  [employer] will not be held responsible for the validity of the ICD and  CPT codes included on this report.  AMA assumes no liability for data contained or not contained herein. CPT is a Designer, television/film set of the  [employer].  PATIENT NAME:  Leonette, Tischer MR#: [id number]

## 2015-02-24 NOTE — Progress Notes (Signed)
Tolerated soft diet at dinner well this shift. Denies any c/o ABD pain or N/V during or after eating.

## 2015-02-24 NOTE — Progress Notes (Signed)
[facility] PROGRESS NOTE  [name] VVZ:[id number] DOB: 1946/01/20 DOA: 02/21/2015 PCP: Leonides Grills, MD  Assessment/Plan: 1. SBO. Pt has a history of prior abdominal surgeries. She presents with abdominal pain and vomiting. CT abdomen indicated SBO. NG-tube was placed for decompression with improvement of her symptoms. She continues to have bowel movements without the need of suppositories. NG tube removed today and she is tolerating clear liquids. Will advance diet as tolerated. Encourage ambulation. 2. Upper GI bleeding. S/p EGD which showed multiple gastric erosions and ulcerations. On bid ppi and carafate. Hemoglobin is stable 3. UTI. UA consistent with many bacteria and trichomonas. UC revealed Klebsiella pneumoniae. Change rocephin to oral cipro. Will give one dose of oral flagyl to treat trichomonas 4. Crohn's Disease. Continue to follow up with GI for Crohn's. Continue Delzicol. 5. Hypokalemia. Continue repletion, Mg is WNL. 6. HTN. IV Hydralazine as need. Restart lisinopril 7. Tobacco disorder. Counseled on importance of smoking cessation. Provided nicotine patch.   Code Status: Full DVT Prophylaxis: scd Family discussion: Family bedside  Disposition Plan: Discharge home, hopefully in am  Consultants:  Gastroenterology  Procedures: EGD: Multiple gastric erosions/ulcerations. Punctate gastric hemorrhages (only latter felt to be related to NG tube trauma). Areas of extensive erosion and ulceration likely pre-existing. Differential diagnosis includes Crohn's disease, ischemia. Would be quite unusual to be related to the trauma heaving alone. These were not Cameron lesions. H. pylori could be in the "background". Based on interview with the family, there is no NSAID exposure aside from  one aspirin daily.  Antibiotics:  Rocephin 9/7>>  HPI/Subjective: Had a bowel movement this morning. No nausea or vomiting. NG tube removed. Tolerating clear  liquids.  Objective: Filed Vitals:   02/24/15 1035  BP: 129/82  Pulse: 98  Temp:   Resp: 14    Intake/Output Summary (Last 24 hours) at 02/24/15 1241 Last data filed at 02/24/15 0800  Gross per 24 hour  Intake 893.75 ml  Output    150 ml  Net 743.75 ml   Filed Weights   02/21/15 1157 02/21/15 2058 02/24/15 0952  Weight: 59.875 kg (132 lb) 59.603 kg (131 lb 6.4 oz) 59.421 kg (131 lb)    Exam: General: NAD, looks comfortable Cardiovascular: regular rate and rhythm, S1, S2  Respiratory: CTA B Abdomen: soft, non tender, no distention , bowel sounds normal Musculoskeletal: No edema b/l   Data Reviewed: Basic Metabolic Panel:  Recent Labs Lab 02/21/15 1405 02/22/15 0614 02/23/15 0618 02/24/15 0619  NA 138 140 136 137  K 3.8 2.8* 3.3* 3.5  CL 105 107 101 103  CO2 26 26 27 25   GLUCOSE 101* 95 86 83  BUN 7 7 7 7   CREATININE 0.85 0.69 0.65 0.63  CALCIUM 9.2 8.1* 8.2* 8.4*  MG  --   --  1.7  --    Liver Function Tests:  Recent Labs Lab 02/21/15 1405  AST 16  ALT 13*  ALKPHOS 70  BILITOT 0.4  PROT 7.3  ALBUMIN 4.0    Recent Labs Lab 02/21/15 1405  LIPASE 27   CBC:  Recent Labs Lab 02/21/15 1405 02/22/15 0614 02/23/15 1747 02/24/15 0619  WBC 16.3* 13.3* 13.4* 10.8*  NEUTROABS 13.4*  --   --   --   HGB 13.7 12.7 13.2 12.5  HCT 40.6 37.5 38.6 36.9  MCV 95.3 94.0 94.4 94.9  PLT 231 213 200 191   Cardiac Enzymes:  BNP (last 3 results)  ProBNP (last 3 results)  CBG:  Studies: No results found.  Scheduled Meds: . bisacodyl  10 mg Rectal Once  . cefTRIAXone (ROCEPHIN)  IV  1 g Intravenous Q24H  . nicotine  21 mg Transdermal Daily  . pantoprazole (PROTONIX) IV  40 mg Intravenous Q12H  . sucralfate  1 g Oral TID WC & HS   Continuous Infusions: . sodium chloride Stopped (02/21/15 1626)  . sodium chloride 75 mL/hr at 02/24/15 5488    Principal Problem:   SBO (small bowel obstruction) Active Problems:   Crohn's disease   Tobacco  abuse   HTN (hypertension)   UTI (lower urinary tract infection)   Trichomonal infection   Gastric ulceration   Upper GI bleed    Time spent: 25 minutes   .Kathie Dike, M.D.  [facility] Pager [phone]. If 7PM-7AM, please contact night-coverage at [email or web address], password [facility] 02/24/2015, 12:41 PM  LOS: 2 days

## 2015-02-24 NOTE — Progress Notes (Signed)
No nausea or vomiting overnight. NG tube clamped. One green stool this morning Hemoglobin 12.5. Potassium of 3.5. Patient denies abdominal pain. Patient complains of throat irritation related to tube   Vital signs in last 24 hours: Temp:  [97.7 F (36.5 C)-98.9 F (37.2 C)] 97.7 F (36.5 C) (09/10 0539) Pulse Rate:  [76-95] 85 (09/10 0539) Resp:  [16-20] 17 (09/10 0539) BP: (141-162)/(82-88) 155/88 mmHg (09/10 0539) SpO2:  [95 %-97 %] 95 % (09/10 0539) Last BM Date: 02/23/15 General:    pleasant and cooperative in NAD.  Accompanied by her 2  daughters. NG tube clamp. Bloody residue within the tube. Abdomen:  Nondistended..  Normal bowel sounds, without guarding, and without rebound.  No mass or organomegaly. Extremities:  Without clubbing or edema.    Intake/Output from previous day: 09/09 0701 - 09/10 0700 In: 893.8 [I.V.:893.8] Out: 450 [Emesis/NG output:450] Intake/Output this shift: Total I/O In: -  Out: 150 [Urine:150]  Lab Results:  Recent Labs  02/22/15 0614 02/23/15 1747 02/24/15 0619  WBC 13.3* 13.4* 10.8*  HGB 12.7 13.2 12.5  HCT 37.5 38.6 36.9  PLT 213 200 191   BMET  Recent Labs  02/22/15 0614 02/23/15 0618 02/24/15 0619  NA 140 136 137  K 2.8* 3.3* 3.5  CL 107 101 103  CO2 26 27 25   GLUCOSE 95 86 83  BUN 7 7 7   CREATININE 0.69 0.65 0.63  CALCIUM 8.1* 8.2* 8.4*   LFT  Recent Labs  02/21/15 1405  PROT 7.3  ALBUMIN 4.0  AST 16  ALT 13*  ALKPHOS 70  BILITOT 0.4    Impression:  Ileocolonic Crohn's disease with superimposed SBO likely related to adhesions-appears to be resolving.  Upper GI bleed-hemoglobin remains normal. Bleeding seems to have ceased. I suspect NG tube trauma in the setting of Lovenox. However, to define further, diagnostic EGD needed.  Recommendations:  Diagnostic EGD today.  The risks, benefits, limitations, alternatives and imponderables have been reviewed with the patient. Potential for esophageal dilation, biopsy,  etc. have also been reviewed.  Questions have been answered. All parties agreeable.  Further recommendations to follow

## 2015-02-25 DIAGNOSIS — K50919 Crohn's disease, unspecified, with unspecified complications: Secondary | ICD-10-CM

## 2015-02-25 MED ORDER — ASPIRIN EC 81 MG PO TBEC: 81.0000 mg | DELAYED_RELEASE_TABLET | Freq: Every day | ORAL | Status: DC

## 2015-02-25 MED ORDER — POLYETHYLENE GLYCOL 3350 17 G PO PACK: 17.0000 g | PACK | Freq: Every day | ORAL | Status: DC | PRN

## 2015-02-25 MED ORDER — CIPROFLOXACIN HCL 250 MG PO TABS: 250.0000 mg | ORAL_TABLET | Freq: Two times a day (BID) | ORAL | Status: DC

## 2015-02-25 MED ORDER — SUCRALFATE 1 GM/10ML PO SUSP
1.0000 g | Freq: Three times a day (TID) | ORAL | Status: DC
Start: 2015-02-25 — End: 2015-07-03

## 2015-02-25 MED ORDER — PANTOPRAZOLE SODIUM 40 MG PO TBEC: 40.0000 mg | DELAYED_RELEASE_TABLET | Freq: Two times a day (BID) | ORAL | Status: DC

## 2015-02-25 NOTE — Discharge Summary (Signed)
Physician Discharge Summary  [name] NTI:[id number] DOB: 09/14/1945 DOA: 02/21/2015  PCP: Leonides Grills, MD  Admit date: 02/21/2015 Discharge date: 02/25/2015  Time spent: 35 minutes  Recommendations for Outpatient Follow-up:  1. Follow-up with general surgery as needed 2. Follow-up with gastroenterology in 2 weeks  Discharge Diagnoses:  Principal Problem:   SBO (small bowel obstruction) Active Problems:   Crohn's disease   Tobacco abuse   HTN (hypertension)   UTI (lower urinary tract infection)   Trichomonal infection   Gastric ulceration   Upper GI bleed   Discharge Condition: Improved  Diet recommendation: Low-salt  Filed Weights   02/21/15 1157 02/21/15 2058 02/24/15 0952  Weight: 59.875 kg (132 lb) 59.603 kg (131 lb 6.4 oz) 59.421 kg (131 lb)    History of present illness:  This patient presents to the hospital with complaints of abdominal pain. She did not have any bowel movements the day of admission. She had bouts of nausea and vomiting. She was evaluated in the emergency room and found to have a bowel obstruction and was admitted for further treatments.  Hospital Course:  1. SBO. Pt has a history of prior abdominal surgeries. She presented with abdominal pain and vomiting. CT abdomen indicated SBO. NG-tube was placed for decompression with improvement of her symptoms. She began having bowel movements and passing flatus. NG tube was discontinued and she tolerated clear liquids and subsequently a solid diet very well. She will follow-up with her general surgeon as needed. 2. Upper GI bleeding. Patient was found to have bloody material in her NG tube canister. She was seen by gastroenterology and underwent EGD which showed multiple gastric erosions and ulcerations. She was started on bid ppi and carafate. Hemoglobin is stable. Recommend to resume aspirin after one week 3. UTI. UA consistent with many bacteria and trichomonas. UC revealed Klebsiella pneumoniae. She  was treated with Rocephin and subsequently transitioned to Cipro. She received a 2 g dose of Flagyl to treat trichomonas 4. Crohn's Disease. Continue to follow up with GI for Crohn's. Continue Delzicol. 5. HTN. Stable on lisinopril 6. Tobacco disorder. Counseled on importance of smoking cessation. Provided nicotine patch.  Procedures: EGD: Multiple gastric erosions/ulcerations. Punctate gastric hemorrhages (only latter felt to be related to NG tube trauma). Areas of extensive erosion and ulceration likely pre-existing. Differential diagnosis includes Crohn's disease, ischemia. Would be quite unusual to be related to the trauma heaving alone. These were not Cameron lesions. H. pylori could be in the "background". Based on interview with the family, there is no NSAID exposure aside from 7. one aspirin daily.  Consultations:  Gastroenterology  Discharge Exam: Filed Vitals:   02/25/15 0554  BP: 103/64  Pulse: 72  Temp: 98.6 F (37 C)  Resp: 16    General: NAD Cardiovascular: S1 S2 RRR Respiratory: CTA B  Discharge Instructions   Discharge Instructions    Diet - low sodium heart healthy    Complete by:  As directed      Increase activity slowly    Complete by:  As directed           Discharge Medication List as of 02/25/2015 10:21 AM    START taking these medications   Details  ciprofloxacin (CIPRO) 250 MG tablet Take 1 tablet (250 mg total) by mouth 2 (two) times daily., Starting 02/25/2015, Until Discontinued, Normal    pantoprazole (PROTONIX) 40 MG tablet Take 1 tablet (40 mg total) by mouth 2 (two) times daily., Starting 02/25/2015, Until Discontinued, Normal  polyethylene glycol (MIRALAX) packet Take 17 g by mouth daily as needed for mild constipation., Starting 02/25/2015, Until Discontinued, Normal    sucralfate (CARAFATE) 1 GM/10ML suspension Take 10 mLs (1 g total) by mouth 4 (four) times daily -  with meals and at bedtime., Starting 02/25/2015, Until  Discontinued, Normal      CONTINUE these medications which have CHANGED   Details  aspirin EC 81 MG tablet Take 1 tablet (81 mg total) by mouth daily. Resume in 1 week, Starting 02/25/2015, Until Discontinued, No Print      CONTINUE these medications which have NOT CHANGED   Details  acetaminophen (TYLENOL) 500 MG tablet Take 1,000 mg by mouth daily as needed for mild pain or moderate pain., Until Discontinued, Historical Med    DELZICOL 400 MG CPDR DR capsule TAKE 2 CAPSULES BY MOUTH TWICE DAILY., Normal    lisinopril (PRINIVIL,ZESTRIL) 20 MG tablet Take 10 mg by mouth daily., Until Discontinued, Historical Med    ondansetron (ZOFRAN) 4 MG tablet Take 4 mg by mouth every 8 (eight) hours as needed for nausea or vomiting. , Until Discontinued, Historical Med    diphenhydrAMINE (BENADRYL) 25 mg capsule Take 25 mg by mouth every 6 (six) hours as needed for itching., Until Discontinued, Historical Med       Allergies  Allergen Reactions  . Flagyl [Metronidazole Hcl] Other (See Comments)    "makes me feel whoosy and weird"  . Other     Unknown antibiotic   Follow-up Information    Follow up with REHMAN,NAJEEB U, MD. Schedule an appointment as soon as possible for a visit in 2 weeks.   Specialty:  Gastroenterology   Contact information:   [place], [address] [place] [address] [phone]        The results of significant diagnostics from this hospitalization (including imaging, microbiology, ancillary and laboratory) are listed below for reference.    Significant Diagnostic Studies: Dg Chest 2 View  02/21/2015   CLINICAL DATA:  Left upper quadrant pain.  Smoker  EXAM: CHEST  2 VIEW  COMPARISON:  04/08/2013  FINDINGS: Heart size and vascularity normal. Mild pulmonary hyperinflation. Mild apical scarring. Negative for pneumonia. Negative for mass or adenopathy. No pleural effusion.  IMPRESSION: Mild COPD without acute abnormality.   Electronically Signed   By: Franchot Gallo M.D.   On: 02/21/2015 15:38   Ct Abdomen Pelvis W Contrast  02/21/2015   CLINICAL DATA:  Left upper quadrant and flank pain with nausea for 2 days. History of Crohn's disease.  EXAM: CT ABDOMEN AND PELVIS WITH CONTRAST  TECHNIQUE: Multidetector CT imaging of the abdomen and pelvis was performed using the standard protocol following bolus administration of intravenous contrast.  CONTRAST:  34m OMNIPAQUE IOHEXOL 300 MG/ML SOLN, 1059mOMNIPAQUE IOHEXOL 300 MG/ML SOLN  COMPARISON:  04/13/2014  FINDINGS: Lower chest:  Lung bases are clear.  Hepatobiliary: 3 mm too small to characterize lateral segment left hepatic lobe hypodensity image 16 stable. Probable focal fat, medial segment left hepatic lobe. Mild prominence of the intrahepatic bile ducts is identified. Stable degree of extrahepatic common duct dilatation measuring 1.4 cm at its midportion with evidence of cholecystectomy. Tapering to the ampulla is identified.  Pancreas: Pancreatic calcification is noted likely indicating prior pancreatitis. No peripancreatic fluid or hypo enhancement or mass is identified.  Spleen: Normal  Adrenals/Urinary Tract: Adrenal glands are normal in appearance. Bilateral renal cortical cysts and too small to characterize hypodense lesions are reidentified. An area  of focal cortical scarring or motion artifact noted image 32.  Stomach/Bowel: Small hiatal hernia. There is dilatation of multiple loops of small bowel beginning at the jejunum and extending to a focal transition point in the ileum at the level of ileal colic anastomosis, image 42 also see image 32 series 3 and sagittal image 33 series 4. Trace stranding is identified in the right lower quadrant around loops of small bowel. Maximal small bowel diameter is 3.8 cm. No bowel wall thickening with the exception of a short segment of the right colon, image 40. Distal colonic tics are identified without evidence for diverticulitis.  Vascular/Lymphatic: Moderate atheromatous  aortic calcification without aneurysm. No lymphadenopathy.  Other: Uterus and ovaries appear normal. Small pelvic free fluid. The bladder is normal. Evidence of ventral abdominal wall hernia repair. No complicating features identified.  Musculoskeletal: No acute osseous abnormality. Lumbar spine disc degenerative change and mild curvature centered at L2 noted.  IMPRESSION: Small bowel obstruction with focal transition point at the level of the terminal ileum just prior to the ileocolic anastomosis. This may indicate stricture. Mass is considered less likely.  Stable degree of common bile duct dilatation status post cholecystectomy.  Interval ventral abdominal wall hernia repair without complicating feature currently.   Electronically Signed   By: Conchita Paris M.D.   On: 02/21/2015 18:19   Dg Chest Port 1 View  02/21/2015   CLINICAL DATA:  NG tube placement.  EXAM: PORTABLE CHEST - 1 VIEW  COMPARISON:  PA and lateral chest earlier today.  FINDINGS: NG tube is in place with the side port and tip in the stomach. The lungs are clear. Heart size is normal. No pneumothorax or pleural effusion.  IMPRESSION: NG tube in good position.  No acute abnormality.   Electronically Signed   By: Inge Rise M.D.   On: 02/21/2015 19:40    Microbiology: Recent Results (from the past 240 hour(s))  Urine culture     Status: None   Collection Time: 02/21/15  1:05 PM  Result Value Ref Range Status   Specimen Description URINE, CLEAN CATCH  Final   Special Requests NONE  Final   Culture   Final    >=100,000 COLONIES/mL KLEBSIELLA PNEUMONIAE Performed at [facility]    Report Status 02/23/2015 FINAL  Final   Organism ID, Bacteria KLEBSIELLA PNEUMONIAE  Final      Susceptibility   Klebsiella pneumoniae - MIC*    AMPICILLIN 16 RESISTANT Resistant     CEFAZOLIN <=4 SENSITIVE Sensitive     CEFTRIAXONE <=1 SENSITIVE Sensitive     CIPROFLOXACIN <=0.25 SENSITIVE Sensitive     GENTAMICIN <=1 SENSITIVE  Sensitive     IMIPENEM <=0.25 SENSITIVE Sensitive     NITROFURANTOIN 32 SENSITIVE Sensitive     TRIMETH/SULFA <=20 SENSITIVE Sensitive     AMPICILLIN/SULBACTAM 4 SENSITIVE Sensitive     PIP/TAZO <=4 SENSITIVE Sensitive     * >=100,000 COLONIES/mL KLEBSIELLA PNEUMONIAE     Labs: Basic Metabolic Panel:  Recent Labs Lab 02/21/15 1405 02/22/15 0614 02/23/15 0618 02/24/15 0619  NA 138 140 136 137  K 3.8 2.8* 3.3* 3.5  CL 105 107 101 103  CO2 26 26 27 25   GLUCOSE 101* 95 86 83  BUN 7 7 7 7   CREATININE 0.85 0.69 0.65 0.63  CALCIUM 9.2 8.1* 8.2* 8.4*  MG  --   --  1.7  --    Liver Function Tests:  Recent Labs Lab 02/21/15 1405  AST 16  ALT 13*  ALKPHOS 70  BILITOT 0.4  PROT 7.3  ALBUMIN 4.0    Recent Labs Lab 02/21/15 1405  LIPASE 27   No results for input(s): AMMONIA in the last 168 hours. CBC:  Recent Labs Lab 02/21/15 1405 02/22/15 0614 02/23/15 1747 02/24/15 0619  WBC 16.3* 13.3* 13.4* 10.8*  NEUTROABS 13.4*  --   --   --   HGB 13.7 12.7 13.2 12.5  HCT 40.6 37.5 38.6 36.9  MCV 95.3 94.0 94.4 94.9  PLT 231 213 200 191   Cardiac Enzymes: No results for input(s): CKTOTAL, CKMB, CKMBINDEX, TROPONINI in the last 168 hours. BNP: BNP (last 3 results) No results for input(s): BNP in the last 8760 hours.  ProBNP (last 3 results) No results for input(s): PROBNP in the last 8760 hours.  CBG: No results for input(s): GLUCAP in the last 168 hours.     Signed:  MEMON,JEHANZEB  [facility] 02/25/2015, 5:27 PM

## 2015-02-25 NOTE — Progress Notes (Signed)
Patient says she feels "great". Tolerating diet. No nausea vomiting or abdominal pain 3 normal-appearing BMs overnight wants to go home.  Vital signs in last 24 hours: Temp:  [97.8 F (36.6 C)-98.6 F (37 C)] 98.6 F (37 C) (09/11 0554) Pulse Rate:  [72-104] 72 (09/11 0554) Resp:  [14-21] 16 (09/11 0554) BP: (103-161)/(55-94) 103/64 mmHg (09/11 0554) SpO2:  [96 %-100 %] 98 % (09/11 0554) Weight:  [131 lb (59.421 kg)] 131 lb (59.421 kg) (09/10 0952) Last BM Date: 02/25/15 General:   Alert,  , pleasant and cooperative in NAD Abdomen:  Nondistended. Positive bowel sounds soft and nontender Extremities:  Without clubbing or edema.    Intake/Output from previous day: 09/10 0701 - 09/11 0700 In: 720 [P.O.:720] Out: 700 [Urine:700] Intake/Output this shift: Total I/O In: 240 [P.O.:240] Out: -   Lab Results:  Recent Labs  02/23/15 1747 02/24/15 0619  WBC 13.4* 10.8*  HGB 13.2 12.5  HCT 38.6 36.9  PLT 200 191   BMET  Recent Labs  02/23/15 0618 02/24/15 0619  NA 136 137  K 3.3* 3.5  CL 101 103  CO2 27 25  GLUCOSE 86 83  BUN 7 7  CREATININE 0.65 0.63  CALCIUM 8.2* 8.4*     Impression:  SBO resolved. Upper GI bleed secondary to both gastric erosion/ulceration and to a lesser extent, NG tube trauma.  Ileocolonic Crohn's disease-stable  Recommendations:    Continue low residue diet. Continue Protonix 40 mg twice a day. Continue Carafate 2 weeks. Continue Delzicol.  Follow-up on pathology. Will arrange appointment with Dr. Laural Golden in about 2-3 weeks.  Patient should avoid all non-steroidal agents.

## 2015-02-25 NOTE — Progress Notes (Signed)
1030 d/c instructions and paperwork given to patient. Patient aware to pick up Rxs at [facility]. IV catheter removed from RIGHT AC, catheter intact, no s/s of infection noted, patient tolerated well w/no c/o pain or discomfort noted. Patient's family here to pick up patient and take her home.

## 2015-02-26 ENCOUNTER — Telehealth: Payer: Self-pay | Admitting: Internal Medicine

## 2015-02-26 NOTE — Telephone Encounter (Signed)
Forwarded to tammy

## 2015-02-26 NOTE — Telephone Encounter (Signed)
Patient called our office this morning saying her Rx for her stomach ulcers would cost her over $195 and she needed something else that her insurance would cover that wouldn't be so expensive. She uses [facility] and can be reached at [phone]. Later did I find out that she had called the wrong office. She is a NUR patient and has a FU OV with Deberah Castle scheduled.

## 2015-02-27 ENCOUNTER — Encounter (HOSPITAL_COMMUNITY): Payer: Self-pay | Admitting: Internal Medicine

## 2015-02-27 DIAGNOSIS — B351 Tinea unguium: Secondary | ICD-10-CM | POA: Diagnosis not present

## 2015-02-27 DIAGNOSIS — I739 Peripheral vascular disease, unspecified: Secondary | ICD-10-CM | POA: Diagnosis not present

## 2015-02-27 NOTE — Telephone Encounter (Signed)
Tammy,   Can try her on Omeprazole 64m twice a day #60 with 3 refill.

## 2015-02-27 NOTE — Telephone Encounter (Signed)
[name] - the patient is to see you on 03/13/15. Dr.Rehman did put her on Pantoprazole and patient is stating that this is to expensive. Would you please advise something else for her, at least until you see her?

## 2015-02-28 ENCOUNTER — Encounter: Payer: Self-pay | Admitting: Internal Medicine

## 2015-02-28 ENCOUNTER — Other Ambulatory Visit (INDEPENDENT_AMBULATORY_CARE_PROVIDER_SITE_OTHER): Payer: Self-pay | Admitting: *Deleted

## 2015-02-28 MED ORDER — OMEPRAZOLE 20 MG PO CPDR: 20.0000 mg | DELAYED_RELEASE_CAPSULE | Freq: Two times a day (BID) | ORAL | Status: DC

## 2015-02-28 NOTE — Telephone Encounter (Signed)
PPI changed due to the cost of the Pantoprazole. It was to expensive for the patient. Per Deberah Castle, NP may change to Omeprazole 20 mg BID. Plan is to reevaluate at the time of her OV 03/13/15.

## 2015-03-03 SURGERY — EGD (ESOPHAGOGASTRODUODENOSCOPY)
Anesthesia: Moderate Sedation

## 2015-03-13 ENCOUNTER — Ambulatory Visit (INDEPENDENT_AMBULATORY_CARE_PROVIDER_SITE_OTHER): Payer: Medicare Other | Admitting: Internal Medicine

## 2015-03-13 ENCOUNTER — Encounter (INDEPENDENT_AMBULATORY_CARE_PROVIDER_SITE_OTHER): Payer: Self-pay | Admitting: Internal Medicine

## 2015-03-13 VITALS — BP 124/64 | HR 64 | Temp 98.2°F | Ht 62.0 in | Wt 129.0 lb

## 2015-03-13 DIAGNOSIS — K5090 Crohn's disease, unspecified, without complications: Secondary | ICD-10-CM

## 2015-03-13 LAB — C-REACTIVE PROTEIN: CRP: <0.5 mg/dL (ref <0.60)

## 2015-03-13 LAB — CBC WITH DIFFERENTIAL/PLATELET
BASOS ABS: 0 10*3/uL (ref 0.0–0.1)
Basophils Relative: 0 % (ref 0–1)
EOS ABS: 0.1 10*3/uL (ref 0.0–0.7)
EOS PCT: 1 % (ref 0–5)
HEMATOCRIT: 38 % (ref 36.0–46.0)
Hemoglobin: 12.4 g/dL (ref 12.0–15.0)
LYMPHS ABS: 2 10*3/uL (ref 0.7–4.0)
LYMPHS PCT: 22 % (ref 12–46)
MCH: 30.9 pg (ref 26.0–34.0)
MCHC: 32.6 g/dL (ref 30.0–36.0)
MCV: 94.8 fL (ref 78.0–100.0)
MONOS PCT: 6 % (ref 3–12)
MPV: 9.3 fL (ref 8.6–12.4)
Monocytes Absolute: 0.6 10*3/uL (ref 0.1–1.0)
NEUTROS PCT: 71 % (ref 43–77)
Neutro Abs: 6.6 10*3/uL (ref 1.7–7.7)
Platelets: 258 10*3/uL (ref 150–400)
RBC: 4.01 MIL/uL (ref 3.87–5.11)
RDW: 13.2 % (ref 11.5–15.5)
WBC: 9.3 10*3/uL (ref 4.0–10.5)

## 2015-03-13 NOTE — Patient Instructions (Addendum)
CBC and CRP today . OV in 6 months. Patient advised to quit smoking.

## 2015-03-13 NOTE — Progress Notes (Signed)
Subjective:    Patient ID: [name], female    DOB: 03/29/1946, 69 y.o.   MRN: [id number]  HPI Here today for f/u after recent admission 1st of this month to [facility].  She has a hx of small bowel Crohn's disease and is maintained on oral mesalamine.  She developed mid to lower abdominal pain and nausea and presented to the ED. She was found to have a SBO and hospitalized. NG tube was place. She was also found to have a UTI and treated with Rocephine.  After NG placement she was noted to have grossly coffee ground emesis of approximately 400cc.  She underwent an EGD by Dr. Gala Romney which revealed.Multiple gastric erosions/ulcerations.  Punctate gastric hemorrhages (only later felt to be related to NG tube trauma). Areas of extensive erosion and ulceration likely pre-existing. Differential diagnosis includes Crohn's diseases, ischemia. There were no Cameron lesions.  Biopsy: Focal ischemia with associated acute inflammation and ulceration. Negative for H. Pylori, No intestinal metaplasia, dysplasia, or malignancy.   She tells me she is doing okay. Appetite is good. She is having a BM  (2-3) a day. No melena or BRRB. She denies having any abdominal pain.  No NSAIDs. She tells me she 100% better.  02/21/2015 CT abdomen/pelvis with CM: abdominal pain:  IMPRESSION: Small bowel obstruction with focal transition point at the level of the terminal ileum just prior to the ileocolic anastomosis. This may indicate stricture. Mass is considered less likely.  Stable degree of common bile duct dilatation status post cholecystectomy.  Interval ventral abdominal wall hernia repair without complicating  01/02/9469 CRP: 1.6.  CBC    Component Value Date/Time   WBC 10.8* 02/24/2015 0619   RBC 3.89 02/24/2015 0619   HGB 12.5 02/24/2015 0619   HCT 36.9 02/24/2015 0619   PLT 191 02/24/2015 0619   MCV 94.9 02/24/2015 0619   MCH 32.1 02/24/2015 0619   MCHC 33.9 02/24/2015 0619   RDW 12.4 02/24/2015 0619   LYMPHSABS 2.1 02/21/2015 1405   MONOABS 0.6 02/21/2015 1405   EOSABS 0.1 02/21/2015 1405   BASOSABS 0.0 02/21/2015 1405         Review of Systems Past Medical History  Diagnosis Date  . Crohn's disease   . Hypertension   . Stroke   . Arthritis   . Sciatica of right side   . Degenerative disc disease, lumbar   . Ventral hernia     Past Surgical History  Procedure Laterality Date  . Cholecystectomy      2008  . Carotid endarterectomy      2004  . Hemicolectomy      right  . Tubal ligation    . Incisional hernia repair N/A 05/20/2013    Procedure: [name] HERNIORRHAPHY WITH MESH;  Surgeon: Jamesetta So, MD;  Location: [facility];  Service: General;  Laterality: N/A;  . Insertion of mesh N/A 05/20/2013    Procedure: INSERTION OF MESH;  Surgeon: Jamesetta So, MD;  Location: [facility];  Service: General;  Laterality: N/A;  . Incisional hernia repair N/A 05/19/2014    Procedure: RECURRENT HERNIA REPAIR INCISIONAL;  Surgeon: Jamesetta So, MD;  Location: [facility];  Service: General;  Laterality: N/A;  . Insertion of mesh N/A 05/19/2014    Procedure: INSERTION OF MESH;  Surgeon: Jamesetta So, MD;  Location: [facility];  Service: General;  Laterality: N/A;  . Esophagogastroduodenoscopy N/A 02/24/2015    Procedure: ESOPHAGOGASTRODUODENOSCOPY (EGD);  Surgeon: Daneil Dolin, MD;  Location: [facility];  Service: Endoscopy;  Laterality: N/A;    Allergies  Allergen Reactions  . Flagyl [Metronidazole Hcl] Other (See Comments)    "makes me feel whoosy and weird"  . Other     Unknown antibiotic    Current Outpatient Prescriptions on File Prior to Visit  Medication Sig Dispense Refill  . acetaminophen (TYLENOL) 500 MG tablet Take 1,000 mg by mouth daily as needed for mild pain or moderate pain.    Marland Kitchen aspirin EC 81 MG tablet Take 1 tablet (81 mg total) by mouth daily. Resume in 1 week    . DELZICOL 400 MG CPDR DR capsule TAKE 2 CAPSULES BY MOUTH TWICE DAILY. 120 capsule 4  .  diphenhydrAMINE (BENADRYL) 25 mg capsule Take 25 mg by mouth every 6 (six) hours as needed for itching.    Marland Kitchen lisinopril (PRINIVIL,ZESTRIL) 20 MG tablet Take 10 mg by mouth daily.    Marland Kitchen omeprazole (PRILOSEC) 20 MG capsule Take 1 capsule (20 mg total) by mouth 2 (two) times daily before a meal. 60 capsule 3  . ondansetron (ZOFRAN) 4 MG tablet Take 4 mg by mouth every 8 (eight) hours as needed for nausea or vomiting.     . polyethylene glycol (MIRALAX) packet Take 17 g by mouth daily as needed for mild constipation. 14 each 0  . sucralfate (CARAFATE) 1 GM/10ML suspension Take 10 mLs (1 g total) by mouth 4 (four) times daily -  with meals and at bedtime. 420 mL 0  . ciprofloxacin (CIPRO) 250 MG tablet Take 1 tablet (250 mg total) by mouth 2 (two) times daily. (Patient not taking: Reported on 03/13/2015) 6 tablet 0  . pantoprazole (PROTONIX) 40 MG tablet Take 1 tablet (40 mg total) by mouth 2 (two) times daily. (Patient not taking: Reported on 03/13/2015) 60 tablet 2   No current facility-administered medications on file prior to visit.        Objective:   Physical Exam Blood pressure 124/64, pulse 64, temperature 98.2 F (36.8 C), height 5\' 2"  (1.575 m), weight 129 lb (58.514 kg). Alert and oriented. Skin warm and dry. Oral mucosa is moist.   . Sclera anicteric, conjunctivae is pink. Thyroid not enlarged. No cervical lymphadenopathy. Lungs clear. Heart regular rate and rhythm.  Abdomen is soft. Bowel sounds are positive. No hepatomegaly. No abdominal masses felt. No tenderness.  No edema to lower extremities.          Assessment & Plan:  Small bowel Crohn's. She seems to be in remission. Her BMs are normal.  She denies any rectal bleeding or hematemesis.  She says she feels 100% better. Will get a CBC and CRP today. She is to continue the Delizol. OV in 6 months.

## 2015-04-04 DIAGNOSIS — Z23 Encounter for immunization: Secondary | ICD-10-CM | POA: Diagnosis not present

## 2015-04-04 DIAGNOSIS — Z6823 Body mass index (BMI) 23.0-23.9, adult: Secondary | ICD-10-CM | POA: Diagnosis not present

## 2015-04-04 DIAGNOSIS — Z1389 Encounter for screening for other disorder: Secondary | ICD-10-CM | POA: Diagnosis not present

## 2015-04-04 DIAGNOSIS — K5090 Crohn's disease, unspecified, without complications: Secondary | ICD-10-CM | POA: Diagnosis not present

## 2015-04-08 ENCOUNTER — Encounter (HOSPITAL_COMMUNITY): Payer: Self-pay | Admitting: *Deleted

## 2015-04-08 ENCOUNTER — Inpatient Hospital Stay (HOSPITAL_COMMUNITY)
Admission: EM | Admit: 2015-04-08 | Discharge: 2015-04-12 | DRG: 470 | Disposition: A | Discharge status: Skilled Nursing Facility | Payer: Medicare Other | Attending: Family Medicine | Admitting: Family Medicine

## 2015-04-08 DIAGNOSIS — W010XXA Fall on same level from slipping, tripping and stumbling without subsequent striking against object, initial encounter: Secondary | ICD-10-CM | POA: Diagnosis present

## 2015-04-08 DIAGNOSIS — R059 Cough, unspecified: Secondary | ICD-10-CM

## 2015-04-08 DIAGNOSIS — M1990 Unspecified osteoarthritis, unspecified site: Secondary | ICD-10-CM | POA: Diagnosis not present

## 2015-04-08 DIAGNOSIS — Z09 Encounter for follow-up examination after completed treatment for conditions other than malignant neoplasm: Secondary | ICD-10-CM

## 2015-04-08 DIAGNOSIS — E876 Hypokalemia: Secondary | ICD-10-CM | POA: Diagnosis not present

## 2015-04-08 DIAGNOSIS — R05 Cough: Secondary | ICD-10-CM

## 2015-04-08 DIAGNOSIS — Z8673 Personal history of transient ischemic attack (TIA), and cerebral infarction without residual deficits: Secondary | ICD-10-CM

## 2015-04-08 DIAGNOSIS — M25552 Pain in left hip: Secondary | ICD-10-CM | POA: Diagnosis not present

## 2015-04-08 DIAGNOSIS — Z720 Tobacco use: Secondary | ICD-10-CM | POA: Diagnosis present

## 2015-04-08 DIAGNOSIS — S72009A Fracture of unspecified part of neck of unspecified femur, initial encounter for closed fracture: Secondary | ICD-10-CM | POA: Diagnosis present

## 2015-04-08 DIAGNOSIS — Z8249 Family history of ischemic heart disease and other diseases of the circulatory system: Secondary | ICD-10-CM

## 2015-04-08 DIAGNOSIS — R0902 Hypoxemia: Secondary | ICD-10-CM

## 2015-04-08 DIAGNOSIS — M25562 Pain in left knee: Secondary | ICD-10-CM | POA: Diagnosis not present

## 2015-04-08 DIAGNOSIS — R52 Pain, unspecified: Secondary | ICD-10-CM | POA: Diagnosis not present

## 2015-04-08 DIAGNOSIS — F17200 Nicotine dependence, unspecified, uncomplicated: Secondary | ICD-10-CM | POA: Diagnosis present

## 2015-04-08 DIAGNOSIS — R509 Fever, unspecified: Secondary | ICD-10-CM

## 2015-04-08 DIAGNOSIS — S72012A Unspecified intracapsular fracture of left femur, initial encounter for closed fracture: Secondary | ICD-10-CM | POA: Diagnosis not present

## 2015-04-08 DIAGNOSIS — S199XXA Unspecified injury of neck, initial encounter: Secondary | ICD-10-CM | POA: Diagnosis not present

## 2015-04-08 DIAGNOSIS — S72002A Fracture of unspecified part of neck of left femur, initial encounter for closed fracture: Secondary | ICD-10-CM | POA: Diagnosis present

## 2015-04-08 DIAGNOSIS — M25569 Pain in unspecified knee: Secondary | ICD-10-CM | POA: Diagnosis not present

## 2015-04-08 DIAGNOSIS — S72019A Unspecified intracapsular fracture of unspecified femur, initial encounter for closed fracture: Secondary | ICD-10-CM | POA: Diagnosis present

## 2015-04-08 DIAGNOSIS — M79605 Pain in left leg: Secondary | ICD-10-CM | POA: Diagnosis not present

## 2015-04-08 DIAGNOSIS — I10 Essential (primary) hypertension: Secondary | ICD-10-CM | POA: Diagnosis present

## 2015-04-08 DIAGNOSIS — D62 Acute posthemorrhagic anemia: Secondary | ICD-10-CM | POA: Insufficient documentation

## 2015-04-08 DIAGNOSIS — M25559 Pain in unspecified hip: Secondary | ICD-10-CM | POA: Diagnosis not present

## 2015-04-08 DIAGNOSIS — Z823 Family history of stroke: Secondary | ICD-10-CM

## 2015-04-08 DIAGNOSIS — S72092A Other fracture of head and neck of left femur, initial encounter for closed fracture: Secondary | ICD-10-CM | POA: Diagnosis not present

## 2015-04-08 DIAGNOSIS — K5090 Crohn's disease, unspecified, without complications: Secondary | ICD-10-CM | POA: Diagnosis present

## 2015-04-08 NOTE — ED Provider Notes (Signed)
TIME SEEN: 11:49 PM  CHIEF COMPLAINT: fall HPI:  HPI Comments: [name] is a 69 y.o. Female with hx of HTN, Crohn's disease, stroke brought in by [facility] who presents to the Emergency Department complaining of a fall onset PTA. Pt states that she went outside to check on her cats, slipped and fell, landing on her left side. She reports pain to her left hip and knee that worsens with movement and lower back pain. She denies neck pain, hitting head or LOC. She denies being on blood thinners. Pt arrived to ED placed in C-Collar.  Pt denies ambulating with cane or walker. Denies chest or abdominal pain.  PCP: Glo Herring., MD   ROS: See HPI Constitutional: no fever  Eyes: no drainage  ENT: no runny nose   Cardiovascular:  no chest pain  Resp: no SOB  GI: no vomiting GU: no dysuria Integumentary: no rash  Allergy: no hives  Musculoskeletal: no leg swelling  Neurological: no slurred speech ROS otherwise negative  PAST MEDICAL HISTORY/PAST SURGICAL HISTORY:  Past Medical History  Diagnosis Date  . Crohn's disease ([place])   . Hypertension   . Stroke ([place])   . Arthritis   . Sciatica of right side   . Degenerative disc disease, lumbar   . Ventral hernia     MEDICATIONS:  Prior to Admission medications   Medication Sig Start Date End Date Taking? Authorizing Provider  acetaminophen (TYLENOL) 500 MG tablet Take 1,000 mg by mouth daily as needed for mild pain or moderate pain.    Historical Provider, MD  aspirin EC 81 MG tablet Take 1 tablet (81 mg total) by mouth daily. Resume in 1 week 02/25/15   Kathie Dike, MD  ciprofloxacin (CIPRO) 250 MG tablet Take 1 tablet (250 mg total) by mouth 2 (two) times daily. Patient not taking: Reported on 03/13/2015 02/25/15   Kathie Dike, MD  DELZICOL 400 MG CPDR DR capsule TAKE 2 CAPSULES BY MOUTH TWICE DAILY. 01/05/15   Butch Penny, NP  diphenhydrAMINE (BENADRYL) 25 mg capsule Take 25 mg by mouth every 6 (six) hours as needed for itching.     Historical Provider, MD  lisinopril (PRINIVIL,ZESTRIL) 20 MG tablet Take 10 mg by mouth daily.    Historical Provider, MD  omeprazole (PRILOSEC) 20 MG capsule Take 1 capsule (20 mg total) by mouth 2 (two) times daily before a meal. 02/28/15   Butch Penny, NP  ondansetron (ZOFRAN) 4 MG tablet Take 4 mg by mouth every 8 (eight) hours as needed for nausea or vomiting.     Historical Provider, MD  pantoprazole (PROTONIX) 40 MG tablet Take 1 tablet (40 mg total) by mouth 2 (two) times daily. Patient not taking: Reported on 03/13/2015 02/25/15   Kathie Dike, MD  polyethylene glycol [facility]) packet Take 17 g by mouth daily as needed for mild constipation. 02/25/15   Kathie Dike, MD  sucralfate (CARAFATE) 1 GM/10ML suspension Take 10 mLs (1 g total) by mouth 4 (four) times daily -  with meals and at bedtime. 02/25/15   Kathie Dike, MD    ALLERGIES:  Allergies  Allergen Reactions  . Flagyl [Metronidazole Hcl] Other (See Comments)    "makes me feel whoosy and weird"  . Other     Unknown antibiotic and unknown pain medications     SOCIAL HISTORY:  Social History  Substance Use Topics  . Smoking status: Current Every Day Smoker -- 1.00 packs/day for 30 years  . Smokeless tobacco: Never  Used     Comment: 1-2 pack day x 20 yrs.  . Alcohol Use: No     Comment: one every 2 -3 months.    FAMILY HISTORY: Family History  Problem Relation Age of Onset  . GI problems Father   . Stroke Brother   . Heart attack Mother   . Hypotension Sister     EXAM: BP 141/85 mmHg  Pulse 89  Temp(Src) 97.9 F (36.6 C) (Oral)  Resp 18  Ht 5' 2"  (1.575 m)  Wt 126 lb (57.153 kg)  BMI 23.04 kg/m2  SpO2 94% CONSTITUTIONAL: Alert and oriented and responds appropriately to questions. Elderly, appears uncomfortable. GCS 15 HEAD: Normocephalic; atraumatic EYES: Conjunctivae clear, PERRL, EOMI ENT: normal nose; no rhinorrhea; moist mucous membranes; pharynx without lesions noted; no dental injury; no  septal hematoma NECK: Supple, no meningismus, no LAD; no midline spinal tenderness, step-off or deformity, cervical collar in place CARD: RRR; S1 and S2 appreciated; no murmurs, no clicks, no rubs, no gallops RESP: Normal chest excursion without splinting or tachypnea; breath sounds clear and equal bilaterally; no wheezes, no rhonchi, no rales; no hypoxia or respiratory distress CHEST:  chest wall stable, no crepitus or ecchymosis or deformity, nontender to palpation ABD/GI: Normal bowel sounds; non-distended; soft, non-tender, no rebound, no guarding PELVIS:  stable, tender to palpation over the left hip and left leg is shortened and externally rotated BACK:  The back appears normal and is non-tender to palpation, there is no CVA tenderness; no midline spinal tenderness, step-off or deformity EXT: Tender over the left knee with an area of swelling to the medial aspect, unable to bend the knee secondary to pain, unable to move the hip secondary to pain on the left side, otherwise Normal ROM in all joints; otherwise extremity is are non-tender to palpation; no edema; normal capillary refill; no cyanosis, 2+ DP pulses bilaterally SKIN: Normal color for age and race; warm, small skin tear noted left elbow NEURO: Moves all extremities equally, sensation to light touch intact diffusely, cranial nerves II through XII intact PSYCH: The patient's mood and manner are appropriate. Grooming and personal hygiene are appropriate.  MEDICAL DECISION MAKING: Patient here with mechanical fall. Complaint of left knee and hip pain. We'll obtain x-rays of left knee and hip as well as her chest. Concern for possible hip fracture on exam. Will also obtain a CT of her head and cervical spine given her age and that she has a distracting injury. We'll obtain screening labs, EKG. We'll give pain medication and keep her nothing by mouth. Patient states she last ate at 8 PM last night.  ED PROGRESS:  11:58 PM-Discussed  treatment plan which includes labs, x-ray, CT scan, and pain medication with pt at bedside and pt agreed to plan.   3:36 AM  - patient's labs show mild leukocytosis which is likely reactive. Head and cervical spine CT showed no acute injury. Hip x-ray shows displaced subcapital fracture of the left frontal neck with mild comminution. No fracture of the knee. Chest x-ray clear. We'll discuss with orthopedics on call Dr. Aline Brochure and admit to medicine. I have cleared her C-spine and removed her c-collar.   3:59 AM  D/w Dr Aline Brochure who will see pt in consult in AM.  4:12 AM  D/w Dr. Darrick Meigs for admission to medical bed.    EKG Interpretation  Date/Time:  Monday April 09 2015 00:34:01 EDT Ventricular Rate:  90 PR Interval:  159 QRS Duration: 86 QT Interval:  358 QTC Calculation: 438 R Axis:   69 Text Interpretation:  Sinus rhythm No significant change since last tracing in 2014 Confirmed by WARD,  DO, KRISTEN [phone]) on 04/09/2015 12:47:42 AM         [place], DO 04/09/15 [id number]

## 2015-04-08 NOTE — ED Notes (Signed)
Pt brought in by [facility] for c/o fall; pt states she was outside checking on her cats and fell and is having pain to left hip

## 2015-04-09 ENCOUNTER — Emergency Department (HOSPITAL_COMMUNITY): Payer: Medicare Other

## 2015-04-09 ENCOUNTER — Inpatient Hospital Stay (HOSPITAL_COMMUNITY): Payer: Medicare Other

## 2015-04-09 ENCOUNTER — Inpatient Hospital Stay (HOSPITAL_COMMUNITY): Payer: Medicare Other | Admitting: Anesthesiology

## 2015-04-09 ENCOUNTER — Encounter (HOSPITAL_COMMUNITY): Payer: Self-pay | Admitting: *Deleted

## 2015-04-09 ENCOUNTER — Encounter (HOSPITAL_COMMUNITY)
Admission: EM | Disposition: A | Discharge status: Skilled Nursing Facility | Payer: Self-pay | Source: Home / Self Care | Attending: Internal Medicine

## 2015-04-09 DIAGNOSIS — S72009A Fracture of unspecified part of neck of unspecified femur, initial encounter for closed fracture: Secondary | ICD-10-CM | POA: Diagnosis present

## 2015-04-09 DIAGNOSIS — R278 Other lack of coordination: Secondary | ICD-10-CM | POA: Diagnosis not present

## 2015-04-09 DIAGNOSIS — Z471 Aftercare following joint replacement surgery: Secondary | ICD-10-CM | POA: Diagnosis not present

## 2015-04-09 DIAGNOSIS — K5090 Crohn's disease, unspecified, without complications: Secondary | ICD-10-CM | POA: Diagnosis not present

## 2015-04-09 DIAGNOSIS — Z8249 Family history of ischemic heart disease and other diseases of the circulatory system: Secondary | ICD-10-CM | POA: Diagnosis not present

## 2015-04-09 DIAGNOSIS — S199XXA Unspecified injury of neck, initial encounter: Secondary | ICD-10-CM | POA: Diagnosis not present

## 2015-04-09 DIAGNOSIS — S72012A Unspecified intracapsular fracture of left femur, initial encounter for closed fracture: Secondary | ICD-10-CM | POA: Diagnosis not present

## 2015-04-09 DIAGNOSIS — Z823 Family history of stroke: Secondary | ICD-10-CM | POA: Diagnosis not present

## 2015-04-09 DIAGNOSIS — W010XXA Fall on same level from slipping, tripping and stumbling without subsequent striking against object, initial encounter: Secondary | ICD-10-CM | POA: Diagnosis not present

## 2015-04-09 DIAGNOSIS — K439 Ventral hernia without obstruction or gangrene: Secondary | ICD-10-CM | POA: Diagnosis not present

## 2015-04-09 DIAGNOSIS — Z4789 Encounter for other orthopedic aftercare: Secondary | ICD-10-CM | POA: Diagnosis not present

## 2015-04-09 DIAGNOSIS — D649 Anemia, unspecified: Secondary | ICD-10-CM | POA: Diagnosis not present

## 2015-04-09 DIAGNOSIS — S72012D Unspecified intracapsular fracture of left femur, subsequent encounter for closed fracture with routine healing: Secondary | ICD-10-CM | POA: Diagnosis not present

## 2015-04-09 DIAGNOSIS — E876 Hypokalemia: Secondary | ICD-10-CM | POA: Diagnosis present

## 2015-04-09 DIAGNOSIS — D62 Acute posthemorrhagic anemia: Secondary | ICD-10-CM | POA: Diagnosis not present

## 2015-04-09 DIAGNOSIS — M6281 Muscle weakness (generalized): Secondary | ICD-10-CM | POA: Diagnosis not present

## 2015-04-09 DIAGNOSIS — K259 Gastric ulcer, unspecified as acute or chronic, without hemorrhage or perforation: Secondary | ICD-10-CM | POA: Diagnosis not present

## 2015-04-09 DIAGNOSIS — Z8673 Personal history of transient ischemic attack (TIA), and cerebral infarction without residual deficits: Secondary | ICD-10-CM | POA: Diagnosis not present

## 2015-04-09 DIAGNOSIS — R262 Difficulty in walking, not elsewhere classified: Secondary | ICD-10-CM | POA: Diagnosis not present

## 2015-04-09 DIAGNOSIS — F17200 Nicotine dependence, unspecified, uncomplicated: Secondary | ICD-10-CM | POA: Diagnosis present

## 2015-04-09 DIAGNOSIS — M25559 Pain in unspecified hip: Secondary | ICD-10-CM | POA: Diagnosis not present

## 2015-04-09 DIAGNOSIS — S0990XA Unspecified injury of head, initial encounter: Secondary | ICD-10-CM | POA: Diagnosis not present

## 2015-04-09 DIAGNOSIS — S72002A Fracture of unspecified part of neck of left femur, initial encounter for closed fracture: Secondary | ICD-10-CM

## 2015-04-09 DIAGNOSIS — Z96642 Presence of left artificial hip joint: Secondary | ICD-10-CM | POA: Diagnosis not present

## 2015-04-09 DIAGNOSIS — S72002D Fracture of unspecified part of neck of left femur, subsequent encounter for closed fracture with routine healing: Secondary | ICD-10-CM | POA: Diagnosis not present

## 2015-04-09 DIAGNOSIS — M1990 Unspecified osteoarthritis, unspecified site: Secondary | ICD-10-CM | POA: Diagnosis present

## 2015-04-09 DIAGNOSIS — S72019A Unspecified intracapsular fracture of unspecified femur, initial encounter for closed fracture: Secondary | ICD-10-CM | POA: Diagnosis present

## 2015-04-09 DIAGNOSIS — I10 Essential (primary) hypertension: Secondary | ICD-10-CM | POA: Diagnosis not present

## 2015-04-09 DIAGNOSIS — M25552 Pain in left hip: Secondary | ICD-10-CM | POA: Diagnosis not present

## 2015-04-09 DIAGNOSIS — M25569 Pain in unspecified knee: Secondary | ICD-10-CM | POA: Diagnosis not present

## 2015-04-09 DIAGNOSIS — Z720 Tobacco use: Secondary | ICD-10-CM | POA: Diagnosis not present

## 2015-04-09 DIAGNOSIS — M25562 Pain in left knee: Secondary | ICD-10-CM | POA: Diagnosis not present

## 2015-04-09 HISTORY — PX: HIP ARTHROPLASTY: SHX981

## 2015-04-09 LAB — CBC WITH DIFFERENTIAL/PLATELET
BASOS ABS: 0 10*3/uL (ref 0.0–0.1)
BASOS PCT: 0 %
EOS ABS: 0.1 10*3/uL (ref 0.0–0.7)
EOS PCT: 1 %
HEMATOCRIT: 36.4 % (ref 36.0–46.0)
Hemoglobin: 12.2 g/dL (ref 12.0–15.0)
Lymphocytes Relative: 13 %
Lymphs Abs: 1.7 10*3/uL (ref 0.7–4.0)
MCH: 31.8 pg (ref 26.0–34.0)
MCHC: 33.5 g/dL (ref 30.0–36.0)
MCV: 94.8 fL (ref 78.0–100.0)
MONO ABS: 0.5 10*3/uL (ref 0.1–1.0)
MONOS PCT: 4 %
Neutro Abs: 10.8 10*3/uL — ABNORMAL HIGH (ref 1.7–7.7)
Neutrophils Relative %: 82 %
PLATELETS: 221 10*3/uL (ref 150–400)
RBC: 3.84 MIL/uL — ABNORMAL LOW (ref 3.87–5.11)
RDW: 13.3 % (ref 11.5–15.5)
WBC: 13.2 10*3/uL — ABNORMAL HIGH (ref 4.0–10.5)

## 2015-04-09 LAB — BASIC METABOLIC PANEL
ANION GAP: 8 (ref 5–15)
BUN: 9 mg/dL (ref 6–20)
CALCIUM: 8.9 mg/dL (ref 8.9–10.3)
CO2: 26 mmol/L (ref 22–32)
CREATININE: 0.77 mg/dL (ref 0.44–1.00)
Chloride: 106 mmol/L (ref 101–111)
Glucose, Bld: 136 mg/dL — ABNORMAL HIGH (ref 65–99)
Potassium: 3.3 mmol/L — ABNORMAL LOW (ref 3.5–5.1)
Sodium: 140 mmol/L (ref 135–145)

## 2015-04-09 LAB — PREPARE RBC (CROSSMATCH)

## 2015-04-09 LAB — MAGNESIUM: Magnesium: 1.8 mg/dL (ref 1.7–2.4)

## 2015-04-09 LAB — URINE MICROSCOPIC-ADD ON

## 2015-04-09 LAB — URINALYSIS, ROUTINE W REFLEX MICROSCOPIC
Bilirubin Urine: NEGATIVE
GLUCOSE, UA: NEGATIVE mg/dL
KETONES UR: NEGATIVE mg/dL
LEUKOCYTES UA: NEGATIVE
Nitrite: NEGATIVE
PROTEIN: NEGATIVE mg/dL
Specific Gravity, Urine: 1.015 (ref 1.005–1.030)
Urobilinogen, UA: 0.2 mg/dL (ref 0.0–1.0)
pH: 7 (ref 5.0–8.0)

## 2015-04-09 LAB — SAMPLE TO BLOOD BANK

## 2015-04-09 LAB — APTT: APTT: 30 s (ref 24–37)

## 2015-04-09 LAB — PROTIME-INR
INR: 1.01 (ref 0.00–1.49)
Prothrombin Time: 13.5 seconds (ref 11.6–15.2)

## 2015-04-09 SURGERY — HEMIARTHROPLASTY, HIP, DIRECT ANTERIOR APPROACH, FOR FRACTURE
Anesthesia: Spinal | Site: Hip | Laterality: Left

## 2015-04-09 MED ORDER — BUPIVACAINE-EPINEPHRINE (PF) 0.25% -1:200000 IJ SOLN
INTRAMUSCULAR | Status: AC
  Filled 2015-04-09: qty 60

## 2015-04-09 MED ORDER — ACETAMINOPHEN 10 MG/ML IV SOLN
1000.0000 mg | Freq: Four times a day (QID) | INTRAVENOUS | Status: AC
  Administered 2015-04-10 (×3): 1000 mg via INTRAVENOUS
  Filled 2015-04-09 (×3): qty 100

## 2015-04-09 MED ORDER — NICOTINE 14 MG/24HR TD PT24
14.0000 mg | MEDICATED_PATCH | Freq: Every day | TRANSDERMAL | Status: DC
  Administered 2015-04-09 – 2015-04-12 (×4): 14 mg via TRANSDERMAL
  Filled 2015-04-09 (×4): qty 1

## 2015-04-09 MED ORDER — FENTANYL CITRATE (PF) 100 MCG/2ML IJ SOLN
50.0000 ug | Freq: Once | INTRAMUSCULAR | Status: AC
  Administered 2015-04-09: 50 ug via INTRAVENOUS
  Filled 2015-04-09: qty 2

## 2015-04-09 MED ORDER — POTASSIUM CHLORIDE 10 MEQ/100ML IV SOLN
10.0000 meq | INTRAVENOUS | Status: AC
  Administered 2015-04-09 (×4): 10 meq via INTRAVENOUS
  Filled 2015-04-09 (×2): qty 100

## 2015-04-09 MED ORDER — SODIUM CHLORIDE 0.9 % IR SOLN
Status: DC | PRN
  Administered 2015-04-09: 1000 mL

## 2015-04-09 MED ORDER — ASPIRIN EC 81 MG PO TBEC: 81.0000 mg | DELAYED_RELEASE_TABLET | Freq: Every day | ORAL | Status: DC

## 2015-04-09 MED ORDER — MESALAMINE 400 MG PO CPDR
400.0000 mg | DELAYED_RELEASE_CAPSULE | Freq: Two times a day (BID) | ORAL | Status: DC
  Administered 2015-04-09 – 2015-04-12 (×6): 400 mg via ORAL
  Filled 2015-04-09 (×9): qty 1

## 2015-04-09 MED ORDER — LIDOCAINE HCL (CARDIAC) 10 MG/ML IV SOLN
INTRAVENOUS | Status: DC | PRN
  Administered 2015-04-09: 50 mg via INTRAVENOUS

## 2015-04-09 MED ORDER — POLYETHYLENE GLYCOL 3350 17 G PO PACK
17.0000 g | PACK | Freq: Every day | ORAL | Status: DC
  Administered 2015-04-10 – 2015-04-11 (×2): 17 g via ORAL
  Filled 2015-04-09 (×3): qty 1

## 2015-04-09 MED ORDER — DOCUSATE SODIUM 100 MG PO CAPS
100.0000 mg | ORAL_CAPSULE | Freq: Two times a day (BID) | ORAL | Status: DC
  Administered 2015-04-09 – 2015-04-11 (×5): 100 mg via ORAL
  Filled 2015-04-09 (×6): qty 1

## 2015-04-09 MED ORDER — ONDANSETRON HCL 4 MG PO TABS: 4.0000 mg | ORAL_TABLET | Freq: Four times a day (QID) | ORAL | Status: DC | PRN

## 2015-04-09 MED ORDER — ENOXAPARIN SODIUM 30 MG/0.3ML ~~LOC~~ SOLN
30.0000 mg | SUBCUTANEOUS | Status: DC
Start: 2015-04-10 — End: 2015-04-11
  Administered 2015-04-10 – 2015-04-11 (×2): 30 mg via SUBCUTANEOUS
  Filled 2015-04-09 (×2): qty 0.3

## 2015-04-09 MED ORDER — ONDANSETRON HCL 4 MG/2ML IJ SOLN
INTRAMUSCULAR | Status: DC | PRN
  Administered 2015-04-09: 4 mg via INTRAVENOUS

## 2015-04-09 MED ORDER — FENTANYL CITRATE (PF) 100 MCG/2ML IJ SOLN
INTRAMUSCULAR | Status: DC | PRN
  Administered 2015-04-09: 12.5 ug via INTRAVENOUS
  Administered 2015-04-09: 12.5 ug via INTRATHECAL
  Administered 2015-04-09 (×2): 25 ug via INTRAVENOUS

## 2015-04-09 MED ORDER — SODIUM CHLORIDE 0.9 % IV SOLN
INTRAVENOUS | Status: AC
  Administered 2015-04-09: 01:00:00 via INTRAVENOUS

## 2015-04-09 MED ORDER — TRAMADOL HCL 50 MG PO TABS: 50.0000 mg | ORAL_TABLET | Freq: Once | ORAL | Status: DC

## 2015-04-09 MED ORDER — HEPARIN SODIUM (PORCINE) 5000 UNIT/ML IJ SOLN: 5000.0000 [IU] | Freq: Three times a day (TID) | INTRAMUSCULAR | Status: DC

## 2015-04-09 MED ORDER — CEFAZOLIN SODIUM-DEXTROSE 2-3 GM-% IV SOLR
INTRAVENOUS | Status: AC
  Filled 2015-04-09: qty 50

## 2015-04-09 MED ORDER — MIDAZOLAM HCL 2 MG/2ML IJ SOLN
INTRAMUSCULAR | Status: AC
  Filled 2015-04-09: qty 4

## 2015-04-09 MED ORDER — PROPOFOL 10 MG/ML IV BOLUS
INTRAVENOUS | Status: DC | PRN
  Administered 2015-04-09 (×2): 12 mg via INTRAVENOUS

## 2015-04-09 MED ORDER — SUCRALFATE 1 GM/10ML PO SUSP
1.0000 g | Freq: Three times a day (TID) | ORAL | Status: DC
  Administered 2015-04-09 – 2015-04-12 (×11): 1 g via ORAL
  Filled 2015-04-09 (×11): qty 10

## 2015-04-09 MED ORDER — TETANUS-DIPHTH-ACELL PERTUSSIS 5-2.5-18.5 LF-MCG/0.5 IM SUSP
0.5000 mL | Freq: Once | INTRAMUSCULAR | Status: AC
  Administered 2015-04-09: 0.5 mL via INTRAMUSCULAR
  Filled 2015-04-09: qty 0.5

## 2015-04-09 MED ORDER — PHENYLEPHRINE HCL 10 MG/ML IJ SOLN
INTRAMUSCULAR | Status: DC | PRN
  Administered 2015-04-09 (×6): 40 ug via INTRAVENOUS

## 2015-04-09 MED ORDER — LACTATED RINGERS IV SOLN
INTRAVENOUS | Status: DC | PRN
  Administered 2015-04-09: 14:00:00 via INTRAVENOUS

## 2015-04-09 MED ORDER — MORPHINE SULFATE (PF) 2 MG/ML IV SOLN
INTRAVENOUS | Status: AC
  Filled 2015-04-09: qty 1

## 2015-04-09 MED ORDER — ONDANSETRON HCL 4 MG/2ML IJ SOLN
INTRAMUSCULAR | Status: AC
  Filled 2015-04-09: qty 2

## 2015-04-09 MED ORDER — PROPOFOL 500 MG/50ML IV EMUL
INTRAVENOUS | Status: DC | PRN
  Administered 2015-04-09: 75 ug/kg/min via INTRAVENOUS

## 2015-04-09 MED ORDER — PHENOL 1.4 % MT LIQD: 1.0000 | OROMUCOSAL | Status: DC | PRN

## 2015-04-09 MED ORDER — BISACODYL 10 MG RE SUPP: 10.0000 mg | Freq: Every day | RECTAL | Status: DC | PRN

## 2015-04-09 MED ORDER — LISINOPRIL 10 MG PO TABS
10.0000 mg | ORAL_TABLET | Freq: Every day | ORAL | Status: DC
  Administered 2015-04-11: 10 mg via ORAL
  Filled 2015-04-09 (×3): qty 1

## 2015-04-09 MED ORDER — CHLORHEXIDINE GLUCONATE 4 % EX LIQD
60.0000 mL | Freq: Once | CUTANEOUS | Status: AC
  Administered 2015-04-09: 4 via TOPICAL
  Filled 2015-04-09: qty 15

## 2015-04-09 MED ORDER — EPINEPHRINE HCL 0.1 MG/ML IJ SOSY
PREFILLED_SYRINGE | INTRAMUSCULAR | Status: DC | PRN
  Administered 2015-04-09: .1 ug via INTRATRACHEAL

## 2015-04-09 MED ORDER — MENTHOL 3 MG MT LOZG: 1.0000 | LOZENGE | OROMUCOSAL | Status: DC | PRN

## 2015-04-09 MED ORDER — LIDOCAINE HCL (PF) 1 % IJ SOLN
INTRAMUSCULAR | Status: AC
  Filled 2015-04-09: qty 5

## 2015-04-09 MED ORDER — BUPIVACAINE-EPINEPHRINE (PF) 0.5% -1:200000 IJ SOLN
INTRAMUSCULAR | Status: AC
  Filled 2015-04-09: qty 30

## 2015-04-09 MED ORDER — BUPIVACAINE IN DEXTROSE 0.75-8.25 % IT SOLN
INTRATHECAL | Status: DC | PRN
  Administered 2015-04-09: 15 mg via INTRATHECAL

## 2015-04-09 MED ORDER — ACETAMINOPHEN 10 MG/ML IV SOLN
1000.0000 mg | Freq: Four times a day (QID) | INTRAVENOUS | Status: DC
  Administered 2015-04-09: 1000 mg via INTRAVENOUS
  Filled 2015-04-09 (×4): qty 100

## 2015-04-09 MED ORDER — METOCLOPRAMIDE HCL 10 MG PO TABS: 5.0000 mg | ORAL_TABLET | Freq: Three times a day (TID) | ORAL | Status: DC | PRN

## 2015-04-09 MED ORDER — CEFAZOLIN SODIUM-DEXTROSE 2-3 GM-% IV SOLR
2.0000 g | INTRAVENOUS | Status: AC
  Administered 2015-04-09: 2 g via INTRAVENOUS

## 2015-04-09 MED ORDER — MAGNESIUM CITRATE PO SOLN: 1.0000 | Freq: Once | ORAL | Status: DC | PRN

## 2015-04-09 MED ORDER — BUPIVACAINE IN DEXTROSE 0.75-8.25 % IT SOLN
INTRATHECAL | Status: AC
  Filled 2015-04-09: qty 2

## 2015-04-09 MED ORDER — LACTATED RINGERS IV SOLN
Freq: Once | INTRAVENOUS | Status: AC
  Administered 2015-04-09: 14:00:00 via INTRAVENOUS

## 2015-04-09 MED ORDER — SODIUM CHLORIDE 0.9 % IJ SOLN
INTRAMUSCULAR | Status: AC
  Filled 2015-04-09: qty 40

## 2015-04-09 MED ORDER — BUPIVACAINE-EPINEPHRINE (PF) 0.5% -1:200000 IJ SOLN
INTRAMUSCULAR | Status: DC | PRN
  Administered 2015-04-09: 60 mL

## 2015-04-09 MED ORDER — MESALAMINE 400 MG PO CPDR
DELAYED_RELEASE_CAPSULE | ORAL | Status: AC
  Filled 2015-04-09: qty 1

## 2015-04-09 MED ORDER — ONDANSETRON HCL 4 MG/2ML IJ SOLN
4.0000 mg | Freq: Once | INTRAMUSCULAR | Status: AC
  Administered 2015-04-09: 4 mg via INTRAVENOUS

## 2015-04-09 MED ORDER — POTASSIUM CHLORIDE IN NACL 20-0.9 MEQ/L-% IV SOLN
INTRAVENOUS | Status: DC
  Administered 2015-04-09 – 2015-04-11 (×3): via INTRAVENOUS

## 2015-04-09 MED ORDER — ONDANSETRON HCL 4 MG/2ML IJ SOLN
4.0000 mg | Freq: Four times a day (QID) | INTRAMUSCULAR | Status: DC | PRN
  Administered 2015-04-10 – 2015-04-11 (×3): 4 mg via INTRAVENOUS
  Filled 2015-04-09 (×3): qty 2

## 2015-04-09 MED ORDER — POTASSIUM CHLORIDE 10 MEQ/100ML IV SOLN
INTRAVENOUS | Status: AC
  Filled 2015-04-09: qty 200

## 2015-04-09 MED ORDER — ONDANSETRON HCL 4 MG/2ML IJ SOLN
4.0000 mg | Freq: Once | INTRAMUSCULAR | Status: AC
  Administered 2015-04-09: 4 mg via INTRAVENOUS
  Filled 2015-04-09: qty 2

## 2015-04-09 MED ORDER — CEFAZOLIN SODIUM-DEXTROSE 2-3 GM-% IV SOLR
INTRAVENOUS | Status: AC
  Filled 2015-04-09: qty 100

## 2015-04-09 MED ORDER — MIDAZOLAM HCL 5 MG/5ML IJ SOLN
INTRAMUSCULAR | Status: DC | PRN
  Administered 2015-04-09 (×7): 0.5 mg via INTRAVENOUS

## 2015-04-09 MED ORDER — BUPIVACAINE-EPINEPHRINE (PF) 0.5% -1:200000 IJ SOLN
INTRAMUSCULAR | Status: AC
Start: 2015-04-09 — End: 2015-04-09
  Filled 2015-04-09: qty 30

## 2015-04-09 MED ORDER — SODIUM CHLORIDE 0.9 % IV SOLN: Freq: Once | INTRAVENOUS | Status: DC

## 2015-04-09 MED ORDER — CHLORHEXIDINE GLUCONATE 4 % EX LIQD
60.0000 mL | Freq: Once | CUTANEOUS | Status: AC
  Filled 2015-04-09: qty 45

## 2015-04-09 MED ORDER — BUPIVACAINE LIPOSOME 1.3 % IJ SUSP
INTRAMUSCULAR | Status: AC
Start: 2015-04-09 — End: 2015-04-09
  Filled 2015-04-09: qty 20

## 2015-04-09 MED ORDER — HYDROCODONE-ACETAMINOPHEN 5-325 MG PO TABS
1.0000 | ORAL_TABLET | Freq: Four times a day (QID) | ORAL | Status: DC | PRN
  Administered 2015-04-09: 2 via ORAL
  Administered 2015-04-11 (×2): 1 via ORAL
  Administered 2015-04-11: 2 via ORAL
  Administered 2015-04-12: 1 via ORAL
  Filled 2015-04-09 (×2): qty 1
  Filled 2015-04-09: qty 2
  Filled 2015-04-09: qty 1
  Filled 2015-04-09 (×3): qty 2
  Filled 2015-04-09: qty 1

## 2015-04-09 MED ORDER — PANTOPRAZOLE SODIUM 40 MG PO TBEC
40.0000 mg | DELAYED_RELEASE_TABLET | Freq: Every day | ORAL | Status: DC
  Administered 2015-04-10 – 2015-04-12 (×3): 40 mg via ORAL
  Filled 2015-04-09 (×3): qty 1

## 2015-04-09 MED ORDER — MORPHINE SULFATE (PF) 2 MG/ML IV SOLN
0.5000 mg | INTRAVENOUS | Status: DC | PRN
  Administered 2015-04-09 – 2015-04-10 (×8): 0.5 mg via INTRAVENOUS
  Filled 2015-04-09 (×7): qty 1

## 2015-04-09 MED ORDER — DEXTROSE 5 % IV SOLN
500.0000 mg | Freq: Four times a day (QID) | INTRAVENOUS | Status: DC | PRN
  Filled 2015-04-09: qty 5

## 2015-04-09 MED ORDER — METOCLOPRAMIDE HCL 5 MG/ML IJ SOLN
5.0000 mg | Freq: Three times a day (TID) | INTRAMUSCULAR | Status: DC | PRN
Start: 2015-04-09 — End: 2015-04-11

## 2015-04-09 MED ORDER — TRAMADOL HCL 50 MG PO TABS
50.0000 mg | ORAL_TABLET | Freq: Four times a day (QID) | ORAL | Status: DC
Start: 2015-04-09 — End: 2015-04-12
  Administered 2015-04-10 – 2015-04-12 (×11): 50 mg via ORAL
  Filled 2015-04-09 (×12): qty 1

## 2015-04-09 MED ORDER — CEFAZOLIN SODIUM-DEXTROSE 2-3 GM-% IV SOLR
2.0000 g | Freq: Four times a day (QID) | INTRAVENOUS | Status: AC
  Administered 2015-04-09 – 2015-04-10 (×2): 2 g via INTRAVENOUS
  Filled 2015-04-09 (×2): qty 50

## 2015-04-09 MED ORDER — FENTANYL CITRATE (PF) 100 MCG/2ML IJ SOLN
INTRAMUSCULAR | Status: AC
  Filled 2015-04-09: qty 4

## 2015-04-09 MED ORDER — METHOCARBAMOL 500 MG PO TABS
500.0000 mg | ORAL_TABLET | Freq: Four times a day (QID) | ORAL | Status: DC | PRN
  Administered 2015-04-10 – 2015-04-11 (×4): 500 mg via ORAL
  Filled 2015-04-09 (×4): qty 1

## 2015-04-09 MED ORDER — POTASSIUM CHLORIDE CRYS ER 20 MEQ PO TBCR: 40.0000 meq | EXTENDED_RELEASE_TABLET | Freq: Once | ORAL | Status: DC

## 2015-04-09 MED ORDER — MORPHINE SULFATE (PF) 4 MG/ML IV SOLN
4.0000 mg | Freq: Once | INTRAVENOUS | Status: AC
  Administered 2015-04-09: 4 mg via INTRAVENOUS
  Filled 2015-04-09: qty 1

## 2015-04-09 MED ORDER — POLYETHYLENE GLYCOL 3350 17 G PO PACK: 17.0000 g | PACK | Freq: Every day | ORAL | Status: DC | PRN

## 2015-04-09 SURGICAL SUPPLY — 46 items
BAG HAMPER (MISCELLANEOUS) ×3 IMPLANT
BIT DRILL 2.8X128 (BIT) ×2 IMPLANT
BIT DRILL 2.8X128MM (BIT) ×1
BLADE HEX COATED 2.75 (ELECTRODE) ×3 IMPLANT
BLADE SAGITTAL 25.0X1.27X90 (BLADE) ×2 IMPLANT
BLADE SAGITTAL 25.0X1.27X90MM (BLADE) ×1
CAPT HIP HEMI 1 ×2 IMPLANT
CHLORAPREP W/TINT 26ML (MISCELLANEOUS) ×5 IMPLANT
CLOTH BEACON ORANGE TIMEOUT ST (SAFETY) ×3 IMPLANT
COVER LIGHT HANDLE STERIS (MISCELLANEOUS) ×6 IMPLANT
DECANTER SPIKE VIAL GLASS SM (MISCELLANEOUS) ×6 IMPLANT
DRAPE HIP W/POCKET STRL (DRAPE) ×3 IMPLANT
DRSG MEPILEX BORDER 4X12 (GAUZE/BANDAGES/DRESSINGS) ×2 IMPLANT
ELECT REM PT RETURN 9FT ADLT (ELECTROSURGICAL) ×3
ELECTRODE REM PT RTRN 9FT ADLT (ELECTROSURGICAL) ×1 IMPLANT
GLOVE BIOGEL M 7.0 STRL (GLOVE) ×4 IMPLANT
GLOVE BIOGEL PI IND STRL 7.0 (GLOVE) IMPLANT
GLOVE BIOGEL PI INDICATOR 7.0 (GLOVE) ×6
GLOVE SKINSENSE NS SZ8.0 LF (GLOVE) ×4
GLOVE SKINSENSE STRL SZ8.0 LF (GLOVE) ×2 IMPLANT
GLOVE SS N UNI LF 8.5 STRL (GLOVE) ×3 IMPLANT
GOWN STRL REUS W/TWL LRG LVL3 (GOWN DISPOSABLE) ×6 IMPLANT
GOWN STRL REUS W/TWL XL LVL3 (GOWN DISPOSABLE) ×3 IMPLANT
INST SET MAJOR BONE (KITS) ×3 IMPLANT
KIT BLADEGUARD II DBL (SET/KITS/TRAYS/PACK) ×3 IMPLANT
KIT ROOM TURNOVER APOR (KITS) ×3 IMPLANT
MANIFOLD NEPTUNE II (INSTRUMENTS) ×3 IMPLANT
MARKER SKIN DUAL TIP RULER LAB (MISCELLANEOUS) ×3 IMPLANT
NDL HYPO 21X1.5 SAFETY (NEEDLE) ×1 IMPLANT
NEEDLE HYPO 21X1.5 SAFETY (NEEDLE) ×3 IMPLANT
NS IRRIG 1000ML POUR BTL (IV SOLUTION) ×3 IMPLANT
PACK TOTAL JOINT (CUSTOM PROCEDURE TRAY) ×3 IMPLANT
PAD ARMBOARD 7.5X6 YLW CONV (MISCELLANEOUS) ×3 IMPLANT
PILLOW HIP ABDUCTION SM (ORTHOPEDIC SUPPLIES) ×2 IMPLANT
PIN STMN SNGL STERILE 9X3.6MM (PIN) ×5 IMPLANT
SET BASIN LINEN APH (SET/KITS/TRAYS/PACK) ×3 IMPLANT
STAPLER VISISTAT 35W (STAPLE) ×3 IMPLANT
SUT BRALON NAB BRD #1 30IN (SUTURE) ×10 IMPLANT
SUT ETHIBOND 5 LR DA (SUTURE) ×4 IMPLANT
SUT MNCRL 0 VIOLET CTX 36 (SUTURE) ×1 IMPLANT
SUT MONOCRYL 0 CTX 36 (SUTURE) ×2
SUT VIC AB 1 CT1 27 (SUTURE) ×6
SUT VIC AB 1 CT1 27XBRD ANTBC (SUTURE) IMPLANT
SYR 30ML LL (SYRINGE) ×3 IMPLANT
SYR BULB IRRIGATION 50ML (SYRINGE) ×3 IMPLANT
WATER STERILE IRR 1000ML POUR (IV SOLUTION) ×12 IMPLANT

## 2015-04-09 NOTE — Op Note (Signed)
04/08/2015 - 04/09/2015  4:39 PM  PATIENT:  [name]  69 y.o. female  PRE-OPERATIVE DIAGNOSIS:  fracture left hip femoral neck  POST-OPERATIVE DIAGNOSIS:  fracture left hip femoral neck  depuy 23F 46H 1.5 NECK SUMMIT BASIC   Operative findings femoral neck fracture oblique acetabulum normal  Assisted by Simonne Maffucci  Spinal anesthetic  Estimated blood loss 25CC mL  PROCEDURE:  Procedure(s): INSERT PARTIAL HIP REPLACEMENT LEFT (Left)  SURGEON:  Surgeon(s) and Role:    * Carole Civil, MD - Primary   EBL:  Total I/O In: 700 [I.V.:700] Out: 1225 [Urine:1200; Blood:25] LOCAL MEDICATIONS USED:  MARCAINE     SPECIMEN:  No Specimen  DISPOSITION OF SPECIMEN:  N/A  COUNTS:  YES  TOURNIQUET:  * No tourniquets in log *  DICTATION: .Dragon Dictation  PLAN OF CARE: Admit to inpatient   PATIENT DISPOSITION:  PACU - hemodynamically stable.   Delay start of Pharmacological VTE agent (>24hrs) due to surgical blood loss or risk of bleeding: yes  Procedure as follows  Patient identified by to improved identification markers. Chart review. Site confirmed and marked left hip.  Patient taken to the surgical suite. Spinal anesthetic. Patient placed on operating table and lateral decubitus position left side up.  Patient's leg prepped from pelvis down to and including the foot draped sterilely. Timeout completed and executed.  History patient fell fractured her left hip could walk. Admitted through the ED. Agreed to have surgical intervention to restore hip function.  Lateral approach to the hip Lateral incision over the greater trochanter. Subcutaneous tissues tissue divided down to the abductors. Anterior half of the abductors in continuity with the vastus lateralis subperiosteally dissected from the proximal femur and greater trochanter. A capsulectomy performed. Hip inspected after irrigation. Normal acetabulum. Hip dislocated anteriorly. Femoral head removed. Measured  46 mm.  Proximal femoral preparation: Box cutter. Pilot hole drill. Canal finder. Serial broaching up to a size 2. Calcar planer.  Trial reduction with a size 2 femur, 46 x 1.5 head. Tested by shuck test. Leg length matched opposite leg. External rotation and extension no dislocation. Flexion internal rotation 45 no dislocation. Sleep test no dislocation.  Hip dislocated trials removed.  Real implants placed. The vastus lateralis abductor soft tissue repaired back to its anatomic position with the leg internally rotated using #1 Bralon sutures in interrupted fashion. Hip abductor fascia closed with #1 Bralon. The hip joint and the subfascial layer was injected with a total of 60 mL of Marcaine with epinephrine  Hip range of motion matched trial reduction  Subcutaneous tissue closed with 0 Monocryl suture skin approximated with skin staples. Sterile dressing applied. Patient taken back to the regular bed abduction pillow placed. Prior abduction pillow placed leg lengths checked and they were equal  Postop Weight-bear as tolerated Lovenox DVT prevention 28 days Lateral approach precautions

## 2015-04-09 NOTE — H&P (Signed)
PCP:   Glo Herring., MD   Chief Complaint:  Fall  HPI:  69 year old female who  has a past medical history of Crohn's disease ([place]); Hypertension; Stroke [facility]); Arthritis; Sciatica of right side; Degenerative disc disease, lumbar; and Ventral hernia. Today comes to the hospital with complaints of fall. Patient says that she went outside to check on her cats slipped and fell landing on left side. She complained of pain in the left hip and knee. No loss of consciousness, no chest pain no shortness of breath. No neck pain or headache. CT head and CT cervical spine done the ED are negative for acute injury. X-ray of the hip shows left subcapital fracture of femur. Orthopedic surgery has been consulted by the ED physician. Patient denies any cardiac history, has a history of stroke in the past.  Allergies:   Allergies  Allergen Reactions  . Flagyl [Metronidazole Hcl] Other (See Comments)    "makes me feel whoosy and weird"  . Other     Unknown antibiotic and unknown pain medications       Past Medical History  Diagnosis Date  . Crohn's disease ([place])   . Hypertension   . Stroke ([place])   . Arthritis   . Sciatica of right side   . Degenerative disc disease, lumbar   . Ventral hernia     Past Surgical History  Procedure Laterality Date  . Cholecystectomy      2008  . Carotid endarterectomy      2004  . Hemicolectomy      right  . Tubal ligation    . Incisional hernia repair N/A 05/20/2013    Procedure: [name] HERNIORRHAPHY WITH MESH;  Surgeon: Jamesetta So, MD;  Location: [facility];  Service: General;  Laterality: N/A;  . Insertion of mesh N/A 05/20/2013    Procedure: INSERTION OF MESH;  Surgeon: Jamesetta So, MD;  Location: [facility] ORS;  Service: General;  Laterality: N/A;  . Incisional hernia repair N/A 05/19/2014    Procedure: RECURRENT HERNIA REPAIR INCISIONAL;  Surgeon: Jamesetta So, MD;  Location: [facility] OR[facility];  Service: General;  Laterality: N/A;  . Insertion of mesh  N/A 05/19/2014    Procedure: INSERTION OF MESH;  Surgeon: Jamesetta So, MD;  Location: [facility] ORS;  Service: General;  Laterality: N/A;  . Esophagogastroduodenoscopy N/A 02/24/2015    Procedure: ESOPHAGOGASTRODUODENOSCOPY (EGD);  Surgeon: Daneil Dolin, MD;  Location: [facility] SU[facility];  Service: Endoscopy;  Laterality: N/A;    Prior to Admission medications   Medication Sig Start Date End Date Taking? Authorizing Provider  acetaminophen (TYLENOL) 500 MG tablet Take 1,000 mg by mouth daily as needed for mild pain or moderate pain.    Historical Provider, MD  aspirin EC 81 MG tablet Take 1 tablet (81 mg total) by mouth daily. Resume in 1 week 02/25/15   Kathie Dike, MD  ciprofloxacin (CIPRO) 250 MG tablet Take 1 tablet (250 mg total) by mouth 2 (two) times daily. Patient not taking: Reported on 03/13/2015 02/25/15   Kathie Dike, MD  DELZICOL 400 MG CPDR DR capsule TAKE 2 CAPSULES BY MOUTH TWICE DAILY. 01/05/15   Butch Penny, NP  diphenhydrAMINE (BENADRYL) 25 mg capsule Take 25 mg by mouth every 6 (six) hours as needed for itching.    Historical Provider, MD  lisinopril (PRINIVIL,ZESTRIL) 20 MG tablet Take 10 mg by mouth daily.    Historical Provider, MD  omeprazole (PRILOSEC) 20 MG capsule Take 1 capsule (20 mg  total) by mouth 2 (two) times daily before a meal. 02/28/15   Butch Penny, NP  ondansetron (ZOFRAN) 4 MG tablet Take 4 mg by mouth every 8 (eight) hours as needed for nausea or vomiting.     Historical Provider, MD  pantoprazole (PROTONIX) 40 MG tablet Take 1 tablet (40 mg total) by mouth 2 (two) times daily. Patient not taking: Reported on 03/13/2015 02/25/15   Kathie Dike, MD  polyethylene glycol [facility]) packet Take 17 g by mouth daily as needed for mild constipation. 02/25/15   Kathie Dike, MD  sucralfate (CARAFATE) 1 GM/10ML suspension Take 10 mLs (1 g total) by mouth 4 (four) times daily -  with meals and at bedtime. 02/25/15   Kathie Dike, MD    Social History:  reports  that she has been smoking.  She has never used smokeless tobacco. She reports that she does not drink alcohol or use illicit drugs.  Family History  Problem Relation Age of Onset  . GI problems Father   . Stroke Brother   . Heart attack Mother   . Hypotension Sister     Danley Danker Weights   04/08/15 2330  Weight: 57.153 kg (126 lb)    All the positives are listed in BOLD  Review of Systems:  HEENT: Headache, blurred vision, runny nose, sore throat Neck: Hypothyroidism, hyperthyroidism,,lymphadenopathy Chest : Shortness of breath, history of COPD, Asthma Heart : Chest pain, history of coronary arterey disease GI:  Nausea, vomiting, diarrhea, constipation, GERD GU: Dysuria, urgency, frequency of urination, hematuria Neuro: Stroke, seizures, syncope Psych: Depression, anxiety, hallucinations   Physical Exam: Blood pressure 145/70, pulse 88, temperature 97.9 F (36.6 C), temperature source Oral, resp. rate 20, height 5' 2"  (1.575 m), weight 57.153 kg (126 lb), SpO2 94 %. Constitutional:   Patient is a well-developed and well-nourished female* in no acute distress and cooperative with exam. Head: Normocephalic and atraumatic Mouth: Mucus membranes moist Eyes: PERRL, EOMI, conjunctivae normal Neck: Supple, No Thyromegaly Cardiovascular: RRR, S1 normal, S2 normal Pulmonary/Chest: CTAB, no wheezes, rales, or rhonchi Abdominal: Soft. Non-tender, non-distended, bowel sounds are normal, no masses, organomegaly, or guarding present.  Neurological: A&O x3, Strength is normal and symmetric bilaterally, cranial nerve II-XII are grossly intact, no focal motor deficit, sensory intact to light touch bilaterally.  Extremities : No Cyanosis, Clubbing or Edema  Labs on Admission:  Basic Metabolic Panel:  Recent Labs Lab 04/09/15 0056  NA 140  K 3.3*  CL 106  CO2 26  GLUCOSE 136*  BUN 9  CREATININE 0.77  CALCIUM 8.9   CBC:  Recent Labs Lab 04/09/15 0056  WBC 13.2*  NEUTROABS 10.8*   HGB 12.2  HCT 36.4  MCV 94.8  PLT 221    Radiological Exams on Admission: Dg Knee Complete 4 Views Left  04/09/2015  CLINICAL DATA:  Status post fall onto left side. Left lateral knee pain. Initial encounter. EXAM: LEFT KNEE - COMPLETE 4+ VIEW COMPARISON:  None. FINDINGS: There is no evidence of fracture or dislocation. The joint spaces are preserved. No significant degenerative change is seen; the patellofemoral joint is grossly unremarkable in appearance. No significant joint effusion is seen. Mild scattered vascular calcifications are seen. IMPRESSION: No evidence of fracture or dislocation. Electronically Signed   By: Garald Balding M.D.   On: 04/09/2015 02:12   Dg Hip Unilat W Or W/o Pelvis 2-3 Views Left  04/09/2015  CLINICAL DATA:  Status post fall onto left side, with generalized left hip pain. Initial encounter.  EXAM: DG HIP (WITH OR WITHOUT PELVIS) 2-3V LEFT COMPARISON:  None. FINDINGS: There is a displaced subcapital fracture of the left femoral neck, with mild comminution. The left femoral head remains seated at the acetabulum. The right hip joint is grossly unremarkable in appearance. Mild degenerative change is noted at the sacroiliac joints. The visualized bowel gas pattern is grossly unremarkable in appearance. IMPRESSION: Displaced subcapital fracture of the left femoral neck, with mild comminution. Electronically Signed   By: Garald Balding M.D.   On: 04/09/2015 02:11    EKG: Independently reviewed. Sinus rhythm   Assessment/Plan Active Problems:   Crohn's disease (HCC)   Tobacco abuse   HTN (hypertension)   Subcapital fracture of hip (HCC)   Hip fracture (HCC)    Hypokalemia  Hip fracture Admit the patient, initiate hip fracture protocol. Orthopedic surgery has been consulted by the ED physician.  Hypokalemia Replace potassium and check BMP in a.m.  Hypertension Continue home medications of lisinopril Blood pressure is controlled.  History of gastric  erosions and ulceration Continue Protonix and sucralfate  History of Crohn disease Stable continue Delzicol.  DVT prophylaxis Lovenox   Code status: Full code  Family discussion: Admission, patients condition and plan of care including tests being ordered have been discussed with the patient and her daughter at bedside* who indicate understanding and agree with the plan and Code Status.   Time Spent on Admission: 60 min  [place] Pager: [phone] 04/09/2015, 4:45 AM  If 7PM-7AM, please contact night-coverage  [email or web address]  Password [email or web address]

## 2015-04-09 NOTE — Brief Op Note (Addendum)
04/08/2015 - 04/09/2015  4:39 PM  PATIENT:  [name]  69 y.o. female  PRE-OPERATIVE DIAGNOSIS:  fracture left hip femoral neck  POST-OPERATIVE DIAGNOSIS:  fracture left hip femoral neck  depuy 41F 46H 1.5 NECK SUMMIT BASIC   Operative findings femoral neck fracture oblique acetabulum normal  Assisted by Simonne Maffucci  Spinal anesthetic  Estimated blood loss 25CC mL  PROCEDURE:  Procedure(s): INSERT PARTIAL HIP REPLACEMENT LEFT (Left)  SURGEON:  Surgeon(s) and Role:    * Carole Civil, MD - Primary   EBL:  Total I/O In: 700 [I.V.:700] Out: 1225 [Urine:1200; Blood:25] LOCAL MEDICATIONS USED:  MARCAINE     SPECIMEN:  No Specimen  DISPOSITION OF SPECIMEN:  N/A  COUNTS:  YES  TOURNIQUET:  * No tourniquets in log *  DICTATION: .Dragon Dictation  PLAN OF CARE: Admit to inpatient   PATIENT DISPOSITION:  PACU - hemodynamically stable.   Delay start of Pharmacological VTE agent (>24hrs) due to surgical blood loss or risk of bleeding: yes  Procedure as follows  Patient identified by to improved identification markers. Chart review. Site confirmed and marked left hip.  Patient taken to the surgical suite. Spinal anesthetic. Patient placed on operating table and lateral decubitus position left side up.  Patient's leg prepped from pelvis down to and including the foot draped sterilely. Timeout completed and executed.  History patient fell fractured her left hip could walk. Admitted through the ED. Agreed to have surgical intervention to restore hip function.  Lateral approach to the hip Lateral incision over the greater trochanter. Subcutaneous tissues tissue divided down to the abductors. Anterior half of the abductors in continuity with the vastus lateralis subperiosteally dissected from the proximal femur and greater trochanter. A capsulectomy performed. Hip inspected after irrigation. Normal acetabulum. Hip dislocated anteriorly. Femoral head removed. Measured  46 mm.  Proximal femoral preparation: Box cutter. Pilot hole drill. Canal finder. Serial broaching up to a size 2. Calcar planer.  Trial reduction with a size 2 femur, 46 x 1.5 head. Tested by shuck test. Leg length matched opposite leg. External rotation and extension no dislocation. Flexion internal rotation 45 no dislocation. Sleep test no dislocation.  Hip dislocated trials removed.  Real implants placed. The vastus lateralis abductor soft tissue repaired back to its anatomic position with the leg internally rotated using #1 Bralon sutures in interrupted fashion. Hip abductor fascia closed with #1 Bralon. The hip joint and the subfascial layer was injected with a total of 60 mL of Marcaine with epinephrine  Hip range of motion matched trial reduction  Subcutaneous tissue closed with 0 Monocryl suture skin approximated with skin staples. Sterile dressing applied. Patient taken back to the regular bed abduction pillow placed. Prior abduction pillow placed leg lengths checked and they were equal  Postop Weight-bear as tolerated Lovenox DVT prevention 28 days Lateral approach precautions

## 2015-04-09 NOTE — Progress Notes (Signed)
Patient seen and examined. Admitted after midnight after mechanical fall and left hip pain. Found to have left hip fracture and will required surgical repair. Denies CP, SOB and her VSS. Patient will be taken to surgery today (10/24). Please refer to H&P written by Dr. Darrick Meigs for further info/details on admission.  Plan: -continue supportive care and PRN analgesics -nicotine patch for tobacco abuse -will follow orthopedic service rec's  Barton Dubois [phone]

## 2015-04-09 NOTE — Transfer of Care (Signed)
Immediate Anesthesia Transfer of Care Note  Patient: [name]  Procedure(s) Performed: Procedure(s): INSERT PARTIAL HIP REPLACEMENT LEFT (Left)  Patient Location: PACU  Anesthesia Type:Spinal  Level of Consciousness: awake and patient cooperative  Airway & Oxygen Therapy: Patient Spontanous Breathing and Patient connected to nasal cannula oxygen  Post-op Assessment: Report given to RN  Post vital signs: Reviewed and stable    Complications: No apparent anesthesia complications

## 2015-04-09 NOTE — Anesthesia Procedure Notes (Signed)
Date/Time: 04/09/2015 2:41 PM Performed by: Vista Deck Pre-anesthesia Checklist: Patient identified, Emergency Drugs available, Suction available, Timeout performed and Patient being monitored Patient Re-evaluated:Patient Re-evaluated prior to inductionOxygen Delivery Method: Non-rebreather mask    Spinal Patient location during procedure: OR Start time: 04/09/2015 2:54 PM End time: 04/09/2015 2:58 PM Staffing Resident/CRNA: Drucie Opitz S Preanesthetic Checklist Completed: patient identified, site marked, surgical consent, pre-op evaluation, timeout performed, IV checked, risks and benefits discussed and monitors and equipment checked Spinal Block Patient position: left lateral decubitus Prep: Betadine Patient monitoring: heart rate, cardiac monitor, continuous pulse ox and blood pressure Approach: left paramedian Location: L3-4 Injection technique: single-shot Needle Needle type: Spinocan  Needle gauge: 22 G Needle length: 9 cm Assessment Sensory level: T6 Additional Notes  ATTEMPTS: 1 TRAY ID: [id number] TRAY EXPIRATION DATE: 2017-12

## 2015-04-09 NOTE — Care Management Note (Signed)
Case Management Note  Patient Details  Name: [name] MRN: [id number] Date of Birth: Apr 07, 1946  Subjective/Objective:                  Pt admitted from home with hip fracture. Pt lives with her daughter and is very independent with ADL's.   Action/Plan: Pt for surgery today. Anticipate need for SNF at discharge. Will continue to follow for discharge planning needs.  Expected Discharge Date:                  Expected Discharge Plan:  Skilled Nursing Facility  In-House Referral:  Clinical Social Work  Discharge planning Services  CM Consult  Post Acute Care Choice:  NA Choice offered to:  NA  DME Arranged:    DME Agency:     HH Arranged:    [place] Agency:     Status of Service:  In process, will continue to follow  Medicare Important Message Given:    Date Medicare IM Given:    Medicare IM give by:    Date Additional Medicare IM Given:    Additional Medicare Important Message give by:     If discussed at [place] of Stay Meetings, dates discussed:    Additional Comments:  Joylene Draft, RN 04/09/2015, 10:18 AM

## 2015-04-09 NOTE — Anesthesia Postprocedure Evaluation (Signed)
  Anesthesia Post-op Note  Patient: [name]  Procedure(s) Performed: Procedure(s): INSERT PARTIAL HIP REPLACEMENT LEFT (Left)  Patient Location: PACU  Anesthesia Type:Spinal  Level of Consciousness: awake and patient cooperative  Airway and Oxygen Therapy: Patient Spontanous Breathing and Patient connected to nasal cannula oxygen  Post-op Pain: none  Post-op Assessment: Post-op Vital signs reviewed, Patient's Cardiovascular Status Stable, Respiratory Function Stable and Patent Airway LLE Motor Response: No movement to painful stimulus LLE Sensation: No sensation (absent) RLE Motor Response: No movement to painful stimulus RLE Sensation: No sensation (absent) L Sensory Level: T8 R Sensory Level: T8  Post-op Vital Signs: Reviewed and stable  Last Vitals:  Filed Vitals:   04/09/15 1645  BP: 91/49  Pulse: 88  Temp:   Resp: 13    Complications: No apparent anesthesia complications

## 2015-04-09 NOTE — Consult Note (Addendum)
HOSPITAL CONSULT [phone] (HIP FRACTURE )  MDM= MODERATE COMPLEXITY COMP HISTORY COMP EXAM  Patient ID: [name], female   DOB: April 29, 1946, 69 y.o.   MRN: [id number]  New patient consult by Dr Darrick Meigs   Chief Complaint  Patient presents with  . Fall     CONCEPTION [name] is a 69 y.o. female.   HPI I also incorporated the history as given by the history and physical dictated in the chart by the  Requesting physician  The patient and her daughter are present and give the following history: The patient says she was watching or looking at a cat and a possum, the possum went under a car or something; she went to look and slipped and fell. Complains of severe dull aching constant left hip pain of one days duration worsened with attempts at moving her left hip and left leg.  Review of Systems Review of Systems  Musculoskeletal: Positive for joint pain and falls.  All other systems reviewed and are negative.   Past Medical History  Diagnosis Date  . Crohn's disease ([place])   . Hypertension   . Stroke ([place])   . Arthritis   . Sciatica of right side   . Degenerative disc disease, lumbar   . Ventral hernia     Past Surgical History  Procedure Laterality Date  . Cholecystectomy      2008  . Carotid endarterectomy      2004  . Hemicolectomy      right  . Tubal ligation    . Incisional hernia repair N/A 05/20/2013    Procedure: [name] HERNIORRHAPHY WITH MESH;  Surgeon: Jamesetta So, MD;  Location: [facility];  Service: General;  Laterality: N/A;  . Insertion of mesh N/A 05/20/2013    Procedure: INSERTION OF MESH;  Surgeon: Jamesetta So, MD;  Location: [facility];  Service: General;  Laterality: N/A;  . Incisional hernia repair N/A 05/19/2014    Procedure: RECURRENT HERNIA REPAIR INCISIONAL;  Surgeon: Jamesetta So, MD;  Location: [facility];  Service: General;  Laterality: N/A;  . Insertion of mesh N/A 05/19/2014    Procedure: INSERTION OF MESH;  Surgeon: Jamesetta So, MD;  Location: [facility];   Service: General;  Laterality: N/A;  . Esophagogastroduodenoscopy N/A 02/24/2015    Procedure: ESOPHAGOGASTRODUODENOSCOPY (EGD);  Surgeon: Daneil Dolin, MD;  Location[facility];  Service: Endoscopy;  Laterality: N/A;    Family History  Problem Relation Age of Onset  . GI problems Father   . Stroke Brother   . Heart attack Mother   . Hypotension Sister     Social History Social History  Substance Use Topics  . Smoking status: Current Every Day Smoker -- 1.00 packs/day for 30 years  . Smokeless tobacco: Never Used     Comment: 1-2 pack day x 20 yrs.  . Alcohol Use: No     Comment: one every 2 -3 months.    Allergies  Allergen Reactions  . Flagyl [Metronidazole Hcl] Other (See Comments)    "makes me feel whoosy and weird"  . Other     Unknown antibiotic and unknown pain medications     Current Facility-Administered Medications  Medication Dose Route Frequency Provider Last Rate Last Dose  . 0.9 %  sodium chloride infusion   Intravenous Once Carole Civil, MD      . aspirin EC tablet 81 mg  81 mg Oral Daily Oswald Hillock, MD   81 mg  at 04/09/15 0911  . HYDROcodone-acetaminophen (NORCO/VICODIN) 5-325 MG per tablet 1-2 tablet  1-2 tablet Oral Q6H PRN Oswald Hillock, MD      . lisinopril (PRINIVIL,ZESTRIL) tablet 10 mg  10 mg Oral Daily Oswald Hillock, MD   10 mg at 04/09/15 0911  . Mesalamine (ASACOL) DR capsule 400 mg  400 mg Oral BID Oswald Hillock, MD   400 mg at 04/09/15 0912  . methocarbamol (ROBAXIN) tablet 500 mg  500 mg Oral Q6H PRN Oswald Hillock, MD       Or  . methocarbamol (ROBAXIN) 500 mg in dextrose 5 % 50 mL IVPB  500 mg Intravenous Q6H PRN Oswald Hillock, MD      . morphine 2 MG/ML injection 0.5 mg  0.5 mg Intravenous Q2H PRN Oswald Hillock, MD   0.5 mg at 04/09/15 1043  . morphine 2 MG/ML injection           . nicotine (NICODERM CQ - dosed in mg/24 hours) patch 14 mg  14 mg Transdermal Daily Barton Dubois, MD      . ondansetron [facility]) 4 MG/2ML injection            . pantoprazole (PROTONIX) EC tablet 40 mg  40 mg Oral Daily Oswald Hillock, MD   40 mg at 04/09/15 0912  . polyethylene glycol (MIRALAX / GLYCOLAX) packet 17 g  17 g Oral Daily PRN Oswald Hillock, MD      . potassium chloride 10 mEq in 100 mL IVPB  10 mEq Intravenous Q1 Hr x 4 Carole Civil, MD   10 mEq at 04/09/15 1131  . potassium chloride 10 MEQ/100ML IVPB           . potassium chloride SA (K-DUR,KLOR-CON) CR tablet 40 mEq  40 mEq Oral Once Oswald Hillock, MD   40 mEq at 04/09/15 0900  . sucralfate (CARAFATE) 1 GM/10ML suspension 1 g  1 g Oral TID WC & HS Oswald Hillock, MD   1 g at 04/09/15 0800     Physical Exam(=30) Blood pressure 149/73, pulse 97, temperature 98.9 F (37.2 C), temperature source Oral, resp. rate 20, height 5' 3"  (1.6 m), weight 131 lb 11.2 oz (59.739 kg), SpO2 97 %. The patient is well developed well nourished and adequately groomed.  Orientation to person place and time is normal  Mood is pleasant  Ambulatory status is CAN NOT AMBULATE fractured hip   Right and left Upper extremities Skin remains intact without laceration ulceration or erythema Neuro EXAM no focal findings, sensation normal  Vascular exam warm extremities with good capillary refill  Gross motor exam is intact without atrophy. Muscle tone normal grade 5 motor strength Inspection normal alignment Joint range of motion show no significant contractures and all joints are reduced  Right lower extremity Skin remains intact no lacerations or ulcerations Neuro exam shows normal sensation Pulses are normal extremity is warm Muscle tone normal Alignment is normal Joints are reduced and stable No contractures are noted  As far as the left lower extremity the foot is slightly externally rotated the skin is intact the pulses are normal the extremity is warm and she has normal sensation with tenderness over the fracture site minimal swelling of the 5 ankle range of motion is normal knee and hip range of  motion or not attempted to avoid pain for the patient no joint subluxations are noted  Remaining neuro exam coordination by finger to nose  she had trouble performing but I think it was her hearing; reflexes were normal at the elbow     Dx:   Data Reviewed  I interpreted the images as  Left femoral neck fracture with no significant or noticeable arthritis of the joint  CT report was reviewed and there is no acute bleed   BMP Latest Ref Rng 04/09/2015 02/24/2015 02/23/2015  Glucose 65 - 99 mg/dL 136(H) 83 86  BUN 6 - 20 mg/dL 9 7 7   Creatinine 0.44 - 1.00 mg/dL 0.77 0.63 0.65  Sodium 135 - 145 mmol/L 140 137 136  Potassium 3.5 - 5.1 mmol/L 3.3(L) 3.5 3.3(L)  Chloride 101 - 111 mmol/L 106 103 101  CO2 22 - 32 mmol/L 26 25 27   Calcium 8.9 - 10.3 mg/dL 8.9 8.4(L) 8.2(L)    CBC Latest Ref Rng 04/09/2015 03/13/2015 02/24/2015  WBC 4.0 - 10.5 K/uL 13.2(H) 9.3 10.8(H)  Hemoglobin 12.0 - 15.0 g/dL 12.2 12.4 12.5  Hematocrit 36.0 - 46.0 % 36.4 38.0 36.9  Platelets 150 - 400 K/uL 221 258 191      Assessment New patient closed fracture requiring surgery   Left femoral neck fracture Hypokalemia noted on lab data  Plan   Bipolar left hip replacement  You have decided to proceed with hip replacement surgery. You have decided not to continue with nonoperative measures Which would lead to a poor gait pattern if you were able to walk at all We will perform the procedure commonly known as partial hip replacement. Some of the risks associated with hip replacement include but are not limited to Bleeding Infection Swelling Stiffness Blood clot Pain Dislocation LEG LENGTH INEQUALITY REQUIRING A SHOE LIFT  Infection is a devastating complication of joint replacement surgery. If you get an infection the implant usually will have to be removed and several surgeries and antibiotics will be needed to eradicate the infection. In some rare cases the hip cannot be reconstructed with another  implant. This will lead to permanent need for crutches and assistive devices to ambulate.

## 2015-04-09 NOTE — Anesthesia Preprocedure Evaluation (Signed)
Anesthesia Evaluation  Patient identified by MRN, date of birth, ID band Patient awake    Reviewed: Allergy & Precautions, H&P , NPO status , Patient's Chart, lab work & pertinent test results  Airway Mallampati: II  TM Distance: >3 FB     Dental  (+) Edentulous Upper, Edentulous Lower   Pulmonary Current Smoker,    breath sounds clear to auscultation       Cardiovascular hypertension, Pt. on medications  Rhythm:Regular Rate:Normal     Neuro/Psych  Neuromuscular disease (L side weakness) CVA, Residual Symptoms    GI/Hepatic SBO, nauseated    Endo/Other    Renal/GU      Musculoskeletal  (+) Arthritis ,   Abdominal   Peds  Hematology   Anesthesia Other Findings   Reproductive/Obstetrics                             Anesthesia Physical  Anesthesia Plan  ASA: III  Anesthesia Plan: Spinal   Post-op Pain Management:    Induction:   Airway Management Planned: Simple Face Mask  Additional Equipment:   Intra-op Plan:   Post-operative Plan:   Informed Consent: I have reviewed the patients History and Physical, chart, labs and discussed the procedure including the risks, benefits and alternatives for the proposed anesthesia with the patient or authorized representative who has indicated his/her understanding and acceptance.   Dental advisory given  Plan Discussed with: Surgeon and Anesthesiologist  Anesthesia Plan Comments:         Anesthesia Quick Evaluation

## 2015-04-10 ENCOUNTER — Encounter (HOSPITAL_COMMUNITY): Payer: Self-pay | Admitting: Orthopedic Surgery

## 2015-04-10 DIAGNOSIS — K5090 Crohn's disease, unspecified, without complications: Secondary | ICD-10-CM

## 2015-04-10 DIAGNOSIS — D62 Acute posthemorrhagic anemia: Secondary | ICD-10-CM | POA: Insufficient documentation

## 2015-04-10 DIAGNOSIS — K2960 Other gastritis without bleeding: Secondary | ICD-10-CM

## 2015-04-10 DIAGNOSIS — S72009D Fracture of unspecified part of neck of unspecified femur, subsequent encounter for closed fracture with routine healing: Secondary | ICD-10-CM

## 2015-04-10 DIAGNOSIS — F17200 Nicotine dependence, unspecified, uncomplicated: Secondary | ICD-10-CM

## 2015-04-10 DIAGNOSIS — E876 Hypokalemia: Secondary | ICD-10-CM

## 2015-04-10 DIAGNOSIS — K2900 Acute gastritis without bleeding: Secondary | ICD-10-CM

## 2015-04-10 LAB — CBC
HEMATOCRIT: 32.2 % — AB (ref 36.0–46.0)
HEMOGLOBIN: 10.6 g/dL — AB (ref 12.0–15.0)
MCH: 31.5 pg (ref 26.0–34.0)
MCHC: 32.9 g/dL (ref 30.0–36.0)
MCV: 95.5 fL (ref 78.0–100.0)
PLATELETS: 165 10*3/uL (ref 150–400)
RBC: 3.37 MIL/uL — AB (ref 3.87–5.11)
RDW: 13.2 % (ref 11.5–15.5)
WBC: 9.2 10*3/uL (ref 4.0–10.5)

## 2015-04-10 MED ORDER — ACETAMINOPHEN 10 MG/ML IV SOLN
INTRAVENOUS | Status: AC
  Filled 2015-04-10: qty 200

## 2015-04-10 MED ORDER — POLYSACCHARIDE IRON COMPLEX 150 MG PO CAPS
150.0000 mg | ORAL_CAPSULE | Freq: Every day | ORAL | Status: DC
  Administered 2015-04-10 – 2015-04-12 (×3): 150 mg via ORAL
  Filled 2015-04-10 (×3): qty 1

## 2015-04-10 MED ORDER — SODIUM CHLORIDE 0.9 % IV BOLUS (SEPSIS)
250.0000 mL | Freq: Once | INTRAVENOUS | Status: AC
  Administered 2015-04-10: 250 mL via INTRAVENOUS

## 2015-04-10 MED ORDER — MORPHINE SULFATE (PF) 2 MG/ML IV SOLN
2.0000 mg | Freq: Once | INTRAVENOUS | Status: AC
  Administered 2015-04-10: 2 mg via INTRAVENOUS
  Filled 2015-04-10: qty 1

## 2015-04-10 NOTE — Progress Notes (Addendum)
[facility] PROGRESS NOTE  [name] PJS:[id number] DOB: 1946-06-04 DOA: 04/08/2015 PCP: Glo Herring., MD  Assessment/Plan: Hip fracture -pain is mild to moderate (especially with movement) -continue PT/OT -will follow rec's from orthopedic service -will need SNF  Hypokalemia -Repleted -will monitor electrolytes and replete it as needed  -currently on maintenance with potassium in her IV  Hypertension -stable and well controlled -will continue Continue home medications regimen  History of gastric erosions and ulceration -Continue Protonix and sucralfate  History of Crohn disease -Stable continue Delzicol.  Tobacco abuse -cessation counseling provided -started on nicotine patch   Acute blood loss anemia -most likely associated with surgery -will start niferex -no need for transfusion -will follow Hgb trend  Code Status: Full Family Communication: daughter at bedside  Disposition Plan: will need SNF at discharge   Consultants:  Orthopedic service (Dr. Aline Brochure)  Procedures: INSERT PARTIAL HIP REPLACEMENT LEFT (Left) 10/24  Antibiotics:  None   HPI/Subjective: Afebrile, denies CP, SOB and N/V.  Objective: Filed Vitals:   04/10/15 1337  BP: 122/65  Pulse: 96  Temp: 98.5 F (36.9 C)  Resp: 18    Intake/Output Summary (Last 24 hours) at 04/10/15 1351 Last data filed at 04/10/15 1248  Gross per 24 hour  Intake 2451.67 ml  Output   1225 ml  Net 1226.67 ml   Filed Weights   04/08/15 2330 04/09/15 0647 04/09/15 1335  Weight: 57.153 kg (126 lb) 59.739 kg (131 lb 11.2 oz) 59.421 kg (131 lb)    Exam:   General:  Afebrile, complaining of mild to moderate hip pain. No CP or SOB. Had BM this morning   Cardiovascular: S1 and S2, no rubs or gallops  Respiratory: no wheezing, no crackles; good air movement   Abdomen: soft, NT, ND, positive BS  Musculoskeletal: no cyanosis or edema; decrease range of motion on her LLE due to pain in  her hip.  Data Reviewed: Basic Metabolic Panel:  Recent Labs Lab 04/09/15 0056 04/09/15 0941  NA 140  --   K 3.3*  --   CL 106  --   CO2 26  --   GLUCOSE 136*  --   BUN 9  --   CREATININE 0.77  --   CALCIUM 8.9  --   MG  --  1.8   CBC:  Recent Labs Lab 04/09/15 0056 04/10/15 0551  WBC 13.2* 9.2  NEUTROABS 10.8*  --   HGB 12.2 10.6*  HCT 36.4 32.2*  MCV 94.8 95.5  PLT 221 165    Studies: Dg Chest 1 View  04/09/2015  CLINICAL DATA:  69 year old female with generalized hip pain and lateral knee pain. EXAM: CHEST 1 VIEW COMPARISON:  Chest x-ray 02/21/2015. FINDINGS: Lung volumes are normal. No consolidative airspace disease. No pleural effusions. No pneumothorax. No pulmonary nodule or mass noted. Pulmonary vasculature and the cardiomediastinal silhouette are within normal limits. IMPRESSION: No radiographic evidence of acute cardiopulmonary disease. Electronically Signed   By: Vinnie Langton M.D.   On: 04/09/2015 02:13   Ct Head Wo Contrast  04/09/2015  CLINICAL DATA:  Status post fall, landing on left side. Concern for head or cervical spine injury. Initial encounter. EXAM: CT HEAD WITHOUT CONTRAST CT CERVICAL SPINE WITHOUT CONTRAST TECHNIQUE: Multidetector CT imaging of the head and cervical spine was performed following the standard protocol without intravenous contrast. Multiplanar CT image reconstructions of the cervical spine were also generated. COMPARISON:  CT of the head performed 01/21/2002 FINDINGS: CT HEAD FINDINGS There is  no evidence of acute infarction, mass lesion, or intra- or extra-axial hemorrhage on CT. Prominence of ventricles and sulci reflects moderate cortical volume loss. Cerebellar atrophy is noted. Mild periventricular and subcortical white matter change likely reflects small vessel ischemic microangiopathy. A small chronic infarct is noted at the left posterior parietal lobe. The brainstem and fourth ventricle are within normal limits. The basal  ganglia are unremarkable in appearance. No mass effect or midline shift is seen. There is no evidence of fracture; visualized osseous structures are unremarkable in appearance. There is incomplete fusion of the posterior arch of C1. The orbits are within normal limits. The paranasal sinuses and mastoid air cells are well-aerated. No significant soft tissue abnormalities are seen. CT CERVICAL SPINE FINDINGS There is no evidence of fracture or subluxation. Vertebral bodies demonstrate normal height and alignment. There is multilevel disc space narrowing along the lower cervical spine, with scattered anterior and posterior disc osteophyte complexes. Prevertebral soft tissues are within normal limits. The thyroid gland is unremarkable in appearance. The visualized lung apices are clear. Scattered calcification is noted at the carotid bifurcations bilaterally, with likely moderate luminal narrowing on the right side. IMPRESSION: 1. No evidence of traumatic intracranial injury or fracture. 2. No evidence of fracture or subluxation along the cervical spine. 3. Moderate cortical volume loss and scattered small vessel ischemic microangiopathy. 4. Small chronic infarct at the left posterior parietal lobe. 5. Mild degenerative change noted along the lower cervical spine. 6. Scattered calcification at the carotid bifurcations bilaterally, with likely moderate luminal narrowing on the right side. Carotid ultrasound would be helpful for further evaluation, when and as deemed clinically appropriate. Electronically Signed   By: Garald Balding M.D.   On: 04/09/2015 03:27   Dg Pelvis Portable  04/09/2015  CLINICAL DATA:  Postop left hip surgery hip replacement today EXAM: PORTABLE PELVIS 1-2 VIEWS COMPARISON:  None. FINDINGS: Anticipated postoperative change over the soft tissues surrounding the left hip. On single AP view components of left hip replacement appear to be in anticipated position. IMPRESSION: Anticipated  postoperative change Electronically Signed   By: Skipper Cliche M.D.   On: 04/09/2015 17:23   Dg Knee Complete 4 Views Left  04/09/2015  CLINICAL DATA:  Status post fall onto left side. Left lateral knee pain. Initial encounter. EXAM: LEFT KNEE - COMPLETE 4+ VIEW COMPARISON:  None. FINDINGS: There is no evidence of fracture or dislocation. The joint spaces are preserved. No significant degenerative change is seen; the patellofemoral joint is grossly unremarkable in appearance. No significant joint effusion is seen. Mild scattered vascular calcifications are seen. IMPRESSION: No evidence of fracture or dislocation. Electronically Signed   By: Garald Balding M.D.   On: 04/09/2015 02:12   Dg Hip Unilat W Or W/o Pelvis 2-3 Views Left  04/09/2015  CLINICAL DATA:  Status post fall onto left side, with generalized left hip pain. Initial encounter. EXAM: DG HIP (WITH OR WITHOUT PELVIS) 2-3V LEFT COMPARISON:  None. FINDINGS: There is a displaced subcapital fracture of the left femoral neck, with mild comminution. The left femoral head remains seated at the acetabulum. The right hip joint is grossly unremarkable in appearance. Mild degenerative change is noted at the sacroiliac joints. The visualized bowel gas pattern is grossly unremarkable in appearance. IMPRESSION: Displaced subcapital fracture of the left femoral neck, with mild comminution. Electronically Signed   By: Garald Balding M.D.   On: 04/09/2015 02:11    Scheduled Meds: . docusate sodium  100 mg Oral BID  .  enoxaparin (LOVENOX) injection  30 mg Subcutaneous Q24H  . lisinopril  10 mg Oral Daily  . Mesalamine  400 mg Oral BID  . nicotine  14 mg Transdermal Daily  . pantoprazole  40 mg Oral Daily  . polyethylene glycol  17 g Oral Daily  . potassium chloride  40 mEq Oral Once  . sucralfate  1 g Oral TID WC & HS  . traMADol  50 mg Oral 4 times per day   Continuous Infusions: . 0.9 % NaCl with KCl 20 mEq / L 100 mL/hr at 04/09/15 1842     Active Problems:   Crohn's disease ([place])   Tobacco abuse   HTN (hypertension)   Subcapital fracture of hip (HCC)   Hip fracture ([place])   Left displaced femoral neck fracture ([place])   Time spent: 30 minutes   Barton Dubois  [facility] Pager [phone]. If 7PM-7AM, please contact night-coverage at [email or web address], password [facility] 04/10/2015, 1:51 PM  LOS: 1 day

## 2015-04-10 NOTE — NC FL2 (Signed)
[place] LEVEL OF CARE SCREENING TOOL     IDENTIFICATION  Patient Name: [name] Birthdate: [birth year] Sex: female Admission Date (Current Location): 04/08/2015  [facility] [place] Number:    Facility and Address:      Provider Number: [id number]   Attending Physician Name and Address:  Barton Dubois, MD  Relative Name and Address:       Current Level of Care: Hospital Recommended Level of Care: [place] Prior Approval Number:    Date Approved/Denied:   PASRR Number: [id number]   Discharge Plan: SNF    Current Diagnoses: Patient Active Problem List   Diagnosis Date Noted  . Subcapital fracture of hip ([place]) 04/09/2015  . Hip fracture ([place]) 04/09/2015  . Left displaced femoral neck fracture ([place]) 04/09/2015  . Gastric ulceration   . Upper GI bleed   . UTI (lower urinary tract infection) 02/21/2015  . Trichomonal infection 02/21/2015  . Small bowel obstruction ([place]) 05/14/2013  . Ventral hernia 06/03/2011  . HTN (hypertension) 06/03/2011  . SBO (small bowel obstruction) ([place]) 05/31/2011  . Crohn's disease ([place]) 05/31/2011  . Tobacco abuse 05/31/2011    DISORIENTED AMBULATORY STATUS BLADDER BOWEL  Constantly (Oriented x4)  (transfers with assist) Continent Continent  INAPPROPRIATE BEHAVIOR FUNCTIONAL LIMITATIONS COMMUNICATION OF NEEDS RESPIRATION      Verbally O2 (Cont.) (2 L)  PERSONAL CARE ASSISTANCE ACTIVITIES/SOCIAL SKIN NUTRITION STATUS  Dressing, Bathing   Other (Comment) (surgical wounds) Weight, Height, Diet (63" 131 lb  Diet: Heart healthy)  PHYSICIAN VISITS NEUROLOGICAL            SPECIAL CARE FACTORS FREQUENCY  PT (By licensed PT)     PT Frequency: 5             Current Medications (04/10/2015): Current Facility-Administered Medications  Medication Dose Route Frequency Provider Last Rate Last Dose  . 0.9 % NaCl with KCl 20 mEq/ L  infusion   Intravenous Continuous Carole Civil, MD 100 mL/hr at 04/09/15  1842    . acetaminophen (OFIRMEV) IV 1,000 mg  1,000 mg Intravenous 4 times per day Carole Civil, MD   1,000 mg at 04/10/15 0503  . bisacodyl (DULCOLAX) suppository 10 mg  10 mg Rectal Daily PRN Carole Civil, MD      . docusate sodium (COLACE) capsule 100 mg  100 mg Oral BID Carole Civil, MD   100 mg at 04/10/15 0859  . enoxaparin (LOVENOX) injection 30 mg  30 mg Subcutaneous Q24H Carole Civil, MD   30 mg at 04/10/15 0857  . HYDROcodone-acetaminophen (NORCO/VICODIN) 5-325 MG per tablet 1-2 tablet  1-2 tablet Oral Q6H PRN Oswald Hillock, MD   2 tablet at 04/09/15 2232  . lisinopril (PRINIVIL,ZESTRIL) tablet 10 mg  10 mg Oral Daily Oswald Hillock, MD   10 mg at 04/09/15 0911  . magnesium citrate solution 1 Bottle  1 Bottle Oral Once PRN Carole Civil, MD      . menthol-cetylpyridinium (CEPACOL) lozenge 3 mg  1 lozenge Oral PRN Carole Civil, MD       Or  . phenol (CHLORASEPTIC) mouth spray 1 spray  1 spray Mouth/Throat PRN Carole Civil, MD      . Mesalamine (ASACOL) DR capsule 400 mg  400 mg Oral BID Oswald Hillock, MD   400 mg at 04/10/15 0858  . methocarbamol (ROBAXIN) tablet 500 mg  500 mg Oral Q6H PRN Oswald Hillock, MD   500  mg at 04/10/15 0953   Or  . methocarbamol (ROBAXIN) 500 mg in dextrose 5 % 50 mL IVPB  500 mg Intravenous Q6H PRN Oswald Hillock, MD      . metoCLOPramide (REGLAN) tablet 5-10 mg  5-10 mg Oral Q8H PRN Carole Civil, MD       Or  . metoCLOPramide (REGLAN) injection 5-10 mg  5-10 mg Intravenous Q8H PRN Carole Civil, MD      . morphine 2 MG/ML injection 0.5 mg  0.5 mg Intravenous Q2H PRN Oswald Hillock, MD   0.5 mg at 04/10/15 0856  . nicotine (NICODERM CQ - dosed in mg/24 hours) patch 14 mg  14 mg Transdermal Daily Barton Dubois, MD   14 mg at 04/10/15 0859  . ondansetron (ZOFRAN) tablet 4 mg  4 mg Oral Q6H PRN Carole Civil, MD       Or  . ondansetron [facility]) injection 4 mg  4 mg Intravenous Q6H PRN Carole Civil, MD   4  mg at 04/10/15 0513  . pantoprazole (PROTONIX) EC tablet 40 mg  40 mg Oral Daily Oswald Hillock, MD   40 mg at 04/10/15 0858  . polyethylene glycol (MIRALAX / GLYCOLAX) packet 17 g  17 g Oral Daily PRN Oswald Hillock, MD      . polyethylene glycol (MIRALAX / GLYCOLAX) packet 17 g  17 g Oral Daily Carole Civil, MD   17 g at 04/10/15 0859  . potassium chloride SA (K-DUR,KLOR-CON) CR tablet 40 mEq  40 mEq Oral Once Oswald Hillock, MD   40 mEq at 04/09/15 0900  . sucralfate (CARAFATE) 1 GM/10ML suspension 1 g  1 g Oral TID WC & HS Oswald Hillock, MD   1 g at 04/10/15 0857  . traMADol (ULTRAM) tablet 50 mg  50 mg Oral 4 times per day Carole Civil, MD   50 mg at 04/10/15 0502   Do not use this list as official medication orders. Please verify with discharge summary.  Discharge Medications:   Medication List    ASK your doctor about these medications        acetaminophen 500 MG tablet  Commonly known as:  TYLENOL  Take 1,000 mg by mouth daily as needed for mild pain or moderate pain.     aspirin EC 81 MG tablet  Take 1 tablet (81 mg total) by mouth daily. Resume in 1 week     DELZICOL 400 MG Cpdr DR capsule  Generic drug:  Mesalamine  TAKE 2 CAPSULES BY MOUTH TWICE DAILY.     diphenhydrAMINE 25 mg capsule  Commonly known as:  BENADRYL  Take 25 mg by mouth every 6 (six) hours as needed for itching.     lisinopril 20 MG tablet  Commonly known as:  PRINIVIL,ZESTRIL  Take 10 mg by mouth daily.     omeprazole 20 MG capsule  Commonly known as:  PRILOSEC  Take 1 capsule (20 mg total) by mouth 2 (two) times daily before a meal.     ondansetron 4 MG tablet  Commonly known as:  ZOFRAN  Take 4 mg by mouth every 8 (eight) hours as needed for nausea or vomiting.     polyethylene glycol packet  Commonly known as:  MIRALAX  Take 17 g by mouth daily as needed for mild constipation.     sucralfate 1 GM/10ML suspension  Commonly known as:  CARAFATE  Take 10 mLs (1 g total)  by mouth 4  (four) times daily -  with meals and at bedtime.        Relevant Imaging Results:  Relevant Lab Results:  Recent Labs    Additional Information    [name], [place]

## 2015-04-10 NOTE — Progress Notes (Signed)
Pt and family request to take foley out later this morning. Family and pt stated she wanted to sleep a little bit longer. Will notify first shift RN.

## 2015-04-10 NOTE — Clinical Social Work Placement (Signed)
   CLINICAL SOCIAL WORK PLACEMENT  NOTE  Date:  04/10/2015  Patient Details  Name: [name] MRN: [id number] Date of Birth: May 28, 1946  Clinical Social Work is seeking post-discharge placement for this patient at the [place] level of care (*CSW will initial, date and re-position this form in  chart as items are completed):  Yes   Patient/family provided with [place] Work Department's list of facilities offering this level of care within the geographic area requested by the patient (or if unable, by the patient's family).  Yes   Patient/family informed of their freedom to choose among providers that offer the needed level of care, that participate in Medicare, Medicaid or managed care program needed by the patient, have an available bed and are willing to accept the patient.  Yes   Patient/family informed of [facility]'s ownership interest in [facility] and [facility], as well as of the fact that they are under no obligation to receive care at these facilities.  PASRR submitted to EDS on 04/09/15     PASRR number received on 04/09/15     Existing PASRR number confirmed on       FL2 transmitted to all facilities in geographic area requested by pt/family on 04/10/15     FL2 transmitted to all facilities within larger geographic area on       Patient informed that his/her managed care company has contracts with or will negotiate with certain facilities, including the following:            Patient/family informed of bed offers received.  Patient chooses bed at       Physician recommends and patient chooses bed at      Patient to be transferred to   on  .  Patient to be transferred to facility by       Patient family notified on   of transfer.  Name of family member notified:        PHYSICIAN       Additional Comment:    _______________________________________________ [name], LCSW 04/10/2015, 11:09  AM [phone]

## 2015-04-10 NOTE — Addendum Note (Signed)
Addendum  created 04/10/15 1334 by Ollen Bowl, CRNA   Modules edited: Notes Section   Notes Section:  File: [id number]

## 2015-04-10 NOTE — Clinical Social Work Note (Signed)
Clinical Social Work Assessment  Patient Details  Name: [name] MRN: [id number] Date of Birth: August 27, 1945  Date of referral:  04/10/15               Reason for consult:  Facility Placement                Permission sought to share information with:  Family Supports Permission granted to share information::     Name::     Wellsite geologist::     Relationship::  daughter  Contact Information:     Housing/Transportation Living arrangements for the past 2 months:  Single Family Home Source of Information:  Patient, Adult Children Patient Interpreter Needed:  None Criminal Activity/Legal Involvement Pertinent to Current Situation/Hospitalization:  No - Comment as needed Significant Relationships:  Adult Children Lives with:  Adult Children, Siblings Do you feel safe going back to the place where you live?  No Need for family participation in patient care:  Yes (Comment)  Care giving concerns:  Pt is independent at baseline. Will need SNF.    Social Worker assessment / plan:  CSW met with pt and pt's daughter, [name] at bedside. Pt alert and oriented and states that she lives with [name] and her sister. Pt was outside Sunday night checking on her cat when she fell, fracturing her hip. Her sister was looking outside and they called EMS. Pt is post op day 1 and has worked with PT. Recommendation is for SNF. CSW discussed SNF process, including Medicare coverage/criteria. Pt initially thought that this would be outpatient, but after further discussion, she indicates she understands she will stay at rehab and agrees. At baseline, pt is independent and still drives. She intends to be very short term at SNF and then return home. SNF list provided.   Employment status:  Retired Forensic scientist:  [employer] PT Recommendations:  [name] / Referral to community resources:  [place]  Patient/Family's Response to care:  Pt states, "I don't want to go,  but I need to."   Patient/Family's Understanding of and Emotional Response to Diagnosis, Current Treatment, and Prognosis:  Pt and family aware of treatment plan, with SNF as part of that plan. They requests [facility] if possible.   Emotional Assessment Appearance:  Appears older than stated age Attitude/Demeanor/Rapport:  Apprehensive Affect (typically observed):  Accepting Orientation:  Oriented to Self, Oriented to Place, Oriented to  Time, Oriented to Situation Alcohol / Substance use:  Not Applicable Psych involvement (Current and /or in the community):  No (Comment)  Discharge Needs  Concerns to be addressed:  Discharge Planning Concerns Readmission within the last 30 days:  No Current discharge risk:  Physical Impairment Barriers to Discharge:  Continued Medical Work up   [name], [place] 04/10/2015, 11:54 AM [phone]

## 2015-04-10 NOTE — Progress Notes (Signed)
Subjective: 1 Day Post-Op Procedure(s) (LRB): INSERT PARTIAL HIP REPLACEMENT LEFT (Left) Mild pain   Objective: Vital signs in last 24 hours: Temp:  [98 F (36.7 C)-99.6 F (37.6 C)] 98.1 F (36.7 C) (10/25 0200) Pulse Rate:  [82-104] 87 (10/25 0200) Resp:  [13-22] 18 (10/25 0200) BP: (87-188)/(46-111) 120/62 mmHg (10/25 0200) SpO2:  [88 %-100 %] 96 % (10/25 0200) Weight:  [131 lb (59.421 kg)] 131 lb (59.421 kg) (10/24 1335)  Intake/Output from previous day: 10/24 0701 - 10/25 0700 In: 2211.7 [I.V.:1911.7; IV Piggyback:300] Out: 2425 [Urine:2400; Blood:25] Intake/Output this shift:     Recent Labs  04/09/15 0056 04/10/15 0551  HGB 12.2 10.6*    Recent Labs  04/09/15 0056 04/10/15 0551  WBC 13.2* 9.2  RBC 3.84* 3.37*  HCT 36.4 32.2*  PLT 221 165    Recent Labs  04/09/15 0056  NA 140  K 3.3*  CL 106  CO2 26  BUN 9  CREATININE 0.77  GLUCOSE 136*  CALCIUM 8.9    Recent Labs  04/09/15 0056  INR 1.01    Neurologically intact Neurovascular intact Sensation intact distally Intact pulses distally Dorsiflexion/Plantar flexion intact  Assessment/Plan: 1 Day Post-Op Procedure(s) (LRB): INSERT PARTIAL HIP REPLACEMENT LEFT (Left) Advance diet Up with therapy D/C IV fluids  Arther Abbott 04/10/2015, 7:46 AM

## 2015-04-10 NOTE — Anesthesia Postprocedure Evaluation (Signed)
  Anesthesia Post-op Note  Patient: [name]  Procedure(s) Performed: Procedure(s): INSERT PARTIAL HIP REPLACEMENT LEFT (Left)  Patient Location: Nursing Unit  Anesthesia Type:Spinal  Level of Consciousness: awake, alert  and oriented  Airway and Oxygen Therapy: Patient Spontanous Breathing  Post-op Pain: mild  Post-op Assessment: Post-op Vital signs reviewed, Patient's Cardiovascular Status Stable, Respiratory Function Stable, Patent Airway, No signs of Nausea or vomiting and Adequate PO intake LLE Motor Response: No movement to painful stimulus LLE Sensation: No sensation (absent) RLE Motor Response: No movement to painful stimulus RLE Sensation: No sensation (absent) L Sensory Level: T10-Umbilical region R Sensory Level: T10-Umbilical region  Post-op Vital Signs: Reviewed and stable  Last Vitals:  Filed Vitals:   04/10/15 0805  BP: 96/74  Pulse: 91  Temp: 36.5 C  Resp: 18    Complications: No apparent anesthesia complications

## 2015-04-10 NOTE — Evaluation (Signed)
Physical Therapy Evaluation Patient Details Name: [name] MRN: [id number] DOB: March 29, 1946 Today's Date: 04/10/2015   History of Present Illness  69 year old female who  has a past medical history of Crohn's disease ([place]); Hypertension; Stroke [facility]); Arthritis; Sciatica of right side; Degenerative disc disease, lumbar; and Ventral hernia.  She fell at home and sustained a left hip fracture.  She underwent left partial hip replacement on 04-09-15, lateral approach.  Clinical Impression   Pt was seen for initial evaluation/treatment.  She was alert and oriented but c/o severe hip pain even though she had had oral pain med and morphine just prior to PT visit.  She was instructed in lateral approach THR precautions and we initiated therapeutic exercise per protocol.  She was able to tolerate ankle ROM and isometrics but would not allow any supine hip flexion.  It required total assist to transfer her to the edge of bed and max assist to stand to a walker.  She was able to transfer bed to chair using a walker with a very slow and labored effort.  She is currently up in a recliner and admits to being comfortable.  She will very much need SNF at d/c.    Follow Up Recommendations SNF    Equipment Recommendations  None recommended by PT    Recommendations for Other Services OT consult     Precautions / Restrictions Precautions Precaution Comments: direct lateral approach precautions Restrictions Weight Bearing Restrictions: No      Mobility  Bed Mobility Overal bed mobility: Needs Assistance Bed Mobility: Supine to Sit     Supine to sit: Total assist;HOB elevated        Transfers Overall transfer level: Needs assistance Equipment used: Rolling walker (2 wheeled) Transfers: Sit to/from [employer] Transfers Sit to Stand: Max assist Stand pivot transfers: Mod assist       General transfer comment: pt transferred to chair using a walker, extremely slow and labored with  great difficulty moving either foot  Ambulation/Gait    unable                                   Balance Overall balance assessment: History of Falls                                           Pertinent Vitals/Pain Pain Assessment: 0-10 Pain Score: 10-Worst pain ever Pain Location: left hip with any movement Pain Descriptors / Indicators: Aching Pain Intervention(s): Limited activity within patient's tolerance;Monitored during session;Premedicated before session;RN gave pain meds during session    Home Living Family/patient expects to be discharged to:: Skilled nursing facility                      Prior Function Level of Independence: Independent                       Extremity/Trunk Assessment               Lower Extremity Assessment: RLE deficits/detail;LLE deficits/detail RLE Deficits / Details: RLE is WNL LLE Deficits / Details: significant limitation of ROM left hip due to pain     Communication   Communication: No difficulties  Cognition Arousal/Alertness: Awake/alert Behavior During Therapy: WFL for tasks assessed/performed Overall Cognitive Status: Within Functional  Limits for tasks assessed                            Exercises Total Joint Exercises Ankle Circles/Pumps: AROM;Both;10 reps;Supine Quad Sets: AROM;Both;10 reps;Supine Gluteal Sets: AROM;Both;10 reps;Supine Short Arc Quad: AROM;AAROM;Both;10 reps;Supine Heel Slides: AAROM;10 reps;Supine;Right (pt would not allow any left hip flexion with this exercise) Hip ABduction/ADduction: AROM;Right;10 reps;Supine      Assessment/Plan    PT Assessment Patient needs continued PT services  PT Diagnosis Difficulty walking;Acute pain   PT Problem List Decreased strength;Decreased range of motion;Decreased activity tolerance;Decreased mobility;Decreased knowledge of use of DME;Decreased safety awareness;Decreased knowledge of  precautions;Pain  PT Treatment Interventions DME instruction;Gait training;Functional mobility training;Therapeutic exercise   PT Goals (Current goals can be found in the Care Plan section) Acute Rehab PT Goals Patient Stated Goal: wants to go back home PT Goal Formulation: With patient/family Time For Goal Achievement: 04/24/15 Potential to Achieve Goals: Good    Frequency Min 6X/week   Barriers to discharge   none                   End of Session Equipment Utilized During Treatment: Gait belt Activity Tolerance: Patient limited by pain Patient left: in chair;with call bell/phone within reach;with family/visitor present;with nursing/sitter in room Nurse Communication: Mobility status         Time: 0910-1005 PT Time Calculation (min) (ACUTE ONLY): 55 min   Charges:   PT Evaluation $Initial PT Evaluation Tier I: 1 Procedure PT Treatments $Therapeutic Exercise: 8-22 mins   PT G CodesSable Feil  PT 04/10/2015, 10:19 AM [phone]

## 2015-04-11 DIAGNOSIS — S72002D Fracture of unspecified part of neck of left femur, subsequent encounter for closed fracture with routine healing: Secondary | ICD-10-CM

## 2015-04-11 LAB — CBC
HCT: 29.6 % — ABNORMAL LOW (ref 36.0–46.0)
Hemoglobin: 9.9 g/dL — ABNORMAL LOW (ref 12.0–15.0)
MCH: 31.9 pg (ref 26.0–34.0)
MCHC: 33.4 g/dL (ref 30.0–36.0)
MCV: 95.5 fL (ref 78.0–100.0)
PLATELETS: 147 10*3/uL — AB (ref 150–400)
RBC: 3.1 MIL/uL — AB (ref 3.87–5.11)
RDW: 12.9 % (ref 11.5–15.5)
WBC: 10.2 10*3/uL (ref 4.0–10.5)

## 2015-04-11 LAB — BASIC METABOLIC PANEL
Anion gap: 4 — ABNORMAL LOW (ref 5–15)
BUN: 7 mg/dL (ref 6–20)
CALCIUM: 8 mg/dL — AB (ref 8.9–10.3)
CO2: 26 mmol/L (ref 22–32)
CREATININE: 0.65 mg/dL (ref 0.44–1.00)
Chloride: 103 mmol/L (ref 101–111)
GLUCOSE: 84 mg/dL (ref 65–99)
Potassium: 4 mmol/L (ref 3.5–5.1)
Sodium: 133 mmol/L — ABNORMAL LOW (ref 135–145)

## 2015-04-11 MED ORDER — ACETAMINOPHEN 325 MG PO TABS: 650.0000 mg | ORAL_TABLET | Freq: Four times a day (QID) | ORAL | Status: DC | PRN

## 2015-04-11 MED ORDER — ENOXAPARIN SODIUM 40 MG/0.4ML ~~LOC~~ SOLN
40.0000 mg | SUBCUTANEOUS | Status: DC
Start: 2015-04-12 — End: 2015-04-12
  Administered 2015-04-12: 40 mg via SUBCUTANEOUS
  Filled 2015-04-11: qty 0.4

## 2015-04-11 NOTE — Progress Notes (Signed)
Postoperative note  Postoperative day number  2  Status post left bipolar   Vital signs  BP 128/72 mmHg  Pulse 95  Temp(Src) 98.9 F (37.2 C) (Oral)  Resp 18  Ht 5' 3"  (1.6 m)  Wt 131 lb (59.421 kg)  BMI 23.21 kg/m2  SpO2 96%   Pertinent labs   CBC Latest Ref Rng 04/11/2015 04/10/2015 04/09/2015  WBC 4.0 - 10.5 K/uL 10.2 9.2 13.2(H)  Hemoglobin 12.0 - 15.0 g/dL 9.9(L) 10.6(L) 12.2  Hematocrit 36.0 - 46.0 % 29.6(L) 32.2(L) 36.4  Platelets 150 - 400 K/uL 147(L) 165 221    BMP Latest Ref Rng 04/11/2015 04/09/2015 02/24/2015  Glucose 65 - 99 mg/dL 84 136(H) 83  BUN 6 - 20 mg/dL 7 9 7   Creatinine 0.44 - 1.00 mg/dL 0.65 0.77 0.63  Sodium 135 - 145 mmol/L 133(L) 140 137  Potassium 3.5 - 5.1 mmol/L 4.0 3.3(L) 3.5  Chloride 101 - 111 mmol/L 103 106 103  CO2 22 - 32 mmol/L 26 26 25   Calcium 8.9 - 10.3 mg/dL 8.0(L) 8.9 8.4(L)      Patient complaints  Left hip pain   Physical exam  Left leg neurovascular motor intact   Assessment and Plan   Continue PT  WBAT

## 2015-04-11 NOTE — Evaluation (Signed)
Occupational Therapy Evaluation Patient Details Name: [name] MRN: [id number] DOB: [birth year] Today's Date: 04/11/2015    History of Present Illness 69 year old female who  has a past medical history of Crohn's disease ([place]); Hypertension; Stroke [facility]); Arthritis; Sciatica of right side; Degenerative disc disease, lumbar; and Ventral hernia.  She fell at home and sustained a left hip fracture.  She underwent left partial hip replacement on 04-09-15, lateral approach.   Clinical Impression   Pt awake and alert this afternoon, daughter in room with pt. Pt complaining of pain in left hip area this afternoon. Pt requires assistance for ADL completion and functional mobility due to pain, lateral hip precautions, and decreased safety. Observed nsg assist with toileting tasks, pt required assistance for rolling side-to-side and verbal cuing for sequencing. Discussed discharge to SNF with pt, answered all questions. Pt will benefit from SNF to increase independence and safety during ADL and functional mobility tasks. Recommend OT evaluation at SNF to further assess OT needs and ADL completion.     Follow Up Recommendations  SNF;Supervision/Assistance - 24 hour    Equipment Recommendations  None recommended by OT       Precautions / Restrictions Precautions Precaution Comments: direct lateral approach precautions Restrictions Weight Bearing Restrictions: Yes (WBAT)      Mobility Bed Mobility               General bed mobility comments: not tested  Transfers                 General transfer comment: not tested         ADL Overall ADL's : Needs assistance/impaired                                       General ADL Comments: Pt limited in ADL completion due to pain in left hip. Pt will require assistance and education on compensatory strategies and use of AE for LB dressing and bathing due to lateral hip precautions.      Vision Vision  Assessment?: No apparent visual deficits          Pertinent Vitals/Pain Pain Assessment: Faces Faces Pain Scale: Hurts little more Pain Location: left hip Pain Descriptors / Indicators: Aching Pain Intervention(s): Limited activity within patient's tolerance;Monitored during session     Hand Dominance Right   Extremity/Trunk Assessment Upper Extremity Assessment Upper Extremity Assessment: Overall WFL for tasks assessed   Lower Extremity Assessment Lower Extremity Assessment: Defer to PT evaluation       Communication Communication Communication: No difficulties   Cognition Arousal/Alertness: Awake/alert Behavior During Therapy: WFL for tasks assessed/performed Overall Cognitive Status: Within Functional Limits for tasks assessed                                Home Living Family/patient expects to be discharged to:: Skilled nursing facility Living Arrangements: Children                                      Prior Functioning/Environment Level of Independence: Independent             OT Diagnosis: Acute pain;Generalized weakness   OT Problem List: Impaired balance (sitting and/or standing);Decreased knowledge of use of DME or AE;Decreased knowledge of precautions;Pain  End of Session    Activity Tolerance: Patient limited by pain Patient left: in bed;with call bell/phone within reach;with family/visitor present   Time: 1450-1519 OT Time Calculation (min): 29 min Charges:  OT General Charges $OT Visit: 1 Procedure OT Evaluation $Initial OT Evaluation Tier I: 1 Procedure  Guadelupe Sabin, OTR/L  [phone]  04/11/2015, 4:15 PM

## 2015-04-11 NOTE — Clinical Social Work Placement (Signed)
   CLINICAL SOCIAL WORK PLACEMENT  NOTE  Date:  04/11/2015  Patient Details  Name: [name] MRN: [id number] Date of Birth: 11/04/1945  Clinical Social Work is seeking post-discharge placement for this patient at the [place] level of care (*CSW will initial, date and re-position this form in  chart as items are completed):  Yes   Patient/family provided with [place] Work Department's list of facilities offering this level of care within the geographic area requested by the patient (or if unable, by the patient's family).  Yes   Patient/family informed of their freedom to choose among providers that offer the needed level of care, that participate in Medicare, Medicaid or managed care program needed by the patient, have an available bed and are willing to accept the patient.  Yes   Patient/family informed of [facility]'s ownership interest in [facility] and [facility], as well as of the fact that they are under no obligation to receive care at these facilities.  PASRR submitted to EDS on 04/09/15     PASRR number received on 04/09/15     Existing PASRR number confirmed on       FL2 transmitted to all facilities in geographic area requested by pt/family on 04/10/15     FL2 transmitted to all facilities within larger geographic area on       Patient informed that his/her managed care company has contracts with or will negotiate with certain facilities, including the following:        Yes   Patient/family informed of bed offers received.  Patient chooses bed at [facility]     Physician recommends and patient chooses bed at      Patient to be transferred to [facility] on  .  Patient to be transferred to facility by       Patient family notified on   of transfer.  Name of family member notified:        PHYSICIAN       Additional Comment:    _______________________________________________ [name], LCSW 04/11/2015, 10:49 AM [phone]

## 2015-04-11 NOTE — Plan of Care (Signed)
Problem: Phase I Progression Outcomes Goal: Pain controlled with appropriate interventions Outcome: Progressing Pt has required pain medication consistently  Goal: Incision/dressings dry and intact Outcome: Progressing Dressings have old dry drainage.

## 2015-04-11 NOTE — Progress Notes (Signed)
PROGRESS NOTE  Annette SCHAAD BWG:[id number] DOB: [birth year] DOA: 04/08/2015 PCP: Glo Herring., MD  Summary: 64 yof with a hx of chrohn's disease, HTN, prior CVA, and DDD presented s/p a mechanical fall that occurred. Patient complained of pain in her left hip and knee. While in the ED, CT head and cervical spine were negative. Xray of the left hip revealed a subcapital fracture of the femur. She was admitted for further management and had a partial left hip replacement performed on 10/24 by Dr. Aline Brochure.    Assessment/Plan: 1. Acute left hip fracture. Partial left hip replacement performed 10/24 by Dr. Aline Brochure. Continue PT/OT. Anticipate SNF upon discharge.  2. Hypokalemia, repleted.  3. Essential HTN, stable. Continue home medications. 4. Acute blood loss anemia, likely associated with surgery and recent fx. Hgb  9.9. Continue to monitor. 5. Hx of gastric erosions and ulcerations, continue PPI.  6. Hx of Crohn's disease. Stable. Continue Delzicol. 7. Tobacco abuse. Counseled on the importance of cessation.    Overall improved. Anticipate discharge to SNF tomorrow.   Code Status: Full DVT prophylaxis: Lovenox Family Communication: Daughter at bedside. Discussed with patient who understands and has no concerns at this time. Disposition Plan: Anticipate discharge to SNF tomorrow.   Murray Hodgkins, MD  [facility]  Pager [phone] If 7PM-7AM, please contact night-coverage at [email or web address], password [email or web address] 04/11/2015, 7:56 AM  LOS: 2 days   Consultants:  Orthopedic surgery  PT- SNF  Procedures:  Partial left hip replacement 10/24.   Antibiotics:    HPI/Subjective: Feels okay. Only has pain when she tries to get up. Has some nausea but denies vomiting.   Objective: Filed Vitals:   04/10/15 1337 04/10/15 2110 04/11/15 0150 04/11/15 0604  BP: 122/65 126/74  128/72  Pulse: 96 110  95  Temp: 98.5 F (36.9 C) 100.6 F (38.1 C) 98.7 F (37.1 C) 98.9 F (37.2 C)   TempSrc: Oral Oral Oral Oral  Resp: 18 18  18   Height:      Weight:      SpO2: 95% 95%  96%    Intake/Output Summary (Last 24 hours) at 04/11/15 0756 Last data filed at 04/11/15 0510  Gross per 24 hour  Intake 2582.92 ml  Output      0 ml  Net 2582.92 ml     Filed Weights   04/08/15 2330 04/09/15 0647 04/09/15 1335  Weight: 57.153 kg (126 lb) 59.739 kg (131 lb 11.2 oz) 59.421 kg (131 lb)    Exam:    VSS, afebrile General:  Appears comfortable, calm. Cardiovascular: Regular rate and rhythm. 2/6 systolic murmur RUSB. No rub or gallop. No lower extremity edema. Respiratory: Clear to auscultation bilaterally, no wheezes, rales or rhonchi. Normal respiratory effort. Psychiatric: grossly normal mood and affect, speech fluent and appropriate Neurologic: grossly non-focal.  New data reviewed:  BMP unremarkable  Hgb slightly lower 9.9,  Platelets 147.   Pertinent data since admission:  Left hip xray IMPRESSION: Displaced subcapital fracture of the left femoral neck, with mild comminution.   Pending data:  BC  Scheduled Meds: . docusate sodium  100 mg Oral BID  . enoxaparin (LOVENOX) injection  30 mg Subcutaneous Q24H  . iron polysaccharides  150 mg Oral Daily  . lisinopril  10 mg Oral Daily  . Mesalamine  400 mg Oral BID  . nicotine  14 mg Transdermal Daily  . pantoprazole  40 mg Oral Daily  . polyethylene glycol  17 g Oral Daily  . sucralfate  1 g Oral TID WC & HS  . traMADol  50 mg Oral 4 times per day   Continuous Infusions: . 0.9 % NaCl with KCl 20 mEq / L 75 mL/hr at 04/10/15 1705    Principal Problem:   Left displaced femoral neck fracture (HCC) Active Problems:   Crohn's disease ([place])   Tobacco abuse   HTN (hypertension)   Subcapital fracture of hip (HCC)   Hip fracture (HCC)   Postoperative anemia due to acute blood loss   Time spent 20  minutes  By signing my name below, I, Rosalie Doctor attest that this documentation has been  prepared under the direction and in the presence of Murray Hodgkins, MD Electronically signed: Rosalie Doctor, Scribe.  04/11/2015 1:22pm  I personally performed the services described in this documentation. All medical record entries made by the scribe were at my direction. I have reviewed the chart and agree that the record reflects my personal performance and is accurate and complete. Murray Hodgkins, MD

## 2015-04-11 NOTE — Progress Notes (Signed)
Physical Therapy Treatment Patient Details Name: [name] MRN: [id number] DOB: 12/26/1945 Today's Date: 04/11/2015    History of Present Illness 69 year old female who  has a past medical history of Crohn's disease ([place]); Hypertension; Stroke [facility]); Arthritis; Sciatica of right side; Degenerative disc disease, lumbar; and Ventral hernia.  She fell at home and sustained a left hip fracture.  She underwent left partial hip replacement on 04-09-15, lateral approach.    PT Comments    Pt awake and willing to participate with therapy today.  Pt stated no real pain while laying still in bed though did report increased pain with standing to 8-9/10.  Pt stated need for restroom, stand pivot transfer complete with mod to max assistance required and constant cueing for hand and foot placement with standing and prior sitting.  Pt able to make bowel movement and reports some relief following.  End of session pt left in bed with call bell within reach, repositioned for comfort with adductor placed between legs and compression replaced on LE, family members in room.  No reports of pain when supine in bed.     Follow Up Recommendations        Equipment Recommendations       Recommendations for Other Services       Precautions / Restrictions Precautions Precaution Comments: direct lateral approach precautions Restrictions Weight Bearing Restrictions: No    Mobility  Bed Mobility Overal bed mobility: Needs Assistance Bed Mobility: Supine to Sit     Supine to sit: Max assist;HOB elevated     General bed mobility comments: not tested  Transfers Overall transfer level: Needs assistance Equipment used: None Transfers: Stand Pivot Transfers Sit to Stand: Max assist Stand pivot transfers: Max assist       General transfer comment: Multiple verbal cueing required for technique to assist wtih transfer to [facility]  Ambulation/Gait                 Stairs            Wheelchair  Mobility    Modified Rankin (Stroke Patients Only)       Balance                                    Cognition Arousal/Alertness: Awake/alert Behavior During Therapy: WFL for tasks assessed/performed Overall Cognitive Status: Within Functional Limits for tasks assessed                      Exercises Total Joint Exercises Ankle Circles/Pumps: AROM;Both;10 reps;Supine Heel Slides: AAROM;10 reps;Supine;Right    General Comments        Pertinent Vitals/Pain Pain Assessment: 0-10 Pain Score: 9  Faces Pain Scale: Hurts little more Pain Location: left hip pain scale 0/10 NWB; 9/10 with WB Pain Descriptors / Indicators: Aching Pain Intervention(s): Limited activity within patient's tolerance;Monitored during session    Home Living Family/patient expects to be discharged to:: Skilled nursing facility Living Arrangements: Children                  Prior Function Level of Independence: Independent          PT Goals (current goals can now be found in the care plan section) Progress towards PT goals: Progressing toward goals    Frequency       PT Plan Current plan remains appropriate    Co-evaluation  End of Session Equipment Utilized During Treatment: Gait belt Activity Tolerance: Patient limited by pain Patient left: in bed;with call bell/phone within reach;with bed alarm set;with family/visitor present     Time: 1610-9604 PT Time Calculation (min) (ACUTE ONLY): 36 min  Charges:  $Gait Training: 8-22 mins $Therapeutic Exercise: 8-22 mins                    G Codes:      Aldona Lento 04/11/2015, 6:36 PM

## 2015-04-12 ENCOUNTER — Non-Acute Institutional Stay (SKILLED_NURSING_FACILITY): Payer: Medicare Other | Admitting: Internal Medicine

## 2015-04-12 ENCOUNTER — Inpatient Hospital Stay
Admission: RE | Admit: 2015-04-12 | Discharge: 2015-04-23 | Disposition: A | Discharge status: Home / Self Care | Payer: PRIVATE HEALTH INSURANCE | Source: Ambulatory Visit | Attending: Internal Medicine | Admitting: Internal Medicine

## 2015-04-12 DIAGNOSIS — K5090 Crohn's disease, unspecified, without complications: Secondary | ICD-10-CM | POA: Diagnosis not present

## 2015-04-12 DIAGNOSIS — R278 Other lack of coordination: Secondary | ICD-10-CM | POA: Diagnosis not present

## 2015-04-12 DIAGNOSIS — S72002D Fracture of unspecified part of neck of left femur, subsequent encounter for closed fracture with routine healing: Secondary | ICD-10-CM

## 2015-04-12 DIAGNOSIS — R262 Difficulty in walking, not elsewhere classified: Secondary | ICD-10-CM | POA: Diagnosis not present

## 2015-04-12 DIAGNOSIS — K5000 Crohn's disease of small intestine without complications: Secondary | ICD-10-CM | POA: Diagnosis not present

## 2015-04-12 DIAGNOSIS — D62 Acute posthemorrhagic anemia: Secondary | ICD-10-CM

## 2015-04-12 DIAGNOSIS — I10 Essential (primary) hypertension: Secondary | ICD-10-CM

## 2015-04-12 DIAGNOSIS — Z720 Tobacco use: Secondary | ICD-10-CM | POA: Diagnosis not present

## 2015-04-12 DIAGNOSIS — S72012D Unspecified intracapsular fracture of left femur, subsequent encounter for closed fracture with routine healing: Secondary | ICD-10-CM | POA: Diagnosis not present

## 2015-04-12 DIAGNOSIS — E876 Hypokalemia: Secondary | ICD-10-CM | POA: Diagnosis not present

## 2015-04-12 DIAGNOSIS — K259 Gastric ulcer, unspecified as acute or chronic, without hemorrhage or perforation: Secondary | ICD-10-CM | POA: Diagnosis not present

## 2015-04-12 DIAGNOSIS — K257 Chronic gastric ulcer without hemorrhage or perforation: Secondary | ICD-10-CM | POA: Diagnosis not present

## 2015-04-12 DIAGNOSIS — M6281 Muscle weakness (generalized): Secondary | ICD-10-CM | POA: Diagnosis not present

## 2015-04-12 DIAGNOSIS — K439 Ventral hernia without obstruction or gangrene: Secondary | ICD-10-CM | POA: Diagnosis not present

## 2015-04-12 DIAGNOSIS — D649 Anemia, unspecified: Secondary | ICD-10-CM | POA: Diagnosis not present

## 2015-04-12 DIAGNOSIS — Z4789 Encounter for other orthopedic aftercare: Secondary | ICD-10-CM | POA: Diagnosis not present

## 2015-04-12 LAB — CBC
HEMATOCRIT: 28.2 % — AB (ref 36.0–46.0)
HEMOGLOBIN: 9.4 g/dL — AB (ref 12.0–15.0)
MCH: 32.1 pg (ref 26.0–34.0)
MCHC: 33.3 g/dL (ref 30.0–36.0)
MCV: 96.2 fL (ref 78.0–100.0)
Platelets: 161 10*3/uL (ref 150–400)
RBC: 2.93 MIL/uL — ABNORMAL LOW (ref 3.87–5.11)
RDW: 13.1 % (ref 11.5–15.5)
WBC: 8.6 10*3/uL (ref 4.0–10.5)

## 2015-04-12 MED ORDER — POLYSACCHARIDE IRON COMPLEX 150 MG PO CAPS: 150.0000 mg | ORAL_CAPSULE | Freq: Every day | ORAL | Status: DC

## 2015-04-12 MED ORDER — METHOCARBAMOL 500 MG PO TABS: 500.0000 mg | ORAL_TABLET | Freq: Three times a day (TID) | ORAL | Status: DC | PRN

## 2015-04-12 MED ORDER — ACETAMINOPHEN 500 MG PO TABS: 500.0000 mg | ORAL_TABLET | Freq: Four times a day (QID) | ORAL | Status: DC | PRN

## 2015-04-12 MED ORDER — ENOXAPARIN SODIUM 40 MG/0.4ML ~~LOC~~ SOLN: 40.0000 mg | SUBCUTANEOUS | Status: DC

## 2015-04-12 MED ORDER — HYDROCODONE-ACETAMINOPHEN 5-325 MG PO TABS: 1.0000 | ORAL_TABLET | Freq: Four times a day (QID) | ORAL | Status: DC | PRN

## 2015-04-12 NOTE — Progress Notes (Signed)
Patient discharging to [facility].  IV removed - WNL.  Report called and given to [facility] [place].  Will transport patient in bed around 1300 per facility request.  Patient stable at this time, no complaints, aware of DC plan.

## 2015-04-12 NOTE — Discharge Summary (Signed)
Physician Discharge Summary  [name] EXH:[id number] DOB: 05-06-1946 DOA: 04/08/2015  PCP: Glo Herring., MD  Admit date: 04/08/2015 Discharge date: 04/12/2015  Recommendations for Outpatient Follow-up:  1. Per Dr. Aline Brochure, staples are to be removed on 11/7. Follow up with him in 4 weeks. Continue DVT prophylaxis for total 28 days.  2. Consider repeat CBC 1 week   Follow-up Information    Follow up with Glo Herring., MD.   Specialty:  Internal Medicine   Contact information:   [address] [place] [address] [phone]       Follow up with Arther Abbott, MD. Schedule an appointment as soon as possible for a visit in 4 weeks.   Specialties:  Orthopedic Surgery, Radiology   Contact information:   [address] [place] [address] [phone]        Discharge Diagnoses:  1. Acute left hip fracture. 2. Essential HTN. 3. Acute blood loss anemia. 4. PMH Crohn's disease. 5. Tobacco abuse.  Discharge Condition: Improved Disposition: SNF  Diet recommendation: Regular  Filed Weights   04/08/15 2330 04/09/15 0647 04/09/15 1335  Weight: 57.153 kg (126 lb) 59.739 kg (131 lb 11.2 oz) 59.421 kg (131 lb)    History of present illness:  69 yow PMH Crohn's disease, HTN, prior CVA, and DDD presented s/p a mechanical fall that occurred. Patient complained of pain in her left hip and knee. While in the ED, CT head and cervical spine were negative. Xray of the left hip revealed a subcapital fracture of the femur.   Hospital Course:  Dr. Aline Brochure performed a partial left hip replacement on 10/24 to repair the patient's acute left hip fractur. PT and OT consulted and recommended SNF upon discharge. Hypokalemia was repleted. Acute blood loss anemia, likely associated with surgery and recent fracture remained stable. Low grade temp likely atelectasis. No evidence of infection.   Individual issues as below:  1. Acute left hip fracture.  Partial left hip replacement performed 10/24 by Dr. Aline Brochure. PT/OT followed, recommended SNF upon discharge.  2. Hypokalemia, repleted.  3. Essential HTN, stable.  4. Acute blood loss anemia, likely associated with surgery and recent fx. Hgb stable. No evidence of bleeding. 5. Hx of gastric erosions and ulcerations, continue PPI.  6. Hx of Crohn's disease. Stable. Continue Delzicol. 7. Tobacco abuse. Counseled on the importance of cessation--not interested in quitting  Consultants:  Orthopedic surgery  PT- SNF  OT- SNF  Procedures:  Partial left hip replacement 10/24.  Antibiotics:  none  Discharge Instructions  Current Discharge Medication List    START taking these medications   Details  enoxaparin (LOVENOX) 40 MG/0.4ML injection Inject 0.4 mLs (40 mg total) into the skin daily. Last dose 11/20. Qty: 0 Syringe    HYDROcodone-acetaminophen (NORCO/VICODIN) 5-325 MG tablet Take 1-2 tablets by mouth every 6 (six) hours as needed for moderate pain or severe pain. Qty: 30 tablet, Refills: 0    iron polysaccharides (NIFEREX) 150 MG capsule Take 1 capsule (150 mg total) by mouth daily.    methocarbamol (ROBAXIN) 500 MG tablet Take 1 tablet (500 mg total) by mouth every 8 (eight) hours as needed for muscle spasms. Qty: 20 tablet, Refills: 0      CONTINUE these medications which have CHANGED   Details  acetaminophen (TYLENOL) 500 MG tablet Take 1 tablet (500 mg total) by mouth every 6 (six) hours as needed for mild pain.      CONTINUE these medications which have NOT CHANGED  Details  aspirin EC 81 MG tablet Take 1 tablet (81 mg total) by mouth daily. Resume in 1 week    DELZICOL 400 MG CPDR DR capsule TAKE 2 CAPSULES BY MOUTH TWICE DAILY. Qty: 120 capsule, Refills: 4    omeprazole (PRILOSEC) 20 MG capsule Take 1 capsule (20 mg total) by mouth 2 (two) times daily before a meal. Qty: 60 capsule, Refills: 3    ondansetron (ZOFRAN) 4 MG tablet Take 4 mg by mouth  every 8 (eight) hours as needed for nausea or vomiting.     polyethylene glycol (MIRALAX) packet Take 17 g by mouth daily as needed for mild constipation. Qty: 14 each, Refills: 0    sucralfate (CARAFATE) 1 GM/10ML suspension Take 10 mLs (1 g total) by mouth 4 (four) times daily -  with meals and at bedtime. Qty: 420 mL, Refills: 0      STOP taking these medications     diphenhydrAMINE (BENADRYL) 25 mg capsule      lisinopril (PRINIVIL,ZESTRIL) 20 MG tablet        Allergies  Allergen Reactions  . Flagyl [Metronidazole Hcl] Other (See Comments)    "makes me feel whoosy and weird"  . Other     Unknown antibiotic and unknown pain medications     The results of significant diagnostics from this hospitalization (including imaging, microbiology, ancillary and laboratory) are listed below for reference.    Significant Diagnostic Studies: Dg Chest 1 View  04/09/2015  CLINICAL DATA:  69 year old female with generalized hip pain and lateral knee pain. EXAM: CHEST 1 VIEW COMPARISON:  Chest x-ray 02/21/2015. FINDINGS: Lung volumes are normal. No consolidative airspace disease. No pleural effusions. No pneumothorax. No pulmonary nodule or mass noted. Pulmonary vasculature and the cardiomediastinal silhouette are within normal limits. IMPRESSION: No radiographic evidence of acute cardiopulmonary disease. Electronically Signed   By: Vinnie Langton M.D.   On: 04/09/2015 02:13   Ct Head Wo Contrast  04/09/2015  CLINICAL DATA:  Status post fall, landing on left side. Concern for head or cervical spine injury. Initial encounter. EXAM: CT HEAD WITHOUT CONTRAST CT CERVICAL SPINE WITHOUT CONTRAST TECHNIQUE: Multidetector CT imaging of the head and cervical spine was performed following the standard protocol without intravenous contrast. Multiplanar CT image reconstructions of the cervical spine were also generated. COMPARISON:  CT of the head performed 01/21/2002 FINDINGS: CT HEAD FINDINGS There is no  evidence of acute infarction, mass lesion, or intra- or extra-axial hemorrhage on CT. Prominence of ventricles and sulci reflects moderate cortical volume loss. Cerebellar atrophy is noted. Mild periventricular and subcortical white matter change likely reflects small vessel ischemic microangiopathy. A small chronic infarct is noted at the left posterior parietal lobe. The brainstem and fourth ventricle are within normal limits. The basal ganglia are unremarkable in appearance. No mass effect or midline shift is seen. There is no evidence of fracture; visualized osseous structures are unremarkable in appearance. There is incomplete fusion of the posterior arch of C1. The orbits are within normal limits. The paranasal sinuses and mastoid air cells are well-aerated. No significant soft tissue abnormalities are seen. CT CERVICAL SPINE FINDINGS There is no evidence of fracture or subluxation. Vertebral bodies demonstrate normal height and alignment. There is multilevel disc space narrowing along the lower cervical spine, with scattered anterior and posterior disc osteophyte complexes. Prevertebral soft tissues are within normal limits. The thyroid gland is unremarkable in appearance. The visualized lung apices are clear. Scattered calcification is noted at the carotid bifurcations bilaterally,  with likely moderate luminal narrowing on the right side. IMPRESSION: 1. No evidence of traumatic intracranial injury or fracture. 2. No evidence of fracture or subluxation along the cervical spine. 3. Moderate cortical volume loss and scattered small vessel ischemic microangiopathy. 4. Small chronic infarct at the left posterior parietal lobe. 5. Mild degenerative change noted along the lower cervical spine. 6. Scattered calcification at the carotid bifurcations bilaterally, with likely moderate luminal narrowing on the right side. Carotid ultrasound would be helpful for further evaluation, when and as deemed clinically  appropriate. Electronically Signed   By: Garald Balding M.D.   On: 04/09/2015 03:27   Dg Pelvis Portable  04/09/2015  CLINICAL DATA:  Postop left hip surgery hip replacement today EXAM: PORTABLE PELVIS 1-2 VIEWS COMPARISON:  None. FINDINGS: Anticipated postoperative change over the soft tissues surrounding the left hip. On single AP view components of left hip replacement appear to be in anticipated position. IMPRESSION: Anticipated postoperative change Electronically Signed   By: Skipper Cliche M.D.   On: 04/09/2015 17:23   Dg Knee Complete 4 Views Left  04/09/2015  CLINICAL DATA:  Status post fall onto left side. Left lateral knee pain. Initial encounter. EXAM: LEFT KNEE - COMPLETE 4+ VIEW COMPARISON:  None. FINDINGS: There is no evidence of fracture or dislocation. The joint spaces are preserved. No significant degenerative change is seen; the patellofemoral joint is grossly unremarkable in appearance. No significant joint effusion is seen. Mild scattered vascular calcifications are seen. IMPRESSION: No evidence of fracture or dislocation. Electronically Signed   By: Garald Balding M.D.   On: 04/09/2015 02:12   Dg Hip Unilat W Or W/o Pelvis 2-3 Views Left  04/09/2015  CLINICAL DATA:  Status post fall onto left side, with generalized left hip pain. Initial encounter. EXAM: DG HIP (WITH OR WITHOUT PELVIS) 2-3V LEFT COMPARISON:  None. FINDINGS: There is a displaced subcapital fracture of the left femoral neck, with mild comminution. The left femoral head remains seated at the acetabulum. The right hip joint is grossly unremarkable in appearance. Mild degenerative change is noted at the sacroiliac joints. The visualized bowel gas pattern is grossly unremarkable in appearance. IMPRESSION: Displaced subcapital fracture of the left femoral neck, with mild comminution. Electronically Signed   By: Garald Balding M.D.   On: 04/09/2015 02:11    Microbiology: Recent Results (from the past 240 hour(s))    Blood culture (routine single)     Status: None (Preliminary result)   Collection Time: 04/09/15 10:00 AM  Result Value Ref Range Status   Specimen Description BLOOD LEFT HAND  Final   Special Requests BOTTLES DRAWN AEROBIC AND ANAEROBIC 10CC EACH  Final   Culture NO GROWTH 2 DAYS  Final   Report Status PENDING  Incomplete     Labs: Basic Metabolic Panel:  Recent Labs Lab 04/09/15 0056 04/09/15 0941 04/11/15 0711  NA 140  --  133*  K 3.3*  --  4.0  CL 106  --  103  CO2 26  --  26  GLUCOSE 136*  --  84  BUN 9  --  7  CREATININE 0.77  --  0.65  CALCIUM 8.9  --  8.0*  MG  --  1.8  --    CBC:  Recent Labs Lab 04/09/15 0056 04/10/15 0551 04/11/15 0711 04/12/15 0603  WBC 13.2* 9.2 10.2 8.6  NEUTROABS 10.8*  --   --   --   HGB 12.2 10.6* 9.9* 9.4*  HCT 36.4 32.2* 29.6*  28.2*  MCV 94.8 95.5 95.5 96.2  PLT 221 165 147* 161    Principal Problem:   Left displaced femoral neck fracture (HCC) Active Problems:   Crohn's disease (HCC)   Tobacco abuse   HTN (hypertension)   Subcapital fracture of hip (HCC)   Hip fracture (HCC)   Postoperative anemia due to acute blood loss   Time coordinating discharge: 30 minutes  Signed:  Murray Hodgkins, MD [facility] 04/12/2015, 7:59 AM  By signing my name below, I, Rosalie Doctor attest that this documentation has been prepared under the direction and in the presence of Murray Hodgkins, MD Electronically signed: Rosalie Doctor, Scribe.  04/12/2015  I personally performed the services described in this documentation. All medical record entries made by the scribe were at my direction. I have reviewed the chart and agree that the record reflects my personal performance and is accurate and complete. Murray Hodgkins, MD

## 2015-04-12 NOTE — Progress Notes (Signed)
PROGRESS NOTE  [name] GYK:[id number] DOB: 1946-03-04 DOA: 04/08/2015 PCP: Glo Herring., MD  Summary: 51 yof with a hx of chrohn's disease, HTN, prior CVA, and DDD presented s/p a mechanical fall that occurred. Patient complained of pain in her left hip and knee. While in the ED, CT head and cervical spine were negative. Xray of the left hip revealed a subcapital fracture of the femur. She was admitted for further management and had a partial left hip replacement performed on 10/24 by Dr. Aline Brochure.    Assessment/Plan: 1. Acute left hip fracture. Partial left hip replacement performed 10/24 by Dr. Aline Brochure.  PT/OT followed, recommended SNF upon discharge.  2. Hypokalemia, repleted.  3. Essential HTN, stable.   4. Acute blood loss anemia, likely associated with surgery and recent fx. Hgb stable. No evidence of bleeding. 5. Hx of gastric erosions and ulcerations, continue PPI.  6. Hx of Crohn's disease. Stable. Continue Delzicol. 7. Tobacco abuse. Counseled on the importance of cessation--not interested in quitting   Overall improved. Discharge to SNF today.  Per Dr. Aline Brochure, [name]. STAPLES OUT 11.7.16. F/U DR HARRISON IN 4 WEEKS. DVT PROPHYLAXIS X 28 DAYS.  DIRECT LATERAL HIP APPROACH PRECAUTIONS   Code Status: Full DVT prophylaxis: Lovenox Family Communication: Daughter at bedside. Discussed with patient who understands and has no concerns at this time. Disposition Plan: Discharge to SNF today.  Murray Hodgkins, MD  [facility]  Pager [phone] If 7PM-7AM, please contact night-coverage at [email or web address], password [email or web address] 04/12/2015, 7:26 AM  LOS: 3 days   Consultants:  Orthopedic surgery  PT- SNF  OT- SNF  Procedures:  Partial left hip replacement 10/24.   Antibiotics:    HPI/Subjective: Feels okay but her hip is sore. Has an appetite and ate breakfast. Denies any nausea or vomiting.   Objective: Filed Vitals:   04/11/15 0604 04/11/15 2009 04/11/15  2244 04/12/15 0541  BP: 128/72 140/70  90/53  Pulse: 95 113  87  Temp: 98.9 F (37.2 C) 100.8 F (38.2 C) 98.9 F (37.2 C) 98.8 F (37.1 C)  TempSrc: Oral Oral  Oral  Resp: 18 20  20   Height:      Weight:      SpO2: 96% 94%  97%    Intake/Output Summary (Last 24 hours) at 04/12/15 0726 Last data filed at 04/12/15 0616  Gross per 24 hour  Intake   2125 ml  Output    400 ml  Net   1725 ml     Filed Weights   04/08/15 2330 04/09/15 0647 04/09/15 1335  Weight: 57.153 kg (126 lb) 59.739 kg (131 lb 11.2 oz) 59.421 kg (131 lb)    Exam:    VSS, afebrile General:  Appears calm and comfortable Eyes: PERRL, normal lids, irises & conjunctiva ENT: grossly normal hearing, lips & tongue Cardiovascular: RRR, Soft 2/6 systolic mumur, No /r/g. No LE edema. Respiratory: CTA bilaterally, no w/r/r. Normal respiratory effort. Musculoskeletal: grossly normal tone BLE Psychiatric: grossly normal mood and affect, speech fluent and appropriate  New data reviewed:  Hgb 9.4,  Platelets 161   Pertinent data since admission:  Left hip xray IMPRESSION: Displaced subcapital fracture of the left femoral neck, with mild comminution.   Pending data:  BC  Scheduled Meds: . docusate sodium  100 mg Oral BID  . enoxaparin (LOVENOX) injection  40 mg Subcutaneous Q24H  . iron polysaccharides  150 mg Oral Daily  . lisinopril  10 mg Oral Daily  . Mesalamine  400 mg  Oral BID  . nicotine  14 mg Transdermal Daily  . pantoprazole  40 mg Oral Daily  . polyethylene glycol  17 g Oral Daily  . sucralfate  1 g Oral TID WC & HS  . traMADol  50 mg Oral 4 times per day   Continuous Infusions:    Principal Problem:   Left displaced femoral neck fracture (HCC) Active Problems:   Crohn's disease (HCC)   Tobacco abuse   HTN (hypertension)   Subcapital fracture of hip (HCC)   Hip fracture (HCC)   Postoperative anemia due to acute blood loss   By signing my name below, I, Rosalie Doctor  attest that this documentation has been prepared under the direction and in the presence of Murray Hodgkins, MD Electronically signed: Rosalie Doctor, Scribe.  04/12/2015 10:34am  I personally performed the services described in this documentation. All medical record entries made by the scribe were at my direction. I have reviewed the chart and agree that the record reflects my personal performance and is accurate and complete. Murray Hodgkins, MD

## 2015-04-12 NOTE — Care Management Important Message (Signed)
Important Message  Patient Details  Name: [name] MRN: [id number] Date of Birth: 1946/05/11   Medicare Important Message Given:  Yes-second notification given    Sherald Barge, RN 04/12/2015, 11:29 AM

## 2015-04-12 NOTE — Progress Notes (Signed)
WBAT STAPLES OUT 11.7.16 F/U DR Salathiel Ferrara IN 4  WEEKS  DVT PROPHYLAXIS X 28 DAYS  DIRECT LATERAL HIP APPROACH PRECAUTIONS

## 2015-04-12 NOTE — Progress Notes (Signed)
miralax and docusate sodium held d/t patients stools getting looser.  Patient does not feel that they are needed at this time

## 2015-04-12 NOTE — Plan of Care (Signed)
Problem: Phase III Progression Outcomes Goal: Ambulates Outcome: Adequate for Discharge Discharging to SNF for rehab/PT

## 2015-04-12 NOTE — Progress Notes (Signed)
Patient ID: [name], female   DOB: February 07, 1946, 69 y.o.   MRN: [id number]   This is an acute visit.  Level care skilled.  Facility [facility] nursing.  -Acute visit status post hospitalization for acute left hip fracture status post repair.  History of present illness.  Patient is a pleasant 69 year old female with a history of hypertension prior CVA and degenerative disc disease-sustained a mechanical fall-she had hip and knee pain on the left-x-ray showed a subcapital fracture of the femur.  She received a partial left hip replacement.  PT and OT was consulted and recommended skilled nursing for strengthening.  She did have hypokalemia this was repleted.  She also had acute blood loss anemia which apparently was stable apparently did not require a transfusion.  She is here essentially for strengthening.  She does not have any acute complaints currently pain at this point appears to be controlled.  She is on Lovenox for anticoagulation DVT prophylaxis.  Previous medical history.  Acute left hip fracture status post repair.  Hypertension.  Acute blood loss anemia.  GERD with history of gastric erosions and ulcerations.  Crohn's disease.  Hypokalemia which is been repleted.  History of tobacco abuse she would like a nicotine patch  History of CVA.  History of ventral hernia.  History of sciatica right side.  Surgery.  History of cholecystectomy 2008.  2004r carotid endarterectomy  History a right heme a colectomy.  Tubal ligation.  Incisional hernia repair   Social history.  Patient is a current smoker-no history of smokeless tobacco use or significant alcohol or illicit drug use.  Family history  Mother-history of MI.  Significant for history of GI issues father.  Brother with history of CVA.  Sister with history of hypotension.  Medications.  Lovenox 40 mg daily.  Vicodin 10/17/2023 milligrams every 6 hours when necessary.  Iron 150 mg  daily.  Robaxin 500 mg every 8 hours when necessary.  Tylenol 500 mg every 6 hours when necessary mild pain.  Aspirin 81 mg daily resume this in one week.  Delzicol 400 mg-2 capsules twice a day.  Prilosec 20 mg daily.  Zofran 4 mg every 8 hours when necessary.  MiraLAX daily when necessary.  Carafate 1 gm QD at HS   Review of systems.  In general does not complain of any fever or chills.  Skin does not complain of rashes or itching surgical site appears stable left leg.  Head ears eyes nose mouth and throat does not complain of any sore throat visual changes or nasal discharge.  Respiratory no shortness breath or cough.  Cardiac does not complain of chest pain or significant lower extremity edema.  Does not complain of abdominal pain nausea or vomiting does have a significant history of GERD.  GU does not complain of dysuria.  Muscle skeletal at this point hip pain joint pain appears controlled she is receiving Vicodin as well as a muscle relaxer as needed.  Neurologic is not complaining of dizziness headache or numbness.  Psych is not complaining of depression or anxiety.  Physical exam.  Temperature is 97.9 pulse 90 respirations 16 blood pressure 122/59 O2 saturation is 92%.  In general this is a pleasant elderly female in no distress.  Her skin is warm and dry she has what appears to be a lipoma over her mid back that is nontender.  She also is a well-healed abdominal scar.  Oropharynx is clear mucous membranes moist.  She is edentulous.  Eyes pupils  appear reactive light visual acuity appears intact.  Chest is clear to auscultation there is no labored breathing.  Heart is regular rate and rhythm with a 2/6 systolic murmur she does not have significant lower extremity edema pedal pulses are intact.  Abdomen is soft nontender with positive bowel sounds.  Musculoskeletal is able to move all extremities 4 limited left leg secondary to recent surgery  incision site Staples are in place there is no sign of infection.  Neurologic--as grossly intact no lateralizing findings her speech is clear.  Psych she is alert and oriented pleasant and appropriate.  Labs.  04/12/2015.  WBC 8.6 hemoglobin 9.4 platelets 161.  Sodium 133 potassium 4.0 BUN 7 creatinine 0.65.  04/09/2015.  [place] M1.8.  02/21/2015.  Liver function tests within normal limits except ALT of 13.  Assessment and plan.  #1 history of left hip fracture with repair-clinically she appears to be stable-she did have postop anemia which apparently has stabilized will update a CBC tomorrow-for pain she is on Vicodin as needed as well as Robaxin this appears to be stable.  She is on iron.  She is on Lovenox for anticoagulation.  #2 history of acute blood loss anemia-as noted she is on iron hemoglobin apparently stabilized we will update this.  #3 history of significant GERD with gastric erosions and ulcerations-she is on a PPI Prilosec. As  ewell as Carafateas   #4 history of Crohn's disease-she continues on Delzicol  #5 hypertension-she had been on Zestril this has been held at this point blood pressure appears stable most recently 122/59 at this point will monitor.  #7-history of hypokalemia this is been replenished-potassium 4.0 on lab done on October 27-will update this  #8 history of tobacco abuse will start a nicotine patch per patient request   [id number]-of note greater than 35 minutes spent assessing patient-reviewing her chart-and coordinating formulating a plan of care for numerous diagnoses-of note greater than 50% of time spent coordinating plan of care       .

## 2015-04-12 NOTE — Care Management Note (Signed)
Case Management Note  Patient Details  Name: [name] MRN: [id number] Date of Birth: 1946-01-06  Subjective/Objective:                    Action/Plan: Pt discharging to SNF today. CSW has arranged for placement. Pt/family aware and agreeable to DC plan. No CM needs.   Expected Discharge Date:                  Expected Discharge Plan:  Skilled Nursing Facility  In-House Referral:  Clinical Social Work  Discharge planning Services  CM Consult  Post Acute Care Choice:  NA Choice offered to:  NA  DME Arranged:    DME Agency:     HH Arranged:    [place] Agency:     Status of Service:  In process, will continue to follow  Medicare Important Message Given:  Yes-second notification given Date Medicare IM Given:    Medicare IM give by:    Date Additional Medicare IM Given:    Additional Medicare Important Message give by:     If discussed at [place] of Stay Meetings, dates discussed:    Additional Comments:  Sherald Barge, RN 04/12/2015, 11:29 AM

## 2015-04-12 NOTE — Clinical Social Work Placement (Signed)
   CLINICAL SOCIAL WORK PLACEMENT  NOTE  Date:  04/12/2015  Patient Details  Name: [name] MRN: [id number] Date of Birth: April 09, 1946  Clinical Social Work is seeking post-discharge placement for this patient at the [name] level of care (*CSW will initial, date and re-position this form in  chart as items are completed):  Yes   Patient/family provided with [name] Work Department's list of facilities offering this level of care within the geographic area requested by the patient (or if unable, by the patient's family).  Yes   Patient/family informed of their freedom to choose among providers that offer the needed level of care, that participate in Medicare, Medicaid or managed care program needed by the patient, have an available bed and are willing to accept the patient.  Yes   Patient/family informed of [facility]'s ownership interest in [facility] and [facility], as well as of the fact that they are under no obligation to receive care at these facilities.  PASRR submitted to EDS on 04/09/15     PASRR number received on 04/09/15     Existing PASRR number confirmed on       FL2 transmitted to all facilities in geographic area requested by pt/family on 04/10/15     FL2 transmitted to all facilities within larger geographic area on       Patient informed that his/her managed care company has contracts with or will negotiate with certain facilities, including the following:        Yes   Patient/family informed of bed offers received.  Patient chooses bed at [name]     Physician recommends and patient chooses bed at      Patient to be transferred to [name] on 04/12/15.  Patient to be transferred to facility by staff     Patient family notified on 04/12/15 of transfer.  Name of family member notified:  [name]     PHYSICIAN       Additional Comment:     _______________________________________________ [name], [place] 04/12/2015, 11:37 AM [phone]

## 2015-04-13 ENCOUNTER — Encounter (HOSPITAL_COMMUNITY)
Admission: AD | Admit: 2015-04-13 | Discharge: 2015-04-13 | Disposition: A | Discharge status: Home / Self Care | Payer: Medicare Other | Source: Skilled Nursing Facility | Attending: Internal Medicine | Admitting: Internal Medicine

## 2015-04-13 LAB — CBC WITH DIFFERENTIAL/PLATELET
BASOS PCT: 0 %
Basophils Absolute: 0 10*3/uL (ref 0.0–0.1)
EOS ABS: 0.2 10*3/uL (ref 0.0–0.7)
Eosinophils Relative: 2 %
HCT: 31.2 % — ABNORMAL LOW (ref 36.0–46.0)
Hemoglobin: 10.2 g/dL — ABNORMAL LOW (ref 12.0–15.0)
Lymphocytes Relative: 15 %
Lymphs Abs: 1.3 10*3/uL (ref 0.7–4.0)
MCH: 31.5 pg (ref 26.0–34.0)
MCHC: 32.7 g/dL (ref 30.0–36.0)
MCV: 96.3 fL (ref 78.0–100.0)
MONO ABS: 0.6 10*3/uL (ref 0.1–1.0)
MONOS PCT: 8 %
Neutro Abs: 6.2 10*3/uL (ref 1.7–7.7)
Neutrophils Relative %: 75 %
Platelets: 213 10*3/uL (ref 150–400)
RBC: 3.24 MIL/uL — ABNORMAL LOW (ref 3.87–5.11)
RDW: 12.9 % (ref 11.5–15.5)
WBC: 8.3 10*3/uL (ref 4.0–10.5)

## 2015-04-13 LAB — BASIC METABOLIC PANEL
Anion gap: 8 (ref 5–15)
BUN: 8 mg/dL (ref 6–20)
CO2: 28 mmol/L (ref 22–32)
CREATININE: 0.65 mg/dL (ref 0.44–1.00)
Calcium: 8.4 mg/dL — ABNORMAL LOW (ref 8.9–10.3)
Chloride: 100 mmol/L — ABNORMAL LOW (ref 101–111)
GFR calc Af Amer: >60 mL/min (ref >60)
GFR calc non Af Amer: >60 mL/min (ref >60)
GLUCOSE: 95 mg/dL (ref 65–99)
Potassium: 4.1 mmol/L (ref 3.5–5.1)
Sodium: 136 mmol/L (ref 135–145)

## 2015-04-13 LAB — TYPE AND SCREEN
ABO/RH(D): O POS
Antibody Screen: NEGATIVE
UNIT DIVISION: 0
Unit division: 0

## 2015-04-14 LAB — CULTURE, BLOOD (SINGLE): Culture: NO GROWTH

## 2015-04-16 ENCOUNTER — Non-Acute Institutional Stay (SKILLED_NURSING_FACILITY): Payer: Medicare Other | Admitting: Internal Medicine

## 2015-04-16 DIAGNOSIS — K257 Chronic gastric ulcer without hemorrhage or perforation: Secondary | ICD-10-CM

## 2015-04-16 DIAGNOSIS — S72002D Fracture of unspecified part of neck of left femur, subsequent encounter for closed fracture with routine healing: Secondary | ICD-10-CM

## 2015-04-16 DIAGNOSIS — E876 Hypokalemia: Secondary | ICD-10-CM | POA: Diagnosis not present

## 2015-04-16 DIAGNOSIS — K5000 Crohn's disease of small intestine without complications: Secondary | ICD-10-CM

## 2015-04-16 NOTE — Progress Notes (Signed)
Patient ID: [name], female   DOB: Mar 21, 1946, 69 y.o.   MRN: [id number]    Facility; [facility] Chief complaint; admission to SNF post admit to [facility] from 10/23 to 10/27  History; this is a patient who suffered a mechanical fall at home. She does not have a history of falling. She suffered a left subcapital hip fracture and underwent a partial left hip replacement. She apparently tolerated this well. Postoperatively he developed problems with hypokalemia but this was replaced. She is here for rehabilitation. The patient is a smoker, I suspect this is going to be an issue in a nonsmoking facility. We have already given her nicotine replacement.  The patient's most relevant in the recent past medical history included the hospitalization from 9/7 through 9/11 2016. She presented with a small bowel obstruction I think secondary to adhesions. She has a history of Crohn's disease of the small bowel. She developed blood up through her NG tube at the time. Endoscopy showed multiple ulcerations and erosions. Pathology negative for H. pylori or ischemia. She was put on proton pump inhibitors.  Past Medical History  Diagnosis Date  . Crohn's disease ([place])   . Hypertension   . Stroke ([place])   . Arthritis   . Sciatica of right side   . Degenerative disc disease, lumbar   . Ventral hernia    . Past Surgical History  Procedure Laterality Date  . Cholecystectomy      2008  . Carotid endarterectomy      2004  . Hemicolectomy      right  . Tubal ligation    . Incisional hernia repair N/A 05/20/2013    Procedure: [name] HERNIORRHAPHY WITH MESH;  Surgeon: Jamesetta So, MD;  Location: [facility];  Service: General;  Laterality: N/A;  . Insertion of mesh N/A 05/20/2013    Procedure: INSERTION OF MESH;  Surgeon: Jamesetta So, MD;  Location: [facility];  Service: General;  Laterality: N/A;  . Incisional hernia repair N/A 05/19/2014    Procedure: RECURRENT HERNIA REPAIR INCISIONAL;  Surgeon: Jamesetta So, MD;  Location: [facility];  Service: General;  Laterality: N/A;  . Insertion of mesh N/A 05/19/2014    Procedure: INSERTION OF MESH;  Surgeon: Jamesetta So, MD;  Location: [facility];  Service: General;  Laterality: N/A;  . Esophagogastroduodenoscopy N/A 02/24/2015    Procedure: ESOPHAGOGASTRODUODENOSCOPY (EGD);  Surgeon: Daneil Dolin, MD;  Location: [facility];  Service: Endoscopy;  Laterality: N/A;  . Hip arthroplasty Left 04/09/2015    Procedure: INSERT PARTIAL HIP REPLACEMENT LEFT;  Surgeon: Carole Civil, MD;  Location: [facility];  Service: Orthopedics;  Laterality: Left;   Current Outpatient Prescriptions on File Prior to Visit  Medication Sig Dispense Refill  . acetaminophen (TYLENOL) 500 MG tablet Take 1 tablet (500 mg total) by mouth every 6 (six) hours as needed for mild pain.    Marland Kitchen aspirin EC 81 MG tablet Take 1 tablet (81 mg total) by mouth daily. Resume in 1 week (Patient taking differently: Take 81 mg by mouth daily. )    . DELZICOL 400 MG CPDR DR capsule TAKE 2 CAPSULES BY MOUTH TWICE DAILY. 120 capsule 4  . enoxaparin (LOVENOX) 40 MG/0.4ML injection Inject 0.4 mLs (40 mg total) into the skin daily. Last dose 11/20. 0 Syringe   . HYDROcodone-acetaminophen (NORCO/VICODIN) 5-325 MG tablet Take 1-2 tablets by mouth every 6 (six) hours as needed for moderate pain or severe pain. 30 tablet  0  . iron polysaccharides (NIFEREX) 150 MG capsule Take 1 capsule (150 mg total) by mouth daily.    . methocarbamol (ROBAXIN) 500 MG tablet Take 1 tablet (500 mg total) by mouth every 8 (eight) hours as needed for muscle spasms. 20 tablet 0  . omeprazole (PRILOSEC) 20 MG capsule Take 1 capsule (20 mg total) by mouth 2 (two) times daily before a meal. 60 capsule 3  . ondansetron (ZOFRAN) 4 MG tablet Take 4 mg by mouth every 8 (eight) hours as needed for nausea or vomiting.     . polyethylene glycol (MIRALAX) packet Take 17 g by mouth daily as needed for mild constipation. 14 each 0  . sucralfate  (CARAFATE) 1 GM/10ML suspension Take 10 mLs (1 g total) by mouth 4 (four) times daily -  with meals and at bedtime. 420 mL 0    Socially; the patient lives in [place]. She does not use ambulatory assist device. She is a smoker. She does not relate a history of falling.  reports that she has been smoking.  She has never used smokeless tobacco. She reports that she does not drink alcohol or use illicit drugs.  indicated that her mother is deceased. She indicated that her father is deceased. She indicated that two of her three sisters are alive. She indicated that two of her three brothers are alive. She indicated that her child is alive. She indicated that her other is alive.   Review of systems Gen. patient states she is feeling well and anxious to engage in physical therapy Respiratory states she has a nonproductive cough Cardiac no chest pain GI; no abdominal pain no diarrhea no nausea no vomiting GU no dysuria Musculoskeletal; she does not state she has an ambulatory issue. I don't believe she knows her bone density status  Physical examination Gen. the patient looks well alert and responsive Vitals O2 sat is 95% on room air respirations 20 and unlabored pulse 96 HEENT no oral lesions are seen Neck no thyroid palpable. Lymph none palpable in the cervical clavicular or axillary areas. Cardiac heart sounds are normal no murmurs she appears to be euvolemic Respiratory; air entry is equal bilaterally no wheezing or crackles Abdomen she has appearing emboli: Surgical scar from a hernia repair she probably has a small decisional hernia at the superior aspect of this incision however this reduces easily. No liver no spleen no masses GU bladder not distended there is no tenderness Extremities; no evidence of a DVT Musculoskeletal left hip incision is clean and well opposed no infection. Neurologic; she is hyporeflexic in the lower extremities reflexes are absent at the knee jerks  bilaterally. She has some weakness in the right hip flexor and abductor 4 out of 5 bilaterally  Impression/plan #1 left hip fracture status post partial left hip arthroplasty. She appears to have tolerated this well. The patient claims there is some difficulty determining her ambulatory status, I don't see specific orders however patients who have this type of surgery are usually ambulatory #2 hypokalemia this is already been checked and stable #3 Crohn's disease of the small bowel this seems stable on mesalamine. #4 recent history in September 16 of an upper GI bleed secondary to multiple gastric ulcerations. H. pylori negative no history of NSAID use. Fortunately this appears to be stable. #5 high likelihood of osteoporosis #6 tobacco abuse.  The patient is already asking about going home. She lives with a daughter and a sister. We'll see how long she  will stay here.  BMP Latest Ref Rng 04/13/2015 04/11/2015 04/09/2015  Glucose 65 - 99 mg/dL 95 84 136(H)  BUN 6 - 20 mg/dL 8 7 9   Creatinine 0.44 - 1.00 mg/dL 0.65 0.65 0.77  Sodium 135 - 145 mmol/L 136 133(L) 140  Potassium 3.5 - 5.1 mmol/L 4.1 4.0 3.3(L)  Chloride 101 - 111 mmol/L 100(L) 103 106  CO2 22 - 32 mmol/L 28 26 26   Calcium 8.9 - 10.3 mg/dL 8.4(L) 8.0(L) 8.9   CBC Latest Ref Rng 04/13/2015 04/12/2015 04/11/2015  WBC 4.0 - 10.5 K/uL 8.3 8.6 10.2  Hemoglobin 12.0 - 15.0 g/dL 10.2(L) 9.4(L) 9.9(L)  Hematocrit 36.0 - 46.0 % 31.2(L) 28.2(L) 29.6(L)  Platelets 150 - 400 K/uL 213 161 147(L)

## 2015-04-17 DIAGNOSIS — K439 Ventral hernia without obstruction or gangrene: Secondary | ICD-10-CM | POA: Diagnosis not present

## 2015-04-17 DIAGNOSIS — D649 Anemia, unspecified: Secondary | ICD-10-CM | POA: Diagnosis not present

## 2015-04-17 DIAGNOSIS — S72002D Fracture of unspecified part of neck of left femur, subsequent encounter for closed fracture with routine healing: Secondary | ICD-10-CM | POA: Diagnosis not present

## 2015-04-17 DIAGNOSIS — K5090 Crohn's disease, unspecified, without complications: Secondary | ICD-10-CM | POA: Diagnosis not present

## 2015-04-17 DIAGNOSIS — S72012D Unspecified intracapsular fracture of left femur, subsequent encounter for closed fracture with routine healing: Secondary | ICD-10-CM | POA: Diagnosis not present

## 2015-04-17 DIAGNOSIS — K5000 Crohn's disease of small intestine without complications: Secondary | ICD-10-CM | POA: Diagnosis not present

## 2015-04-17 DIAGNOSIS — R278 Other lack of coordination: Secondary | ICD-10-CM | POA: Diagnosis not present

## 2015-04-17 DIAGNOSIS — M6281 Muscle weakness (generalized): Secondary | ICD-10-CM | POA: Diagnosis not present

## 2015-04-17 DIAGNOSIS — I10 Essential (primary) hypertension: Secondary | ICD-10-CM | POA: Diagnosis not present

## 2015-04-17 DIAGNOSIS — R262 Difficulty in walking, not elsewhere classified: Secondary | ICD-10-CM | POA: Diagnosis not present

## 2015-04-17 DIAGNOSIS — Z720 Tobacco use: Secondary | ICD-10-CM | POA: Diagnosis not present

## 2015-04-17 DIAGNOSIS — Z4789 Encounter for other orthopedic aftercare: Secondary | ICD-10-CM | POA: Diagnosis not present

## 2015-04-17 DIAGNOSIS — E876 Hypokalemia: Secondary | ICD-10-CM | POA: Diagnosis not present

## 2015-04-17 DIAGNOSIS — K259 Gastric ulcer, unspecified as acute or chronic, without hemorrhage or perforation: Secondary | ICD-10-CM | POA: Diagnosis not present

## 2015-04-18 ENCOUNTER — Other Ambulatory Visit: Payer: Self-pay | Admitting: *Deleted

## 2015-04-18 MED ORDER — HYDROCODONE-ACETAMINOPHEN 5-325 MG PO TABS: ORAL_TABLET | ORAL | Status: DC

## 2015-04-18 NOTE — Telephone Encounter (Signed)
[facility]

## 2015-04-23 ENCOUNTER — Non-Acute Institutional Stay (SKILLED_NURSING_FACILITY): Payer: Medicare Other | Admitting: Internal Medicine

## 2015-04-23 ENCOUNTER — Encounter (HOSPITAL_COMMUNITY)
Admission: RE | Admit: 2015-04-23 | Discharge: 2015-04-23 | Disposition: A | Discharge status: Home / Self Care | Payer: Medicare Other | Source: Skilled Nursing Facility | Attending: Internal Medicine | Admitting: Internal Medicine

## 2015-04-23 DIAGNOSIS — E876 Hypokalemia: Secondary | ICD-10-CM | POA: Diagnosis not present

## 2015-04-23 DIAGNOSIS — K5000 Crohn's disease of small intestine without complications: Secondary | ICD-10-CM

## 2015-04-23 DIAGNOSIS — S72002D Fracture of unspecified part of neck of left femur, subsequent encounter for closed fracture with routine healing: Secondary | ICD-10-CM

## 2015-04-23 LAB — BASIC METABOLIC PANEL
ANION GAP: 9 (ref 5–15)
BUN: 7 mg/dL (ref 6–20)
CHLORIDE: 102 mmol/L (ref 101–111)
CO2: 29 mmol/L (ref 22–32)
Calcium: 9.1 mg/dL (ref 8.9–10.3)
Creatinine, Ser: 0.73 mg/dL (ref 0.44–1.00)
GFR calc Af Amer: >60 mL/min (ref >60)
GFR calc non Af Amer: >60 mL/min (ref >60)
GLUCOSE: 103 mg/dL — AB (ref 65–99)
POTASSIUM: 3.2 mmol/L — AB (ref 3.5–5.1)
Sodium: 140 mmol/L (ref 135–145)

## 2015-04-23 LAB — CBC
HEMATOCRIT: 34.6 % — AB (ref 36.0–46.0)
HEMOGLOBIN: 11.2 g/dL — AB (ref 12.0–15.0)
MCH: 31 pg (ref 26.0–34.0)
MCHC: 32.4 g/dL (ref 30.0–36.0)
MCV: 95.8 fL (ref 78.0–100.0)
Platelets: 479 10*3/uL — ABNORMAL HIGH (ref 150–400)
RBC: 3.61 MIL/uL — ABNORMAL LOW (ref 3.87–5.11)
RDW: 12.7 % (ref 11.5–15.5)
WBC: 9.9 10*3/uL (ref 4.0–10.5)

## 2015-04-25 DIAGNOSIS — I10 Essential (primary) hypertension: Secondary | ICD-10-CM | POA: Diagnosis not present

## 2015-04-25 DIAGNOSIS — Z9181 History of falling: Secondary | ICD-10-CM | POA: Diagnosis not present

## 2015-04-25 DIAGNOSIS — K259 Gastric ulcer, unspecified as acute or chronic, without hemorrhage or perforation: Secondary | ICD-10-CM | POA: Diagnosis not present

## 2015-04-25 DIAGNOSIS — S72002D Fracture of unspecified part of neck of left femur, subsequent encounter for closed fracture with routine healing: Secondary | ICD-10-CM | POA: Diagnosis not present

## 2015-04-25 DIAGNOSIS — K5090 Crohn's disease, unspecified, without complications: Secondary | ICD-10-CM | POA: Diagnosis not present

## 2015-04-25 DIAGNOSIS — K439 Ventral hernia without obstruction or gangrene: Secondary | ICD-10-CM | POA: Diagnosis not present

## 2015-04-25 DIAGNOSIS — Z7901 Long term (current) use of anticoagulants: Secondary | ICD-10-CM | POA: Diagnosis not present

## 2015-04-25 DIAGNOSIS — Z7982 Long term (current) use of aspirin: Secondary | ICD-10-CM | POA: Diagnosis not present

## 2015-04-27 DIAGNOSIS — I10 Essential (primary) hypertension: Secondary | ICD-10-CM | POA: Diagnosis not present

## 2015-04-27 DIAGNOSIS — S72002D Fracture of unspecified part of neck of left femur, subsequent encounter for closed fracture with routine healing: Secondary | ICD-10-CM | POA: Diagnosis not present

## 2015-04-27 DIAGNOSIS — K5090 Crohn's disease, unspecified, without complications: Secondary | ICD-10-CM | POA: Diagnosis not present

## 2015-04-27 DIAGNOSIS — K259 Gastric ulcer, unspecified as acute or chronic, without hemorrhage or perforation: Secondary | ICD-10-CM | POA: Diagnosis not present

## 2015-04-27 DIAGNOSIS — Z9181 History of falling: Secondary | ICD-10-CM | POA: Diagnosis not present

## 2015-04-27 DIAGNOSIS — K439 Ventral hernia without obstruction or gangrene: Secondary | ICD-10-CM | POA: Diagnosis not present

## 2015-04-28 NOTE — Progress Notes (Signed)
Patient ID: [name], female   DOB: [birth year], 69 y.o.   MRN: [id number]                PROGRESS NOTE  DATE:  04/23/2015        FACILITY: [facility]                  LEVEL OF CARE:   SNF   Acute Visit/Discharge Visit   CHIEF COMPLAINT:  Pre-discharge review.       HISTORY OF PRESENT ILLNESS:  This is a patient who is demanding to go home today.  I do not think therapy is fully ready to discharge her.  However, she had her sutures removed and wants to go home.     She is a lady who suffered a mechanical fall at home without a prior history of falling.  She suffered a left subcapital hip fracture and underwent a partial left hip replacement.  She has done well.  She is up walking with a walker.  In fact, she walked down to therapy today.    She has most recently been in hospital from 02/21/2015 through 02/25/2015, presenting with a small bowel obstruction secondary to adhesions.  She has a history of Crohn's disease of the small bowel.  EGD at the time showed multiple ulcerations and erosions.  This has not been an issue while she has been here.  She is on proton pump inhibitors.  Her hemoglobin today is up to 11.2, up from 10.2 on 04/13/2015.    Lab work from today did show hypokalemia with a potassium of 3.2.   She is not on diuretics.  I note that on 04/09/2015, her potassium was 3.3.  Presumably, this was corrected and we have had some potassium shifts at the cellular level.  I do not see another good reason for this.    PAST MEDICAL HISTORY/PROBLEM LIST:   Reviewed.      CURRENT MEDICATIONS:  Medication list is reviewed.               Tylenol 500 q.6 p.r.n.     Carafate 1 g four times a day.    Mesalamine 400 mg, 2 capsules b.i.d.      Lovenox 40 mg daily (DVT prophylaxis).     MiraLAX 17 g daily.    Niferex 150 daily.      Omeprazole 20 b.i.d.     Robaxin 500 q.8 p.r.n.      Zofran 4 mg q.8.     Nicotine patch 21 mg/hr transdermal patch.     Hydrocodone 5/325 q.4 p.r.n.      ASA 81 q.d.     PHYSICAL EXAMINATION:   GENERAL APPEARANCE:  The patient does not look to be in any distress.   CHEST/RESPIRATORY:  Clear air entry bilaterally.    CARDIOVASCULAR:   CARDIAC:  Heart sounds are normal.  She appears to be euvolemic.       GASTROINTESTINAL:   ABDOMEN:  Soft.    LIVER/SPLEEN/KIDNEYS:  No liver, no spleen.  No tenderness.      ASSESSMENT/PLAN:                Hip fracture.  Status post partial left hip replacement.  She has done well.  She is on Lovenox for DVT prophylaxis, which she probably will not use.  She does not require narcotics.    Crohn's disease.  This has been quiescent while she has been here.  Her hemoglobin is 11.2.  Postoperative loss now replaced on iron.    Hypokalemia.  I am not exactly certain what is behind this.  I am assuming she got behind in the hospital and in cellular shifts.  I will give her six doses of 20 b.i.d. and have her follow up with her primary doctor.       CPT CODE: 93734

## 2015-04-30 DIAGNOSIS — Z9181 History of falling: Secondary | ICD-10-CM | POA: Diagnosis not present

## 2015-04-30 DIAGNOSIS — I10 Essential (primary) hypertension: Secondary | ICD-10-CM | POA: Diagnosis not present

## 2015-04-30 DIAGNOSIS — K259 Gastric ulcer, unspecified as acute or chronic, without hemorrhage or perforation: Secondary | ICD-10-CM | POA: Diagnosis not present

## 2015-04-30 DIAGNOSIS — K5090 Crohn's disease, unspecified, without complications: Secondary | ICD-10-CM | POA: Diagnosis not present

## 2015-04-30 DIAGNOSIS — S72002D Fracture of unspecified part of neck of left femur, subsequent encounter for closed fracture with routine healing: Secondary | ICD-10-CM | POA: Diagnosis not present

## 2015-04-30 DIAGNOSIS — K439 Ventral hernia without obstruction or gangrene: Secondary | ICD-10-CM | POA: Diagnosis not present

## 2015-05-02 DIAGNOSIS — S72002D Fracture of unspecified part of neck of left femur, subsequent encounter for closed fracture with routine healing: Secondary | ICD-10-CM | POA: Diagnosis not present

## 2015-05-02 DIAGNOSIS — I10 Essential (primary) hypertension: Secondary | ICD-10-CM | POA: Diagnosis not present

## 2015-05-02 DIAGNOSIS — K439 Ventral hernia without obstruction or gangrene: Secondary | ICD-10-CM | POA: Diagnosis not present

## 2015-05-02 DIAGNOSIS — K259 Gastric ulcer, unspecified as acute or chronic, without hemorrhage or perforation: Secondary | ICD-10-CM | POA: Diagnosis not present

## 2015-05-02 DIAGNOSIS — Z9181 History of falling: Secondary | ICD-10-CM | POA: Diagnosis not present

## 2015-05-02 DIAGNOSIS — K5090 Crohn's disease, unspecified, without complications: Secondary | ICD-10-CM | POA: Diagnosis not present

## 2015-05-04 DIAGNOSIS — S72002D Fracture of unspecified part of neck of left femur, subsequent encounter for closed fracture with routine healing: Secondary | ICD-10-CM | POA: Diagnosis not present

## 2015-05-04 DIAGNOSIS — K5090 Crohn's disease, unspecified, without complications: Secondary | ICD-10-CM | POA: Diagnosis not present

## 2015-05-04 DIAGNOSIS — K439 Ventral hernia without obstruction or gangrene: Secondary | ICD-10-CM | POA: Diagnosis not present

## 2015-05-04 DIAGNOSIS — K259 Gastric ulcer, unspecified as acute or chronic, without hemorrhage or perforation: Secondary | ICD-10-CM | POA: Diagnosis not present

## 2015-05-04 DIAGNOSIS — Z9181 History of falling: Secondary | ICD-10-CM | POA: Diagnosis not present

## 2015-05-04 DIAGNOSIS — I10 Essential (primary) hypertension: Secondary | ICD-10-CM | POA: Diagnosis not present

## 2015-05-06 DIAGNOSIS — K439 Ventral hernia without obstruction or gangrene: Secondary | ICD-10-CM | POA: Diagnosis not present

## 2015-05-06 DIAGNOSIS — Z9181 History of falling: Secondary | ICD-10-CM | POA: Diagnosis not present

## 2015-05-06 DIAGNOSIS — K259 Gastric ulcer, unspecified as acute or chronic, without hemorrhage or perforation: Secondary | ICD-10-CM | POA: Diagnosis not present

## 2015-05-06 DIAGNOSIS — S72002D Fracture of unspecified part of neck of left femur, subsequent encounter for closed fracture with routine healing: Secondary | ICD-10-CM | POA: Diagnosis not present

## 2015-05-06 DIAGNOSIS — I10 Essential (primary) hypertension: Secondary | ICD-10-CM | POA: Diagnosis not present

## 2015-05-06 DIAGNOSIS — K5090 Crohn's disease, unspecified, without complications: Secondary | ICD-10-CM | POA: Diagnosis not present

## 2015-05-08 ENCOUNTER — Ambulatory Visit (INDEPENDENT_AMBULATORY_CARE_PROVIDER_SITE_OTHER): Payer: Self-pay | Admitting: Orthopedic Surgery

## 2015-05-08 VITALS — BP 149/76 | Ht 63.0 in | Wt 131.0 lb

## 2015-05-08 DIAGNOSIS — S72002D Fracture of unspecified part of neck of left femur, subsequent encounter for closed fracture with routine healing: Secondary | ICD-10-CM

## 2015-05-08 NOTE — Progress Notes (Signed)
Chief Complaint  Patient presents with  . Follow-up    4 week follow up left hip   Bipolar replacement left hip  Patient has no complaints using her walker well. She's been on for couple weeks now she is receiving therapy she is doing well her incision looks great or leg lengths are equal she has no peripheral edema she has 120 of hip flexion  Follow-up

## 2015-05-09 DIAGNOSIS — K5090 Crohn's disease, unspecified, without complications: Secondary | ICD-10-CM | POA: Diagnosis not present

## 2015-05-09 DIAGNOSIS — Z9181 History of falling: Secondary | ICD-10-CM | POA: Diagnosis not present

## 2015-05-09 DIAGNOSIS — K259 Gastric ulcer, unspecified as acute or chronic, without hemorrhage or perforation: Secondary | ICD-10-CM | POA: Diagnosis not present

## 2015-05-09 DIAGNOSIS — S72002D Fracture of unspecified part of neck of left femur, subsequent encounter for closed fracture with routine healing: Secondary | ICD-10-CM | POA: Diagnosis not present

## 2015-05-09 DIAGNOSIS — K439 Ventral hernia without obstruction or gangrene: Secondary | ICD-10-CM | POA: Diagnosis not present

## 2015-05-09 DIAGNOSIS — I10 Essential (primary) hypertension: Secondary | ICD-10-CM | POA: Diagnosis not present

## 2015-05-15 DIAGNOSIS — Z9181 History of falling: Secondary | ICD-10-CM | POA: Diagnosis not present

## 2015-05-15 DIAGNOSIS — K5090 Crohn's disease, unspecified, without complications: Secondary | ICD-10-CM | POA: Diagnosis not present

## 2015-05-15 DIAGNOSIS — S72002D Fracture of unspecified part of neck of left femur, subsequent encounter for closed fracture with routine healing: Secondary | ICD-10-CM | POA: Diagnosis not present

## 2015-05-15 DIAGNOSIS — K439 Ventral hernia without obstruction or gangrene: Secondary | ICD-10-CM | POA: Diagnosis not present

## 2015-05-15 DIAGNOSIS — I10 Essential (primary) hypertension: Secondary | ICD-10-CM | POA: Diagnosis not present

## 2015-05-15 DIAGNOSIS — K259 Gastric ulcer, unspecified as acute or chronic, without hemorrhage or perforation: Secondary | ICD-10-CM | POA: Diagnosis not present

## 2015-05-17 DIAGNOSIS — K439 Ventral hernia without obstruction or gangrene: Secondary | ICD-10-CM | POA: Diagnosis not present

## 2015-05-17 DIAGNOSIS — K259 Gastric ulcer, unspecified as acute or chronic, without hemorrhage or perforation: Secondary | ICD-10-CM | POA: Diagnosis not present

## 2015-05-17 DIAGNOSIS — S72002D Fracture of unspecified part of neck of left femur, subsequent encounter for closed fracture with routine healing: Secondary | ICD-10-CM | POA: Diagnosis not present

## 2015-05-17 DIAGNOSIS — I10 Essential (primary) hypertension: Secondary | ICD-10-CM | POA: Diagnosis not present

## 2015-05-17 DIAGNOSIS — K5090 Crohn's disease, unspecified, without complications: Secondary | ICD-10-CM | POA: Diagnosis not present

## 2015-05-17 DIAGNOSIS — Z9181 History of falling: Secondary | ICD-10-CM | POA: Diagnosis not present

## 2015-05-21 DIAGNOSIS — Z9181 History of falling: Secondary | ICD-10-CM | POA: Diagnosis not present

## 2015-05-21 DIAGNOSIS — S72002D Fracture of unspecified part of neck of left femur, subsequent encounter for closed fracture with routine healing: Secondary | ICD-10-CM | POA: Diagnosis not present

## 2015-05-21 DIAGNOSIS — K5090 Crohn's disease, unspecified, without complications: Secondary | ICD-10-CM | POA: Diagnosis not present

## 2015-05-21 DIAGNOSIS — K439 Ventral hernia without obstruction or gangrene: Secondary | ICD-10-CM | POA: Diagnosis not present

## 2015-05-21 DIAGNOSIS — I10 Essential (primary) hypertension: Secondary | ICD-10-CM | POA: Diagnosis not present

## 2015-05-21 DIAGNOSIS — K259 Gastric ulcer, unspecified as acute or chronic, without hemorrhage or perforation: Secondary | ICD-10-CM | POA: Diagnosis not present

## 2015-05-23 ENCOUNTER — Telehealth: Payer: Self-pay | Admitting: *Deleted

## 2015-05-23 DIAGNOSIS — K259 Gastric ulcer, unspecified as acute or chronic, without hemorrhage or perforation: Secondary | ICD-10-CM | POA: Diagnosis not present

## 2015-05-23 DIAGNOSIS — Z9181 History of falling: Secondary | ICD-10-CM | POA: Diagnosis not present

## 2015-05-23 DIAGNOSIS — K5090 Crohn's disease, unspecified, without complications: Secondary | ICD-10-CM | POA: Diagnosis not present

## 2015-05-23 DIAGNOSIS — K439 Ventral hernia without obstruction or gangrene: Secondary | ICD-10-CM | POA: Diagnosis not present

## 2015-05-23 DIAGNOSIS — S72002D Fracture of unspecified part of neck of left femur, subsequent encounter for closed fracture with routine healing: Secondary | ICD-10-CM | POA: Diagnosis not present

## 2015-05-23 DIAGNOSIS — I10 Essential (primary) hypertension: Secondary | ICD-10-CM | POA: Diagnosis not present

## 2015-05-23 NOTE — Telephone Encounter (Signed)
Therapist Tressia Danas called to request 2 additional weeks of therapy   Gave verbal order to extend

## 2015-05-28 DIAGNOSIS — I10 Essential (primary) hypertension: Secondary | ICD-10-CM | POA: Diagnosis not present

## 2015-05-28 DIAGNOSIS — S72002D Fracture of unspecified part of neck of left femur, subsequent encounter for closed fracture with routine healing: Secondary | ICD-10-CM | POA: Diagnosis not present

## 2015-05-28 DIAGNOSIS — Z9181 History of falling: Secondary | ICD-10-CM | POA: Diagnosis not present

## 2015-05-28 DIAGNOSIS — K5090 Crohn's disease, unspecified, without complications: Secondary | ICD-10-CM | POA: Diagnosis not present

## 2015-05-28 DIAGNOSIS — K439 Ventral hernia without obstruction or gangrene: Secondary | ICD-10-CM | POA: Diagnosis not present

## 2015-05-28 DIAGNOSIS — K259 Gastric ulcer, unspecified as acute or chronic, without hemorrhage or perforation: Secondary | ICD-10-CM | POA: Diagnosis not present

## 2015-05-31 DIAGNOSIS — Z9181 History of falling: Secondary | ICD-10-CM | POA: Diagnosis not present

## 2015-05-31 DIAGNOSIS — I10 Essential (primary) hypertension: Secondary | ICD-10-CM | POA: Diagnosis not present

## 2015-05-31 DIAGNOSIS — K439 Ventral hernia without obstruction or gangrene: Secondary | ICD-10-CM | POA: Diagnosis not present

## 2015-05-31 DIAGNOSIS — K259 Gastric ulcer, unspecified as acute or chronic, without hemorrhage or perforation: Secondary | ICD-10-CM | POA: Diagnosis not present

## 2015-05-31 DIAGNOSIS — S72002D Fracture of unspecified part of neck of left femur, subsequent encounter for closed fracture with routine healing: Secondary | ICD-10-CM | POA: Diagnosis not present

## 2015-05-31 DIAGNOSIS — K5090 Crohn's disease, unspecified, without complications: Secondary | ICD-10-CM | POA: Diagnosis not present

## 2015-06-05 DIAGNOSIS — S72002D Fracture of unspecified part of neck of left femur, subsequent encounter for closed fracture with routine healing: Secondary | ICD-10-CM | POA: Diagnosis not present

## 2015-06-05 DIAGNOSIS — I10 Essential (primary) hypertension: Secondary | ICD-10-CM | POA: Diagnosis not present

## 2015-06-05 DIAGNOSIS — K259 Gastric ulcer, unspecified as acute or chronic, without hemorrhage or perforation: Secondary | ICD-10-CM | POA: Diagnosis not present

## 2015-06-05 DIAGNOSIS — K439 Ventral hernia without obstruction or gangrene: Secondary | ICD-10-CM | POA: Diagnosis not present

## 2015-06-05 DIAGNOSIS — Z9181 History of falling: Secondary | ICD-10-CM | POA: Diagnosis not present

## 2015-06-05 DIAGNOSIS — K5090 Crohn's disease, unspecified, without complications: Secondary | ICD-10-CM | POA: Diagnosis not present

## 2015-06-07 DIAGNOSIS — I10 Essential (primary) hypertension: Secondary | ICD-10-CM | POA: Diagnosis not present

## 2015-06-07 DIAGNOSIS — K439 Ventral hernia without obstruction or gangrene: Secondary | ICD-10-CM | POA: Diagnosis not present

## 2015-06-07 DIAGNOSIS — Z9181 History of falling: Secondary | ICD-10-CM | POA: Diagnosis not present

## 2015-06-07 DIAGNOSIS — S72002D Fracture of unspecified part of neck of left femur, subsequent encounter for closed fracture with routine healing: Secondary | ICD-10-CM | POA: Diagnosis not present

## 2015-06-07 DIAGNOSIS — K5090 Crohn's disease, unspecified, without complications: Secondary | ICD-10-CM | POA: Diagnosis not present

## 2015-06-07 DIAGNOSIS — K259 Gastric ulcer, unspecified as acute or chronic, without hemorrhage or perforation: Secondary | ICD-10-CM | POA: Diagnosis not present

## 2015-06-22 ENCOUNTER — Encounter (INDEPENDENT_AMBULATORY_CARE_PROVIDER_SITE_OTHER): Payer: Self-pay | Admitting: *Deleted

## 2015-06-28 ENCOUNTER — Telehealth (INDEPENDENT_AMBULATORY_CARE_PROVIDER_SITE_OTHER): Payer: Self-pay | Admitting: *Deleted

## 2015-06-28 NOTE — Telephone Encounter (Signed)
Insurance will not cover the Delzicol , it will cover this year - Balsalazide , Sulfasalazine , Apriso. Per Dr.Rehman the may call in Apriso 0.375 mg Take 4 capsules daily in the morning #120 5 refills. This was called to [facility]. Patient was called and made aware.

## 2015-07-03 ENCOUNTER — Ambulatory Visit (INDEPENDENT_AMBULATORY_CARE_PROVIDER_SITE_OTHER): Payer: Self-pay | Admitting: Orthopedic Surgery

## 2015-07-03 VITALS — BP 87/61 | Ht 63.0 in | Wt 131.0 lb

## 2015-07-03 DIAGNOSIS — S72002D Fracture of unspecified part of neck of left femur, subsequent encounter for closed fracture with routine healing: Secondary | ICD-10-CM

## 2015-07-03 NOTE — Progress Notes (Signed)
Patient ID: [name], female   DOB: [birth year], 70 y.o.   MRN: [id number]  Chief Complaint  Patient presents with  . Follow-up    6 week follow up Left hip, DOS 04/09/15    BP 87/61 mmHg  Ht 5' 3"  (1.6 m)  Wt 131 lb (59.421 kg)  BMI 23.21 kg/m2  Bipolar left hip for fracture 3 months out doing well ambulating with a cane leg lengths are equal patient complains of groin pain when she first gets up but it goes away during the day    ASSESSMENT AND PLAN   Stable postop left partial hip for fracture doing well ambulating with a cane follow-up at one year follow up for x-ray [facility] pelvis

## 2015-07-07 ENCOUNTER — Emergency Department (HOSPITAL_COMMUNITY)
Admission: EM | Admit: 2015-07-07 | Discharge: 2015-07-07 | Disposition: A | Discharge status: Home / Self Care | Payer: Medicare Other | Attending: Emergency Medicine | Admitting: Emergency Medicine

## 2015-07-07 ENCOUNTER — Encounter (HOSPITAL_COMMUNITY): Payer: Self-pay | Admitting: Emergency Medicine

## 2015-07-07 ENCOUNTER — Emergency Department (HOSPITAL_COMMUNITY): Payer: Medicare Other

## 2015-07-07 DIAGNOSIS — I10 Essential (primary) hypertension: Secondary | ICD-10-CM | POA: Insufficient documentation

## 2015-07-07 DIAGNOSIS — Z9049 Acquired absence of other specified parts of digestive tract: Secondary | ICD-10-CM | POA: Diagnosis not present

## 2015-07-07 DIAGNOSIS — Z8673 Personal history of transient ischemic attack (TIA), and cerebral infarction without residual deficits: Secondary | ICD-10-CM | POA: Insufficient documentation

## 2015-07-07 DIAGNOSIS — Z79899 Other long term (current) drug therapy: Secondary | ICD-10-CM | POA: Insufficient documentation

## 2015-07-07 DIAGNOSIS — K439 Ventral hernia without obstruction or gangrene: Secondary | ICD-10-CM | POA: Insufficient documentation

## 2015-07-07 DIAGNOSIS — Z7982 Long term (current) use of aspirin: Secondary | ICD-10-CM | POA: Insufficient documentation

## 2015-07-07 DIAGNOSIS — Z9851 Tubal ligation status: Secondary | ICD-10-CM | POA: Diagnosis not present

## 2015-07-07 DIAGNOSIS — M1990 Unspecified osteoarthritis, unspecified site: Secondary | ICD-10-CM | POA: Insufficient documentation

## 2015-07-07 DIAGNOSIS — F17200 Nicotine dependence, unspecified, uncomplicated: Secondary | ICD-10-CM | POA: Insufficient documentation

## 2015-07-07 DIAGNOSIS — R109 Unspecified abdominal pain: Secondary | ICD-10-CM | POA: Diagnosis not present

## 2015-07-07 DIAGNOSIS — R1031 Right lower quadrant pain: Secondary | ICD-10-CM | POA: Diagnosis present

## 2015-07-07 LAB — COMPREHENSIVE METABOLIC PANEL
ALBUMIN: 4 g/dL (ref 3.5–5.0)
ALT: 15 U/L (ref 14–54)
AST: 18 U/L (ref 15–41)
Alkaline Phosphatase: 77 U/L (ref 38–126)
Anion gap: 8 (ref 5–15)
BILIRUBIN TOTAL: 0.5 mg/dL (ref 0.3–1.2)
BUN: 7 mg/dL (ref 6–20)
CHLORIDE: 103 mmol/L (ref 101–111)
CO2: 26 mmol/L (ref 22–32)
CREATININE: 0.69 mg/dL (ref 0.44–1.00)
Calcium: 9.6 mg/dL (ref 8.9–10.3)
GFR calc Af Amer: >60 mL/min (ref >60)
GLUCOSE: 97 mg/dL (ref 65–99)
Potassium: 4.2 mmol/L (ref 3.5–5.1)
Sodium: 137 mmol/L (ref 135–145)
TOTAL PROTEIN: 7.4 g/dL (ref 6.5–8.1)

## 2015-07-07 LAB — CBC
HEMATOCRIT: 38.5 % (ref 36.0–46.0)
Hemoglobin: 12.9 g/dL (ref 12.0–15.0)
MCH: 31.2 pg (ref 26.0–34.0)
MCHC: 33.5 g/dL (ref 30.0–36.0)
MCV: 93.2 fL (ref 78.0–100.0)
PLATELETS: 263 10*3/uL (ref 150–400)
RBC: 4.13 MIL/uL (ref 3.87–5.11)
RDW: 14.4 % (ref 11.5–15.5)
WBC: 13 10*3/uL — AB (ref 4.0–10.5)

## 2015-07-07 LAB — URINE MICROSCOPIC-ADD ON
Bacteria, UA: NONE SEEN
Squamous Epithelial / LPF: NONE SEEN
WBC, UA: NONE SEEN WBC/hpf (ref 0–5)

## 2015-07-07 LAB — URINALYSIS, ROUTINE W REFLEX MICROSCOPIC
BILIRUBIN URINE: NEGATIVE
GLUCOSE, UA: NEGATIVE mg/dL
KETONES UR: NEGATIVE mg/dL
LEUKOCYTES UA: NEGATIVE
Nitrite: NEGATIVE
PH: 5 (ref 5.0–8.0)
PROTEIN: NEGATIVE mg/dL
Specific Gravity, Urine: <1.005 — ABNORMAL LOW (ref 1.005–1.030)

## 2015-07-07 LAB — LIPASE, BLOOD: Lipase: 30 U/L (ref 11–51)

## 2015-07-07 MED ORDER — OXYCODONE-ACETAMINOPHEN 5-325 MG PO TABS: 1.0000 | ORAL_TABLET | ORAL | Status: DC | PRN

## 2015-07-07 MED ORDER — FENTANYL CITRATE (PF) 100 MCG/2ML IJ SOLN
50.0000 ug | Freq: Once | INTRAMUSCULAR | Status: AC
  Administered 2015-07-07: 50 ug via INTRAVENOUS
  Filled 2015-07-07: qty 2

## 2015-07-07 MED ORDER — SODIUM CHLORIDE 0.9 % IV BOLUS (SEPSIS)
500.0000 mL | Freq: Once | INTRAVENOUS | Status: AC
  Administered 2015-07-07: 500 mL via INTRAVENOUS

## 2015-07-07 MED ORDER — IOHEXOL 300 MG/ML  SOLN
25.0000 mL | Freq: Once | INTRAMUSCULAR | Status: AC | PRN
  Administered 2015-07-07: 25 mL via ORAL

## 2015-07-07 MED ORDER — IOHEXOL 300 MG/ML  SOLN
100.0000 mL | Freq: Once | INTRAMUSCULAR | Status: AC | PRN
  Administered 2015-07-07: 100 mL via INTRAVENOUS

## 2015-07-07 NOTE — ED Notes (Signed)
PT c/o RLQ pain into right flank x2 days and denies any urinary symptoms and states normal BM today. PT denies any fevers or loss of appetite and states has had pain since cleaning and putting up cat food x3 days ago.

## 2015-07-07 NOTE — ED Provider Notes (Signed)
CSN: [id number]     Arrival date & time 07/07/15  1118 History   First MD Initiated Contact with Patient 07/07/15 1242     Chief Complaint  Patient presents with  . Abdominal Pain     (Consider location/radiation/quality/duration/timing/severity/associated sxs/prior Treatment) HPI.... Right lower quadrant abdominal pain for approximately 2 days. Patient has had previous hernia repairs in her ventral abdomen. She is able to eat. History of Crohn's disease. No dysuria, hematuria, nausea, vomiting, diarrhea, fever. Severity of pain is moderate.  Past Medical History  Diagnosis Date  . Crohn's disease ([place])   . Hypertension   . Stroke ([place])   . Arthritis   . Sciatica of right side   . Degenerative disc disease, lumbar   . Ventral hernia    Past Surgical History  Procedure Laterality Date  . Cholecystectomy      2008  . Carotid endarterectomy      2004  . Hemicolectomy      right  . Tubal ligation    . Incisional hernia repair N/A 05/20/2013    Procedure: [name] HERNIORRHAPHY WITH MESH;  Surgeon: Jamesetta So, MD;  Location: [facility];  Service: General;  Laterality: N/A;  . Insertion of mesh N/A 05/20/2013    Procedure: INSERTION OF MESH;  Surgeon: Jamesetta So, MD;  Location: [facility];  Service: General;  Laterality: N/A;  . Incisional hernia repair N/A 05/19/2014    Procedure: RECURRENT HERNIA REPAIR INCISIONAL;  Surgeon: Jamesetta So, MD;  Location: [facility];  Service: General;  Laterality: N/A;  . Insertion of mesh N/A 05/19/2014    Procedure: INSERTION OF MESH;  Surgeon: Jamesetta So, MD;  Location: [facility];  Service: General;  Laterality: N/A;  . Esophagogastroduodenoscopy N/A 02/24/2015    Procedure: ESOPHAGOGASTRODUODENOSCOPY (EGD);  Surgeon: Daneil Dolin, MD;  Location: [facility];  Service: Endoscopy;  Laterality: N/A;  . Hip arthroplasty Left 04/09/2015    Procedure: INSERT PARTIAL HIP REPLACEMENT LEFT;  Surgeon: Carole Civil, MD;  Location: [facility];   Service: Orthopedics;  Laterality: Left;   Family History  Problem Relation Age of Onset  . GI problems Father   . Stroke Brother   . Heart attack Mother   . Hypotension Sister    Social History  Substance Use Topics  . Smoking status: Current Every Day Smoker -- 1.00 packs/day for 30 years  . Smokeless tobacco: Never Used     Comment: 1-2 pack day x 20 yrs.  . Alcohol Use: No     Comment: one every 2 -3 months.   OB History    No data available     Review of Systems  All other systems reviewed and are negative.     Allergies  Flagyl and Other  Home Medications   Prior to Admission medications   Medication Sig Start Date End Date Taking? Authorizing Provider  acetaminophen (TYLENOL) 500 MG tablet Take 1 tablet (500 mg total) by mouth every 6 (six) hours as needed for mild pain. 04/12/15  Yes Samuella Cota, MD  aspirin EC 81 MG tablet Take 1 tablet (81 mg total) by mouth daily. Resume in 1 week Patient taking differently: Take 81 mg by mouth daily.  02/25/15  Yes Kathie Dike, MD  DELZICOL 400 MG CPDR DR capsule TAKE 2 CAPSULES BY MOUTH TWICE DAILY. 01/05/15  Yes Butch Penny, NP  lisinopril (PRINIVIL,ZESTRIL) 20 MG tablet Take 10 mg by mouth daily.   Yes Historical Provider,  MD  oxyCODONE-acetaminophen (PERCOCET) 5-325 MG tablet Take 1 tablet by mouth every 4 (four) hours as needed. 07/07/15   Nat Christen, MD   BP 125/78 mmHg  Pulse 76  Temp(Src) 97.6 F (36.4 C) (Oral)  Resp 14  Ht 5\' 2"  (1.575 m)  Wt 131 lb (59.421 kg)  BMI 23.95 kg/m2  SpO2 100% Physical Exam  Constitutional: She is oriented to person, place, and time. She appears well-developed and well-nourished.  HENT:  Head: Normocephalic and atraumatic.  Eyes: Conjunctivae and EOM are normal. Pupils are equal, round, and reactive to light.  Neck: Normal range of motion. Neck supple.  Cardiovascular: Normal rate and regular rhythm.   Pulmonary/Chest: Effort normal and breath sounds normal.   Abdominal: Soft. Bowel sounds are normal.  2 cm ventral hernia palpated in right lower quadrant  Musculoskeletal: Normal range of motion.  Neurological: She is alert and oriented to person, place, and time.  Skin: Skin is warm and dry.  Psychiatric: She has a normal mood and affect. Her behavior is normal.  Nursing note and vitals reviewed.   ED Course  Procedures (including critical care time) Labs Review Labs Reviewed  CBC - Abnormal; Notable for the following:    WBC 13.0 (*)    All other components within normal limits  URINALYSIS, ROUTINE W REFLEX MICROSCOPIC (NOT AT [facility]) - Abnormal; Notable for the following:    Specific Gravity, Urine <1.005 (*)    Hgb urine dipstick TRACE (*)    All other components within normal limits  LIPASE, BLOOD  COMPREHENSIVE METABOLIC PANEL  URINE MICROSCOPIC-ADD ON    Imaging Review Ct Abdomen Pelvis W Contrast  07/07/2015  CLINICAL DATA:  70 year old female with right side abdominal pain radiating to the back and groin. Query ventral hernia. Chronic Crohn disease. Initial encounter. EXAM: CT ABDOMEN AND PELVIS WITH CONTRAST TECHNIQUE: Multidetector CT imaging of the abdomen and pelvis was performed using the standard protocol following bolus administration of intravenous contrast. CONTRAST:  95mL OMNIPAQUE IOHEXOL 300 MG/ML SOLN, 133mL OMNIPAQUE IOHEXOL 300 MG/ML SOLN COMPARISON:  CT Abdomen and Pelvis 02/21/2015 and earlier. FINDINGS: No pericardial or pleural effusion. Calcified coronary artery atherosclerosis. Minor dependent atelectasis or scarring in the right lower lobe. Osteopenia. Mild scoliosis. Degenerative changes in the spine. Left hip arthroplasty is new since September. No acute osseous abnormality identified. Streak artifact from the left hip hardware mildly degrades visualization of the pelvis. No pelvic free fluid. Negative visible uterus and adnexa. Diminutive urinary bladder. Redundant distal colon with sigmoid diverticulosis. No  definite active inflammation (streak artifact from the left hip hardware in this region). Diverticulosis continues into the left colon and splenic flexure, no active inflammation and 0 segments are decompressed. There is gas and fluid in the transverse colon which otherwise appears normal. Fluid in the right colon with no wall thickening. Neo terminal ileum postoperative changes. Oral contrast has not yet reached the distal small bowel. No dilated or inflamed distal small bowel loops. Proximal small bowel within normal limits. Oral contrast in the stomach which appears normal. Chronic duodenum diverticula, otherwise negative duodenum. No abdominal free air or free fluid. Surgically absent gallbladder. Liver, spleen, pancreas (stable mild prominence of the main pancreatic duct), and adrenal glands are within normal limits. Stable chronic CBD enlargement at the porta hepatis which tapers distally as before. Portal venous system appears patent. Extensive Aortoiliac calcified atherosclerosis noted. Major arterial structures are patent. No lymphadenopathy. There does appear to be a small chronic fat containing right lower  quadrant abdomen spigelian hernia. This may be related to a prior operative site (series 2, image 57). This is subtle, does not appear inflamed, and has not significantly changed since 2016. The patient is status post repair of a much larger midline and left paracentral abdominal hernia which contained mesentery and bowel in 2012. No recurrence of that hernia identified. No inguinal hernia identified. IMPRESSION: 1. No acute or inflammatory process identified in the abdomen or pelvis. 2. There is a small fat containing right lower quadrant spigelian hernia abdominal hernia which is subtle and stable since 2016. This does not appear incarcerated. The patient's repaired much larger left paracentral ventral abdominal hernia has not recurred. 3. Interval left hip arthroplasty. 4. Other stable chronic  findings detailed above. Electronically Signed   By: Genevie Ann M.D.   On: 07/07/2015 15:42   I have personally reviewed and evaluated these images and lab results as part of my medical decision-making.   EKG Interpretation None      MDM   Final diagnoses:  Ventral hernia without obstruction or gangrene    CT scan reveals a small right lower quadrant hernia without obstruction or incarceration. Discussed with patient and her daughter. She will follow-up with general surgery. Discharge medications Percocet    Nat Christen, MD 07/07/15 1630

## 2015-07-07 NOTE — Discharge Instructions (Signed)
You have a small hernia in your abdomen. Prescription for pain medicine. Follow-up with the general surgeon. Phone number given for Dr. Aviva Signs

## 2015-07-24 DIAGNOSIS — I739 Peripheral vascular disease, unspecified: Secondary | ICD-10-CM | POA: Diagnosis not present

## 2015-07-24 DIAGNOSIS — B351 Tinea unguium: Secondary | ICD-10-CM | POA: Diagnosis not present

## 2015-07-31 DIAGNOSIS — K439 Ventral hernia without obstruction or gangrene: Secondary | ICD-10-CM | POA: Diagnosis not present

## 2015-08-21 ENCOUNTER — Encounter (INDEPENDENT_AMBULATORY_CARE_PROVIDER_SITE_OTHER): Payer: Self-pay | Admitting: Internal Medicine

## 2015-08-21 ENCOUNTER — Ambulatory Visit (INDEPENDENT_AMBULATORY_CARE_PROVIDER_SITE_OTHER): Payer: Medicare Other | Admitting: Internal Medicine

## 2015-08-21 VITALS — BP 124/82 | HR 80 | Temp 97.8°F | Ht 62.0 in | Wt 133.0 lb

## 2015-08-21 DIAGNOSIS — K5010 Crohn's disease of large intestine without complications: Secondary | ICD-10-CM

## 2015-08-21 NOTE — Progress Notes (Signed)
Subjective:    Patient ID: [name], female    DOB: [birth year], 70 y.o.   MRN: [id number]   HPI Here today for f/u of her small bowel Crohn's. She was last seen in March of 2016 by Dr. Laural Golden. She tells me she is doing good. She is accompanied by her daughter. She has no complaints. She is having a BM daily. No melena or BRRB. Stools ar nice and formed. She continues to smoke.  She has gained 3 pounds since her lat visit.   She is on ASA 65m daily.  She is not having any abdominal pain.   CBC    Component Value Date/Time   WBC 13.0* 07/07/2015 1255   RBC 4.13 07/07/2015 1255   HGB 12.9 07/07/2015 1255   HCT 38.5 07/07/2015 1255   PLT 263 07/07/2015 1255   MCV 93.2 07/07/2015 1255   MCH 31.2 07/07/2015 1255   MCHC 33.5 07/07/2015 1255   RDW 14.4 07/07/2015 1255   LYMPHSABS 1.3 04/13/2015 0735   MONOABS 0.6 04/13/2015 0735   EOSABS 0.2 04/13/2015 0735   BASOSABS 0.0 04/13/2015 0735   CMP Latest Ref Rng 07/07/2015 04/23/2015 04/13/2015  Glucose 65 - 99 mg/dL 97 103(H) 95  BUN 6 - 20 mg/dL 7 7 8   Creatinine 0.44 - 1.00 mg/dL 0.69 0.73 0.65  Sodium 135 - 145 mmol/L 137 140 136  Potassium 3.5 - 5.1 mmol/L 4.2 3.2(L) 4.1  Chloride 101 - 111 mmol/L 103 102 100(L)  CO2 22 - 32 mmol/L 26 29 28   Calcium 8.9 - 10.3 mg/dL 9.6 9.1 8.4(L)  Total Protein 6.5 - 8.1 g/dL 7.4 - -  Total Bilirubin 0.3 - 1.2 mg/dL 0.5 - -  Alkaline Phos 38 - 126 U/L 77 - -  AST 15 - 41 U/L 18 - -  ALT 14 - 54 U/L 15 - -    Lipase     Component Value Date/Time   LIPASE 30 07/07/2015 1255            Review of Systems Past Medical History  Diagnosis Date  . [place]'s disease ([place]   . Hypertension   . Stroke ([place]   . Arthritis   . Sciatica of right side   . Degenerative disc disease, lumbar   . Ventral hernia     Past Surgical History  Procedure Laterality Date  . Cholecystectomy      2008  . Carotid endarterectomy      2004  . Hemicolectomy      right  . Tubal ligation    .  Incisional hernia repair N/A 05/20/2013    Procedure: IFatima BlankHERNIORRHAPHY WITH MESH;  Surgeon: MJamesetta So MD;  [facility];  Service: General;  Laterality: N/A;  . Insertion of mesh N/A 05/20/2013    Procedure: INSERTION OF MESH;  Surgeon: MJamesetta So MD;  Location: [facility];  Service: General;  Laterality: N/A;  . Incisional hernia repair N/A 05/19/2014    Procedure: RECURRENT HERNIA REPAIR INCISIONAL;  Surgeon: MJamesetta So MD;  Location: [facility];  Service: General;  Laterality: N/A;  . Insertion of mesh N/A 05/19/2014    Procedure: INSERTION OF MESH;  Surgeon: MJamesetta So MD;  Location: [facility];  Service: General;  Laterality: N/A;  . Esophagogastroduodenoscopy N/A 02/24/2015    Procedure: ESOPHAGOGASTRODUODENOSCOPY (EGD);  Surgeon: RDaneil Dolin MD;  [facility];  Service: Endoscopy;  Laterality: N/A;  . Hip arthroplasty Left 04/09/2015  Procedure: INSERT PARTIAL HIP REPLACEMENT LEFT;  Surgeon: Carole Civil, MD;  Location: [facility];  Service: Orthopedics;  Laterality: Left;    Allergies  Allergen Reactions  . Flagyl [Metronidazole Hcl] Other (See Comments)    "makes me feel whoosy and weird"  . Other     Unknown antibiotic and unknown pain medications     Current Outpatient Prescriptions on File Prior to Visit  Medication Sig Dispense Refill  . acetaminophen (TYLENOL) 500 MG tablet Take 1 tablet (500 mg total) by mouth every 6 (six) hours as needed for mild pain.    Marland Kitchen aspirin EC 81 MG tablet Take 1 tablet (81 mg total) by mouth daily. Resume in 1 week (Patient taking differently: Take 81 mg by mouth daily. )    . lisinopril (PRINIVIL,ZESTRIL) 20 MG tablet Take 10 mg by mouth daily.     No current facility-administered medications on file prior to visit.   Current Outpatient Prescriptions  Medication Sig Dispense Refill  . acetaminophen (TYLENOL) 500 MG tablet Take 1 tablet (500 mg total) by mouth every 6 (six) hours as needed for mild pain.      Marland Kitchen aspirin EC 81 MG tablet Take 1 tablet (81 mg total) by mouth daily. Resume in 1 week (Patient taking differently: Take 81 mg by mouth daily. )    . diphenhydrAMINE (SOMINEX) 25 MG tablet Take 25 mg by mouth at bedtime as needed for sleep.    Marland Kitchen lisinopril (PRINIVIL,ZESTRIL) 20 MG tablet Take 10 mg by mouth daily.    . mesalamine (APRISO) 0.375 g 24 hr capsule Take 375 mg by mouth daily. 4 tabs daily.     No current facility-administered medications for this visit.        Objective:   Physical Exam Blood pressure 124/82, pulse 80, temperature 97.8 F (36.6 C), height 5' 2"  (1.575 m), weight 133 lb (60.328 kg). Alert and oriented. Skin warm and dry. Oral mucosa is moist.   . Sclera anicteric, conjunctivae is pink. Thyroid not enlarged. No cervical lymphadenopathy. Lungs clear. Heart regular rate and rhythm.  Abdomen is soft. Bowel sounds are positive. No hepatomegaly. No abdominal masses felt. No tenderness.  No edema to lower extremities.          Assessment & Plan:  Small bowel Crohns. She seems to be doing well and in remission.  Will get a CBC, sed rate. OV in 1 year.

## 2015-08-21 NOTE — Patient Instructions (Signed)
OV in 1 year.  

## 2015-09-26 DIAGNOSIS — M1991 Primary osteoarthritis, unspecified site: Secondary | ICD-10-CM | POA: Diagnosis not present

## 2015-09-26 DIAGNOSIS — Z6824 Body mass index (BMI) 24.0-24.9, adult: Secondary | ICD-10-CM | POA: Diagnosis not present

## 2015-09-26 DIAGNOSIS — I10 Essential (primary) hypertension: Secondary | ICD-10-CM | POA: Diagnosis not present

## 2015-09-26 DIAGNOSIS — Z0001 Encounter for general adult medical examination with abnormal findings: Secondary | ICD-10-CM | POA: Diagnosis not present

## 2015-09-26 DIAGNOSIS — Z1389 Encounter for screening for other disorder: Secondary | ICD-10-CM | POA: Diagnosis not present

## 2015-09-26 DIAGNOSIS — N390 Urinary tract infection, site not specified: Secondary | ICD-10-CM | POA: Diagnosis not present

## 2015-10-02 DIAGNOSIS — I739 Peripheral vascular disease, unspecified: Secondary | ICD-10-CM | POA: Diagnosis not present

## 2015-10-02 DIAGNOSIS — B351 Tinea unguium: Secondary | ICD-10-CM | POA: Diagnosis not present

## 2015-12-12 DIAGNOSIS — H2589 Other age-related cataract: Secondary | ICD-10-CM | POA: Diagnosis not present

## 2015-12-21 DIAGNOSIS — B351 Tinea unguium: Secondary | ICD-10-CM | POA: Diagnosis not present

## 2015-12-21 DIAGNOSIS — I739 Peripheral vascular disease, unspecified: Secondary | ICD-10-CM | POA: Diagnosis not present

## 2016-02-05 ENCOUNTER — Other Ambulatory Visit (INDEPENDENT_AMBULATORY_CARE_PROVIDER_SITE_OTHER): Payer: Self-pay | Admitting: Internal Medicine

## 2016-03-14 ENCOUNTER — Emergency Department (HOSPITAL_COMMUNITY)
Admission: EM | Admit: 2016-03-14 | Discharge: 2016-03-14 | Disposition: A | Discharge status: Home / Self Care | Payer: Medicare Other | Attending: Emergency Medicine | Admitting: Emergency Medicine

## 2016-03-14 ENCOUNTER — Encounter (HOSPITAL_COMMUNITY): Payer: Self-pay | Admitting: Emergency Medicine

## 2016-03-14 ENCOUNTER — Emergency Department (HOSPITAL_COMMUNITY): Payer: Medicare Other

## 2016-03-14 DIAGNOSIS — Z8673 Personal history of transient ischemic attack (TIA), and cerebral infarction without residual deficits: Secondary | ICD-10-CM | POA: Insufficient documentation

## 2016-03-14 DIAGNOSIS — W010XXA Fall on same level from slipping, tripping and stumbling without subsequent striking against object, initial encounter: Secondary | ICD-10-CM | POA: Insufficient documentation

## 2016-03-14 DIAGNOSIS — S60511A Abrasion of right hand, initial encounter: Secondary | ICD-10-CM | POA: Diagnosis not present

## 2016-03-14 DIAGNOSIS — S61210A Laceration without foreign body of right index finger without damage to nail, initial encounter: Secondary | ICD-10-CM | POA: Diagnosis not present

## 2016-03-14 DIAGNOSIS — F17200 Nicotine dependence, unspecified, uncomplicated: Secondary | ICD-10-CM | POA: Diagnosis not present

## 2016-03-14 DIAGNOSIS — Z79899 Other long term (current) drug therapy: Secondary | ICD-10-CM | POA: Insufficient documentation

## 2016-03-14 DIAGNOSIS — Y939 Activity, unspecified: Secondary | ICD-10-CM | POA: Insufficient documentation

## 2016-03-14 DIAGNOSIS — S7002XA Contusion of left hip, initial encounter: Secondary | ICD-10-CM

## 2016-03-14 DIAGNOSIS — S79912A Unspecified injury of left hip, initial encounter: Secondary | ICD-10-CM | POA: Diagnosis present

## 2016-03-14 DIAGNOSIS — I10 Essential (primary) hypertension: Secondary | ICD-10-CM | POA: Diagnosis not present

## 2016-03-14 DIAGNOSIS — S70211A Abrasion, right hip, initial encounter: Secondary | ICD-10-CM | POA: Diagnosis not present

## 2016-03-14 DIAGNOSIS — M25552 Pain in left hip: Secondary | ICD-10-CM | POA: Diagnosis not present

## 2016-03-14 DIAGNOSIS — Y999 Unspecified external cause status: Secondary | ICD-10-CM | POA: Diagnosis not present

## 2016-03-14 DIAGNOSIS — S61214A Laceration without foreign body of right ring finger without damage to nail, initial encounter: Secondary | ICD-10-CM | POA: Insufficient documentation

## 2016-03-14 DIAGNOSIS — T07XXXA Unspecified multiple injuries, initial encounter: Secondary | ICD-10-CM

## 2016-03-14 DIAGNOSIS — Z7982 Long term (current) use of aspirin: Secondary | ICD-10-CM | POA: Diagnosis not present

## 2016-03-14 DIAGNOSIS — S70212A Abrasion, left hip, initial encounter: Secondary | ICD-10-CM | POA: Diagnosis not present

## 2016-03-14 DIAGNOSIS — M79641 Pain in right hand: Secondary | ICD-10-CM | POA: Diagnosis not present

## 2016-03-14 DIAGNOSIS — T148XXA Other injury of unspecified body region, initial encounter: Secondary | ICD-10-CM

## 2016-03-14 DIAGNOSIS — Y929 Unspecified place or not applicable: Secondary | ICD-10-CM | POA: Diagnosis not present

## 2016-03-14 DIAGNOSIS — Z23 Encounter for immunization: Secondary | ICD-10-CM | POA: Diagnosis not present

## 2016-03-14 NOTE — ED Notes (Signed)
EDP at bedside  

## 2016-03-14 NOTE — Discharge Instructions (Signed)
Return if any problems. See your Physician for recheck next week  

## 2016-03-14 NOTE — ED Triage Notes (Signed)
Pt reports slipping on grass 2 days ago. Pt c/o pain to RT hand (ring and pinky finger) and LT hip pain. Pt had a hip replacement last year. Pt ambulatory.

## 2016-03-14 NOTE — ED Notes (Signed)
Pt transported to xray 

## 2016-03-14 NOTE — ED Provider Notes (Signed)
[facility] DEPT Provider Note   CSN: [id number] Arrival date & time: 03/14/16  1035     History   Chief Complaint Chief Complaint  Patient presents with  . Fall    HPI [name] is a 70 y.o. female.  The history is provided by the patient. No language interpreter was used.  Fall  This is a new problem. Episode onset: 3 days ago. The problem occurs constantly. The problem has been gradually worsening. Nothing aggravates the symptoms. Nothing relieves the symptoms. She has tried nothing for the symptoms.  Pt reports she fell 3 days ago.  Pt wants to get left hip check because she has a hip replacement.  Pt has been able to walk but it hurts to lift leg.  Pt complains of cuts to her fingers.  She is using neosporin but is concerned that fingers are not healing  Past Medical History:  Diagnosis Date  . Arthritis   . Crohn's disease ([place])   . Degenerative disc disease, lumbar   . Hypertension   . Sciatica of right side   . Stroke ([place])   . Ventral hernia     Patient Active Problem List   Diagnosis Date Noted  . Postoperative anemia due to acute blood loss   . Subcapital fracture of hip ([place]) 04/09/2015  . Hip fracture ([place]) 04/09/2015  . Left displaced femoral neck fracture ([place]) 04/09/2015  . Gastric ulceration   . Upper GI bleed   . UTI (lower urinary tract infection) 02/21/2015  . Trichomonal infection 02/21/2015  . Small bowel obstruction ([place]) 05/14/2013  . Ventral hernia 06/03/2011  . HTN (hypertension) 06/03/2011  . SBO (small bowel obstruction) ([place]) 05/31/2011  . Crohn's disease ([place]) 05/31/2011  . Tobacco abuse 05/31/2011    Past Surgical History:  Procedure Laterality Date  . CAROTID ENDARTERECTOMY     2004  . CHOLECYSTECTOMY     2008  . ESOPHAGOGASTRODUODENOSCOPY N/A 02/24/2015   Procedure: ESOPHAGOGASTRODUODENOSCOPY (EGD);  Surgeon: Daneil Dolin, MD;  [facility];  Service: Endoscopy;  Laterality: N/A;  . HEMICOLECTOMY     right   . HIP ARTHROPLASTY Left 04/09/2015   Procedure: INSERT PARTIAL HIP REPLACEMENT LEFT;  Surgeon: Carole Civil, MD;  Location: [facility];  Service: Orthopedics;  Laterality: Left;  . INCISIONAL HERNIA REPAIR N/A 05/20/2013   Procedure: [name] HERNIORRHAPHY WITH MESH;  Surgeon: Jamesetta So, MD;  Location: [facility];  Service: General;  Laterality: N/A;  . INCISIONAL HERNIA REPAIR N/A 05/19/2014   Procedure: RECURRENT HERNIA REPAIR INCISIONAL;  Surgeon: Jamesetta So, MD;  Location[facility];  Service: General;  Laterality: N/A;  . INSERTION OF MESH N/A 05/20/2013   Procedure: INSERTION OF MESH;  Surgeon: Jamesetta So, MD;  Location[facility];  Service: General;  Laterality: N/A;  . INSERTION OF MESH N/A 05/19/2014   Procedure: INSERTION OF MESH;  Surgeon: Jamesetta So, MD;  Location[facility];  Service: General;  Laterality: N/A;  . TUBAL LIGATION      OB History    No data available       Home Medications    Prior to Admission medications   Medication Sig Start Date End Date Taking? Authorizing Provider  acetaminophen (TYLENOL) 500 MG tablet Take 1 tablet (500 mg total) by mouth every 6 (six) hours as needed for mild pain. 04/12/15   Samuella Cota, MD  APRISO 0.375 g 24 hr capsule TAKE 4 CAPSULES BY MOUTH DAILY. 02/06/16  Butch Penny, NP  aspirin EC 81 MG tablet Take 1 tablet (81 mg total) by mouth daily. Resume in 1 week Patient taking differently: Take 81 mg by mouth daily.  02/25/15   Kathie Dike, MD  diphenhydrAMINE (SOMINEX) 25 MG tablet Take 25 mg by mouth at bedtime as needed for sleep.    Historical Provider, MD  lisinopril (PRINIVIL,ZESTRIL) 20 MG tablet Take 10 mg by mouth daily.    Historical Provider, MD    Family History Family History  Problem Relation Age of Onset  . GI problems Father   . Stroke Brother   . Heart attack Mother   . Hypotension Sister     Social History Social History  Substance Use Topics  . Smoking status: Current Every Day Smoker      Packs/day: 1.00    Years: 30.00  . Smokeless tobacco: Never Used     Comment: 1-2 pack day x 20 yrs.  . Alcohol use No     Comment: one every 2 -3 months.     Allergies   Flagyl [metronidazole hcl] and Other   Review of Systems Review of Systems  Skin: Positive for wound.  All other systems reviewed and are negative.    Physical Exam Updated Vital Signs BP 127/80 (BP Location: Left Arm)   Pulse 74   Temp 97.6 F (36.4 C) (Oral)   Resp 16   Ht 5\' 2"  (1.575 m)   Wt 59 kg   SpO2 98%   BMI 23.78 kg/m   Physical Exam  Constitutional: She appears well-developed and well-nourished. No distress.  HENT:  Head: Normocephalic and atraumatic.  Eyes: Conjunctivae are normal.  Neck: Neck supple.  Cardiovascular: Normal rate and regular rhythm.   No murmur heard. Pulmonary/Chest: Effort normal and breath sounds normal. No respiratory distress.  Abdominal: Soft. There is no tenderness.  Musculoskeletal: She exhibits tenderness. She exhibits no edema or deformity.  Slight tenderness left hip,  From  nv and ns intact  Neurological: She is alert.  Skin: Skin is warm and dry.  Skin tears right 4th and 5th fingers dorsal aspect, from,  nv and ns intact,    Psychiatric: She has a normal mood and affect.  Nursing note and vitals reviewed.    ED Treatments / Results  Labs (all labs ordered are listed, but only abnormal results are displayed) Labs Reviewed - No data to display  EKG  EKG Interpretation None       Radiology Dg Hand Complete Right  Result Date: 03/14/2016 CLINICAL DATA:  Right hand and left hip pain after slipping on grass and falling 2 days ago. Initial encounter. EXAM: RIGHT HAND - COMPLETE 3+ VIEW COMPARISON:  None. FINDINGS: The bones are demineralized. There is no evidence of acute fracture or dislocation. Mild degenerative changes are present, greatest at the second DIP joint. No erosive changes or focal soft tissue abnormalities identified.  IMPRESSION: No acute osseous findings demonstrated. Degenerative changes as described, greatest at the second DIP joint. Electronically Signed   By: Richardean Sale M.D.   On: 03/14/2016 11:18   Dg Hip Unilat W Or Wo Pelvis 2-3 Views Left  Result Date: 03/14/2016 CLINICAL DATA:  Right hand and left hip pain after slipping on grass and falling 2 days ago. Initial encounter. EXAM: DG HIP (WITH OR WITHOUT PELVIS) 2-3V LEFT COMPARISON:  Radiographs 04/09/2015 and 04/09/2015.  CT 07/07/2015. FINDINGS: The bones are demineralized. The patient has undergone left hip hemiarthroplasty. The hardware is  intact without loosening. There is no evidence of acute fracture or dislocation. Ossification superior to the greater trochanter appears nonacute. The bony pelvis appears intact. There are mild degenerative changes in the lower lumbar spine. IMPRESSION: No evidence of acute fracture or dislocation status post left hip hemiarthroplasty. Heterotopic ossification superior to the left femoral greater trochanter. Electronically Signed   By: Richardean Sale M.D.   On: 03/14/2016 11:21    Procedures Procedures (including critical care time)  Medications Ordered in ED Medications - No data to display   Initial Impression / Assessment and Plan / ED Course  I have reviewed the triage vital signs and the nursing notes.  Pertinent labs & imaging results that were available during my care of the patient were reviewed by me and considered in my medical decision making (see chart for details).  Clinical Course    Pt counseled on skin tears and healing.    Final Clinical Impressions(s) / ED Diagnoses   Final diagnoses:  Abrasions of multiple sites  Contusion of left hip, initial encounter  Abrasion    New Prescriptions New Prescriptions   No medications on file  An After Visit Summary was printed and given to the patient.    Hollace Kinnier [place], PA-C 03/14/16 [place], MD 03/15/16 [phone]

## 2016-03-14 NOTE — ED Notes (Signed)
Pt's wounds on RT hand cleaned, non-adhesive dressing placed and covered with Medipore tape. Pt tolerated well.

## 2016-03-31 ENCOUNTER — Telehealth: Payer: Self-pay | Admitting: Orthopedic Surgery

## 2016-03-31 NOTE — Telephone Encounter (Signed)
Cancel it

## 2016-03-31 NOTE — Telephone Encounter (Signed)
Pt stated she fell about 2 wks ago and went to [facility]. They did xrays, checked her out and told her that everything looks good, so she didn't feel like she needed to keep her appointment on 04/01/16.

## 2016-03-31 NOTE — Telephone Encounter (Signed)
Patient canceled her appointment for tomorrow. She stated that she fell a couple of weeks ago on the side she had surgery and went to [facility] (03/14/16). They did X-rays, checked her out and she was told that everything looked good. She stated that she doesn't feel the  need to keep the appointment.

## 2016-04-01 ENCOUNTER — Ambulatory Visit: Payer: Medicare Other | Admitting: Orthopedic Surgery

## 2016-06-25 ENCOUNTER — Encounter (INDEPENDENT_AMBULATORY_CARE_PROVIDER_SITE_OTHER): Payer: Self-pay

## 2016-06-25 ENCOUNTER — Encounter (INDEPENDENT_AMBULATORY_CARE_PROVIDER_SITE_OTHER): Payer: Self-pay | Admitting: Internal Medicine

## 2016-08-06 ENCOUNTER — Other Ambulatory Visit (INDEPENDENT_AMBULATORY_CARE_PROVIDER_SITE_OTHER): Payer: Self-pay | Admitting: Internal Medicine

## 2016-08-20 ENCOUNTER — Encounter (INDEPENDENT_AMBULATORY_CARE_PROVIDER_SITE_OTHER): Payer: Self-pay | Admitting: Internal Medicine

## 2016-08-20 ENCOUNTER — Ambulatory Visit (INDEPENDENT_AMBULATORY_CARE_PROVIDER_SITE_OTHER): Payer: Medicare Other | Admitting: Internal Medicine

## 2016-08-20 VITALS — BP 122/60 | HR 56 | Temp 97.5°F | Ht 62.0 in | Wt 140.4 lb

## 2016-08-20 DIAGNOSIS — K5000 Crohn's disease of small intestine without complications: Secondary | ICD-10-CM

## 2016-08-20 NOTE — Patient Instructions (Signed)
OV in one year. 

## 2016-08-20 NOTE — Progress Notes (Signed)
Subjective:    Patient ID: [name], female    DOB: [birth year], 71 y.o.   MRN: [id number]  HPI Here today for f/u. She was last seen in March of 2017. Hx of small bowel Crohn's.  She tells me she is doing good. There is no abdominal pain. She is having a BM about 4-5 stools a day. Stools are usually loose. No melena or BRRB. Her appetite is good. No weight loss. She has gained about 7 pounds since her last OV.    She is not exercising.  CBC  06/23/2006 Resection of terminal ileum and cecum (ileocestomy)    Component Value Date/Time   WBC 13.0 (H) 07/07/2015 1255   RBC 4.13 07/07/2015 1255   HGB 12.9 07/07/2015 1255   HCT 38.5 07/07/2015 1255   PLT 263 07/07/2015 1255   MCV 93.2 07/07/2015 1255   MCH 31.2 07/07/2015 1255   MCHC 33.5 07/07/2015 1255   RDW 14.4 07/07/2015 1255   LYMPHSABS 1.3 04/13/2015 0735   MONOABS 0.6 04/13/2015 0735   EOSABS 0.2 04/13/2015 0735   BASOSABS 0.0 04/13/2015 0735        Review of Systems Past Medical History:  Diagnosis Date  . Arthritis   . Crohn's disease ([place])   . Degenerative disc disease, lumbar   . Hypertension   . Sciatica of right side   . Stroke ([place])   . Ventral hernia     Past Surgical History:  Procedure Laterality Date  . CAROTID ENDARTERECTOMY     2004  . CHOLECYSTECTOMY     2008  . ESOPHAGOGASTRODUODENOSCOPY N/A 02/24/2015   Procedure: ESOPHAGOGASTRODUODENOSCOPY (EGD);  Surgeon: Daneil Dolin, MD;  [facility];  Service: Endoscopy;  Laterality: N/A;  . HEMICOLECTOMY     right  . HIP ARTHROPLASTY Left 04/09/2015   Procedure: INSERT PARTIAL HIP REPLACEMENT LEFT;  Surgeon: Carole Civil, MD;  [facility];  Service: Orthopedics;  Laterality: Left;  . INCISIONAL HERNIA REPAIR N/A 05/20/2013   Procedure: [name] HERNIORRHAPHY WITH MESH;  Surgeon: Jamesetta So, MD;  Location: [facility];  Service: General;  Laterality: N/A;  . INCISIONAL HERNIA REPAIR N/A 05/19/2014   Procedure: RECURRENT HERNIA  REPAIR INCISIONAL;  Surgeon: Jamesetta So, MD;  Location[facility];  Service: General;  Laterality: N/A;  . INSERTION OF MESH N/A 05/20/2013   Procedure: INSERTION OF MESH;  Surgeon: Jamesetta So, MD;  Location[facility];  Service: General;  Laterality: N/A;  . INSERTION OF MESH N/A 05/19/2014   Procedure: INSERTION OF MESH;  Surgeon: Jamesetta So, MD;  Location[facility];  Service: General;  Laterality: N/A;  . TUBAL LIGATION      Allergies  Allergen Reactions  . Flagyl [Metronidazole Hcl] Other (See Comments)    "makes me feel whoosy and weird"  . Other     Unknown antibiotic and unknown pain medications     Current Outpatient Prescriptions on File Prior to Visit  Medication Sig Dispense Refill  . acetaminophen (TYLENOL) 500 MG tablet Take 1 tablet (500 mg total) by mouth every 6 (six) hours as needed for mild pain.    . APRISO 0.375 g 24 hr capsule TAKE 4 CAPSULES BY MOUTH DAILY. 120 capsule 3  . aspirin EC 81 MG tablet Take 1 tablet (81 mg total) by mouth daily. Resume in 1 week (Patient taking differently: Take 81 mg by mouth daily. )    . diphenhydrAMINE (SOMINEX) 25 MG tablet Take  25 mg by mouth at bedtime as needed for sleep.    Marland Kitchen lisinopril (PRINIVIL,ZESTRIL) 10 MG tablet Take 10 mg by mouth daily.    . ondansetron (ZOFRAN-ODT) 4 MG disintegrating tablet Take 4 mg by mouth every 8 (eight) hours as needed for nausea or vomiting.      No current facility-administered medications on file prior to visit.        Objective:   Physical Exam Blood pressure 122/60, pulse (!) 56, temperature 97.5 F (36.4 C), height 5' 2"  (1.575 m), weight 140 lb 6.4 oz (63.7 kg). Alert and oriented. Skin warm and dry. Oral mucosa is moist.   . Sclera anicteric, conjunctivae is pink. Thyroid not enlarged. No cervical lymphadenopathy. Lungs clear. Heart regular rate and rhythm.  Abdomen is soft. Bowel sounds are positive. No hepatomegaly. No abdominal masses felt. No tenderness.  No edema to lower  extremities.  .       Assessment & Plan:  Crohn's disease. She seems to be doing well. Hemoglobin stable. Will see back in one year. She will let me know when she is ready for a colonoscopy.

## 2016-12-02 ENCOUNTER — Other Ambulatory Visit (INDEPENDENT_AMBULATORY_CARE_PROVIDER_SITE_OTHER): Payer: Self-pay | Admitting: Internal Medicine

## 2016-12-03 ENCOUNTER — Telehealth (INDEPENDENT_AMBULATORY_CARE_PROVIDER_SITE_OTHER): Payer: Self-pay | Admitting: Internal Medicine

## 2016-12-03 NOTE — Telephone Encounter (Signed)
Patient called, left a message stating "Dr. Karna Christmas, you need to fill my prescriptions at the drug store"  [phone]

## 2016-12-08 NOTE — Telephone Encounter (Signed)
rx has been sent 

## 2017-02-10 ENCOUNTER — Emergency Department (HOSPITAL_COMMUNITY)
Admission: EM | Admit: 2017-02-10 | Discharge: 2017-02-10 | Disposition: A | Discharge status: Home / Self Care | Payer: Medicare Other | Attending: Emergency Medicine | Admitting: Emergency Medicine

## 2017-02-10 ENCOUNTER — Encounter (HOSPITAL_COMMUNITY): Payer: Self-pay | Admitting: *Deleted

## 2017-02-10 ENCOUNTER — Emergency Department (HOSPITAL_COMMUNITY): Payer: Medicare Other

## 2017-02-10 DIAGNOSIS — Z7982 Long term (current) use of aspirin: Secondary | ICD-10-CM | POA: Insufficient documentation

## 2017-02-10 DIAGNOSIS — I10 Essential (primary) hypertension: Secondary | ICD-10-CM | POA: Diagnosis not present

## 2017-02-10 DIAGNOSIS — M1611 Unilateral primary osteoarthritis, right hip: Secondary | ICD-10-CM | POA: Insufficient documentation

## 2017-02-10 DIAGNOSIS — Z79899 Other long term (current) drug therapy: Secondary | ICD-10-CM | POA: Insufficient documentation

## 2017-02-10 DIAGNOSIS — M25551 Pain in right hip: Secondary | ICD-10-CM | POA: Diagnosis not present

## 2017-02-10 DIAGNOSIS — M5441 Lumbago with sciatica, right side: Secondary | ICD-10-CM | POA: Diagnosis not present

## 2017-02-10 DIAGNOSIS — F17200 Nicotine dependence, unspecified, uncomplicated: Secondary | ICD-10-CM | POA: Insufficient documentation

## 2017-02-10 DIAGNOSIS — M5431 Sciatica, right side: Secondary | ICD-10-CM | POA: Diagnosis not present

## 2017-02-10 DIAGNOSIS — Z96642 Presence of left artificial hip joint: Secondary | ICD-10-CM | POA: Insufficient documentation

## 2017-02-10 MED ORDER — TRAMADOL HCL 50 MG PO TABS: 50.0000 mg | ORAL_TABLET | Freq: Three times a day (TID) | ORAL | 0 refills | Status: DC | PRN

## 2017-02-10 MED ORDER — ACETAMINOPHEN 325 MG PO TABS
650.0000 mg | ORAL_TABLET | Freq: Once | ORAL | Status: AC
  Administered 2017-02-10: 650 mg via ORAL
  Filled 2017-02-10: qty 2

## 2017-02-10 NOTE — ED Provider Notes (Signed)
[facility] DEPT Provider Note   CSN: [id number] Arrival date & time: 02/10/17  2029     History   Chief Complaint Chief Complaint  Patient presents with  . Hip Pain    HPI [name] is a 71 y.o. female.  HPI Patient presents with 2 weeks of right hip pain. States the pain radiates from her hip down the back of her leg to her knee. Denies any numbness or weakness. Denies incontinence. No fever or chills. No recent falls. Patient is having no pain in her left hip. Patient states she is taking Tylenol for the pain. Past Medical History:  Diagnosis Date  . Arthritis   . Crohn's disease ([place])   . Degenerative disc disease, lumbar   . Hypertension   . Sciatica of right side   . Stroke ([place])   . Ventral hernia     Patient Active Problem List   Diagnosis Date Noted  . Postoperative anemia due to acute blood loss   . Subcapital fracture of hip ([place]) 04/09/2015  . Hip fracture ([place]) 04/09/2015  . Left displaced femoral neck fracture ([place]) 04/09/2015  . Gastric ulceration   . Upper GI bleed   . UTI (lower urinary tract infection) 02/21/2015  . Trichomonal infection 02/21/2015  . Small bowel obstruction ([place]) 05/14/2013  . Ventral hernia 06/03/2011  . HTN (hypertension) 06/03/2011  . SBO (small bowel obstruction) ([place]) 05/31/2011  . Crohn's disease ([place]) 05/31/2011  . Tobacco abuse 05/31/2011    Past Surgical History:  Procedure Laterality Date  . CAROTID ENDARTERECTOMY     2004  . CHOLECYSTECTOMY     2008  . ESOPHAGOGASTRODUODENOSCOPY N/A 02/24/2015   Procedure: ESOPHAGOGASTRODUODENOSCOPY (EGD);  Surgeon: Daneil Dolin, MD;  [facility];  Service: Endoscopy;  Laterality: N/A;  . HEMICOLECTOMY     right  . HIP ARTHROPLASTY Left 04/09/2015   Procedure: INSERT PARTIAL HIP REPLACEMENT LEFT;  Surgeon: Carole Civil, MD;  Location: [facility] ORS;  Service: Orthopedics;  Laterality: Left;  . INCISIONAL HERNIA REPAIR N/A 05/20/2013   Procedure: [name]  HERNIORRHAPHY WITH MESH;  Surgeon: Jamesetta So, MD;  Location: [facility] ORS;  Service: General;  Laterality: N/A;  . INCISIONAL HERNIA REPAIR N/A 05/19/2014   Procedure: RECURRENT HERNIA REPAIR INCISIONAL;  Surgeon: Jamesetta So, MD;  Location: [facility];  Service: General;  Laterality: N/A;  . INSERTION OF MESH N/A 05/20/2013   Procedure: INSERTION OF MESH;  Surgeon: Jamesetta So, MD;  Location: [facility] ORS;  Service: General;  Laterality: N/A;  . INSERTION OF MESH N/A 05/19/2014   Procedure: INSERTION OF MESH;  Surgeon: Jamesetta So, MD;  Location: [facility] ORS;  Service: General;  Laterality: N/A;  . TUBAL LIGATION      OB History    No data available       Home Medications    Prior to Admission medications   Medication Sig Start Date End Date Taking? Authorizing Provider  acetaminophen (TYLENOL) 500 MG tablet Take 1 tablet (500 mg total) by mouth every 6 (six) hours as needed for mild pain. 04/12/15   Samuella Cota, MD  APRISO 0.375 g 24 hr capsule TAKE 4 CAPSULES BY MOUTH DAILY. 12/03/16   Setzer, Rona Ravens, NP  aspirin EC 81 MG tablet Take 1 tablet (81 mg total) by mouth daily. Resume in 1 week Patient taking differently: Take 81 mg by mouth daily.  02/25/15   Kathie Dike, MD  diphenhydrAMINE (SOMINEX) 25 MG tablet Take  25 mg by mouth at bedtime as needed for sleep.    [provider]  lisinopril (PRINIVIL,ZESTRIL) 10 MG tablet Take 10 mg by mouth daily. 02/19/16   [provider]  ondansetron (ZOFRAN-ODT) 4 MG disintegrating tablet Take 4 mg by mouth every 8 (eight) hours as needed for nausea or vomiting.  02/19/16   [provider]  traMADol (ULTRAM) 50 MG tablet Take 1 tablet (50 mg total) by mouth every 8 (eight) hours as needed for severe pain. 02/10/17   Julianne Rice, MD    Family History Family History  Problem Relation Age of Onset  . GI problems Father   . Stroke Brother   . Heart attack Mother   . Hypotension Sister     Social History Social  History  Substance Use Topics  . Smoking status: Current Every Day Smoker    Packs/day: 1.00    Years: 30.00  . Smokeless tobacco: Never Used     Comment: 1-2 pack day x 20 yrs.  . Alcohol use No     Comment: one every 2 -3 months.     Allergies   Flagyl [metronidazole hcl] and Other   Review of Systems Review of Systems  Constitutional: Negative for chills and fever.  Respiratory: Negative for shortness of breath.   Cardiovascular: Negative for chest pain.  Gastrointestinal: Negative for abdominal pain, nausea and vomiting.  Genitourinary: Negative for difficulty urinating, dysuria and flank pain.  Musculoskeletal: Positive for arthralgias and back pain. Negative for neck pain and neck stiffness.  Neurological: Negative for dizziness, weakness, light-headedness, numbness and headaches.  All other systems reviewed and are negative.    Physical Exam Updated Vital Signs BP 128/70   Pulse 84   Temp 98.4 F (36.9 C) (Oral)   Resp 20   Ht 5\' 2"  (1.575 m)   Wt 63.5 kg (140 lb)   SpO2 99%   BMI 25.61 kg/m   Physical Exam  Constitutional: She is oriented to person, place, and time. She appears well-developed and well-nourished. No distress.  HENT:  Head: Normocephalic and atraumatic.  Mouth/Throat: Oropharynx is clear and moist.  Eyes: Pupils are equal, round, and reactive to light. EOM are normal.  Neck: Normal range of motion. Neck supple.  Cardiovascular: Normal rate and regular rhythm.   Pulmonary/Chest: Effort normal and breath sounds normal.  Abdominal: Soft. Bowel sounds are normal. There is no tenderness. There is no rebound and no guarding.  Musculoskeletal: Normal range of motion. She exhibits no edema or tenderness.  Patient has midline inferior lumbar tenderness to palpation. Negative straight leg raise bilaterally. Full range of motion of the right hip and right knee. Distal pulses are 2+. No lower extremity swelling, asymmetry or tenderness.  Neurological:  She is alert and oriented to person, place, and time.  5/5 motor in bilateral lower extremities. Patient is able to ambulate without assistance. No numbness including saddle anesthesia.  Skin: Skin is warm and dry. Capillary refill takes less than 2 seconds. No rash noted. No erythema.  Psychiatric: She has a normal mood and affect. Her behavior is normal.  Nursing note and vitals reviewed.    ED Treatments / Results  Labs (all labs ordered are listed, but only abnormal results are displayed) Labs Reviewed - No data to display  EKG  EKG Interpretation None       Radiology Dg Hip Unilat  With Pelvis 2-3 Views Right  Result Date: 02/10/2017 CLINICAL DATA:  71 y/o F; 2 weeks  of right hip pain. No known injury. EXAM: DG HIP (WITH OR WITHOUT PELVIS) 2-3V RIGHT COMPARISON:  03/14/2016 pelvis and left hip radiographs. 04/09/2015 pelvis radiograph. FINDINGS: Left total hip prosthesis. The acetabular component demonstrates progressive coronal clockwise rotation comparison with the prior radiograph radiographs. Heterotopic ossification superior to greater trochanter. No acute fracture or dislocation. Mild right hip osteoarthrosis is stable with acetabular osteophytes. IMPRESSION: 1. No acute fracture or dislocation. 2. Stable mild right hip osteoarthrosis. 3. Total left hip prosthesis with progressive coronal clockwise rotation of the acetabular component from 2016 which may represent loosening. Electronically Signed   By: Kristine Garbe M.D.   On: 02/10/2017 21:37    Procedures Procedures (including critical care time)  Medications Ordered in ED Medications - No data to display   Initial Impression / Assessment and Plan / ED Course  I have reviewed the triage vital signs and the nursing notes.  Pertinent labs & imaging results that were available during my care of the patient were reviewed by me and considered in my medical decision making (see chart for details).     Mild  osteoarthritis of the right tip on x-ray. Questionable rotation of hardware in the left hip. Patient is asymptomatic in the left hip. Right hip pain appears to be possibly combination of sciatica and arthritis.no red flag signs or symptoms. We'll treat symptomatically and have follow-up with Dr. Aline Brochure.  Final Clinical Impressions(s) / ED Diagnoses   Final diagnoses:  Osteoarthritis of right hip, unspecified osteoarthritis type  Sciatica of right side    New Prescriptions New Prescriptions   TRAMADOL (ULTRAM) 50 MG TABLET    Take 1 tablet (50 mg total) by mouth every 8 (eight) hours as needed for severe pain.     Julianne Rice, MD 02/10/17 [phone]

## 2017-02-10 NOTE — ED Triage Notes (Signed)
Pt c/o right hip pain for the past two week that hurts with weight bearing, denies any injury, pt was ambulatory to ems stretcher,

## 2017-02-23 ENCOUNTER — Ambulatory Visit (INDEPENDENT_AMBULATORY_CARE_PROVIDER_SITE_OTHER): Payer: Medicare Other | Admitting: Orthopedic Surgery

## 2017-02-23 ENCOUNTER — Encounter: Payer: Self-pay | Admitting: Orthopedic Surgery

## 2017-02-23 VITALS — BP 144/88 | HR 90 | Wt 136.0 lb

## 2017-02-23 DIAGNOSIS — M5137 Other intervertebral disc degeneration, lumbosacral region: Secondary | ICD-10-CM

## 2017-02-23 MED ORDER — NAPROXEN 500 MG PO TABS: 500.0000 mg | ORAL_TABLET | Freq: Two times a day (BID) | ORAL | 2 refills | Status: DC

## 2017-02-23 NOTE — Progress Notes (Signed)
Patient ID: [name], female   DOB: [birth year], 71 y.o.   MRN: [id number]  Chief Complaint  Patient presents with  . Hip Pain    right    HPI [name] is a 71 y.o. female.  71 year old female presents with right leg pain evaluated in the ER for right hip pain  She reports that she was sleeping on the couch and then switch to the bed and then after a week or so that started having right leg pain starts in her lower back radiates down her right leg into her right knee and into her right groin she denies any trauma it's a dull ache and it is constant  Review of Systems Review of Systems  Musculoskeletal: Positive for back pain, gait problem and myalgias.  Neurological: Positive for weakness. Negative for numbness.   (2 MINIMUM)  Past Medical History:  Diagnosis Date  . Arthritis   . Crohn's disease ([place])   . Degenerative disc disease, lumbar   . Hypertension   . Sciatica of right side   . Stroke ([place])   . Ventral hernia     Past Surgical History:  Procedure Laterality Date  . CAROTID ENDARTERECTOMY     2004  . CHOLECYSTECTOMY     2008  . ESOPHAGOGASTRODUODENOSCOPY N/A 02/24/2015   Procedure: ESOPHAGOGASTRODUODENOSCOPY (EGD);  Surgeon: Daneil Dolin, MD;  [facility];  Service: Endoscopy;  Laterality: N/A;  . HEMICOLECTOMY     right  . HIP ARTHROPLASTY Left 04/09/2015   Procedure: INSERT PARTIAL HIP REPLACEMENT LEFT;  Surgeon: Carole Civil, MD;  Location: [facility];  Service: Orthopedics;  Laterality: Left;  . INCISIONAL HERNIA REPAIR N/A 05/20/2013   Procedure: [name] HERNIORRHAPHY WITH MESH;  Surgeon: Jamesetta So, MD;  Location: [facility];  Service: General;  Laterality: N/A;  . INCISIONAL HERNIA REPAIR N/A 05/19/2014   Procedure: RECURRENT HERNIA REPAIR INCISIONAL;  Surgeon: Jamesetta So, MD;  Location: [facility];  Service: General;  Laterality: N/A;  . INSERTION OF MESH N/A 05/20/2013   Procedure: INSERTION OF MESH;  Surgeon: Jamesetta So, MD;   Location: [facility];  Service: General;  Laterality: N/A;  . INSERTION OF MESH N/A 05/19/2014   Procedure: INSERTION OF MESH;  Surgeon: Jamesetta So, MD;  Location: [facility];  Service: General;  Laterality: N/A;  . TUBAL LIGATION      Social History Social History  Substance Use Topics  . Smoking status: Current Every Day Smoker    Packs/day: 1.00    Years: 30.00  . Smokeless tobacco: Never Used     Comment: 1-2 pack day x 20 yrs.  . Alcohol use No     Comment: one every 2 -3 months.    Allergies  Allergen Reactions  . Flagyl [Metronidazole Hcl] Other (See Comments)    "makes me feel whoosy and weird"  . Other     Unknown antibiotic and unknown pain medications     Current Meds  Medication Sig  . acetaminophen (TYLENOL) 500 MG tablet Take 1 tablet (500 mg total) by mouth every 6 (six) hours as needed for mild pain.  . APRISO 0.375 g 24 hr capsule TAKE 4 CAPSULES BY MOUTH DAILY.  Marland Kitchen aspirin EC 81 MG tablet Take 1 tablet (81 mg total) by mouth daily. Resume in 1 week (Patient taking differently: Take 81 mg by mouth daily. )  . diphenhydrAMINE (SOMINEX) 25 MG tablet Take 25 mg by mouth at bedtime as  needed for sleep.  Marland Kitchen lisinopril (PRINIVIL,ZESTRIL) 10 MG tablet Take 10 mg by mouth daily.  . ondansetron (ZOFRAN-ODT) 4 MG disintegrating tablet Take 4 mg by mouth every 8 (eight) hours as needed for nausea or vomiting.   . traMADol (ULTRAM) 50 MG tablet Take 1 tablet (50 mg total) by mouth every 8 (eight) hours as needed for severe pain.      Physical Exam Physical Exam 1.BP (!) 144/88   Pulse 90   Wt 136 lb (61.7 kg)   BMI 24.87 kg/m   2. Gen. appearance. The patient is well-developed and well-nourished, grooming and hygiene are normal. There are no gross congenital abnormalities  3. The patient is alert and oriented to person place and time  4. Mood and affect are normal  5. Ambulation Is marked by support with a cane  Examination reveals the following: 6. On  inspection we find normal range of motion in the right hip no tenderness tenderness is noted in the lower back and right buttock  7. With the range of motion of  right hip flexion extension normal rotation normal  8. Stability tests were normal  normal in all planes  9. Strength tests revealed grade 5 motor strength  10. Skin we find no rash ulceration or erythema  11. Sensation remains intact  12 Vascular system shows no peripheral edema    MEDICAL DECISION MAKING:    Data Reviewed X-ray right hip shows a normal left hip bipolar, this is not a total hip is read in the ER report  Normal right hip without arthritis  Assessment Encounter Diagnosis  Name Primary?  . DDD (degenerative disc disease), lumbosacral Yes   Recommend physical therapy at home And No orders of the defined types were placed in this encounter.    Plan As above  Arther Abbott 02/23/2017, 3:12 PM

## 2017-02-23 NOTE — Addendum Note (Signed)
Addended by: Baldomero Lamy B on: 02/23/2017 03:23 PM   Modules accepted: Orders

## 2017-02-26 DIAGNOSIS — M5137 Other intervertebral disc degeneration, lumbosacral region: Secondary | ICD-10-CM | POA: Diagnosis not present

## 2017-02-26 DIAGNOSIS — K5090 Crohn's disease, unspecified, without complications: Secondary | ICD-10-CM | POA: Diagnosis not present

## 2017-02-26 DIAGNOSIS — M5431 Sciatica, right side: Secondary | ICD-10-CM | POA: Diagnosis not present

## 2017-02-26 DIAGNOSIS — F17210 Nicotine dependence, cigarettes, uncomplicated: Secondary | ICD-10-CM | POA: Diagnosis not present

## 2017-02-26 DIAGNOSIS — I10 Essential (primary) hypertension: Secondary | ICD-10-CM | POA: Diagnosis not present

## 2017-02-26 DIAGNOSIS — Z96642 Presence of left artificial hip joint: Secondary | ICD-10-CM | POA: Diagnosis not present

## 2017-02-26 DIAGNOSIS — M1990 Unspecified osteoarthritis, unspecified site: Secondary | ICD-10-CM | POA: Diagnosis not present

## 2017-02-26 DIAGNOSIS — Z9181 History of falling: Secondary | ICD-10-CM | POA: Diagnosis not present

## 2017-02-26 DIAGNOSIS — Z8673 Personal history of transient ischemic attack (TIA), and cerebral infarction without residual deficits: Secondary | ICD-10-CM | POA: Diagnosis not present

## 2017-03-02 DIAGNOSIS — K5090 Crohn's disease, unspecified, without complications: Secondary | ICD-10-CM | POA: Diagnosis not present

## 2017-03-02 DIAGNOSIS — F17210 Nicotine dependence, cigarettes, uncomplicated: Secondary | ICD-10-CM | POA: Diagnosis not present

## 2017-03-02 DIAGNOSIS — M5431 Sciatica, right side: Secondary | ICD-10-CM | POA: Diagnosis not present

## 2017-03-02 DIAGNOSIS — M5137 Other intervertebral disc degeneration, lumbosacral region: Secondary | ICD-10-CM | POA: Diagnosis not present

## 2017-03-02 DIAGNOSIS — M1990 Unspecified osteoarthritis, unspecified site: Secondary | ICD-10-CM | POA: Diagnosis not present

## 2017-03-02 DIAGNOSIS — I10 Essential (primary) hypertension: Secondary | ICD-10-CM | POA: Diagnosis not present

## 2017-03-03 DIAGNOSIS — F17210 Nicotine dependence, cigarettes, uncomplicated: Secondary | ICD-10-CM | POA: Diagnosis not present

## 2017-03-03 DIAGNOSIS — I10 Essential (primary) hypertension: Secondary | ICD-10-CM | POA: Diagnosis not present

## 2017-03-03 DIAGNOSIS — M5137 Other intervertebral disc degeneration, lumbosacral region: Secondary | ICD-10-CM | POA: Diagnosis not present

## 2017-03-03 DIAGNOSIS — M5431 Sciatica, right side: Secondary | ICD-10-CM | POA: Diagnosis not present

## 2017-03-03 DIAGNOSIS — M1990 Unspecified osteoarthritis, unspecified site: Secondary | ICD-10-CM | POA: Diagnosis not present

## 2017-03-03 DIAGNOSIS — K5090 Crohn's disease, unspecified, without complications: Secondary | ICD-10-CM | POA: Diagnosis not present

## 2017-03-05 DIAGNOSIS — M5431 Sciatica, right side: Secondary | ICD-10-CM | POA: Diagnosis not present

## 2017-03-05 DIAGNOSIS — K5090 Crohn's disease, unspecified, without complications: Secondary | ICD-10-CM | POA: Diagnosis not present

## 2017-03-05 DIAGNOSIS — M5137 Other intervertebral disc degeneration, lumbosacral region: Secondary | ICD-10-CM | POA: Diagnosis not present

## 2017-03-05 DIAGNOSIS — I10 Essential (primary) hypertension: Secondary | ICD-10-CM | POA: Diagnosis not present

## 2017-03-05 DIAGNOSIS — F17210 Nicotine dependence, cigarettes, uncomplicated: Secondary | ICD-10-CM | POA: Diagnosis not present

## 2017-03-05 DIAGNOSIS — M1990 Unspecified osteoarthritis, unspecified site: Secondary | ICD-10-CM | POA: Diagnosis not present

## 2017-03-10 DIAGNOSIS — M1990 Unspecified osteoarthritis, unspecified site: Secondary | ICD-10-CM | POA: Diagnosis not present

## 2017-03-10 DIAGNOSIS — M5137 Other intervertebral disc degeneration, lumbosacral region: Secondary | ICD-10-CM | POA: Diagnosis not present

## 2017-03-10 DIAGNOSIS — F17210 Nicotine dependence, cigarettes, uncomplicated: Secondary | ICD-10-CM | POA: Diagnosis not present

## 2017-03-10 DIAGNOSIS — K5090 Crohn's disease, unspecified, without complications: Secondary | ICD-10-CM | POA: Diagnosis not present

## 2017-03-10 DIAGNOSIS — I10 Essential (primary) hypertension: Secondary | ICD-10-CM | POA: Diagnosis not present

## 2017-03-10 DIAGNOSIS — M5431 Sciatica, right side: Secondary | ICD-10-CM | POA: Diagnosis not present

## 2017-03-11 DIAGNOSIS — M1990 Unspecified osteoarthritis, unspecified site: Secondary | ICD-10-CM | POA: Diagnosis not present

## 2017-03-11 DIAGNOSIS — K5090 Crohn's disease, unspecified, without complications: Secondary | ICD-10-CM | POA: Diagnosis not present

## 2017-03-11 DIAGNOSIS — M5431 Sciatica, right side: Secondary | ICD-10-CM | POA: Diagnosis not present

## 2017-03-11 DIAGNOSIS — I10 Essential (primary) hypertension: Secondary | ICD-10-CM | POA: Diagnosis not present

## 2017-03-11 DIAGNOSIS — M5137 Other intervertebral disc degeneration, lumbosacral region: Secondary | ICD-10-CM | POA: Diagnosis not present

## 2017-03-11 DIAGNOSIS — F17210 Nicotine dependence, cigarettes, uncomplicated: Secondary | ICD-10-CM | POA: Diagnosis not present

## 2017-03-16 IMAGING — CT CT ABD-PELV W/ CM
2 of 5 series · 15 of 46 positions shown, 17 images · IV contrast (omnipaque)
Comparison: 04/13/2014

CLINICAL DATA: Left upper quadrant and flank pain with nausea for 2
days. History of Crohn's disease.

EXAM:
CT ABDOMEN AND PELVIS WITH CONTRAST
TECHNIQUE: Multidetector CT imaging of the abdomen and pelvis was performed
using the standard protocol following bolus administration of
intravenous contrast.
CONTRAST:  50mL OMNIPAQUE IOHEXOL 300 MG/ML SOLN, 100mL OMNIPAQUE
IOHEXOL 300 MG/ML SOLN

[Series 2: abd_pel_with 5.0 b40f · axial · 0.67mm/px · z∈[-426,-66]mm · 12 of 82 slices shown, 14 images]
[im 5/82  soft-tissue]
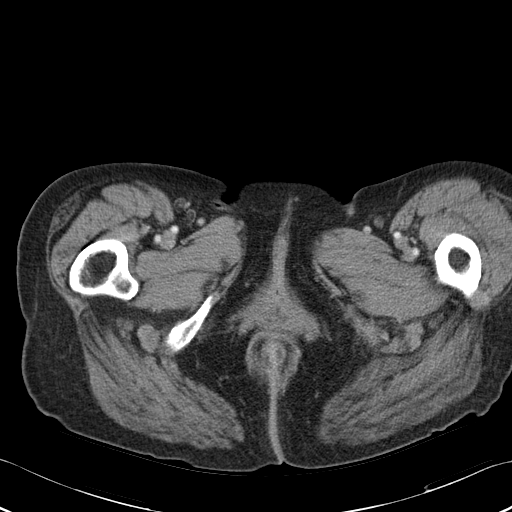
[im 5/82  bone]
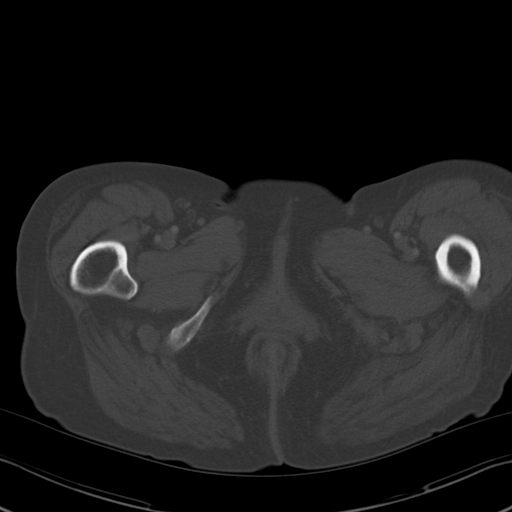
[im 15/82  soft-tissue]
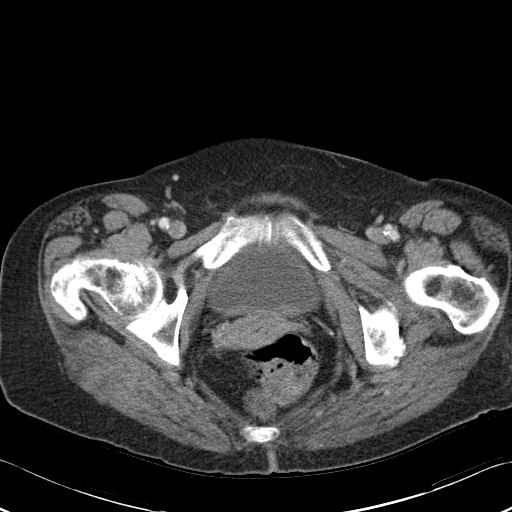
[im 20/82  soft-tissue]
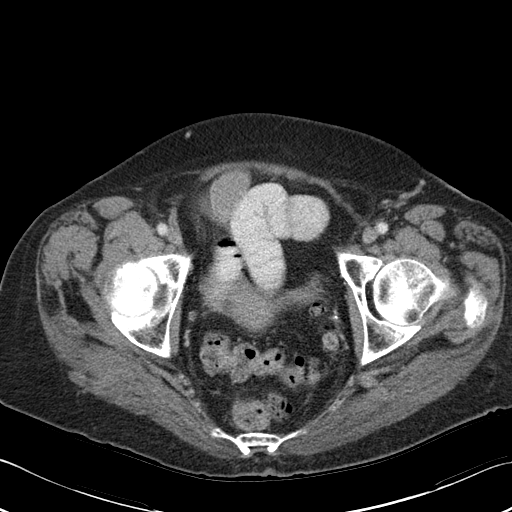
[im 24/82  soft-tissue]
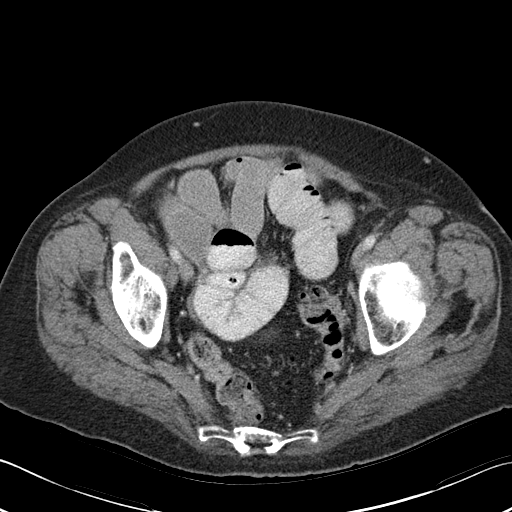
[im 34/82  soft-tissue]
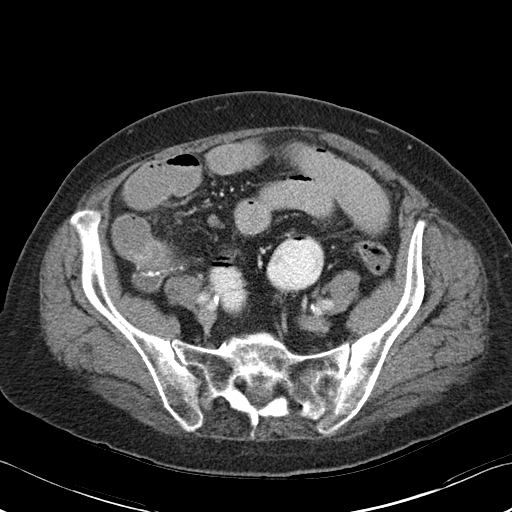
[im 39/82  soft-tissue]
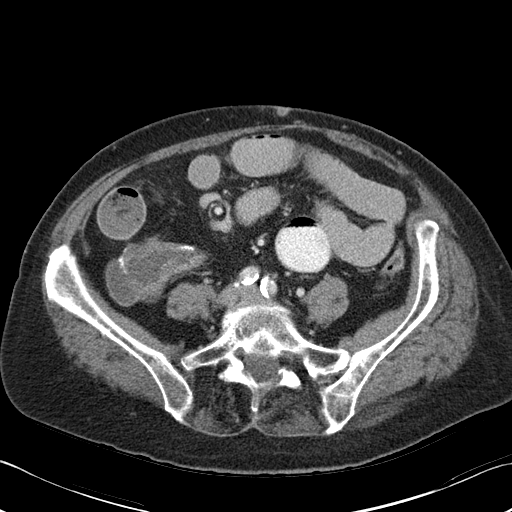
[im 43/82  soft-tissue]
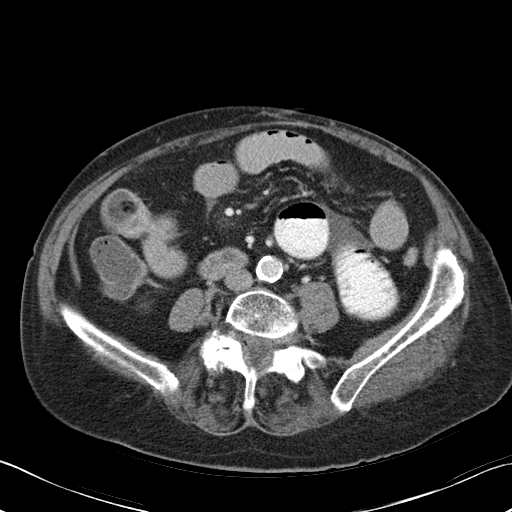
[im 53/82  soft-tissue]
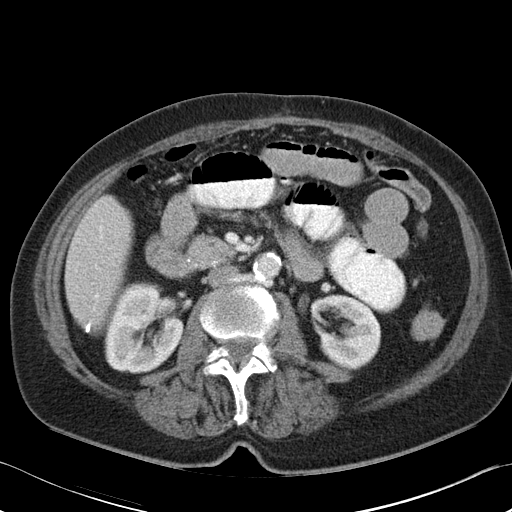
[im 58/82  soft-tissue]
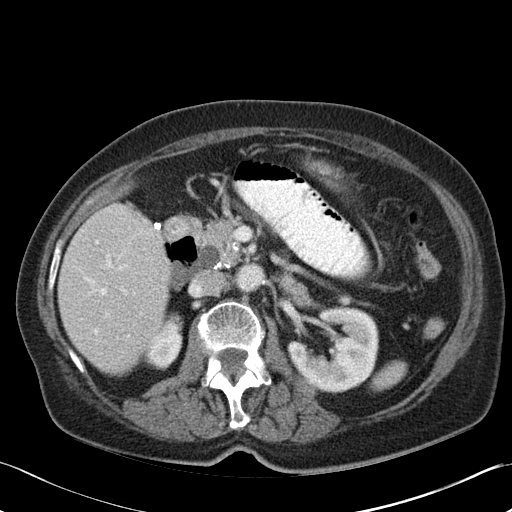
[im 58/82  bone]
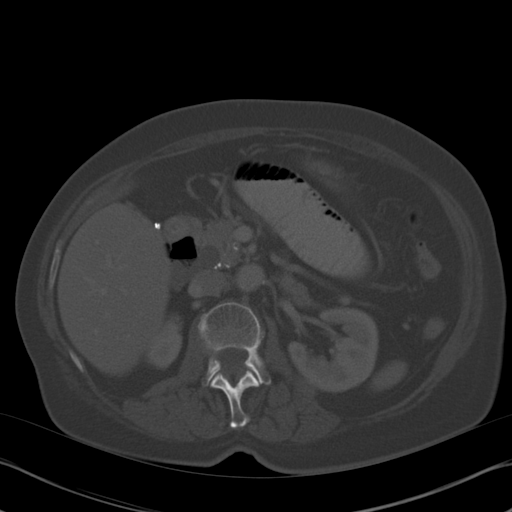
[im 62/82  soft-tissue]
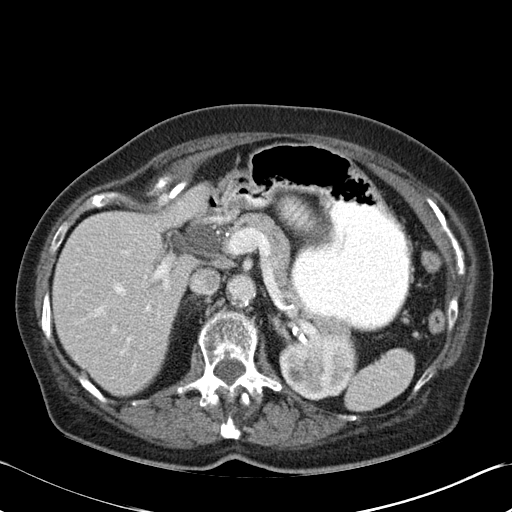
[im 72/82  soft-tissue]
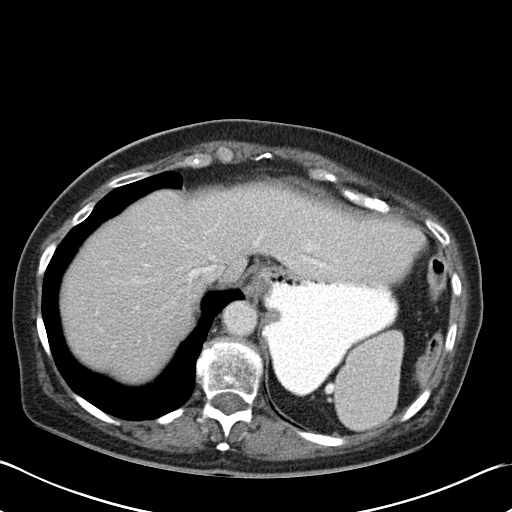
[im 77/82  soft-tissue]
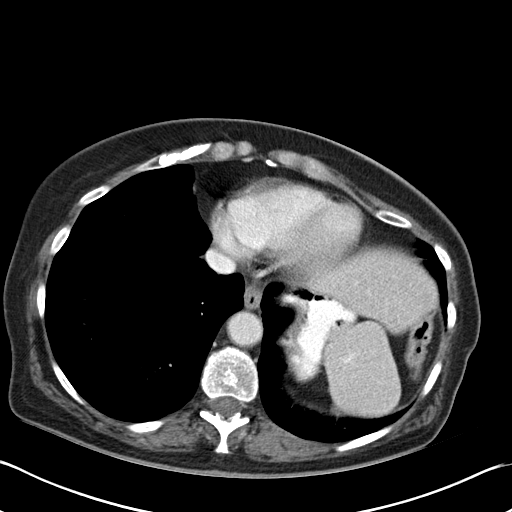

[Series 3: abd_pel_with 3.0 spo · coronal · 0.71mm/px · 3 of 92 slices shown]
[im 31/92  soft-tissue]
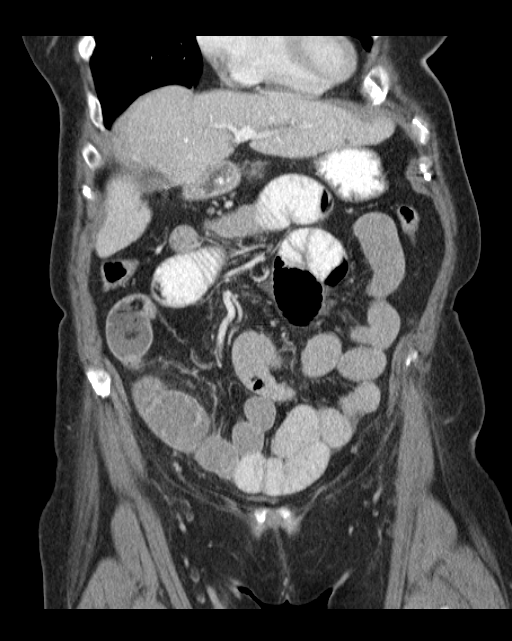
[im 41/92  soft-tissue]
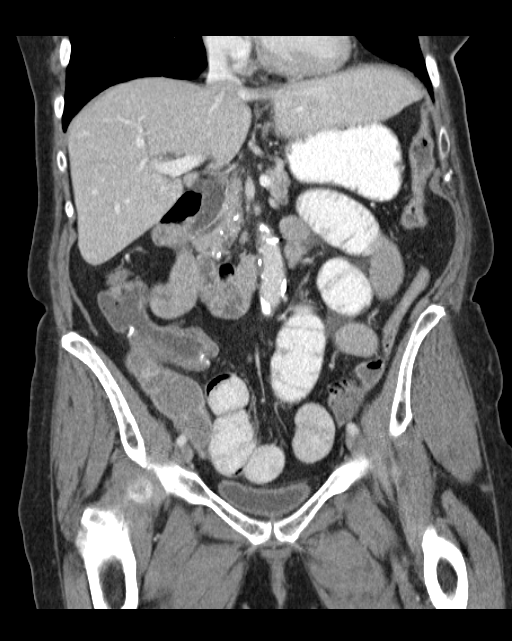
[im 51/92  soft-tissue]
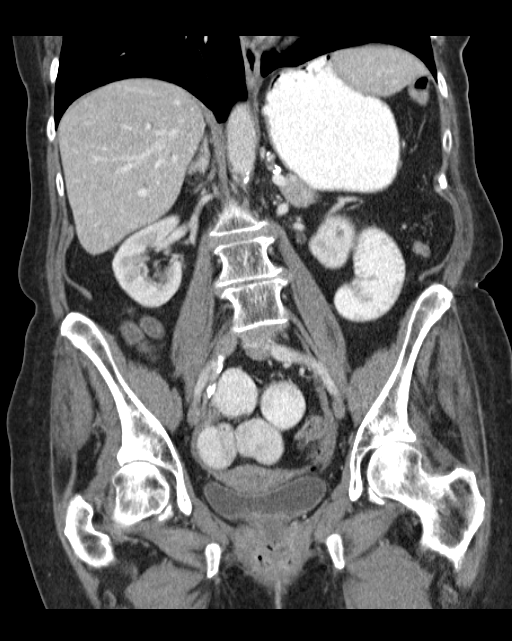

[15 of 46 positions shown; findings below may reference images not displayed]

FINDINGS: Lower chest:  Lung bases are clear.

Hepatobiliary: 3 mm too small to characterize lateral segment left
hepatic lobe hypodensity image 16 stable. Probable focal fat, medial
segment left hepatic lobe. Mild prominence of the intrahepatic bile
ducts is identified. Stable degree of extrahepatic common duct
dilatation measuring 1.4 cm at its midportion with evidence of
cholecystectomy. Tapering to the ampulla is identified.

Pancreas: Pancreatic calcification is noted likely indicating prior
pancreatitis. No peripancreatic fluid or hypo enhancement or mass is
identified.

Spleen: Normal

Adrenals/Urinary Tract: Adrenal glands are normal in appearance.
Bilateral renal cortical cysts and too small to characterize
hypodense lesions are reidentified. An area of focal cortical
scarring or motion artifact noted image 32.

Stomach/Bowel: Small hiatal hernia. There is dilatation of multiple
loops of small bowel beginning at the jejunum and extending to a
focal transition point in the ileum at the level of ileal colic
anastomosis, image 42 also see image 32 series 3 and sagittal image
33 series 4. Trace stranding is identified in the right lower
quadrant around loops of small bowel. Maximal small bowel diameter
is 3.8 cm. No bowel wall thickening with the exception of a short
segment of the right colon, image 40. Distal colonic tics are
identified without evidence for diverticulitis.

Vascular/Lymphatic: Moderate atheromatous aortic calcification
without aneurysm. No lymphadenopathy.

Other: Uterus and ovaries appear normal. Small pelvic free fluid.
The bladder is normal. Evidence of ventral abdominal wall hernia
repair. No complicating features identified.

Musculoskeletal: No acute osseous abnormality. Lumbar spine disc
degenerative change and mild curvature centered at L2 noted.
IMPRESSION: Small bowel obstruction with focal transition point at the level of
the terminal ileum just prior to the ileocolic anastomosis. This may
indicate stricture. Mass is considered less likely.

Stable degree of common bile duct dilatation status post
cholecystectomy.

Interval ventral abdominal wall hernia repair without complicating
feature currently.

## 2017-03-16 IMAGING — CR DG CHEST 1V PORT
1 series · 2 of 2 positions shown · non-contrast
Comparison: PA and lateral chest earlier today.

CLINICAL DATA: NG tube placement.

EXAM:
PORTABLE CHEST - 1 VIEW

[Series 1: ap portable · 0.17mm/px · 2 of 2 slices shown]
[im 1/2]
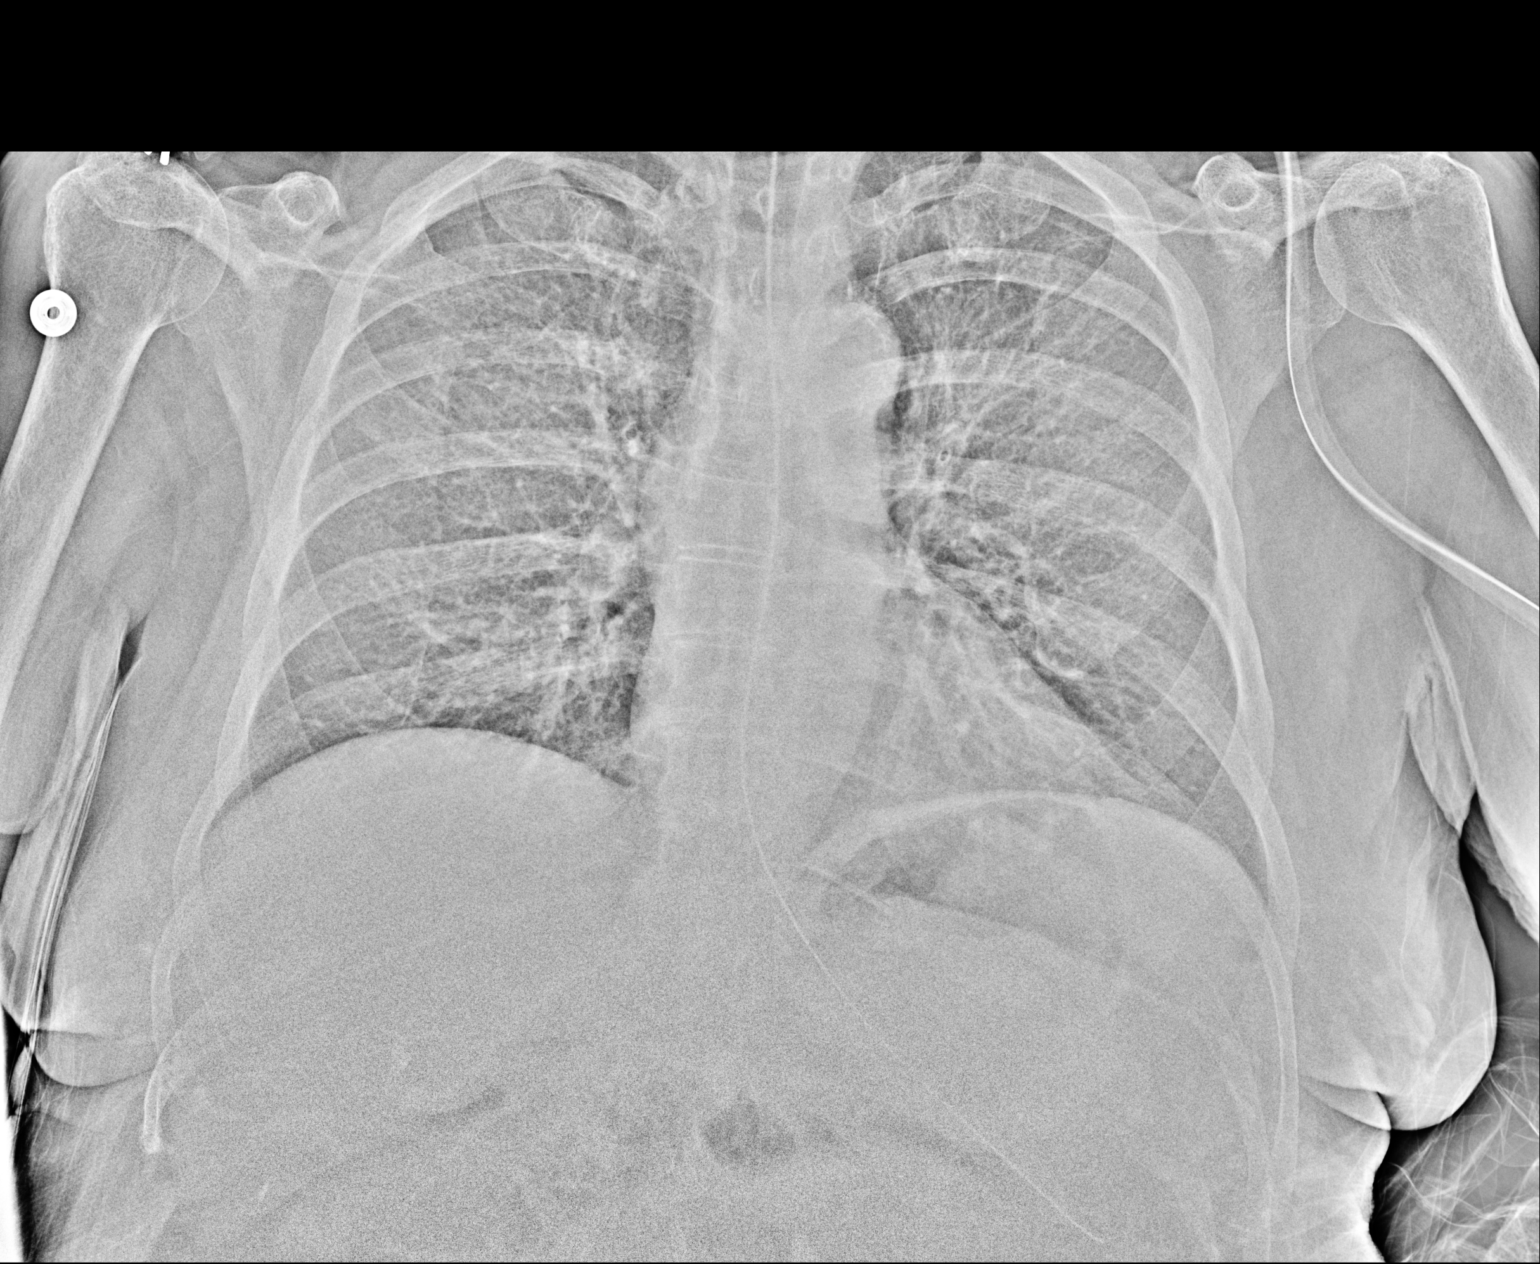
[im 2/2]
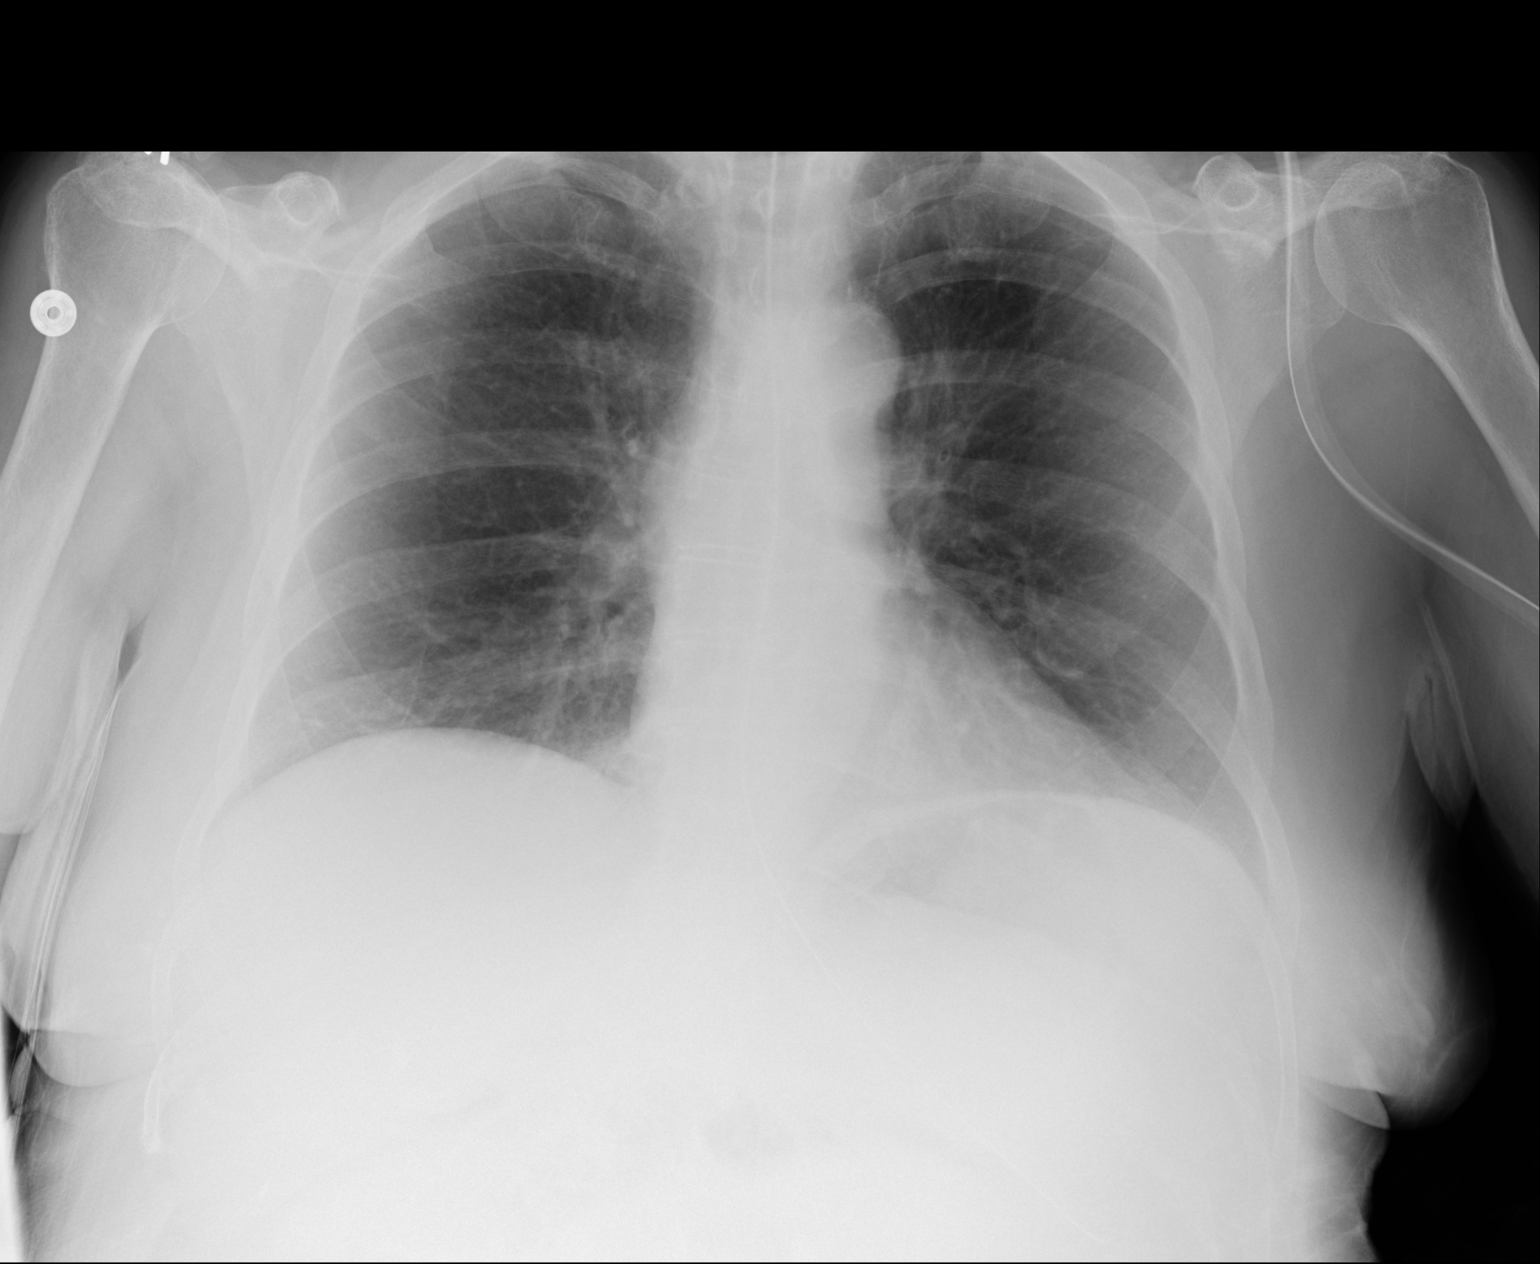

[2 of 2 positions shown; findings below may reference images not displayed]

FINDINGS: NG tube is in place with the side port and tip in the stomach. The
lungs are clear. Heart size is normal. No pneumothorax or pleural
effusion.
IMPRESSION: NG tube in good position.  No acute abnormality.

## 2017-03-18 DIAGNOSIS — M5137 Other intervertebral disc degeneration, lumbosacral region: Secondary | ICD-10-CM | POA: Diagnosis not present

## 2017-03-18 DIAGNOSIS — I10 Essential (primary) hypertension: Secondary | ICD-10-CM | POA: Diagnosis not present

## 2017-03-18 DIAGNOSIS — K5090 Crohn's disease, unspecified, without complications: Secondary | ICD-10-CM | POA: Diagnosis not present

## 2017-03-18 DIAGNOSIS — M1990 Unspecified osteoarthritis, unspecified site: Secondary | ICD-10-CM | POA: Diagnosis not present

## 2017-03-18 DIAGNOSIS — F17210 Nicotine dependence, cigarettes, uncomplicated: Secondary | ICD-10-CM | POA: Diagnosis not present

## 2017-03-18 DIAGNOSIS — M5431 Sciatica, right side: Secondary | ICD-10-CM | POA: Diagnosis not present

## 2017-03-20 DIAGNOSIS — F17210 Nicotine dependence, cigarettes, uncomplicated: Secondary | ICD-10-CM | POA: Diagnosis not present

## 2017-03-20 DIAGNOSIS — K5090 Crohn's disease, unspecified, without complications: Secondary | ICD-10-CM | POA: Diagnosis not present

## 2017-03-20 DIAGNOSIS — M1990 Unspecified osteoarthritis, unspecified site: Secondary | ICD-10-CM | POA: Diagnosis not present

## 2017-03-20 DIAGNOSIS — M5431 Sciatica, right side: Secondary | ICD-10-CM | POA: Diagnosis not present

## 2017-03-20 DIAGNOSIS — I10 Essential (primary) hypertension: Secondary | ICD-10-CM | POA: Diagnosis not present

## 2017-03-20 DIAGNOSIS — M5137 Other intervertebral disc degeneration, lumbosacral region: Secondary | ICD-10-CM | POA: Diagnosis not present

## 2017-05-05 ENCOUNTER — Other Ambulatory Visit (INDEPENDENT_AMBULATORY_CARE_PROVIDER_SITE_OTHER): Payer: Self-pay | Admitting: Internal Medicine

## 2017-08-31 ENCOUNTER — Other Ambulatory Visit (INDEPENDENT_AMBULATORY_CARE_PROVIDER_SITE_OTHER): Payer: Self-pay | Admitting: Internal Medicine

## 2017-09-03 ENCOUNTER — Encounter (INDEPENDENT_AMBULATORY_CARE_PROVIDER_SITE_OTHER): Payer: Self-pay | Admitting: Internal Medicine

## 2017-09-03 ENCOUNTER — Ambulatory Visit (INDEPENDENT_AMBULATORY_CARE_PROVIDER_SITE_OTHER): Payer: Medicare Other | Admitting: Internal Medicine

## 2017-09-03 VITALS — BP 120/76 | HR 84 | Temp 98.4°F | Ht 62.0 in | Wt 137.0 lb

## 2017-09-03 DIAGNOSIS — K50011 Crohn's disease of small intestine with rectal bleeding: Secondary | ICD-10-CM

## 2017-09-03 NOTE — Patient Instructions (Signed)
Continue the Apriso. OV in 1 year. CBC and CRP in 1 year.

## 2017-09-03 NOTE — Progress Notes (Signed)
Subjective:    Patient ID: [name], female    DOB: January 17, 1946, 72 y.o.   MRN: [id number]  HPI Here today for f/u. Last seen in March of 2018. Hx of Small bowel Crohn's disease. She is doing good. No GI complaints. She is having a BM x 3 a day. Stools are loose which is normal for her. No abdominal pain. Appetite is good. She has lost 3 pounds since her lat visit. No melena or BRRB.  06/23/2006 Resection of terminal ileum and cecum (ileocestomy).    Review of Systems Past Medical History:  Diagnosis Date  . Arthritis   . Crohn's disease ([place])   . Degenerative disc disease, lumbar   . Hypertension   . Sciatica of right side   . Stroke ([place])   . Ventral hernia     Past Surgical History:  Procedure Laterality Date  . CAROTID ENDARTERECTOMY     2004  . CHOLECYSTECTOMY     2008  . ESOPHAGOGASTRODUODENOSCOPY N/A 02/24/2015   Procedure: ESOPHAGOGASTRODUODENOSCOPY (EGD);  Surgeon: Daneil Dolin, MD;  [facility];  Service: Endoscopy;  Laterality: N/A;  . HEMICOLECTOMY     right  . HIP ARTHROPLASTY Left 04/09/2015   Procedure: INSERT PARTIAL HIP REPLACEMENT LEFT;  Surgeon: Carole Civil, MD;  Location: [facility];  Service: Orthopedics;  Laterality: Left;  . INCISIONAL HERNIA REPAIR N/A 05/20/2013   Procedure: [name] HERNIORRHAPHY WITH MESH;  Surgeon: Jamesetta So, MD;  Location: [facility];  Service: General;  Laterality: N/A;  . INCISIONAL HERNIA REPAIR N/A 05/19/2014   Procedure: RECURRENT HERNIA REPAIR INCISIONAL;  Surgeon: Jamesetta So, MD;  Location: [facility];  Service: General;  Laterality: N/A;  . INSERTION OF MESH N/A 05/20/2013   Procedure: INSERTION OF MESH;  Surgeon: Jamesetta So, MD;  Location: [facility];  Service: General;  Laterality: N/A;  . INSERTION OF MESH N/A 05/19/2014   Procedure: INSERTION OF MESH;  Surgeon: Jamesetta So, MD;  Location: [facility];  Service: General;  Laterality: N/A;  . TUBAL LIGATION      Allergies  Allergen Reactions  .  Flagyl [Metronidazole Hcl] Other (See Comments)    "makes me feel whoosy and weird"  . Other     Unknown antibiotic and unknown pain medications     Current Outpatient Medications on File Prior to Visit  Medication Sig Dispense Refill  . acetaminophen (TYLENOL) 500 MG tablet Take 1 tablet (500 mg total) by mouth every 6 (six) hours as needed for mild pain.    . APRISO 0.375 g 24 hr capsule TAKE 4 CAPSULES BY MOUTH DAILY. 120 capsule 4  . aspirin EC 81 MG tablet Take 1 tablet (81 mg total) by mouth daily. Resume in 1 week (Patient taking differently: Take 81 mg by mouth daily. )    . diphenhydrAMINE (SOMINEX) 25 MG tablet Take 25 mg by mouth at bedtime as needed for sleep.    Marland Kitchen lisinopril (PRINIVIL,ZESTRIL) 10 MG tablet Take 10 mg by mouth daily.     No current facility-administered medications on file prior to visit.         Objective:   Physical Exam Blood pressure 120/76, pulse 84, temperature 98.4 F (36.9 C), height 5' 2"  (1.575 m), weight 137 lb (62.1 kg). Alert and oriented. Skin warm and dry. Oral mucosa is moist.   . Sclera anicteric, conjunctivae is pink. Thyroid not enlarged. No cervical lymphadenopathy. Lungs clear. Heart regular rate and rhythm.  Abdomen is soft. Bowel sounds are positive. No hepatomegaly. No abdominal masses felt. No tenderness.  No edema to lower extremities.  .         Assessment & Plan:  Small bowel Crohns. She is doing well. Will get a CRP and CBC. She does not desire a surveillance colonoscopy.

## 2017-09-04 LAB — CBC WITH DIFFERENTIAL/PLATELET
BASOS ABS: 62 {cells}/uL (ref 0–200)
Basophils Relative: 0.5 %
EOS ABS: 148 {cells}/uL (ref 15–500)
Eosinophils Relative: 1.2 %
HCT: 37.5 % (ref 35.0–45.0)
HEMOGLOBIN: 12.6 g/dL (ref 11.7–15.5)
Lymphs Abs: 2977 cells/uL (ref 850–3900)
MCH: 31.9 pg (ref 27.0–33.0)
MCHC: 33.6 g/dL (ref 32.0–36.0)
MCV: 94.9 fL (ref 80.0–100.0)
MONOS PCT: 5.5 %
MPV: 9.5 fL (ref 7.5–12.5)
NEUTROS ABS: 8438 {cells}/uL — AB (ref 1500–7800)
Neutrophils Relative %: 68.6 %
Platelets: 306 10*3/uL (ref 140–400)
RBC: 3.95 10*6/uL (ref 3.80–5.10)
RDW: 12.2 % (ref 11.0–15.0)
TOTAL LYMPHOCYTE: 24.2 %
WBC mixed population: 677 cells/uL (ref 200–950)
WBC: 12.3 10*3/uL — ABNORMAL HIGH (ref 3.8–10.8)

## 2017-09-04 LAB — C-REACTIVE PROTEIN: CRP: 6.6 mg/L (ref <8.0)

## 2017-11-23 DIAGNOSIS — Z1389 Encounter for screening for other disorder: Secondary | ICD-10-CM | POA: Diagnosis not present

## 2017-11-23 DIAGNOSIS — Z6825 Body mass index (BMI) 25.0-25.9, adult: Secondary | ICD-10-CM | POA: Diagnosis not present

## 2017-11-23 DIAGNOSIS — Z0001 Encounter for general adult medical examination with abnormal findings: Secondary | ICD-10-CM | POA: Diagnosis not present

## 2017-11-23 DIAGNOSIS — E785 Hyperlipidemia, unspecified: Secondary | ICD-10-CM | POA: Diagnosis not present

## 2017-11-23 DIAGNOSIS — Z0000 Encounter for general adult medical examination without abnormal findings: Secondary | ICD-10-CM | POA: Diagnosis not present

## 2017-11-23 DIAGNOSIS — E663 Overweight: Secondary | ICD-10-CM | POA: Diagnosis not present

## 2018-01-13 ENCOUNTER — Other Ambulatory Visit: Payer: Self-pay

## 2018-03-02 ENCOUNTER — Other Ambulatory Visit (INDEPENDENT_AMBULATORY_CARE_PROVIDER_SITE_OTHER): Payer: Self-pay | Admitting: Internal Medicine

## 2018-05-03 DIAGNOSIS — Z23 Encounter for immunization: Secondary | ICD-10-CM | POA: Diagnosis not present

## 2018-05-10 DIAGNOSIS — K5090 Crohn's disease, unspecified, without complications: Secondary | ICD-10-CM | POA: Diagnosis not present

## 2018-05-10 DIAGNOSIS — Z6823 Body mass index (BMI) 23.0-23.9, adult: Secondary | ICD-10-CM | POA: Diagnosis not present

## 2018-05-10 DIAGNOSIS — N3090 Cystitis, unspecified without hematuria: Secondary | ICD-10-CM | POA: Diagnosis not present

## 2018-05-10 DIAGNOSIS — M1991 Primary osteoarthritis, unspecified site: Secondary | ICD-10-CM | POA: Diagnosis not present

## 2018-05-10 DIAGNOSIS — Z1389 Encounter for screening for other disorder: Secondary | ICD-10-CM | POA: Diagnosis not present

## 2018-06-29 DIAGNOSIS — H35033 Hypertensive retinopathy, bilateral: Secondary | ICD-10-CM | POA: Diagnosis not present

## 2018-07-01 ENCOUNTER — Other Ambulatory Visit (INDEPENDENT_AMBULATORY_CARE_PROVIDER_SITE_OTHER): Payer: Self-pay | Admitting: Internal Medicine

## 2018-07-13 ENCOUNTER — Telehealth (INDEPENDENT_AMBULATORY_CARE_PROVIDER_SITE_OTHER): Payer: Self-pay | Admitting: Internal Medicine

## 2018-07-13 NOTE — Telephone Encounter (Signed)
Patient left message stating she received something in the mail concerning her medication - please call [phone]

## 2018-07-14 NOTE — Telephone Encounter (Signed)
Patient states that her Insurance has changed and they are not going to pay for her Apriso any more. They will pay for the Delzicol 400 mg, she is asking if this will be acceptable and if so call it to [place] before the 1 st of February.  WIll address with Dr.Rehman.

## 2018-07-16 NOTE — Telephone Encounter (Signed)
Dr.Rehman was paged earlier to address this.

## 2018-07-26 ENCOUNTER — Other Ambulatory Visit (INDEPENDENT_AMBULATORY_CARE_PROVIDER_SITE_OTHER): Payer: Self-pay | Admitting: *Deleted

## 2018-07-26 DIAGNOSIS — K5090 Crohn's disease, unspecified, without complications: Secondary | ICD-10-CM

## 2018-07-26 MED ORDER — MESALAMINE 400 MG PO CPDR: DELAYED_RELEASE_CAPSULE | ORAL | 3 refills | Status: DC

## 2018-07-26 NOTE — Telephone Encounter (Signed)
Insurance will not pay for the Apriso. They will cover the Delzicol 400 mg.  Per Dr.Rehman the patient may take the Delzicol 400 mg - take 2 by mouth twice daily. #360 with 3 refills. This was escribed to the [facility].

## 2018-07-26 NOTE — Telephone Encounter (Signed)
Per Dr.Rehman the patient may try the Delzicol 400 mg - take 2 capsules by mouth twice daily. #360 3 refills. This was e scribed to the patient's pharmacy.

## 2018-09-06 ENCOUNTER — Ambulatory Visit (INDEPENDENT_AMBULATORY_CARE_PROVIDER_SITE_OTHER): Payer: Medicare Other | Admitting: Internal Medicine

## 2018-09-27 ENCOUNTER — Ambulatory Visit (INDEPENDENT_AMBULATORY_CARE_PROVIDER_SITE_OTHER): Payer: Medicare Other | Admitting: Internal Medicine

## 2018-10-05 ENCOUNTER — Ambulatory Visit (INDEPENDENT_AMBULATORY_CARE_PROVIDER_SITE_OTHER): Payer: Medicare Other | Admitting: Internal Medicine

## 2018-10-05 ENCOUNTER — Other Ambulatory Visit: Payer: Self-pay

## 2018-10-05 ENCOUNTER — Telehealth (INDEPENDENT_AMBULATORY_CARE_PROVIDER_SITE_OTHER): Payer: Self-pay | Admitting: Internal Medicine

## 2018-10-05 ENCOUNTER — Encounter (INDEPENDENT_AMBULATORY_CARE_PROVIDER_SITE_OTHER): Payer: Self-pay | Admitting: Internal Medicine

## 2018-10-05 DIAGNOSIS — K5090 Crohn's disease, unspecified, without complications: Secondary | ICD-10-CM

## 2018-10-05 NOTE — Progress Notes (Signed)
Subjective:  Start time 10:10am. End  10:25am. Total time 15 minutes.                                                                                                                             One year follow up for Crohn's disease.   Patient ID: [name], female    DOB: [birth year], 73 y.o.   MRN: [id number] This is a telephone OV. Patient is at home. I am in the office. Baker Janus consents to talk with me via telephone. . Telephone OV due to the risk of Covid-19. Unable to do video OV. She tells me she is doing good. She tells me her  weight  Is 126.2lbs. Has lost about 10 pounds. She is not having any abdominal pain. Usually has about 2-3 BMs a day. Has not seen any blood in her stool.  Her appetite is good. Denies any joint pain.  She does not desire a  Future colonoscopy.  She seems to in remission. She is doing well.  Last CBC and CRP were normal.     06/23/2006 Resection of terminal ileum and cecum (ileocestomy).   CBC    Component Value Date/Time   WBC 12.3 (H) 09/03/2017 1455   RBC 3.95 09/03/2017 1455   HGB 12.6 09/03/2017 1455   HCT 37.5 09/03/2017 1455   PLT 306 09/03/2017 1455   MCV 94.9 09/03/2017 1455   MCH 31.9 09/03/2017 1455   MCHC 33.6 09/03/2017 1455   RDW 12.2 09/03/2017 1455   LYMPHSABS 2,977 09/03/2017 1455   MONOABS 0.6 04/13/2015 0735   EOSABS 148 09/03/2017 1455   BASOSABS 62 09/03/2017 1455   09/04/2018 6.6.     Review of Systems Past Medical History:  Diagnosis Date  . Arthritis   . Crohn's disease ([place])   . Degenerative disc disease, lumbar   . Hypertension   . Sciatica of right side   . Stroke ([place])   . Ventral hernia     Past Surgical History:  Procedure Laterality Date  . CAROTID ENDARTERECTOMY     2004  . CHOLECYSTECTOMY     2008  . ESOPHAGOGASTRODUODENOSCOPY N/A 02/24/2015   Procedure: ESOPHAGOGASTRODUODENOSCOPY (EGD);  Surgeon: Daneil Dolin, MD;  [facility];  Service: Endoscopy;  Laterality: N/A;  . HEMICOLECTOMY      right  . HIP ARTHROPLASTY Left 04/09/2015   Procedure: INSERT PARTIAL HIP REPLACEMENT LEFT;  Surgeon: Carole Civil, MD;  Location: [facility];  Service: Orthopedics;  Laterality: Left;  . INCISIONAL HERNIA REPAIR N/A 05/20/2013   Procedure: [name] HERNIORRHAPHY WITH MESH;  Surgeon: Jamesetta So, MD;  Location: [facility];  Service: General;  Laterality: N/A;  . INCISIONAL HERNIA REPAIR N/A 05/19/2014   Procedure: RECURRENT HERNIA REPAIR INCISIONAL;  Surgeon: Jamesetta So, MD;  Location: [facility];  Service: General;  Laterality: N/A;  . INSERTION OF MESH N/A 05/20/2013   Procedure: INSERTION OF MESH;  Surgeon:  Jamesetta So, MD;  Location: [facility];  Service: General;  Laterality: N/A;  . INSERTION OF MESH N/A 05/19/2014   Procedure: INSERTION OF MESH;  Surgeon: Jamesetta So, MD;  Location: [facility];  Service: General;  Laterality: N/A;  . TUBAL LIGATION      Allergies  Allergen Reactions  . Flagyl [Metronidazole Hcl] Other (See Comments)    "makes me feel whoosy and weird"  . Other     Unknown antibiotic and unknown pain medications     Current Outpatient Medications on File Prior to Visit  Medication Sig Dispense Refill  . acetaminophen (TYLENOL) 500 MG tablet Take 1 tablet (500 mg total) by mouth every 6 (six) hours as needed for mild pain.    Marland Kitchen aspirin EC 81 MG tablet Take 1 tablet (81 mg total) by mouth daily. Resume in 1 week (Patient taking differently: Take 81 mg by mouth daily. )    . diphenhydrAMINE (SOMINEX) 25 MG tablet Take 25 mg by mouth at bedtime as needed for sleep.    Marland Kitchen lisinopril (PRINIVIL,ZESTRIL) 10 MG tablet Take 10 mg by mouth daily.    . Mesalamine (DELZICOL) 400 MG CPDR DR capsule The patient will take 2 capsules by mouth 2 times daily. 360 capsule 3   No current facility-administered medications on file prior to visit.         Objective:   Physical Exam deferred       Assessment & Plan:  Crohn's disease. She is doing well. CBC and CRP to be drawn.  OV in 1 year.

## 2018-10-05 NOTE — Telephone Encounter (Signed)
err

## 2018-10-05 NOTE — Patient Instructions (Signed)
CBC and CRP. OV in 1 year. 

## 2018-10-26 DIAGNOSIS — K5090 Crohn's disease, unspecified, without complications: Secondary | ICD-10-CM | POA: Diagnosis not present

## 2018-10-27 LAB — CBC WITH DIFFERENTIAL/PLATELET
Absolute Monocytes: 646 cells/uL (ref 200–950)
Basophils Absolute: 71 cells/uL (ref 0–200)
Basophils Relative: 0.7 %
Eosinophils Absolute: 162 cells/uL (ref 15–500)
Eosinophils Relative: 1.6 %
HCT: 39.3 % (ref 35.0–45.0)
Hemoglobin: 13.3 g/dL (ref 11.7–15.5)
Lymphs Abs: 2505 cells/uL (ref 850–3900)
MCH: 33 pg (ref 27.0–33.0)
MCHC: 33.8 g/dL (ref 32.0–36.0)
MCV: 97.5 fL (ref 80.0–100.0)
MPV: 9.5 fL (ref 7.5–12.5)
Monocytes Relative: 6.4 %
Neutro Abs: 6717 cells/uL (ref 1500–7800)
Neutrophils Relative %: 66.5 %
Platelets: 256 10*3/uL (ref 140–400)
RBC: 4.03 10*6/uL (ref 3.80–5.10)
RDW: 12.2 % (ref 11.0–15.0)
Total Lymphocyte: 24.8 %
WBC: 10.1 10*3/uL (ref 3.8–10.8)

## 2018-10-27 LAB — C-REACTIVE PROTEIN: CRP: 3.6 mg/L (ref <8.0)

## 2018-12-01 DIAGNOSIS — Z6823 Body mass index (BMI) 23.0-23.9, adult: Secondary | ICD-10-CM | POA: Diagnosis not present

## 2018-12-01 DIAGNOSIS — Z1389 Encounter for screening for other disorder: Secondary | ICD-10-CM | POA: Diagnosis not present

## 2018-12-01 DIAGNOSIS — R110 Nausea: Secondary | ICD-10-CM | POA: Diagnosis not present

## 2018-12-01 DIAGNOSIS — Z0000 Encounter for general adult medical examination without abnormal findings: Secondary | ICD-10-CM | POA: Diagnosis not present

## 2018-12-01 DIAGNOSIS — I10 Essential (primary) hypertension: Secondary | ICD-10-CM | POA: Diagnosis not present

## 2019-05-04 ENCOUNTER — Other Ambulatory Visit (INDEPENDENT_AMBULATORY_CARE_PROVIDER_SITE_OTHER): Payer: Self-pay | Admitting: Internal Medicine

## 2019-05-04 DIAGNOSIS — K5090 Crohn's disease, unspecified, without complications: Secondary | ICD-10-CM

## 2019-05-16 DIAGNOSIS — I10 Essential (primary) hypertension: Secondary | ICD-10-CM | POA: Diagnosis not present

## 2019-05-16 DIAGNOSIS — M1991 Primary osteoarthritis, unspecified site: Secondary | ICD-10-CM | POA: Diagnosis not present

## 2019-05-16 DIAGNOSIS — K5090 Crohn's disease, unspecified, without complications: Secondary | ICD-10-CM | POA: Diagnosis not present

## 2019-06-16 DIAGNOSIS — I10 Essential (primary) hypertension: Secondary | ICD-10-CM | POA: Diagnosis not present

## 2019-06-16 DIAGNOSIS — K5090 Crohn's disease, unspecified, without complications: Secondary | ICD-10-CM | POA: Diagnosis not present

## 2019-07-17 DIAGNOSIS — I10 Essential (primary) hypertension: Secondary | ICD-10-CM | POA: Diagnosis not present

## 2019-07-17 DIAGNOSIS — M1991 Primary osteoarthritis, unspecified site: Secondary | ICD-10-CM | POA: Diagnosis not present

## 2019-07-17 DIAGNOSIS — K5090 Crohn's disease, unspecified, without complications: Secondary | ICD-10-CM | POA: Diagnosis not present

## 2019-08-14 DIAGNOSIS — I10 Essential (primary) hypertension: Secondary | ICD-10-CM | POA: Diagnosis not present

## 2019-08-14 DIAGNOSIS — K5090 Crohn's disease, unspecified, without complications: Secondary | ICD-10-CM | POA: Diagnosis not present

## 2019-08-14 DIAGNOSIS — M1991 Primary osteoarthritis, unspecified site: Secondary | ICD-10-CM | POA: Diagnosis not present

## 2019-09-14 DIAGNOSIS — M1991 Primary osteoarthritis, unspecified site: Secondary | ICD-10-CM | POA: Diagnosis not present

## 2019-09-14 DIAGNOSIS — I10 Essential (primary) hypertension: Secondary | ICD-10-CM | POA: Diagnosis not present

## 2019-09-14 DIAGNOSIS — K5000 Crohn's disease of small intestine without complications: Secondary | ICD-10-CM | POA: Diagnosis not present

## 2019-10-05 ENCOUNTER — Ambulatory Visit (INDEPENDENT_AMBULATORY_CARE_PROVIDER_SITE_OTHER): Payer: Medicare Other | Admitting: Nurse Practitioner

## 2019-10-14 DIAGNOSIS — K5000 Crohn's disease of small intestine without complications: Secondary | ICD-10-CM | POA: Diagnosis not present

## 2019-10-14 DIAGNOSIS — I10 Essential (primary) hypertension: Secondary | ICD-10-CM | POA: Diagnosis not present

## 2019-10-14 DIAGNOSIS — M1991 Primary osteoarthritis, unspecified site: Secondary | ICD-10-CM | POA: Diagnosis not present

## 2019-10-20 ENCOUNTER — Other Ambulatory Visit (INDEPENDENT_AMBULATORY_CARE_PROVIDER_SITE_OTHER): Payer: Self-pay | Admitting: Internal Medicine

## 2019-10-20 DIAGNOSIS — K5090 Crohn's disease, unspecified, without complications: Secondary | ICD-10-CM

## 2019-11-02 ENCOUNTER — Ambulatory Visit (INDEPENDENT_AMBULATORY_CARE_PROVIDER_SITE_OTHER): Payer: Medicare Other | Admitting: Gastroenterology

## 2019-11-02 ENCOUNTER — Telehealth (INDEPENDENT_AMBULATORY_CARE_PROVIDER_SITE_OTHER): Payer: Self-pay | Admitting: Internal Medicine

## 2019-11-02 NOTE — Telephone Encounter (Signed)
Per Dr.Rehman the patient may try Sulfasalazine 1 gram by mouth tiwce a day #120 with 5 refills. Patient will also need to take the Folic Acid 1 mg by mouth daily. #30 5 Refills. This was called to [facility]/April.  Patient was called and made aware.  [name]

## 2019-11-02 NOTE — Telephone Encounter (Signed)
Patient called stated she has changed insurance to [facility] - states they will not cover the mesalamine but they do cover balsalazide and sulfasalazine - please advise on which one she should change to - ph# [phone]

## 2019-11-14 DIAGNOSIS — M1991 Primary osteoarthritis, unspecified site: Secondary | ICD-10-CM | POA: Diagnosis not present

## 2019-11-14 DIAGNOSIS — I10 Essential (primary) hypertension: Secondary | ICD-10-CM | POA: Diagnosis not present

## 2019-11-14 DIAGNOSIS — K5000 Crohn's disease of small intestine without complications: Secondary | ICD-10-CM | POA: Diagnosis not present

## 2019-12-12 ENCOUNTER — Ambulatory Visit (INDEPENDENT_AMBULATORY_CARE_PROVIDER_SITE_OTHER): Payer: Medicare Other | Admitting: Gastroenterology

## 2020-01-18 ENCOUNTER — Other Ambulatory Visit: Payer: Self-pay

## 2020-01-18 ENCOUNTER — Encounter (INDEPENDENT_AMBULATORY_CARE_PROVIDER_SITE_OTHER): Payer: Self-pay | Admitting: Gastroenterology

## 2020-01-18 ENCOUNTER — Ambulatory Visit (INDEPENDENT_AMBULATORY_CARE_PROVIDER_SITE_OTHER): Payer: Medicare Other | Admitting: Gastroenterology

## 2020-01-18 VITALS — BP 96/64 | HR 87 | Temp 98.7°F | Ht 63.0 in | Wt 128.5 lb

## 2020-01-18 DIAGNOSIS — K5090 Crohn's disease, unspecified, without complications: Secondary | ICD-10-CM

## 2020-01-18 NOTE — Patient Instructions (Signed)
We will see you in one year-call sooner if develop any diarrhea, abdominal pain, blood in stool, poor appetite, etc.

## 2020-01-18 NOTE — Progress Notes (Signed)
Patient profile: [name] is a 74 y.o. female seen for follow-up of Crohn's disease.  She was last seen April 2020 with a virtual visit.  Past medical history of small bowel Crohn's disease, she has been maintained on Delzicol in the past.  Hospitalization in 2016 for small bowel obstruction.  In May 2021 she switched from Delzicol to sulfasalazine given insurance changes.  History of Present Illness: [name] is seen today for follow up. She reports having 2-3 Bm/day usually, these can vary from loose-formed depending on diet. She has been avoiding foods such as tomato, cornbread- these trigger diarrhea. Other days has some mild constipation and doesn't have a BM at all.  No blood in stool.  Denies frequent abdominal pain  Denies any frequent GERD, nausea/vomiting, dysphagia. Reports good appetite.  No specific complaints today.  She takes tylenol for pain. Denies any nsaid use. No alcohol. Smokes 1.5 PPD (trying to decrease a 1PPD)  Wt Readings from Last 3 Encounters:  01/18/20 128 lb 8 oz (58.3 kg)  09/03/17 137 lb (62.1 kg)  02/23/17 136 lb (61.7 kg)  Discussed noted weight loss with patient, she reports in 2019 she had a fall and was in a nursing/assisted living facility which is when she lost quite a bit of weight and has been stable since.  She currently denies any abdominal pain, early satiety, etc.  Feels her appetite is good.   Last Endoscopy: 02/2015-Multiple gastric erosions/ulcerations. Punctate gastric hemorrhages (only latter felt to be related to NG tube trauma). Areas of extensive erosion and ulceration likely pre-existing. Differential diagnosis includes Crohn's disease, ischemia. Would be quiteunusual to be related to the trauma heaving alone. These were not Cameron lesions. H. pylori could be in the "background". Based on interview with the family, there is no NSAID exposure aside from one aspirin daily.   Past Medical History:  Past Medical History:  Diagnosis  Date  . Arthritis   . Crohn's disease ([place])   . Degenerative disc disease, lumbar   . Hypertension   . Sciatica of right side   . Stroke ([place])   . Ventral hernia     Problem List: Patient Active Problem List   Diagnosis Date Noted  . Postoperative anemia due to acute blood loss   . Subcapital fracture of hip ([place]) 04/09/2015  . Hip fracture ([place]) 04/09/2015  . Left displaced femoral neck fracture ([place]) 04/09/2015  . Gastric ulceration   . Upper GI bleed   . UTI (lower urinary tract infection) 02/21/2015  . Trichomonal infection 02/21/2015  . Small bowel obstruction ([place]) 05/14/2013  . Ventral hernia 06/03/2011  . HTN (hypertension) 06/03/2011  . SBO (small bowel obstruction) ([place]) 05/31/2011  . Crohn's disease ([place]) 05/31/2011  . Tobacco abuse 05/31/2011    Past Surgical History: Past Surgical History:  Procedure Laterality Date  . CAROTID ENDARTERECTOMY     2004  . CHOLECYSTECTOMY     2008  . ESOPHAGOGASTRODUODENOSCOPY N/A 02/24/2015   Procedure: ESOPHAGOGASTRODUODENOSCOPY (EGD);  Surgeon: Daneil Dolin, MD;  [facility];  Service: Endoscopy;  Laterality: N/A;  . HEMICOLECTOMY     right  . HIP ARTHROPLASTY Left 04/09/2015   Procedure: INSERT PARTIAL HIP REPLACEMENT LEFT;  Surgeon: Carole Civil, MD;  Location: [facility];  Service: Orthopedics;  Laterality: Left;  . INCISIONAL HERNIA REPAIR N/A 05/20/2013   Procedure: [name] HERNIORRHAPHY WITH MESH;  Surgeon: Jamesetta So, MD;  Location: [facility];  Service: General;  Laterality:  N/A;  . INCISIONAL HERNIA REPAIR N/A 05/19/2014   Procedure: RECURRENT HERNIA REPAIR INCISIONAL;  Surgeon: Jamesetta So, MD;  Location: [facility];  Service: General;  Laterality: N/A;  . INSERTION OF MESH N/A 05/20/2013   Procedure: INSERTION OF MESH;  Surgeon: Jamesetta So, MD;  Location: [facility];  Service: General;  Laterality: N/A;  . INSERTION OF MESH N/A 05/19/2014   Procedure: INSERTION OF MESH;  Surgeon: Jamesetta So, MD;   Location: [facility];  Service: General;  Laterality: N/A;  . TUBAL LIGATION      Allergies: Allergies  Allergen Reactions  . Flagyl [Metronidazole Hcl] Other (See Comments)    "makes me feel whoosy and weird"  . Other     Unknown antibiotic and unknown pain medications       Home Medications:  Current Outpatient Medications:  .  acetaminophen (TYLENOL) 500 MG tablet, Take 1 tablet (500 mg total) by mouth every 6 (six) hours as needed for mild pain., Disp: , Rfl:  .  aspirin EC 81 MG tablet, Take 1 tablet (81 mg total) by mouth daily. Resume in 1 week (Patient taking differently: Take 81 mg by mouth daily. ), Disp: , Rfl:  .  diphenhydrAMINE (SOMINEX) 25 MG tablet, Take 25 mg by mouth at bedtime as needed for sleep., Disp: , Rfl:  .  folic acid (FOLVITE) 1 MG tablet, Take 1 mg by mouth daily., Disp: , Rfl:  .  lisinopril (PRINIVIL,ZESTRIL) 10 MG tablet, Take 10 mg by mouth daily., Disp: , Rfl:  .  Mesalamine (ASACOL) 400 MG CPDR DR capsule, TAKE 2 CAPSULES BY MOUTH TWO TIMES DAILY., Disp: 360 capsule, Rfl: 1 .  ondansetron (ZOFRAN-ODT) 4 MG disintegrating tablet, Take 4 mg by mouth every 6 (six) hours as needed., Disp: , Rfl:  .  sulfaSALAzine (AZULFIDINE) 500 MG tablet, Take 1,000 mg by mouth 2 (two) times daily., Disp: , Rfl:    Family History: family history includes GI problems in her father; Heart attack in her mother; Hypotension in her sister; Stroke in her brother.    Social History:   reports that she has been smoking. She has a 30.00 pack-year smoking history. She has never used smokeless tobacco. She reports that she does not drink alcohol and does not use drugs.   Review of Systems: Constitutional: Denies weight loss/weight gain  Eyes: No changes in vision. ENT: No oral lesions, sore throat.  GI: see HPI.  Heme/Lymph: No easy bruising.  CV: No chest pain.  GU: No hematuria.  Integumentary: No rashes.  Neuro: No headaches.  Psych: No depression/anxiety.  Endocrine:  No heat/cold intolerance.  Allergic/Immunologic: No urticaria.  Resp: No cough, SOB.  Musculoskeletal: No joint swelling.    Physical Examination: BP 96/64 (BP Location: Right Arm, Patient Position: Sitting, Cuff Size: Normal)   Pulse 87   Temp 98.7 F (37.1 C) (Oral)   Ht 5' 3"  (1.6 m)   Wt 128 lb 8 oz (58.3 kg)   BMI 22.76 kg/m  Gen: NAD, alert and oriented x 4 HEENT: PEERLA, EOMI, Neck: supple, no JVD Chest: CTA bilaterally, no wheezes, crackles, or other adventitious sounds CV: RRR, no m/g/c/r Abd: soft, NT, ND, +BS in all four quadrants; no HSM, guarding, ridigity, or rebound tenderness Ext: no edema, well perfused with 2+ pulses, Skin: no rash or lesions noted on observed skin Lymph: no noted LAD  Data Reviewed:   Requesting most recent labs from PCP done last week.   Assessment/Plan:  Ms. Schmoker is a 74 y.o. female    Diagnoses and all orders for this visit:  Crohn's disease without complication, unspecified gastrointestinal tract location ([place])    1.  Crohn's-recently switched from Delzicol to sulfasalazine & folic acid given insurance issues. She denies any changes in symptoms with the switch. She feels overall symptoms are well controlled as long as she avoids trigger foods. We will continue current regimen.  She had labs last week at her primary care, will request these.  She has declined colonoscopy in the past for evaluation of endoscopic disease activity. She is trying to decrease smoking. She will notify me if develops symptoms sooner than 1 year, otherwise follow-up 1 year.   I personally performed the service, non-incident to. (WP)  Laurine Blazer, [facility]

## 2020-05-28 ENCOUNTER — Other Ambulatory Visit (INDEPENDENT_AMBULATORY_CARE_PROVIDER_SITE_OTHER): Payer: Self-pay | Admitting: Gastroenterology

## 2020-05-28 ENCOUNTER — Telehealth (INDEPENDENT_AMBULATORY_CARE_PROVIDER_SITE_OTHER): Payer: Self-pay | Admitting: Gastroenterology

## 2020-05-28 DIAGNOSIS — K50019 Crohn's disease of small intestine with unspecified complications: Secondary | ICD-10-CM

## 2020-05-28 NOTE — Progress Notes (Signed)
labs

## 2020-05-28 NOTE — Telephone Encounter (Signed)
Hi Tammy,  Ok to call but will need to order surveillance labs  Maylon Peppers, MD Gastroenterology and Hepatology [facility]

## 2020-05-28 NOTE — Telephone Encounter (Signed)
Talked with the patient and she is needing refills on Sulfasalazine and Folic Acid. She will be running out this week.  She was last seen in the office in August by Thayer Headings. She does have a follow up visit . Last lab work was done in May 2020. Patient uses [facility] and ask that we have them deliver to her home and she will pay in cash.  Dr. Jenetta Downer may I call this in for her? There is additional information on a sticker in front of your keyboard.  Thank you.

## 2020-05-28 NOTE — Telephone Encounter (Signed)
Patient called regarding a refill request -stated pharmacy has faxed it over - please advise - ph# [phone]

## 2020-05-28 NOTE — Telephone Encounter (Signed)
Per Dr.Castaneda may call in Sulfasalazine 500 mg - take 2 by mouth twice a day #120 5 refills. Folic Acid 1 mg take 1 by mouth daily #30 5 refills. This was called to the [facility]. They will deliver it tomorrow 05/29/2020 mid morning.  Patient was called and made aware and also that she will need to get lab work done asher last lab work was May 2020. Patient states that she will try and get done after Christmas.

## 2020-10-29 ENCOUNTER — Other Ambulatory Visit (INDEPENDENT_AMBULATORY_CARE_PROVIDER_SITE_OTHER): Payer: Self-pay | Admitting: Gastroenterology

## 2021-01-14 ENCOUNTER — Ambulatory Visit (INDEPENDENT_AMBULATORY_CARE_PROVIDER_SITE_OTHER): Payer: Medicare Other | Admitting: Gastroenterology

## 2021-05-07 ENCOUNTER — Emergency Department (HOSPITAL_COMMUNITY)
Admission: EM | Admit: 2021-05-07 | Discharge: 2021-05-07 | Disposition: A | Discharge status: Home / Self Care | Payer: Medicare Other | Attending: Emergency Medicine | Admitting: Emergency Medicine

## 2021-05-07 ENCOUNTER — Encounter (HOSPITAL_COMMUNITY): Payer: Self-pay | Admitting: *Deleted

## 2021-05-07 ENCOUNTER — Other Ambulatory Visit: Payer: Self-pay

## 2021-05-07 DIAGNOSIS — Z79899 Other long term (current) drug therapy: Secondary | ICD-10-CM | POA: Insufficient documentation

## 2021-05-07 DIAGNOSIS — B029 Zoster without complications: Secondary | ICD-10-CM | POA: Insufficient documentation

## 2021-05-07 DIAGNOSIS — R21 Rash and other nonspecific skin eruption: Secondary | ICD-10-CM | POA: Diagnosis present

## 2021-05-07 DIAGNOSIS — Z96642 Presence of left artificial hip joint: Secondary | ICD-10-CM | POA: Insufficient documentation

## 2021-05-07 DIAGNOSIS — F17210 Nicotine dependence, cigarettes, uncomplicated: Secondary | ICD-10-CM | POA: Diagnosis not present

## 2021-05-07 DIAGNOSIS — R109 Unspecified abdominal pain: Secondary | ICD-10-CM | POA: Diagnosis not present

## 2021-05-07 DIAGNOSIS — B0223 Postherpetic polyneuropathy: Secondary | ICD-10-CM

## 2021-05-07 DIAGNOSIS — Z7982 Long term (current) use of aspirin: Secondary | ICD-10-CM | POA: Diagnosis not present

## 2021-05-07 DIAGNOSIS — I10 Essential (primary) hypertension: Secondary | ICD-10-CM | POA: Diagnosis not present

## 2021-05-07 MED ORDER — HYDROCODONE-ACETAMINOPHEN 5-325 MG PO TABS: 1.0000 | ORAL_TABLET | ORAL | 0 refills | Status: DC | PRN

## 2021-05-07 MED ORDER — ACYCLOVIR 400 MG PO TABS: 800.0000 mg | ORAL_TABLET | Freq: Every day | ORAL | 0 refills | Status: DC

## 2021-05-07 NOTE — Discharge Instructions (Addendum)
Return to ED for any new or worsening symptoms such as a similar rash that appears on the tip of the nose  Take prescribed anti-viral medication as directed Take pain medication ONLY WHEN ABSOLUTELY NECESARRY Do not drive or operate machinery while on pain medication Pain medication will make you dizzy so it is important to try and take it ONLY AT NIGHT to prevent falls Pain medication will cause constipation so purchase a laxative if this becomes an issue Follow up with PCP in 7 days for recheck

## 2021-05-07 NOTE — ED Provider Notes (Signed)
[facility] EMERGENCY DEPARTMENT Provider Note   CSN: [id number] Arrival date & time: 05/07/21  1634     History Chief Complaint  Patient presents with   Flank Pain    [name] is a 75 y.o. female with medical history significant for Crohn's disease, CVA (15 to 16 years ago).  Patient presents ED today for complaint of rash to her flank.  Patient states that she began to have pain in this area starting on Sunday, and today she noticed a rash that wraps from underneath her right breast to her upper back.  Patient continues that the pain is described describes the pain as stinging, burning, itching.  Patient denies alleviating or aggravating factors.  Patient denies nausea, vomiting, diarrhea, fevers, abdominal pain, painful urination.   Flank Pain Pertinent negatives include no chest pain, no abdominal pain and no shortness of breath.      Past Medical History:  Diagnosis Date   Arthritis    Crohn's disease (HCC)    Degenerative disc disease, lumbar    Hypertension    Sciatica of right side    Stroke (HCC)    Ventral hernia     Patient Active Problem List   Diagnosis Date Noted   Postoperative anemia due to acute blood loss    Subcapital fracture of hip (HCC) 04/09/2015   Hip fracture (HCC) 04/09/2015   Left displaced femoral neck fracture (HCC) 04/09/2015   Gastric ulceration    Upper GI bleed    UTI (lower urinary tract infection) 02/21/2015   Trichomonal infection 02/21/2015   Small bowel obstruction (HCC) 05/14/2013   Ventral hernia 06/03/2011   HTN (hypertension) 06/03/2011   SBO (small bowel obstruction) (HCC) 05/31/2011   Crohn's disease (HCC) 05/31/2011   Tobacco abuse 05/31/2011    Past Surgical History:  Procedure Laterality Date   CAROTID ENDARTERECTOMY     2004   CHOLECYSTECTOMY     20 08   ESOPHAGOGASTRODUODENOSCOPY N/A 02/24/2015   Procedure: ESOPHAGOGASTRODUODENOSCOPY (EGD);  Surgeon: Daneil Dolin, MD;  Location: [facility];  Service:  Endoscopy;  Laterality: N/A;   HEMICOLECTOMY     right   HIP ARTHROPLASTY Left 04/09/2015   Procedure: INSERT PARTIAL HIP REPLACEMENT LEFT;  Surgeon: Carole Civil, MD;  Location: [facility];  Service: Orthopedics;  Laterality: Left;   INCISIONAL HERNIA REPAIR N/A 05/20/2013   Procedure: [name] HERNIORRHAPHY WITH MESH;  Surgeon: Jamesetta So, MD;  Location: [facility];  Service: General;  Laterality: N/A;   [place] N/A 05/19/2014   Procedure: RECURRENT HERNIA REPAIR INCISIONAL;  Surgeon: Jamesetta So, MD;  Location: [facility];  Service: General;  Laterality: N/A;   INSERTION OF MESH N/A 05/20/2013   Procedure: INSERTION OF MESH;  Surgeon: Jamesetta So, MD;  Location: [facility];  Service: General;  Laterality: N/A;   INSERTION OF MESH N/A 05/19/2014   Procedure: INSERTION OF MESH;  Surgeon: Jamesetta So, MD;  Location: [facility];  Service: General;  Laterality: N/A;   TUBAL LIGATION       OB History   No obstetric history on file.     Family History  Problem Relation Age of Onset   GI problems Father    Stroke Brother    Heart attack Mother    Hypotension Sister     Social History   Tobacco Use   Smoking status: Every Day    Packs/day: 1.00    Years: 30.00    Pack years: 30.00  Types: Cigarettes   Smokeless tobacco: Never   Tobacco comments:    1-2 pack day x 20 yrs.  Substance Use Topics   Alcohol use: No    Alcohol/week: 1.0 standard drink    Types: 1 Standard drinks or equivalent per week    Comment: one every 2 -3 months.   Drug use: No    Home Medications Prior to Admission medications   Medication Sig Start Date End Date Taking? Authorizing Provider  acyclovir (ZOVIRAX) 400 MG tablet Take 2 tablets (800 mg total) by mouth 5 (five) times daily. 05/07/21  Yes Genevive Bi F, PA  HYDROcodone-acetaminophen (NORCO/VICODIN) 5-325 MG tablet Take 1 tablet by mouth as needed for moderate pain. 05/07/21  Yes Genevive Bi F, PA  acetaminophen  (TYLENOL) 500 MG tablet Take 1 tablet (500 mg total) by mouth every 6 (six) hours as needed for mild pain. 04/12/15   Samuella Cota, MD  aspirin EC 81 MG tablet Take 1 tablet (81 mg total) by mouth daily. Resume in 1 week Patient taking differently: Take 81 mg by mouth daily.  02/25/15   Kathie Dike, MD  diphenhydrAMINE (SOMINEX) 25 MG tablet Take 25 mg by mouth at bedtime as needed for sleep.    [provider]  folic acid (FOLVITE) 1 MG tablet TAKE ONE TABLET BY MOUTH ONCE DAILY. 10/30/20   Rehman, Mechele Dawley, MD  lisinopril (PRINIVIL,ZESTRIL) 10 MG tablet Take 10 mg by mouth daily. 02/19/16   [provider]  Mesalamine (ASACOL) 400 MG CPDR DR capsule TAKE 2 CAPSULES BY MOUTH TWO TIMES DAILY. 10/20/19   Rehman, Mechele Dawley, MD  ondansetron (ZOFRAN-ODT) 4 MG disintegrating tablet Take 4 mg by mouth every 6 (six) hours as needed. 12/22/19   [provider]  sulfaSALAzine (AZULFIDINE) 500 MG tablet TAKE (2) TABLETS BY MOUTH TWICE DAILY. 10/30/20   Rogene Houston, MD    Allergies    Flagyl [metronidazole hcl] and Other  Review of Systems   Review of Systems  Constitutional:  Negative for fever.  Respiratory:  Negative for shortness of breath.   Cardiovascular:  Negative for chest pain.  Gastrointestinal:  Negative for abdominal pain, diarrhea, nausea and vomiting.  Genitourinary:  Positive for flank pain. Negative for dysuria.  Skin:  Positive for rash.  All other systems reviewed and are negative.  Physical Exam Updated Vital Signs BP 125/79 (BP Location: Right Arm)   Pulse 89   Temp 98.1 F (36.7 C) (Oral)   Resp 18   Wt 58.4 kg   SpO2 99%   BMI 22.80 kg/m   Physical Exam Constitutional:      General: She is not in acute distress.    Appearance: Normal appearance. She is not toxic-appearing.  HENT:     Head: Normocephalic.     Nose: Nose normal.     Comments: There is no sign of rash to patient nose    Mouth/Throat:     Mouth: Mucous membranes are  moist.  Eyes:     Extraocular Movements: Extraocular movements intact.     Pupils: Pupils are equal, round, and reactive to light.  Cardiovascular:     Rate and Rhythm: Normal rate and regular rhythm.  Pulmonary:     Effort: Pulmonary effort is normal.     Breath sounds: Normal breath sounds.  Abdominal:     General: Abdomen is flat.     Palpations: Abdomen is soft.     Tenderness: There is no abdominal tenderness.  Musculoskeletal:     Cervical back: Normal range of motion.  Skin:    General: Skin is warm and dry.     Capillary Refill: Capillary refill takes less than 2 seconds.     Findings: Rash present. Rash is papular and vesicular. Rash is not crusting.     Comments: Vesiculopapular rash present that originates underneath right breast and wraps around into patients back. Location of rash coincides with T5/T6 dermatome. Rash does not cross midline.   Neurological:     General: No focal deficit present.     Mental Status: She is alert.  Psychiatric:        Mood and Affect: Mood normal.    ED Results / Procedures / Treatments   Labs (all labs ordered are listed, but only abnormal results are displayed) Labs Reviewed - No data to display  EKG None  Radiology No results found.  Procedures Procedures   Medications Ordered in ED Medications - No data to display  ED Course  I have reviewed the triage vital signs and the nursing notes.  Pertinent labs & imaging results that were available during my care of the patient were reviewed by me and considered in my medical decision making (see chart for details).    MDM Rules/Calculators/A&P                          75 year old female presents with 1 day of rash that originated underneath her right breast and wraps around onto her back.  Patient states that on Sunday she began having prodromal tingling and burning in this area and continues that this morning she awoke with a rash to the same area.  On exam patient is  afebrile, not hypoxic, speaking full sentences.  This patient who presents with rash for the last 1 day.  This rash seems consistent with herpes zoster/shingles.  History and exam findings are absent of any red flag signs or findings such as tip of nose involvement (Hutchinson sign).  The rash does not appear urticarial so my suspicion for anaphylaxis is low.  This rash is vesiculopapular in nature.  This rash follows the T5/T6 dermatome and does not cross the midline.  The patient denies any prior vaccination against shingles.  The appearance of this rash is most consistent with herpes zoster/shingles.  This patient will be treated with 800 mg of acyclovir 5 times a day for 7 days.  I have also prescribed this patient 6 tablets of hydrocodone acetaminophen for any pain.  I explained to this patient that this is a pain medication that is likely to impair her judgment, and stated that she should not drive or operate heavy machinery while taking it.  I continued that she should attempt to only take it at night before bed in order to lessen the chance that she fall and hurt herself.  I explained that opioids have a high likelihood of causing constipation and encouraged her to purchase MiraLAX for any future constipation she may experience.  I told the patient that until the rash crusts over it is contagious.  I encouraged her to follow-up with her PCP in 7 days after completion of antiviral course to check for resolution of rash.  I also instructed patient that if she were to develop a rash on the tip of her nose then she needs to return immediately to the ED for further evaluation.  The patient is understanding of this plan and expresses  understanding of return precautions.  Patient is stable on discharge.  Final Clinical Impression(s) / ED Diagnoses Final diagnoses:  Shingles (herpes zoster) polyneuropathy    Rx / DC Orders ED Discharge Orders          Ordered    acyclovir (ZOVIRAX) 400 MG tablet  5  times daily        05/07/21 1916    HYDROcodone-acetaminophen (NORCO/VICODIN) 5-325 MG tablet  As needed        05/07/21 1916             Azucena Cecil, [place] 05/07/21 1949    Dorie Rank, MD 05/08/21 1625

## 2021-05-07 NOTE — ED Notes (Signed)
Pt verbalized understanding of dc instructions and scripts no questions or concerns at this time. Pt walk out of the department with family member with steady gait.

## 2021-05-07 NOTE — ED Triage Notes (Signed)
Pt with right flank pain and rash on mid right back that wraps around to under right breast.

## 2021-05-28 ENCOUNTER — Other Ambulatory Visit (INDEPENDENT_AMBULATORY_CARE_PROVIDER_SITE_OTHER): Payer: Self-pay | Admitting: Internal Medicine

## 2021-08-21 ENCOUNTER — Telehealth: Payer: Self-pay

## 2021-08-21 ENCOUNTER — Other Ambulatory Visit (INDEPENDENT_AMBULATORY_CARE_PROVIDER_SITE_OTHER): Payer: Self-pay | Admitting: Internal Medicine

## 2021-08-21 NOTE — Telephone Encounter (Signed)
Will refill medication for 3 months, needs follow up appointment with any provider in order to receive any refills. ? ?Thanks, ? ?Maylon Peppers, MD ?Gastroenterology and Hepatology ?[facility]; ?

## 2021-08-21 NOTE — Telephone Encounter (Signed)
[name], Per Dr. Jenetta Downer patient needs follow up appointment with any provider.  ?

## 2021-08-21 NOTE — Telephone Encounter (Signed)
Message sent to [name] to set patient up for visit. ?

## 2021-09-23 ENCOUNTER — Ambulatory Visit (INDEPENDENT_AMBULATORY_CARE_PROVIDER_SITE_OTHER): Payer: Medicare Other | Admitting: Gastroenterology

## 2021-10-15 ENCOUNTER — Ambulatory Visit (INDEPENDENT_AMBULATORY_CARE_PROVIDER_SITE_OTHER): Payer: Medicare Other | Admitting: Gastroenterology

## 2021-12-03 ENCOUNTER — Encounter (INDEPENDENT_AMBULATORY_CARE_PROVIDER_SITE_OTHER): Payer: Self-pay | Admitting: Gastroenterology

## 2021-12-03 ENCOUNTER — Ambulatory Visit (INDEPENDENT_AMBULATORY_CARE_PROVIDER_SITE_OTHER): Payer: Medicare Other | Admitting: Gastroenterology

## 2021-12-03 VITALS — BP 128/78 | HR 94 | Temp 97.6°F | Ht 63.0 in | Wt 125.3 lb

## 2021-12-03 DIAGNOSIS — K50019 Crohn's disease of small intestine with unspecified complications: Secondary | ICD-10-CM | POA: Diagnosis not present

## 2021-12-03 MED ORDER — FOLIC ACID 1 MG PO TABS: 1.0000 mg | ORAL_TABLET | Freq: Every day | ORAL | 2 refills | Status: DC

## 2021-12-03 MED ORDER — SULFASALAZINE 500 MG PO TABS: ORAL_TABLET | ORAL | 2 refills | Status: DC

## 2021-12-03 NOTE — Patient Instructions (Signed)
I am glad you are doing well I have sent refills of your sulfasalazine and folic acid We will check basic labs to ensure everything is looking okay Please let me know if you have any GI issues  Follow up 1 year

## 2021-12-03 NOTE — Progress Notes (Signed)
Referring Provider: Redmond School, MD Primary Care Physician:  Annette School, MD Primary GI Physician: [name]  Chief Complaint  Patient presents with   Follow-up    Patient here today for a follow up on her Crohn's. She denies any current Gi issues.   HPI:   [name] is a 76 y.o. female with past medical history of arthritis, Crohn's disease, DDD, HTN.   Patient presenting today for Small Bowel Crohn's disease follow up.  History: Last seen 01/18/20. Patient with hx of small bowel Crohn's disease status post right hemicolectomy in 2008 when she apparently had intra-abdominal abscess. At last visit was doing well on Sulfasalazine with 2-3 BMs per day loose-formed. Avoiding trigger foods. Denied rectal bleeding or abdominal pain. She had been on Delzicol but had to be switched over in 2021 because of coverage issues. Last CT in January 2017 did not reveal bowel wall thickening or dilation. Last CRP 10/2018 3.6. she declined Colonoscopy for endoscopic evaluation.   Present: Patient reports she is doing well. She states that she has 2-3 BMs per day ranging from solid to watery. She denies rectal bleeding or melena. She denies fevers or chills. No nausea or vomiting. She has no abdominal pain. Appetite is good. She states that she does a lot of her own cooking. She tries to avoid things like cornbread or chocolate as these cause her to have harder stools and make it more difficult for her to have a BM. She has not had any recent labs done. She is taking folic acid still, states it is $13 for 30 pills which she feels is a little expensive. She is not interested in any endoscopic evaluation of her disease.   Last Colonoscopy: ? Last Endoscopy: 02/24/15 Multiple gastric erosions/ulcerations. Punctate gastric hemorrhages (only latter felt to be related to NG tube trauma). Areas of extensive erosion and ulceration likely pre-existing. Differential diagnosis includes Crohn's disease, ischemia.  Would be quiteunusual to be related to the trauma heaving alone. These were not Cameron lesions. H. pylori could be in the "background". Based on interview with the family, there is no NSAID exposure aside from one aspirin daily.  Recommendations:    Past Medical History:  Diagnosis Date   Arthritis    Crohn's disease ([name])    Degenerative disc disease, lumbar    Hypertension    Sciatica of right side    Stroke [name])    Ventral hernia     Past Surgical History:  Procedure Laterality Date   CAROTID ENDARTERECTOMY     2004   CHOLECYSTECTOMY     2008   ESOPHAGOGASTRODUODENOSCOPY N/A 02/24/2015   Procedure: ESOPHAGOGASTRODUODENOSCOPY (EGD);  Surgeon: Annette Dolin, MD;  Location: [name];  Service: Endoscopy;  Laterality: N/A;   HEMICOLECTOMY     right   HIP ARTHROPLASTY Left 04/09/2015   Procedure: INSERT PARTIAL HIP REPLACEMENT LEFT;  Surgeon: Annette Civil, MD;  Location: [name];  Service: Orthopedics;  Laterality: Left;   INCISIONAL HERNIA REPAIR N/A 05/20/2013   Procedure: [name] HERNIORRHAPHY WITH MESH;  Surgeon: Annette So, MD;  Location: [name];  Service: General;  Laterality: N/A;   [name] N/A 05/19/2014   Procedure: RECURRENT HERNIA REPAIR INCISIONAL;  Surgeon: Annette So, MD;  Location: [name];  Service: General;  Laterality: N/A;   INSERTION OF MESH N/A 05/20/2013   Procedure: INSERTION OF MESH;  Surgeon: Annette So, MD;  Location: [name];  Service: General;  Laterality:  N/A;   INSERTION OF MESH N/A 05/19/2014   Procedure: INSERTION OF MESH;  Surgeon: Annette So, MD;  Location: [name];  Service: General;  Laterality: N/A;   TUBAL LIGATION      Current Outpatient Medications  Medication Sig Dispense Refill   acetaminophen (TYLENOL) 500 MG tablet Take 1 tablet (500 mg total) by mouth every 6 (six) hours as needed for mild pain.     aspirin EC 81 MG tablet Take 1 tablet (81 mg total) by mouth daily. Resume in 1 week (Patient  taking differently: Take 81 mg by mouth daily.)     diphenhydrAMINE (SOMINEX) 25 MG tablet Take 25 mg by mouth at bedtime as needed for sleep.     folic acid (FOLVITE) 1 MG tablet TAKE ONE TABLET BY MOUTH ONCE DAILY. 90 tablet 0   HYDROcodone-acetaminophen (NORCO/VICODIN) 5-325 MG tablet Take 1 tablet by mouth as needed for moderate pain. 6 tablet 0   lisinopril (PRINIVIL,ZESTRIL) 10 MG tablet Take 10 mg by mouth daily.     Mesalamine (ASACOL) 400 MG CPDR DR capsule TAKE 2 CAPSULES BY MOUTH TWO TIMES DAILY. 360 capsule 1   Omega-3 Fatty Acids (FISH OIL) 1000 MG CPDR Take by mouth daily at 6 (six) AM.     ondansetron (ZOFRAN-ODT) 4 MG disintegrating tablet Take 4 mg by mouth every 6 (six) hours as needed.     sulfaSALAzine (AZULFIDINE) 500 MG tablet TAKE (2) TABLETS BY MOUTH TWICE DAILY. 360 tablet 0   acyclovir (ZOVIRAX) 400 MG tablet Take 2 tablets (800 mg total) by mouth 5 (five) times daily. 70 tablet 0   No current facility-administered medications for this visit.    Allergies as of 12/03/2021 - Review Complete 12/03/2021  Allergen Reaction Noted   Flagyl [metronidazole hcl] Other (See Comments) 12/30/2010   Other  02/21/2015    Family History  Problem Relation Age of Onset   GI problems Father    Stroke Brother    Heart attack Mother    Hypotension Sister     Social History   Socioeconomic History   Marital status: Widowed    Spouse name: Not on file   Number of children: Not on file   Years of education: Not on file   Highest education level: Not on file  Occupational History   Not on file  Tobacco Use   Smoking status: Every Day    Packs/day: 1.50    Years: 30.00    Total pack years: 45.00    Types: Cigarettes   Smokeless tobacco: Never   Tobacco comments:    1-2 pack day x 20 yrs.  Vaping Use   Vaping Use: Never used  Substance and Sexual Activity   Alcohol use: No    Alcohol/week: 1.0 standard drink of alcohol    Types: 1 Standard drinks or equivalent per  week    Comment: one every 2 -3 months.   Drug use: No   Sexual activity: Not on file  Other Topics Concern   Not on file  Social History Narrative   Not on file   Social Determinants of Health   Financial Resource Strain: Not on file  Food Insecurity: Not on file  Transportation Needs: Not on file  Physical Activity: Not on file  Stress: Not on file  Social Connections: Not on file   Review of systems General: negative for malaise, night sweats, fever, chills, weight loss Neck: Negative for lumps, goiter, pain and significant neck swelling Resp:  Negative for cough, wheezing, dyspnea at rest CV: Negative for chest pain, leg swelling, palpitations, orthopnea GI: denies melena, hematochezia, nausea, vomiting, dysphagia, odyonophagia, early satiety or unintentional weight loss. +watery to solid stools MSK: Negative for joint pain or swelling, back pain, and muscle pain. Derm: Negative for itching or rash Psych: Denies depression, anxiety, memory loss, confusion. No homicidal or suicidal ideation.  Heme: Negative for prolonged bleeding, bruising easily, and swollen nodes. Endocrine: Negative for cold or heat intolerance, polyuria, polydipsia and goiter. Neuro: negative for tremor, gait imbalance, syncope and seizures. The remainder of the review of systems is noncontributory.  Physical Exam: BP 128/78 (BP Location: Left Arm, Patient Position: Sitting, Cuff Size: Small)   Pulse 94   Temp 97.6 F (36.4 C) (Oral)   Ht 5' 3"  (1.6 m)   Wt 125 lb 4.8 oz (56.8 kg)   BMI 22.20 kg/m  General:   Alert and oriented. No distress noted. Pleasant and cooperative.  Head:  Normocephalic and atraumatic. Eyes:  Conjuctiva clear without scleral icterus. Mouth:  Oral mucosa pink and moist. Good dentition. No lesions. Heart: Normal rate and rhythm, s1 and s2 heart sounds present.  Lungs: Clear lung sounds in all lobes. Respirations equal and unlabored. Abdomen:  +BS, soft, non-tender and  non-distended. No rebound or guarding. No HSM or masses noted. Derm: No palmar erythema or jaundice Msk:  Symmetrical without gross deformities. Normal posture. Extremities:  Without edema. Neurologic:  Alert and  oriented x4 Psych:  Alert and cooperative. Normal mood and affect.  Invalid input(s): "6 MONTHS"   ASSESSMENT: [name] is a 76 y.o. female presenting today for follow up of small bowel Crohn's disease.   Maintained on Sulfasalazine 1g BID, she has been maintained on this for the past few years and appears to be doing well on it. She has had no recent labs, no endoscopic evaluation of her Crohn's disease in the past and she Is not interested in a colonoscopy. Last CRP in 2020 was 3.6. she denies rectal bleeding, melena, abdominal pain, changes in appetite or weight loss. Continues to have 2-3 BMs per day that range from solid to watery depending what she eats. She avoids trigger foods. Discussed updating routine labs with her today to which she is hesitant but amenable. Will send refill of Sulfasalazine and folic acid. I offered a GoodRx card to help with cost of folic acid, however, patient was not interested in this. She will make me aware of any new or worsening GI symptoms.   PLAN:  CBC, CMP, CRP 2. Continue sulfasalazine and folic acid, refills sent  3. Pt to make me aware of new GI symptoms  All questions were answered, patient verbalized understanding and is in agreement with plan as outlined above.   Follow Up: 1 year  Mayur Duman L. Alver Sorrow, MSN, APRN, AGNP-C Adult-Gerontology Nurse Practitioner [facility]

## 2021-12-04 LAB — CBC WITH DIFFERENTIAL/PLATELET
Absolute Monocytes: 522 cells/uL (ref 200–950)
Basophils Absolute: 44 cells/uL (ref 0–200)
Basophils Relative: 0.5 %
Eosinophils Absolute: 122 cells/uL (ref 15–500)
Eosinophils Relative: 1.4 %
HCT: 33.9 % — ABNORMAL LOW (ref 35.0–45.0)
Hemoglobin: 11 g/dL — ABNORMAL LOW (ref 11.7–15.5)
Lymphs Abs: 1871 cells/uL (ref 850–3900)
MCH: 34 pg — ABNORMAL HIGH (ref 27.0–33.0)
MCHC: 32.4 g/dL (ref 32.0–36.0)
MCV: 104.6 fL — ABNORMAL HIGH (ref 80.0–100.0)
MPV: 8.8 fL (ref 7.5–12.5)
Monocytes Relative: 6 %
Neutro Abs: 6142 cells/uL (ref 1500–7800)
Neutrophils Relative %: 70.6 %
Platelets: 314 10*3/uL (ref 140–400)
RBC: 3.24 10*6/uL — ABNORMAL LOW (ref 3.80–5.10)
RDW: 11.3 % (ref 11.0–15.0)
Total Lymphocyte: 21.5 %
WBC: 8.7 10*3/uL (ref 3.8–10.8)

## 2021-12-04 LAB — COMPREHENSIVE METABOLIC PANEL
AG Ratio: 1.8 (calc) (ref 1.0–2.5)
ALT: 13 U/L (ref 6–29)
AST: 19 U/L (ref 10–35)
Albumin: 4.6 g/dL (ref 3.6–5.1)
Alkaline phosphatase (APISO): 53 U/L (ref 37–153)
BUN/Creatinine Ratio: 8 (calc) (ref 6–22)
BUN: 9 mg/dL (ref 7–25)
CO2: 24 mmol/L (ref 20–32)
Calcium: 9.8 mg/dL (ref 8.6–10.4)
Chloride: 101 mmol/L (ref 98–110)
Creat: 1.08 mg/dL — ABNORMAL HIGH (ref 0.60–1.00)
Globulin: 2.5 g/dL (calc) (ref 1.9–3.7)
Glucose, Bld: 99 mg/dL (ref 65–99)
Potassium: 4.9 mmol/L (ref 3.5–5.3)
Sodium: 135 mmol/L (ref 135–146)
Total Bilirubin: 0.4 mg/dL (ref 0.2–1.2)
Total Protein: 7.1 g/dL (ref 6.1–8.1)

## 2021-12-04 LAB — C-REACTIVE PROTEIN: CRP: 2.5 mg/L (ref <8.0)

## 2021-12-05 ENCOUNTER — Other Ambulatory Visit (INDEPENDENT_AMBULATORY_CARE_PROVIDER_SITE_OTHER): Payer: Self-pay | Admitting: Gastroenterology

## 2021-12-05 DIAGNOSIS — K50019 Crohn's disease of small intestine with unspecified complications: Secondary | ICD-10-CM

## 2021-12-05 DIAGNOSIS — D539 Nutritional anemia, unspecified: Secondary | ICD-10-CM

## 2021-12-06 ENCOUNTER — Other Ambulatory Visit (INDEPENDENT_AMBULATORY_CARE_PROVIDER_SITE_OTHER): Payer: Self-pay | Admitting: *Deleted

## 2021-12-06 DIAGNOSIS — D539 Nutritional anemia, unspecified: Secondary | ICD-10-CM

## 2021-12-06 DIAGNOSIS — K50019 Crohn's disease of small intestine with unspecified complications: Secondary | ICD-10-CM

## 2022-11-05 DIAGNOSIS — I10 Essential (primary) hypertension: Secondary | ICD-10-CM | POA: Diagnosis not present

## 2022-11-05 DIAGNOSIS — F17200 Nicotine dependence, unspecified, uncomplicated: Secondary | ICD-10-CM | POA: Diagnosis not present

## 2022-11-05 DIAGNOSIS — M159 Polyosteoarthritis, unspecified: Secondary | ICD-10-CM | POA: Diagnosis not present

## 2022-11-05 DIAGNOSIS — R5382 Chronic fatigue, unspecified: Secondary | ICD-10-CM | POA: Diagnosis not present

## 2022-11-05 DIAGNOSIS — E782 Mixed hyperlipidemia: Secondary | ICD-10-CM | POA: Diagnosis not present

## 2022-11-05 DIAGNOSIS — Z6823 Body mass index (BMI) 23.0-23.9, adult: Secondary | ICD-10-CM | POA: Diagnosis not present

## 2022-11-05 DIAGNOSIS — K5000 Crohn's disease of small intestine without complications: Secondary | ICD-10-CM | POA: Diagnosis not present

## 2022-11-05 DIAGNOSIS — R5383 Other fatigue: Secondary | ICD-10-CM | POA: Diagnosis not present

## 2022-11-05 DIAGNOSIS — Z0001 Encounter for general adult medical examination with abnormal findings: Secondary | ICD-10-CM | POA: Diagnosis not present

## 2022-11-13 ENCOUNTER — Other Ambulatory Visit (INDEPENDENT_AMBULATORY_CARE_PROVIDER_SITE_OTHER): Payer: Self-pay | Admitting: Gastroenterology

## 2022-11-13 NOTE — Telephone Encounter (Signed)
Patient will need office visit for further refills.

## 2022-12-08 ENCOUNTER — Ambulatory Visit (INDEPENDENT_AMBULATORY_CARE_PROVIDER_SITE_OTHER): Payer: Medicare Other | Admitting: Gastroenterology

## 2023-01-06 DIAGNOSIS — E538 Deficiency of other specified B group vitamins: Secondary | ICD-10-CM | POA: Diagnosis not present

## 2023-01-06 DIAGNOSIS — Z6822 Body mass index (BMI) 22.0-22.9, adult: Secondary | ICD-10-CM | POA: Diagnosis not present

## 2023-01-06 DIAGNOSIS — D638 Anemia in other chronic diseases classified elsewhere: Secondary | ICD-10-CM | POA: Diagnosis not present

## 2023-01-06 DIAGNOSIS — D510 Vitamin B12 deficiency anemia due to intrinsic factor deficiency: Secondary | ICD-10-CM | POA: Diagnosis not present

## 2023-01-15 ENCOUNTER — Other Ambulatory Visit: Payer: Self-pay

## 2023-01-15 ENCOUNTER — Emergency Department (HOSPITAL_COMMUNITY): Payer: Medicare Other

## 2023-01-15 ENCOUNTER — Encounter (HOSPITAL_COMMUNITY): Payer: Self-pay | Admitting: *Deleted

## 2023-01-15 ENCOUNTER — Emergency Department (HOSPITAL_COMMUNITY)
Admission: EM | Admit: 2023-01-15 | Discharge: 2023-01-15 | Disposition: A | Discharge status: Home / Self Care | Payer: Medicare Other | Attending: Student | Admitting: Student

## 2023-01-15 DIAGNOSIS — Z79899 Other long term (current) drug therapy: Secondary | ICD-10-CM | POA: Diagnosis not present

## 2023-01-15 DIAGNOSIS — R5381 Other malaise: Secondary | ICD-10-CM | POA: Diagnosis not present

## 2023-01-15 DIAGNOSIS — M545 Low back pain, unspecified: Secondary | ICD-10-CM | POA: Diagnosis not present

## 2023-01-15 DIAGNOSIS — Z7982 Long term (current) use of aspirin: Secondary | ICD-10-CM | POA: Diagnosis not present

## 2023-01-15 DIAGNOSIS — I10 Essential (primary) hypertension: Secondary | ICD-10-CM | POA: Insufficient documentation

## 2023-01-15 DIAGNOSIS — E876 Hypokalemia: Secondary | ICD-10-CM | POA: Diagnosis not present

## 2023-01-15 DIAGNOSIS — M1611 Unilateral primary osteoarthritis, right hip: Secondary | ICD-10-CM | POA: Diagnosis not present

## 2023-01-15 DIAGNOSIS — M25552 Pain in left hip: Secondary | ICD-10-CM | POA: Diagnosis not present

## 2023-01-15 DIAGNOSIS — M5136 Other intervertebral disc degeneration, lumbar region: Secondary | ICD-10-CM | POA: Diagnosis not present

## 2023-01-15 DIAGNOSIS — M47816 Spondylosis without myelopathy or radiculopathy, lumbar region: Secondary | ICD-10-CM | POA: Diagnosis not present

## 2023-01-15 HISTORY — DX: Anemia, unspecified: D64.9

## 2023-01-15 LAB — CBC
HCT: 31.8 % — ABNORMAL LOW (ref 36.0–46.0)
Hemoglobin: 10.6 g/dL — ABNORMAL LOW (ref 12.0–15.0)
MCH: 33.7 pg (ref 26.0–34.0)
MCHC: 33.3 g/dL (ref 30.0–36.0)
MCV: 101 fL — ABNORMAL HIGH (ref 80.0–100.0)
Platelets: 185 10*3/uL (ref 150–400)
RBC: 3.15 MIL/uL — ABNORMAL LOW (ref 3.87–5.11)
RDW: 13.1 % (ref 11.5–15.5)
WBC: 7.1 10*3/uL (ref 4.0–10.5)
nRBC: 0 % (ref 0.0–0.2)

## 2023-01-15 LAB — BASIC METABOLIC PANEL
Anion gap: 9 (ref 5–15)
BUN: 9 mg/dL (ref 8–23)
CO2: 25 mmol/L (ref 22–32)
Calcium: 9.1 mg/dL (ref 8.9–10.3)
Chloride: 101 mmol/L (ref 98–111)
Creatinine, Ser: 1.01 mg/dL — ABNORMAL HIGH (ref 0.44–1.00)
GFR, Estimated: 57 mL/min — ABNORMAL LOW (ref >60)
Glucose, Bld: 99 mg/dL (ref 70–99)
Potassium: 2.9 mmol/L — ABNORMAL LOW (ref 3.5–5.1)
Sodium: 135 mmol/L (ref 135–145)

## 2023-01-15 MED ORDER — POTASSIUM CHLORIDE 10 MEQ/100ML IV SOLN
10.0000 meq | Freq: Once | INTRAVENOUS | Status: AC
  Administered 2023-01-15: 10 meq via INTRAVENOUS
  Filled 2023-01-15: qty 100

## 2023-01-15 MED ORDER — POTASSIUM CHLORIDE CRYS ER 20 MEQ PO TBCR: 20.0000 meq | EXTENDED_RELEASE_TABLET | Freq: Two times a day (BID) | ORAL | 0 refills | Status: DC

## 2023-01-15 MED ORDER — POTASSIUM CHLORIDE CRYS ER 20 MEQ PO TBCR
40.0000 meq | EXTENDED_RELEASE_TABLET | Freq: Once | ORAL | Status: AC
  Administered 2023-01-15: 40 meq via ORAL
  Filled 2023-01-15: qty 2

## 2023-01-15 NOTE — ED Triage Notes (Signed)
Pt states she fell in May and has been having left hip problems since  Pt states her foot is "jumping"

## 2023-01-15 NOTE — ED Provider Notes (Signed)
[place] EMERGENCY DEPARTMENT AT [facility] Provider Note   CSN: [id number] Arrival date & time: 01/15/23  1030     History  Chief Complaint  Patient presents with   Hip Pain    [name] is a 77 y.o. female with history of Crohn's disease, hypertension, CVA and undergoing B12 injections for recently diagnosed anemia presenting for evaluation of a left hip concerns since falling on April 30.  She had left hip arthroplasty in 2016, tripped and fell in April and initially had significant pain and bruising.  The pain and bruising is improved but not completely resolved but also has noticed shaking and tremor in this leg with ambulation.  This symptom is not persistent, it comes and goes but seems to be worse this week.  She walks with a walker at baseline, but states when her leg starts tremoring family have to help her sit so she does not fall.  She denies urinary fecal incontinence or retention.  She denies numbness in her extremities.  The history is provided by the patient, a relative and a caregiver (daughter at bedside with whom pt lives).       Home Medications Prior to Admission medications   Medication Sig Start Date End Date Taking? Authorizing Provider  acetaminophen (TYLENOL) 500 MG tablet Take 1 tablet (500 mg total) by mouth every 6 (six) hours as needed for mild pain. 04/12/15   Standley Brooking, MD  acyclovir (ZOVIRAX) 400 MG tablet Take 2 tablets (800 mg total) by mouth 5 (five) times daily. 05/07/21   Al Decant, PA-C  aspirin EC 81 MG tablet Take 1 tablet (81 mg total) by mouth daily. Resume in 1 week Patient taking differently: Take 81 mg by mouth daily. 02/25/15   Erick Blinks, MD  diphenhydrAMINE (SOMINEX) 25 MG tablet Take 25 mg by mouth at bedtime as needed for sleep.    [provider]  folic acid (FOLVITE) 1 MG tablet TAKE ONE TABLET BY MOUTH ONCE DAILY. 11/13/22   Carlan, Chelsea L, NP  HYDROcodone-acetaminophen  (NORCO/VICODIN) 5-325 MG tablet Take 1 tablet by mouth as needed for moderate pain. 05/07/21   Al Decant, PA-C  lisinopril (PRINIVIL,ZESTRIL) 10 MG tablet Take 10 mg by mouth daily. 02/19/16   [provider]  Omega-3 Fatty Acids (FISH OIL) 1000 MG CPDR Take by mouth daily at 6 (six) AM.    [provider]  ondansetron (ZOFRAN-ODT) 4 MG disintegrating tablet Take 4 mg by mouth every 6 (six) hours as needed. 12/22/19   [provider]  sulfaSALAzine (AZULFIDINE) 500 MG tablet TAKE (2) TABLETS BY MOUTH TWICE DAILY. 11/13/22   Raquel James, NP      Allergies    Flagyl [metronidazole hcl] and Other    Review of Systems   Review of Systems  Constitutional:  Negative for fever.  HENT:  Negative for congestion.   Eyes: Negative.   Respiratory:  Negative for chest tightness and shortness of breath.   Cardiovascular:  Negative for chest pain.  Gastrointestinal:  Negative for abdominal pain, nausea and vomiting.  Genitourinary: Negative.   Musculoskeletal:  Positive for arthralgias. Negative for joint swelling and neck pain.  Skin: Negative.  Negative for rash and wound.  Neurological:  Positive for tremors. Negative for dizziness, weakness, light-headedness, numbness and headaches.  Psychiatric/Behavioral: Negative.    All other systems reviewed and are negative.   Physical Exam Updated Vital Signs BP (!) 143/67 (BP Location: Right Arm)  Pulse 80   Temp 98 F (36.7 C) (Oral)   Resp 18   Ht 5\' 5"  (1.651 m)   Wt 49.4 kg   SpO2 98%   BMI 18.14 kg/m  Physical Exam Vitals and nursing note reviewed.  Constitutional:      Appearance: She is well-developed.  HENT:     Head: Normocephalic and atraumatic.  Eyes:     General: No visual field deficit.    Conjunctiva/sclera: Conjunctivae normal.  Cardiovascular:     Rate and Rhythm: Normal rate and regular rhythm.     Heart sounds: Normal heart sounds.  Pulmonary:     Effort: Pulmonary effort is  normal.     Breath sounds: Normal breath sounds. No wheezing.  Abdominal:     General: Bowel sounds are normal.     Palpations: Abdomen is soft.     Tenderness: There is no abdominal tenderness.  Musculoskeletal:        General: Normal range of motion.     Cervical back: Normal range of motion.  Skin:    General: Skin is warm and dry.  Neurological:     Mental Status: She is alert and oriented to person, place, and time.     Cranial Nerves: No cranial nerve deficit, dysarthria or facial asymmetry.     Sensory: No sensory deficit.     Motor: Tremor present.     Coordination: Coordination normal.     Comments: Mild tremor noted with heel to shin maneuver with the left leg only but can complete.  Equal grip strength.  No pronator drift.      ED Results / Procedures / Treatments   Labs (all labs ordered are listed, but only abnormal results are displayed) Labs Reviewed  CBC - Abnormal; Notable for the following components:      Result Value   RBC 3.15 (*)    Hemoglobin 10.6 (*)    HCT 31.8 (*)    MCV 101.0 (*)    All other components within normal limits  BASIC METABOLIC PANEL - Abnormal; Notable for the following components:   Potassium 2.9 (*)    Creatinine, Ser 1.01 (*)    GFR, Estimated 57 (*)    All other components within normal limits    EKG None  Radiology DG Hip Unilat W or Wo Pelvis 2-3 Views Left  Result Date: 01/15/2023 CLINICAL DATA:  Low back and left hip pain since falling in May. EXAM: DG HIP (WITH OR WITHOUT PELVIS) 2-3V LEFT; LUMBAR SPINE - COMPLETE 4+ VIEW COMPARISON:  Lumbar spine radiographs 03/13/2010, abdominopelvic CT 07/07/2015 and hip radiographs 03/14/2016 and 02/10/2017. FINDINGS: Lumbar spine: Transitional lumbosacral anatomy with a largely sacralized L5 segment. Progressive convex right thoracolumbar scoliosis centered at the thoracolumbar junction. Assessment of the lateral alignment is limited by the scoliosis and obliquity on the lateral view.  There may be a mild retrolisthesis at L2-3. There is multilevel spondylosis with disc space narrowing, endplate osteophytes and facet hypertrophy. No evidence of acute fracture or pars defect. Cholecystectomy clips, diffuse aortoiliac atherosclerosis and sequela of chronic calcific pancreatitis noted. Left hip: Stable postsurgical changes from previous left hip bipolar hemiarthroplasty. The hardware appears intact without evidence of loosening. Unchanged heterotopic calcification adjacent to the left greater trochanter. Unchanged mild right hip and sacroiliac degenerative changes. No evidence of acute fracture or dislocation. IMPRESSION: 1. No evidence of acute lumbar spine or left hip fracture. 2. Progressive convex right thoracolumbar scoliosis and multilevel spondylosis. 3. Stable postsurgical changes  from previous left hip bipolar hemiarthroplasty. Electronically Signed   By: Carey Bullocks M.D.   On: 01/15/2023 12:51   DG Lumbar Spine Complete  Result Date: 01/15/2023 CLINICAL DATA:  Low back and left hip pain since falling in May. EXAM: DG HIP (WITH OR WITHOUT PELVIS) 2-3V LEFT; LUMBAR SPINE - COMPLETE 4+ VIEW COMPARISON:  Lumbar spine radiographs 03/13/2010, abdominopelvic CT 07/07/2015 and hip radiographs 03/14/2016 and 02/10/2017. FINDINGS: Lumbar spine: Transitional lumbosacral anatomy with a largely sacralized L5 segment. Progressive convex right thoracolumbar scoliosis centered at the thoracolumbar junction. Assessment of the lateral alignment is limited by the scoliosis and obliquity on the lateral view. There may be a mild retrolisthesis at L2-3. There is multilevel spondylosis with disc space narrowing, endplate osteophytes and facet hypertrophy. No evidence of acute fracture or pars defect. Cholecystectomy clips, diffuse aortoiliac atherosclerosis and sequela of chronic calcific pancreatitis noted. Left hip: Stable postsurgical changes from previous left hip bipolar hemiarthroplasty. The hardware  appears intact without evidence of loosening. Unchanged heterotopic calcification adjacent to the left greater trochanter. Unchanged mild right hip and sacroiliac degenerative changes. No evidence of acute fracture or dislocation. IMPRESSION: 1. No evidence of acute lumbar spine or left hip fracture. 2. Progressive convex right thoracolumbar scoliosis and multilevel spondylosis. 3. Stable postsurgical changes from previous left hip bipolar hemiarthroplasty. Electronically Signed   By: Carey Bullocks M.D.   On: 01/15/2023 12:51    Procedures Procedures    Medications Ordered in ED Medications  potassium chloride 10 mEq in 100 mL IVPB (10 mEq Intravenous New Bag/Given 01/15/23 1241)  potassium chloride SA (KLOR-CON M) CR tablet 40 mEq (40 mEq Oral Given 01/15/23 1233)    ED Course/ Medical Decision Making/ A&P                                 Medical Decision Making Patient presenting with slowly worsening lower extremity weakness since she fell at the end of April, landing on her left prosthetic hip, pain is nearly resolved in the hip joint itself.  Describing a tremor in her left leg which is intermittent, worsening with prolonged standing, uses a walker at baseline.  Her imaging and labs today are reassuring although she does have a moderate hypokalemia at 2.9, this is not consistent with unilateral symptoms as she describes.  However she is given IV potassium along with oral and a prescription for additional potassium to take for the next 7 days.  Her exam and history suggests muscle deconditioning who would benefit from physical therapy.  A face-to-face referral for home PT has been ordered.  She is encouraged to use her walker at all times when weightbearing.  Return precautions outlined.  Amount and/or Complexity of Data Reviewed Labs: ordered.    Details: Potassium is 2.9, hemoglobin is 10.6 which is chronic and stable. Radiology: ordered.    Details: Mojeed reviewed, no acute findings in  the left hip nor lumbar spine.  Risk Prescription drug management.           Final Clinical Impression(s) / ED Diagnoses Final diagnoses:  Left hip pain  Physical deconditioning    Rx / DC Orders ED Discharge Orders          Ordered    Ambulatory referral to Physical Therapy        01/15/23 1321              Burgess Amor, PA-C 01/15/23 1400  Glendora Score, MD 01/15/23 [phone]

## 2023-01-15 NOTE — ED Notes (Signed)
Patient transported to X-ray 

## 2023-01-15 NOTE — Discharge Instructions (Signed)
Your labs and your x-rays are reassuring today with no obvious hip injury or other reasons which would explain your leg weakness.  I suspect you may have muscle deconditioning which can make you weak and tremulous.  I have referred you for physical therapy.  Expect a call from them to arrange an appointment.

## 2023-01-21 ENCOUNTER — Telehealth: Payer: Self-pay

## 2023-01-21 NOTE — Telephone Encounter (Signed)
Transition Care Management Follow-up Telephone Call Date of discharge and from where: 01/15/2023 [facility] How have you been since you were released from the hospital? Patient stated she is not feeling any better her left foot still feels heavy and she is having difficulty ambulating. Any questions or concerns? No  Items Reviewed: Did the pt receive and understand the discharge instructions provided? Yes  Medications obtained and verified? Yes  Other? No  Any new allergies since your discharge? No  Dietary orders reviewed? Yes Do you have support at home? Yes   Follow up appointments reviewed:  PCP Hospital f/u appt confirmed? Yes  Scheduled to see Assunta Found, MD on 02/05/2023 @ [facility]. Specialist Hospital f/u appt confirmed? No  Scheduled to see  on  @ . Are transportation arrangements needed?  Patient consented to [facility] referral to [facility] transportation as backup when needed. If their condition worsens, is the pt aware to call PCP or go to the Emergency Dept.? Yes Was the patient provided with contact information for the PCP's office or ED? Yes Was to pt encouraged to call back with questions or concerns? Yes  Mechel Schutter [facility]  [facility] Population Health Community Resource Care Guide   [email or web address]  ?? [phone]   Website: [email or web address]  [email or web address]

## 2023-01-24 ENCOUNTER — Encounter (HOSPITAL_COMMUNITY): Payer: Self-pay

## 2023-01-24 ENCOUNTER — Emergency Department (HOSPITAL_COMMUNITY)
Admission: EM | Admit: 2023-01-24 | Discharge: 2023-01-24 | Disposition: A | Discharge status: Home / Self Care | Payer: Medicare Other | Attending: Emergency Medicine | Admitting: Emergency Medicine

## 2023-01-24 ENCOUNTER — Emergency Department (HOSPITAL_COMMUNITY): Payer: Medicare Other

## 2023-01-24 DIAGNOSIS — I10 Essential (primary) hypertension: Secondary | ICD-10-CM | POA: Diagnosis not present

## 2023-01-24 DIAGNOSIS — Z7982 Long term (current) use of aspirin: Secondary | ICD-10-CM | POA: Insufficient documentation

## 2023-01-24 DIAGNOSIS — R251 Tremor, unspecified: Secondary | ICD-10-CM | POA: Insufficient documentation

## 2023-01-24 DIAGNOSIS — Z79899 Other long term (current) drug therapy: Secondary | ICD-10-CM | POA: Insufficient documentation

## 2023-01-24 DIAGNOSIS — G459 Transient cerebral ischemic attack, unspecified: Secondary | ICD-10-CM | POA: Diagnosis not present

## 2023-01-24 DIAGNOSIS — R531 Weakness: Secondary | ICD-10-CM | POA: Insufficient documentation

## 2023-01-24 DIAGNOSIS — R5383 Other fatigue: Secondary | ICD-10-CM | POA: Diagnosis not present

## 2023-01-24 DIAGNOSIS — R5381 Other malaise: Secondary | ICD-10-CM | POA: Diagnosis not present

## 2023-01-24 DIAGNOSIS — J449 Chronic obstructive pulmonary disease, unspecified: Secondary | ICD-10-CM | POA: Diagnosis not present

## 2023-01-24 LAB — URINALYSIS, ROUTINE W REFLEX MICROSCOPIC
Bilirubin Urine: NEGATIVE
Glucose, UA: NEGATIVE mg/dL
Hgb urine dipstick: NEGATIVE
Ketones, ur: NEGATIVE mg/dL
Leukocytes,Ua: NEGATIVE
Nitrite: NEGATIVE
Protein, ur: NEGATIVE mg/dL
Specific Gravity, Urine: 1.004 — ABNORMAL LOW (ref 1.005–1.030)
pH: 6 (ref 5.0–8.0)

## 2023-01-24 LAB — CBC
HCT: 32.2 % — ABNORMAL LOW (ref 36.0–46.0)
Hemoglobin: 10.7 g/dL — ABNORMAL LOW (ref 12.0–15.0)
MCH: 33.9 pg (ref 26.0–34.0)
MCHC: 33.2 g/dL (ref 30.0–36.0)
MCV: 101.9 fL — ABNORMAL HIGH (ref 80.0–100.0)
Platelets: 203 10*3/uL (ref 150–400)
RBC: 3.16 MIL/uL — ABNORMAL LOW (ref 3.87–5.11)
RDW: 12.9 % (ref 11.5–15.5)
WBC: 8.3 10*3/uL (ref 4.0–10.5)
nRBC: 0 % (ref 0.0–0.2)

## 2023-01-24 LAB — BASIC METABOLIC PANEL
Anion gap: 9 (ref 5–15)
BUN: 10 mg/dL (ref 8–23)
CO2: 25 mmol/L (ref 22–32)
Calcium: 9.1 mg/dL (ref 8.9–10.3)
Chloride: 99 mmol/L (ref 98–111)
Creatinine, Ser: 1.1 mg/dL — ABNORMAL HIGH (ref 0.44–1.00)
GFR, Estimated: 51 mL/min — ABNORMAL LOW (ref >60)
Glucose, Bld: 99 mg/dL (ref 70–99)
Potassium: 4.1 mmol/L (ref 3.5–5.1)
Sodium: 133 mmol/L — ABNORMAL LOW (ref 135–145)

## 2023-01-24 LAB — CBG MONITORING, ED: Glucose-Capillary: 98 mg/dL (ref 70–99)

## 2023-01-24 NOTE — ED Notes (Signed)
Transported to CT 

## 2023-01-24 NOTE — ED Notes (Signed)
Spoke with sister Clydie Braun she will be here in 10 minutes to pick the pt up.

## 2023-01-24 NOTE — ED Triage Notes (Signed)
Pt was seen 9 days ago for a fall and is back today due to weakness that has been going on for a few weeks now. Pt stated that she walks with a walker at home, but has been "feeling unsteady" which has lead to less activity. Pt also stated that she has not showered for a while because she is scared she is going to fall. Pt seems more afraid of falling than anything.

## 2023-01-24 NOTE — ED Provider Notes (Signed)
[place] EMERGENCY DEPARTMENT AT [facility] Provider Note   CSN: [id number] Arrival date & time: 01/24/23  1319     History  Chief Complaint  Patient presents with   Weakness    [name] is a 77 y.o. female with a history including Crohn's disease, hypertension, CVA, pernicious anemia and chronic left hip pain since a fall in April, increasing weakness and ability to ambulate in her home which has been worsening over the past several months.  She describes as shaking tremor in her legs which is worsened with ambulation.  She describes ambulating using her walker just a few steps after which her family has to help her because she starts shaking and getting really weak.  She denies fevers or chills, chest pain, shortness of breath, dysuria, back pain, abdominal pain.  She was seen here by me on August 1 with similar complaint at which time her workup was unremarkable, however she was felt to be deconditioned with generalized muscle weakness.  We discussed placement versus going home with plans for home PT evaluation for conditioning which patient was most agreeable with.  She states that nobody ever called her about this.  She does state that she has been admitted to the [facility] before for rehab and is desirous of this at this time.  She denies focal weakness or numbness in her extremities.  The history is provided by the patient.       Home Medications Prior to Admission medications   Medication Sig Start Date End Date Taking? Authorizing Provider  acetaminophen (TYLENOL) 500 MG tablet Take 1 tablet (500 mg total) by mouth every 6 (six) hours as needed for mild pain. 04/12/15   Standley Brooking, MD  acyclovir (ZOVIRAX) 400 MG tablet Take 2 tablets (800 mg total) by mouth 5 (five) times daily. 05/07/21   Al Decant, PA-C  aspirin EC 81 MG tablet Take 1 tablet (81 mg total) by mouth daily. Resume in 1 week Patient taking differently: Take 81 mg by mouth  daily. 02/25/15   Erick Blinks, MD  diphenhydrAMINE (SOMINEX) 25 MG tablet Take 25 mg by mouth at bedtime as needed for sleep.    [provider]  folic acid (FOLVITE) 1 MG tablet TAKE ONE TABLET BY MOUTH ONCE DAILY. 11/13/22   Carlan, Chelsea L, NP  HYDROcodone-acetaminophen (NORCO/VICODIN) 5-325 MG tablet Take 1 tablet by mouth as needed for moderate pain. 05/07/21   Al Decant, PA-C  lisinopril (PRINIVIL,ZESTRIL) 10 MG tablet Take 10 mg by mouth daily. 02/19/16   [provider]  Omega-3 Fatty Acids (FISH OIL) 1000 MG CPDR Take by mouth daily at 6 (six) AM.    [provider]  ondansetron (ZOFRAN-ODT) 4 MG disintegrating tablet Take 4 mg by mouth every 6 (six) hours as needed. 12/22/19   [provider]  potassium chloride SA (KLOR-CON M) 20 MEQ tablet Take 1 tablet (20 mEq total) by mouth 2 (two) times daily. 01/15/23   Burgess Amor, PA-C  sulfaSALAzine (AZULFIDINE) 500 MG tablet TAKE (2) TABLETS BY MOUTH TWICE DAILY. 11/13/22   Raquel James, NP      Allergies    Flagyl [metronidazole hcl] and Other    Review of Systems   Review of Systems  Constitutional:  Negative for chills and fever.  HENT:  Negative for congestion and sore throat.   Eyes: Negative.   Respiratory:  Negative for chest tightness and shortness of breath.   Cardiovascular:  Negative for chest pain.  Gastrointestinal:  Negative for abdominal pain and nausea.  Genitourinary: Negative.   Musculoskeletal:  Positive for gait problem. Negative for arthralgias, joint swelling and neck pain.  Skin: Negative.  Negative for rash and wound.  Neurological:  Positive for tremors and weakness. Negative for dizziness, light-headedness, numbness and headaches.  Psychiatric/Behavioral: Negative.    All other systems reviewed and are negative.   Physical Exam Updated Vital Signs BP 112/72   Pulse 91   Temp 98.6 F (37 C)   Resp 19   Ht 5\' 5"  (1.651 m)   Wt 49.4 kg   SpO2 99%   BMI  18.12 kg/m  Physical Exam Vitals and nursing note reviewed.  Constitutional:      Appearance: She is well-developed. She is cachectic. She is not toxic-appearing.  HENT:     Head: Normocephalic and atraumatic.  Eyes:     Conjunctiva/sclera: Conjunctivae normal.  Cardiovascular:     Rate and Rhythm: Normal rate and regular rhythm.     Heart sounds: Normal heart sounds.  Pulmonary:     Effort: Pulmonary effort is normal.     Breath sounds: Normal breath sounds. No wheezing.  Abdominal:     General: Bowel sounds are normal.     Palpations: Abdomen is soft.     Tenderness: There is no abdominal tenderness.  Musculoskeletal:        General: Normal range of motion.     Cervical back: Normal range of motion.  Skin:    General: Skin is warm and dry.  Neurological:     General: No focal deficit present.     Mental Status: She is alert.     Motor: Weakness present.     Comments: General weakness but equal bilaterally. No pronator drift.  Needed assistance with transfer to bedpan and back to bed.   Psychiatric:        Behavior: Behavior is cooperative.     ED Results / Procedures / Treatments   Labs (all labs ordered are listed, but only abnormal results are displayed) Labs Reviewed  BASIC METABOLIC PANEL - Abnormal; Notable for the following components:      Result Value   Sodium 133 (*)    Creatinine, Ser 1.10 (*)    GFR, Estimated 51 (*)    All other components within normal limits  CBC - Abnormal; Notable for the following components:   RBC 3.16 (*)    Hemoglobin 10.7 (*)    HCT 32.2 (*)    MCV 101.9 (*)    All other components within normal limits  URINALYSIS, ROUTINE W REFLEX MICROSCOPIC - Abnormal; Notable for the following components:   Specific Gravity, Urine 1.004 (*)    All other components within normal limits  CBG MONITORING, ED    EKG EKG Interpretation Date/Time:  Saturday January 24 2023 15:45:52 EDT Ventricular Rate:  79 PR Interval:  157 QRS  Duration:  82 QT Interval:  372 QTC Calculation: 427 R Axis:   42  Text Interpretation: Sinus rhythm Abnormal R-wave progression, early transition Confirmed by Eber Hong ([id number]) on 01/24/2023 3:55:03 PM  Radiology CT Head Wo Contrast  Result Date: 01/24/2023 CLINICAL DATA:  Transient ischemic attack. Unsteady. Weakness. Recent fall. EXAM: CT HEAD WITHOUT CONTRAST TECHNIQUE: Contiguous axial images were obtained from the base of the skull through the vertex without intravenous contrast. RADIATION DOSE REDUCTION: This exam was performed according to the departmental dose-optimization program which includes automated exposure control, adjustment  of the mA and/or kV according to patient size and/or use of iterative reconstruction technique. COMPARISON:  04/09/2015 FINDINGS: Brain: No evidence of intracranial hemorrhage, acute infarction, hydrocephalus, extra-axial collection, or mass lesion/mass effect. Moderate to severe diffuse cerebral and cerebellar atrophy shows mild increase since prior study. Old left posterior parietal infarct again noted. Moderate chronic small vessel disease again demonstrated. Vascular:  No hyperdense vessel or other acute findings. Skull: No evidence of fracture or other significant bone abnormality. Sinuses/Orbits:  No acute findings. Other: None. IMPRESSION: No acute intracranial abnormality. Diffuse cerebral atrophy, chronic small vessel disease, and old left posterior parietal infarct. Electronically Signed   By: Danae Orleans M.D.   On: 01/24/2023 16:27   DG Chest Portable 1 View  Result Date: 01/24/2023 CLINICAL DATA:  Weakness and fatigue. EXAM: PORTABLE CHEST 1 VIEW COMPARISON:  One-view chest x-ray 04/09/2015 FINDINGS: Heart size is normal. Atherosclerotic changes are present at the aortic arch. Changes of COPD are noted. No focal airspace disease is present. No edema or effusion is present. The visualized soft tissues and bony thorax are unremarkable. IMPRESSION: 1.  No acute cardiopulmonary disease. 2. COPD. Electronically Signed   By: Marin Roberts M.D.   On: 01/24/2023 15:13    Procedures Procedures    Medications Ordered in ED Medications - No data to display  ED Course/ Medical Decision Making/ A&P                                 Medical Decision Making Patient presenting once again with complaint of generalized weakness, stating she has difficulty ambulating using her walker after several steps she starts to get shaky in the legs and has to have assistance to sit down.  She denies any focal weakness although she states her left leg gets more shaky than her right, she also notices some tremor in her hands as well.  Differential diagnosis including general deconditioning which was what was felt to be the source of her symptoms at her last visit here.  Other possibilities include new CVA although her exam today is nonfocal.  This could also represent electrolyte abnormality, dehydration.  Labs have been obtained CT imaging obtained to rule out these other possibilities.  At reevaluation, although patient initially stated she would be willing to go to rehab and likes the [facility], she has decided she would rather go home and try to get home physical therapy.  This has been reordered, hopefully she will be receiving home evaluation with the next several days.  She has been given a prescription for a wheelchair.    Amount and/or Complexity of Data Reviewed Labs: ordered.    Details: Labs reviewed, her be met is 133, she has a normal WBC count at 8.3 her hemoglobin is 10.7, UA is negative.  Of note hemoglobin is stable. Radiology: ordered.    Details: CT head is negative for acute or subacute CVA.  Risk Decision regarding hospitalization.           Final Clinical Impression(s) / ED Diagnoses Final diagnoses:  Generalized weakness  Physical deconditioning    Rx / DC Orders ED Discharge Orders          Ordered    Home Health         01/24/23 1833    Face-to-face encounter (required for Medicare/Medicaid patients)       Comments: I Burgess Amor certify that this patient is under  my care and that I, or a nurse practitioner or physician's assistant working with me, had a face-to-face encounter that meets the physician face-to-face encounter requirements with this patient on 01/24/2023. The encounter with the patient was in whole, or in part for the following medical condition(s) which is the primary reason for home health care (List medical condition): generalized weakness and deconditioning.   01/24/23 1833    Walker standard        01/24/23 1834              Burgess Amor, PA-C 01/24/23 Shirlean Mylar, MD 01/25/23 [phone]

## 2023-01-24 NOTE — ED Notes (Signed)
Pt on bedside toilet, assisted off toilet to standing position, pt able to wipe self, pull pants up, assisted with getting back in bed, pt has call bell in hand, instructions on how to operate if she needs anything instead of yelling, pt verbalized understanding. Pt on monitor.

## 2023-01-24 NOTE — Discharge Instructions (Signed)
As discussed I think he will benefit from physical therapy.  This was ordered at your last emergency room visit, for some reason this did not happen, however I have ordered it in a different way this time, you should now be receiving a phone call for visit to your home to assess for physical therapy needs.  In the interim I am giving you a prescription for a wheelchair to help you in your home since you have difficulty using your walker currently.

## 2023-01-24 NOTE — ED Notes (Signed)
Pt had a small skin tear on her left elbow that happened during transfer from CT bed back to stretcher. Wound has been cleans and dressed.

## 2023-01-25 ENCOUNTER — Telehealth: Payer: Self-pay

## 2023-01-25 NOTE — Telephone Encounter (Signed)
Reached out to [facility] for Home Health coverage PT. And was accepted They will make contact with the patient

## 2023-01-27 DIAGNOSIS — D510 Vitamin B12 deficiency anemia due to intrinsic factor deficiency: Secondary | ICD-10-CM | POA: Diagnosis not present

## 2023-01-27 DIAGNOSIS — I10 Essential (primary) hypertension: Secondary | ICD-10-CM | POA: Diagnosis not present

## 2023-01-27 DIAGNOSIS — J449 Chronic obstructive pulmonary disease, unspecified: Secondary | ICD-10-CM | POA: Diagnosis not present

## 2023-01-27 DIAGNOSIS — M25552 Pain in left hip: Secondary | ICD-10-CM | POA: Diagnosis not present

## 2023-01-27 DIAGNOSIS — G8929 Other chronic pain: Secondary | ICD-10-CM | POA: Diagnosis not present

## 2023-01-29 DIAGNOSIS — D510 Vitamin B12 deficiency anemia due to intrinsic factor deficiency: Secondary | ICD-10-CM | POA: Diagnosis not present

## 2023-01-29 DIAGNOSIS — G8929 Other chronic pain: Secondary | ICD-10-CM | POA: Diagnosis not present

## 2023-01-29 DIAGNOSIS — M25552 Pain in left hip: Secondary | ICD-10-CM | POA: Diagnosis not present

## 2023-01-29 DIAGNOSIS — I10 Essential (primary) hypertension: Secondary | ICD-10-CM | POA: Diagnosis not present

## 2023-01-29 DIAGNOSIS — J449 Chronic obstructive pulmonary disease, unspecified: Secondary | ICD-10-CM | POA: Diagnosis not present

## 2023-02-02 ENCOUNTER — Telehealth: Payer: Self-pay | Admitting: *Deleted

## 2023-02-02 DIAGNOSIS — I10 Essential (primary) hypertension: Secondary | ICD-10-CM | POA: Diagnosis not present

## 2023-02-02 DIAGNOSIS — M25552 Pain in left hip: Secondary | ICD-10-CM | POA: Diagnosis not present

## 2023-02-02 DIAGNOSIS — G8929 Other chronic pain: Secondary | ICD-10-CM | POA: Diagnosis not present

## 2023-02-02 DIAGNOSIS — J449 Chronic obstructive pulmonary disease, unspecified: Secondary | ICD-10-CM | POA: Diagnosis not present

## 2023-02-02 DIAGNOSIS — D510 Vitamin B12 deficiency anemia due to intrinsic factor deficiency: Secondary | ICD-10-CM | POA: Diagnosis not present

## 2023-02-02 NOTE — Telephone Encounter (Signed)
Transition Care Management Unsuccessful Follow-up Telephone Call  Date of discharge and from where:  [facility] 02/[facility]/20[facility]  Attempts:  2nd Attempt  Reason for unsuccessful TCM follow-up call:  Left voice message

## 2023-02-02 NOTE — Telephone Encounter (Signed)
Transition Care Management Unsuccessful Follow-up Telephone Call  Date of discharge and from where:  [facility] 01/24/20[facility]  Attempts:  1st attempt  Reason for unsuccessful TCM follow-up call:  No answer/busy

## 2023-02-04 DIAGNOSIS — I10 Essential (primary) hypertension: Secondary | ICD-10-CM | POA: Diagnosis not present

## 2023-02-04 DIAGNOSIS — D510 Vitamin B12 deficiency anemia due to intrinsic factor deficiency: Secondary | ICD-10-CM | POA: Diagnosis not present

## 2023-02-04 DIAGNOSIS — J449 Chronic obstructive pulmonary disease, unspecified: Secondary | ICD-10-CM | POA: Diagnosis not present

## 2023-02-04 DIAGNOSIS — G8929 Other chronic pain: Secondary | ICD-10-CM | POA: Diagnosis not present

## 2023-02-04 DIAGNOSIS — M25552 Pain in left hip: Secondary | ICD-10-CM | POA: Diagnosis not present

## 2023-02-05 DIAGNOSIS — M159 Polyosteoarthritis, unspecified: Secondary | ICD-10-CM | POA: Diagnosis not present

## 2023-02-05 DIAGNOSIS — E876 Hypokalemia: Secondary | ICD-10-CM | POA: Diagnosis not present

## 2023-02-05 DIAGNOSIS — F17200 Nicotine dependence, unspecified, uncomplicated: Secondary | ICD-10-CM | POA: Diagnosis not present

## 2023-02-06 DIAGNOSIS — J449 Chronic obstructive pulmonary disease, unspecified: Secondary | ICD-10-CM | POA: Diagnosis not present

## 2023-02-06 DIAGNOSIS — M25552 Pain in left hip: Secondary | ICD-10-CM | POA: Diagnosis not present

## 2023-02-06 DIAGNOSIS — G8929 Other chronic pain: Secondary | ICD-10-CM | POA: Diagnosis not present

## 2023-02-06 DIAGNOSIS — I10 Essential (primary) hypertension: Secondary | ICD-10-CM | POA: Diagnosis not present

## 2023-02-07 ENCOUNTER — Encounter (HOSPITAL_COMMUNITY): Payer: Self-pay

## 2023-02-07 ENCOUNTER — Emergency Department (HOSPITAL_COMMUNITY): Payer: Medicare Other

## 2023-02-07 ENCOUNTER — Inpatient Hospital Stay (HOSPITAL_COMMUNITY)
Admission: EM | Admit: 2023-02-07 | Discharge: 2023-02-13 | DRG: 065 | Disposition: A | Discharge status: Skilled Nursing Facility | Payer: Medicare Other | Attending: Family Medicine | Admitting: Family Medicine

## 2023-02-07 ENCOUNTER — Other Ambulatory Visit: Payer: Self-pay

## 2023-02-07 DIAGNOSIS — Z79899 Other long term (current) drug therapy: Secondary | ICD-10-CM | POA: Diagnosis not present

## 2023-02-07 DIAGNOSIS — M5136 Other intervertebral disc degeneration, lumbar region: Secondary | ICD-10-CM | POA: Diagnosis present

## 2023-02-07 DIAGNOSIS — R29818 Other symptoms and signs involving the nervous system: Secondary | ICD-10-CM | POA: Diagnosis not present

## 2023-02-07 DIAGNOSIS — K649 Unspecified hemorrhoids: Secondary | ICD-10-CM | POA: Diagnosis present

## 2023-02-07 DIAGNOSIS — R54 Age-related physical debility: Secondary | ICD-10-CM | POA: Diagnosis present

## 2023-02-07 DIAGNOSIS — R1312 Dysphagia, oropharyngeal phase: Secondary | ICD-10-CM | POA: Diagnosis not present

## 2023-02-07 DIAGNOSIS — Z8249 Family history of ischemic heart disease and other diseases of the circulatory system: Secondary | ICD-10-CM

## 2023-02-07 DIAGNOSIS — I499 Cardiac arrhythmia, unspecified: Secondary | ICD-10-CM | POA: Diagnosis not present

## 2023-02-07 DIAGNOSIS — R293 Abnormal posture: Secondary | ICD-10-CM | POA: Diagnosis not present

## 2023-02-07 DIAGNOSIS — Z7982 Long term (current) use of aspirin: Secondary | ICD-10-CM

## 2023-02-07 DIAGNOSIS — R262 Difficulty in walking, not elsewhere classified: Secondary | ICD-10-CM | POA: Diagnosis not present

## 2023-02-07 DIAGNOSIS — M50222 Other cervical disc displacement at C5-C6 level: Secondary | ICD-10-CM | POA: Diagnosis not present

## 2023-02-07 DIAGNOSIS — I639 Cerebral infarction, unspecified: Secondary | ICD-10-CM | POA: Diagnosis not present

## 2023-02-07 DIAGNOSIS — Z881 Allergy status to other antibiotic agents status: Secondary | ICD-10-CM

## 2023-02-07 DIAGNOSIS — E861 Hypovolemia: Secondary | ICD-10-CM | POA: Diagnosis not present

## 2023-02-07 DIAGNOSIS — D72829 Elevated white blood cell count, unspecified: Secondary | ICD-10-CM | POA: Diagnosis not present

## 2023-02-07 DIAGNOSIS — M436 Torticollis: Secondary | ICD-10-CM | POA: Diagnosis not present

## 2023-02-07 DIAGNOSIS — M6281 Muscle weakness (generalized): Secondary | ICD-10-CM | POA: Diagnosis not present

## 2023-02-07 DIAGNOSIS — Y92003 Bedroom of unspecified non-institutional (private) residence as the place of occurrence of the external cause: Secondary | ICD-10-CM | POA: Diagnosis not present

## 2023-02-07 DIAGNOSIS — H543 Unqualified visual loss, both eyes: Secondary | ICD-10-CM | POA: Diagnosis not present

## 2023-02-07 DIAGNOSIS — Z888 Allergy status to other drugs, medicaments and biological substances status: Secondary | ICD-10-CM

## 2023-02-07 DIAGNOSIS — G8194 Hemiplegia, unspecified affecting left nondominant side: Secondary | ICD-10-CM | POA: Diagnosis not present

## 2023-02-07 DIAGNOSIS — Z8719 Personal history of other diseases of the digestive system: Secondary | ICD-10-CM

## 2023-02-07 DIAGNOSIS — N2589 Other disorders resulting from impaired renal tubular function: Secondary | ICD-10-CM | POA: Diagnosis present

## 2023-02-07 DIAGNOSIS — E222 Syndrome of inappropriate secretion of antidiuretic hormone: Secondary | ICD-10-CM | POA: Diagnosis not present

## 2023-02-07 DIAGNOSIS — K5090 Crohn's disease, unspecified, without complications: Secondary | ICD-10-CM | POA: Diagnosis present

## 2023-02-07 DIAGNOSIS — I6521 Occlusion and stenosis of right carotid artery: Secondary | ICD-10-CM | POA: Diagnosis not present

## 2023-02-07 DIAGNOSIS — Z8673 Personal history of transient ischemic attack (TIA), and cerebral infarction without residual deficits: Secondary | ICD-10-CM | POA: Diagnosis not present

## 2023-02-07 DIAGNOSIS — R278 Other lack of coordination: Secondary | ICD-10-CM | POA: Diagnosis not present

## 2023-02-07 DIAGNOSIS — I69354 Hemiplegia and hemiparesis following cerebral infarction affecting left non-dominant side: Secondary | ICD-10-CM | POA: Diagnosis not present

## 2023-02-07 DIAGNOSIS — R339 Retention of urine, unspecified: Secondary | ICD-10-CM | POA: Diagnosis not present

## 2023-02-07 DIAGNOSIS — W06XXXA Fall from bed, initial encounter: Secondary | ICD-10-CM | POA: Diagnosis present

## 2023-02-07 DIAGNOSIS — R29810 Facial weakness: Secondary | ICD-10-CM | POA: Diagnosis present

## 2023-02-07 DIAGNOSIS — I6389 Other cerebral infarction: Secondary | ICD-10-CM | POA: Diagnosis not present

## 2023-02-07 DIAGNOSIS — E871 Hypo-osmolality and hyponatremia: Secondary | ICD-10-CM

## 2023-02-07 DIAGNOSIS — J449 Chronic obstructive pulmonary disease, unspecified: Secondary | ICD-10-CM | POA: Diagnosis not present

## 2023-02-07 DIAGNOSIS — G319 Degenerative disease of nervous system, unspecified: Secondary | ICD-10-CM | POA: Diagnosis not present

## 2023-02-07 DIAGNOSIS — I63231 Cerebral infarction due to unspecified occlusion or stenosis of right carotid arteries: Principal | ICD-10-CM | POA: Diagnosis present

## 2023-02-07 DIAGNOSIS — R29712 NIHSS score 12: Secondary | ICD-10-CM | POA: Diagnosis not present

## 2023-02-07 DIAGNOSIS — I10 Essential (primary) hypertension: Secondary | ICD-10-CM | POA: Diagnosis not present

## 2023-02-07 DIAGNOSIS — I63511 Cerebral infarction due to unspecified occlusion or stenosis of right middle cerebral artery: Secondary | ICD-10-CM | POA: Diagnosis not present

## 2023-02-07 DIAGNOSIS — E876 Hypokalemia: Secondary | ICD-10-CM | POA: Diagnosis not present

## 2023-02-07 DIAGNOSIS — I6782 Cerebral ischemia: Secondary | ICD-10-CM | POA: Diagnosis not present

## 2023-02-07 DIAGNOSIS — R531 Weakness: Secondary | ICD-10-CM | POA: Diagnosis not present

## 2023-02-07 DIAGNOSIS — M4802 Spinal stenosis, cervical region: Secondary | ICD-10-CM | POA: Diagnosis not present

## 2023-02-07 DIAGNOSIS — Z7401 Bed confinement status: Secondary | ICD-10-CM | POA: Diagnosis not present

## 2023-02-07 DIAGNOSIS — I6523 Occlusion and stenosis of bilateral carotid arteries: Secondary | ICD-10-CM | POA: Diagnosis not present

## 2023-02-07 DIAGNOSIS — Z823 Family history of stroke: Secondary | ICD-10-CM

## 2023-02-07 DIAGNOSIS — N179 Acute kidney failure, unspecified: Secondary | ICD-10-CM | POA: Diagnosis not present

## 2023-02-07 DIAGNOSIS — F17210 Nicotine dependence, cigarettes, uncomplicated: Secondary | ICD-10-CM | POA: Diagnosis not present

## 2023-02-07 DIAGNOSIS — Z741 Need for assistance with personal care: Secondary | ICD-10-CM | POA: Diagnosis not present

## 2023-02-07 DIAGNOSIS — E872 Acidosis, unspecified: Secondary | ICD-10-CM | POA: Diagnosis present

## 2023-02-07 DIAGNOSIS — Z0389 Encounter for observation for other suspected diseases and conditions ruled out: Secondary | ICD-10-CM | POA: Diagnosis not present

## 2023-02-07 DIAGNOSIS — N178 Other acute kidney failure: Secondary | ICD-10-CM | POA: Diagnosis not present

## 2023-02-07 DIAGNOSIS — Z9049 Acquired absence of other specified parts of digestive tract: Secondary | ICD-10-CM

## 2023-02-07 LAB — COMPREHENSIVE METABOLIC PANEL
ALT: 19 U/L (ref 0–44)
AST: 36 U/L (ref 15–41)
Albumin: 4.1 g/dL (ref 3.5–5.0)
Alkaline Phosphatase: 52 U/L (ref 38–126)
Anion gap: 13 (ref 5–15)
BUN: 37 mg/dL — ABNORMAL HIGH (ref 8–23)
CO2: 19 mmol/L — ABNORMAL LOW (ref 22–32)
Calcium: 9.1 mg/dL (ref 8.9–10.3)
Chloride: 83 mmol/L — ABNORMAL LOW (ref 98–111)
Creatinine, Ser: 1.39 mg/dL — ABNORMAL HIGH (ref 0.44–1.00)
GFR, Estimated: 39 mL/min — ABNORMAL LOW (ref >60)
Glucose, Bld: 115 mg/dL — ABNORMAL HIGH (ref 70–99)
Potassium: 4.7 mmol/L (ref 3.5–5.1)
Sodium: 115 mmol/L — CL (ref 135–145)
Total Bilirubin: 1.2 mg/dL (ref 0.3–1.2)
Total Protein: 7.1 g/dL (ref 6.5–8.1)

## 2023-02-07 LAB — DIFFERENTIAL
Abs Immature Granulocytes: 0.15 10*3/uL — ABNORMAL HIGH (ref 0.00–0.07)
Basophils Absolute: 0 10*3/uL (ref 0.0–0.1)
Basophils Relative: 0 %
Eosinophils Absolute: 0 10*3/uL (ref 0.0–0.5)
Eosinophils Relative: 0 %
Immature Granulocytes: 1 %
Lymphocytes Relative: 17 %
Lymphs Abs: 3.2 10*3/uL (ref 0.7–4.0)
Monocytes Absolute: 1.4 10*3/uL — ABNORMAL HIGH (ref 0.1–1.0)
Monocytes Relative: 7 %
Neutro Abs: 14.3 10*3/uL — ABNORMAL HIGH (ref 1.7–7.7)
Neutrophils Relative %: 75 %

## 2023-02-07 LAB — I-STAT CHEM 8, ED
BUN: 35 mg/dL — ABNORMAL HIGH (ref 8–23)
Calcium, Ion: 1.19 mmol/L (ref 1.15–1.40)
Chloride: 86 mmol/L — ABNORMAL LOW (ref 98–111)
Creatinine, Ser: 1.7 mg/dL — ABNORMAL HIGH (ref 0.44–1.00)
Glucose, Bld: 114 mg/dL — ABNORMAL HIGH (ref 70–99)
HCT: 45 % (ref 36.0–46.0)
Hemoglobin: 15.3 g/dL — ABNORMAL HIGH (ref 12.0–15.0)
Potassium: 4.9 mmol/L (ref 3.5–5.1)
Sodium: 116 mmol/L — CL (ref 135–145)
TCO2: 20 mmol/L — ABNORMAL LOW (ref 22–32)

## 2023-02-07 LAB — APTT: aPTT: 25 seconds (ref 24–36)

## 2023-02-07 LAB — CBC
HCT: 38.7 % (ref 36.0–46.0)
Hemoglobin: 13.7 g/dL (ref 12.0–15.0)
MCH: 33.7 pg (ref 26.0–34.0)
MCHC: 35.4 g/dL (ref 30.0–36.0)
MCV: 95.3 fL (ref 80.0–100.0)
Platelets: 363 10*3/uL (ref 150–400)
RBC: 4.06 MIL/uL (ref 3.87–5.11)
RDW: 11.5 % (ref 11.5–15.5)
WBC: 19.1 10*3/uL — ABNORMAL HIGH (ref 4.0–10.5)
nRBC: 0 % (ref 0.0–0.2)

## 2023-02-07 LAB — ETHANOL: Alcohol, Ethyl (B): <10 mg/dL (ref <10)

## 2023-02-07 LAB — PROTIME-INR
INR: 1 (ref 0.8–1.2)
Prothrombin Time: 13.2 seconds (ref 11.4–15.2)

## 2023-02-07 MED ORDER — SODIUM CHLORIDE 0.9 % IV SOLN: INTRAVENOUS | Status: DC

## 2023-02-07 NOTE — ED Provider Notes (Signed)
[place] EMERGENCY DEPARTMENT AT [facility] Provider Note   CSN: [id number] Arrival date & time: 02/07/23  2110     History  Chief Complaint  Patient presents with   [name] is a 77 y.o. female.   Fall  77 year old female, this patient has a medical history significant for heavy tobacco use, hypertension, she currently lives at home with her sister and her daughter.  The primary historians at this time are the same people that drove her to the hospital, it is the patient's nephew and his significant other.  They report that yesterday the patient fell out of her bed, she could not get up off of the ground so they called the paramedics who helped her to get back into her bed where she has been for the last 36 hours.  Because she has not been able to get out of bed they decided to bring her to the hospital to see why she was weak.  They have noticed that she had some subtle left-sided facial droop.  There has been no fevers no vomiting no headaches no chest pain no shortness of breath no coughing or other abnormal symptoms.     Home Medications Prior to Admission medications   Medication Sig Start Date End Date Taking? Authorizing Provider  acetaminophen (TYLENOL) 500 MG tablet Take 1 tablet (500 mg total) by mouth every 6 (six) hours as needed for mild pain. 04/12/15   Standley Brooking, MD  acyclovir (ZOVIRAX) 400 MG tablet Take 2 tablets (800 mg total) by mouth 5 (five) times daily. 05/07/21   Al Decant, PA-C  aspirin EC 81 MG tablet Take 1 tablet (81 mg total) by mouth daily. Resume in 1 week Patient taking differently: Take 81 mg by mouth daily. 02/25/15   Erick Blinks, MD  diphenhydrAMINE (SOMINEX) 25 MG tablet Take 25 mg by mouth at bedtime as needed for sleep.    [provider]  folic acid (FOLVITE) 1 MG tablet TAKE ONE TABLET BY MOUTH ONCE DAILY. 11/13/22   Carlan, Chelsea L, NP  HYDROcodone-acetaminophen (NORCO/VICODIN) 5-325 MG  tablet Take 1 tablet by mouth as needed for moderate pain. 05/07/21   Al Decant, PA-C  lisinopril (PRINIVIL,ZESTRIL) 10 MG tablet Take 10 mg by mouth daily. 02/19/16   [provider]  Omega-3 Fatty Acids (FISH OIL) 1000 MG CPDR Take by mouth daily at 6 (six) AM.    [provider]  ondansetron (ZOFRAN-ODT) 4 MG disintegrating tablet Take 4 mg by mouth every 6 (six) hours as needed. 12/22/19   [provider]  potassium chloride SA (KLOR-CON M) 20 MEQ tablet Take 1 tablet (20 mEq total) by mouth 2 (two) times daily. 01/15/23   Burgess Amor, PA-C  sulfaSALAzine (AZULFIDINE) 500 MG tablet TAKE (2) TABLETS BY MOUTH TWICE DAILY. 11/13/22   Raquel James, NP      Allergies    Flagyl [metronidazole hcl] and Other    Review of Systems   Review of Systems  All other systems reviewed and are negative.   Physical Exam Updated Vital Signs BP (!) 147/81   Pulse 94   Temp (!) 97.4 F (36.3 C) (Axillary)   Resp 18   Ht 1.651 m (5\' 5" )   Wt 53.1 kg   SpO2 91%   BMI 19.48 kg/m  Physical Exam Vitals and nursing note reviewed.  Constitutional:      General: She is not in acute distress.  Appearance: She is well-developed.  HENT:     Head: Normocephalic and atraumatic.     Mouth/Throat:     Pharynx: No oropharyngeal exudate.  Eyes:     General: No scleral icterus.       Right eye: No discharge.        Left eye: No discharge.     Conjunctiva/sclera: Conjunctivae normal.     Pupils: Pupils are equal, round, and reactive to light.  Neck:     Thyroid: No thyromegaly.     Vascular: No JVD.  Cardiovascular:     Rate and Rhythm: Regular rhythm. Tachycardia present.     Heart sounds: Normal heart sounds. No murmur heard.    No friction rub. No gallop.  Pulmonary:     Effort: Pulmonary effort is normal. No respiratory distress.     Breath sounds: Normal breath sounds. No wheezing or rales.  Abdominal:     General: Bowel sounds are normal. There is no  distension.     Palpations: Abdomen is soft. There is no mass.     Tenderness: There is no abdominal tenderness.  Musculoskeletal:        General: No tenderness. Normal range of motion.     Cervical back: Normal range of motion and neck supple.  Lymphadenopathy:     Cervical: No cervical adenopathy.  Skin:    General: Skin is warm and dry.     Findings: No erythema or rash.  Neurological:     Mental Status: She is alert.     Coordination: Coordination normal.     Comments: Left-sided facial droop, left-sided neglect, left arm and left leg hemiparesis, right side is normal, speech appears clear  Psychiatric:        Behavior: Behavior normal.     ED Results / Procedures / Treatments   Labs (all labs ordered are listed, but only abnormal results are displayed) Labs Reviewed  CBC - Abnormal; Notable for the following components:      Result Value   WBC 19.1 (*)    All other components within normal limits  DIFFERENTIAL - Abnormal; Notable for the following components:   Neutro Abs 14.3 (*)    Monocytes Absolute 1.4 (*)    Abs Immature Granulocytes 0.15 (*)    All other components within normal limits  COMPREHENSIVE METABOLIC PANEL - Abnormal; Notable for the following components:   Sodium 115 (*)    Chloride 83 (*)    CO2 19 (*)    Glucose, Bld 115 (*)    BUN 37 (*)    Creatinine, Ser 1.39 (*)    GFR, Estimated 39 (*)    All other components within normal limits  I-STAT CHEM 8, ED - Abnormal; Notable for the following components:   Sodium 116 (*)    Chloride 86 (*)    BUN 35 (*)    Creatinine, Ser 1.70 (*)    Glucose, Bld 114 (*)    TCO2 20 (*)    Hemoglobin 15.3 (*)    All other components within normal limits  ETHANOL  PROTIME-INR  APTT  RAPID URINE DRUG SCREEN, HOSP PERFORMED  URINALYSIS, ROUTINE W REFLEX MICROSCOPIC    EKG EKG Interpretation Date/Time:  Saturday February 07 2023 21:22:26 EDT Ventricular Rate:  103 PR Interval:  137 QRS Duration:  85 QT  Interval:  299 QTC Calculation: 392 R Axis:   67  Text Interpretation: Sinus tachycardia Borderline repolarization abnormality Confirmed by Eber Hong ([id number]) on 02/07/2023  9:28:47 PM  Radiology CT HEAD WO CONTRAST  Result Date: 02/07/2023 CLINICAL DATA:  Recent fall yesterday with increasing left-sided weakness, initial encounter EXAM: CT HEAD WITHOUT CONTRAST TECHNIQUE: Contiguous axial images were obtained from the base of the skull through the vertex without intravenous contrast. RADIATION DOSE REDUCTION: This exam was performed according to the departmental dose-optimization program which includes automated exposure control, adjustment of the mA and/or kV according to patient size and/or use of iterative reconstruction technique. COMPARISON:  01/24/2023 FINDINGS: Brain: No acute hemorrhage or space-occupying mass lesion is noted. Generalized atrophic changes are seen. There is a geographic area of decreased attenuation identified the right temporoparietal region posteriorly consistent within the evolving infarct likely of a subacute nature in the degree of decreased attenuation. Scattered chronic white matter ischemic changes are noted. 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 Vascular: No hyperdense vessel or unexpected calcification. Skull: Normal. Negative for fracture or focal lesion. Sinuses/Orbits: No acute finding. Other: None. IMPRESSION: Geographic area of decreased attenuation in the right posterior parietal lobe extending inferiorly into the posterior temporal lobe consistent with subacute infarct. No focal abnormality is seen. Generalized atrophic changes are noted stable from the prior study. Electronically Signed   By: Alcide Clever M.D.   On: 02/07/2023 22:35    Procedures .Critical Care  Performed by: Eber Hong, MD Authorized by: Eber Hong, MD   Critical care provider statement:    Critical care time (minutes):  45   Critical care time was exclusive of:   Separately billable procedures and treating other patients   Critical care was necessary to treat or prevent imminent or life-threatening deterioration of the following conditions:  CNS failure or compromise, endocrine crisis and dehydration   Critical care was time spent personally by me on the following activities:  Development of treatment plan with patient or surrogate, discussions with consultants, evaluation of patient's response to treatment, examination of patient, obtaining history from patient or surrogate, review of old charts, re-evaluation of patient's condition, pulse oximetry, ordering and review of radiographic studies, ordering and review of laboratory studies and ordering and performing treatments and interventions   I assumed direction of critical care for this patient from another provider in my specialty: no     Care discussed with: admitting provider   Comments:           Medications Ordered in ED Medications  0.9 %  sodium chloride infusion ( Intravenous New Bag/Given 02/07/23 2212)    ED Course/ Medical Decision Making/ A&P Clinical Course as of 02/07/23 2255  Sat Feb 07, 2023  2140 Stat chemistry reportedly read 116, this will be repeated on a formal metabolic panel  [BM]    Clinical Course User Index [BM] Eber Hong, MD                                 Medical Decision Making Amount and/or Complexity of Data Reviewed Labs: ordered. Radiology: ordered.  Risk Prescription drug management. Decision regarding hospitalization.   Unfortunately this patient presents with sequela of what appears to be a prior stroke.  She had symptoms at least from yesterday morning though I was not there, she has not been out of bed or has not been able to get up out of bed since that time.  Last seen normal would have been the night prior or approximately 48 hours ago.  At this time she will go for CT scan,  labs, she is not a candidate for intervention given the timeframe,  will rule out hemorrhage and admit to the hospital.  She does appear critically ill with an acute stroke   This patient presents to the ED for concern of weakness, fall, this involves an extensive number of treatment options, and is a complaint that carries with it a high risk of complications and morbidity.  The differential diagnosis includes acute ischemic or hemorrhagic stroke, electrolyte abnormalities, Todd's paralysis, anemic, GI bleed, tox   Co morbidities that complicate the patient evaluation  There are hyponatremia, patient with a history of hypertension on lisinopril, takes sulfasalazine for Crohn's disease   Additional history obtained:  Additional history obtained from medical record External records from outside source obtained and reviewed including gastroenterology notes   Lab Tests:  I Ordered, and personally interpreted labs.  The pertinent results include: So 116, creatinine 1.7, CBC with a 19,000 white blood count   Imaging Studies ordered:  I ordered imaging studies including CT scan of the brain shows ischemic stroke I independently visualized and interpreted imaging which showed ischemic stroke I agree with the radiologist interpretation   Cardiac Monitoring: / EKG:  The patient was maintained on a cardiac monitor.  I personally viewed and interpreted the cardiac monitored which showed an underlying rhythm of: Mild sinus tachycardia   Consultations Obtained:  I requested consultation with the critical care intensivist Dr. Tonia Brooms, and hospitalist Dr. Thomes Dinning,  and discussed lab and imaging findings as well as pertinent plan - they recommend: Admit to higher level of care   Problem List / ED Course / Critical interventions / Medication management  Saline started for hyponatremia, I suspect the patient is fluid down based on creatinine and the fact that she has not been out of bed in a couple of days I ordered medication including normal saline infusion  for hyponatremia and dehydration Reevaluation of the patient after these medicines showed that the patient stayed the same I have reviewed the patients home medicines and have made adjustments as needed   Social Determinants of Health:  Severely hyponatremic, elderly   Test / Admission - Considered:  Admit to higher level of care         Final Clinical Impression(s) / ED Diagnoses Final diagnoses:  Hyponatremia  Ischemic stroke (HCC)     Eber Hong, MD 02/07/23 2255

## 2023-02-07 NOTE — ED Triage Notes (Signed)
Pt to ED with family stating the pt had a fall yesterday but she has became increasingly weaker, unable to ambulate. Family reports left sided paralysis. EDP Miller at bedside on arrival to assess. Pt with left sided neglect, flacid, facial droop, hemiplegia. All symptoms began yesterday.

## 2023-02-07 NOTE — ED Notes (Signed)
Sodium level 115

## 2023-02-07 NOTE — H&P (Signed)
History and Physical    Patient: [name] IHK:[id number] DOB: 1946-03-24 DOA: 02/07/2023 DOS: the patient was seen and examined on 02/08/2023 PCP: Assunta Found, MD  Patient coming from: Home  Chief Complaint:  Chief Complaint  Patient presents with   Fall   HPI: [name] is a 77 y.o. female with medical history significant of hypertension, tobacco use who presents to the emergency Dept via EMS due to a fall at home.  Patient was unable to provide history, history was provided by daughter-in-law and EDP.  Per report, patient fell off of her bed yesterday and was unable to get up off the ground, EMS was called and patient was helped back to bed.  Shortly after this, patient was said to develop left sided weakness by daughter-in-law, but patient did not want to go to the ED.  Patient was unable to get out of bed since she fell off the bed (about 36 hours) and was also noted to have a subtle left-sided facial droop. There was no report of fever, headache, shortness of breath, coughing or abdominal pain.  ED Course:  In the emergency department, temperature was 97.26F, pulse 107), BP 99/96, respiratory rate 12/min, O2 sat 95%.  Workup in the ED showed normal CBC except for WBC of 19.1.  BMP showed sodium 115, potassium 4.5, chloride 83, bicarb 19, glucose 150, BUN/creatinine 37/1.39 (baseline creatinine at 1.0-1.1).  Alcohol level was less than 10.  CT head showed geographic. area of decreased attenuation in the right posterior parietal lobe extending inferiorly into the posterior temporal lobe consistent with subacute infarct. No focal abnormality is seen. IV hydration was started. PCCM was consulted and there was no need to admit pt to [facility], no need for hypertonic saline unless there is acute neurologic decompensation per EDP. Hospitalist was asked to admit patient  Review of Systems: Review of systems as noted in the HPI. All other systems reviewed and are negative.   Past Medical  History:  Diagnosis Date   Anemia, unspecified    Arthritis    Crohn's disease (HCC)    Degenerative disc disease, lumbar    Hypertension    Sciatica of right side    Stroke [facility])    Ventral hernia    Past Surgical History:  Procedure Laterality Date   CAROTID ENDARTERECTOMY     2004   CHOLECYSTECTOMY     2008   ESOPHAGOGASTRODUODENOSCOPY N/A 02/24/2015   Procedure: ESOPHAGOGASTRODUODENOSCOPY (EGD);  Surgeon: Corbin Ade, MD;  Location: [facility] ENDO SUITE;  Service: Endoscopy;  Laterality: N/A;   HEMICOLECTOMY     right   HIP ARTHROPLASTY Left 04/09/2015   Procedure: INSERT PARTIAL HIP REPLACEMENT LEFT;  Surgeon: Vickki Hearing, MD;  Location: [facility] ORS;  Service: Orthopedics;  Laterality: Left;   INCISIONAL HERNIA REPAIR N/A 05/20/2013   Procedure: Sherald Hess HERNIORRHAPHY WITH MESH;  Surgeon: Dalia Heading, MD;  Location: [facility] ORS;  Service: General;  Laterality: N/A;   INCISIONAL HERNIA REPAIR N/A 05/19/2014   Procedure: RECURRENT HERNIA REPAIR INCISIONAL;  Surgeon: Dalia Heading, MD;  Location: [facility] ORS;  Service: General;  Laterality: N/A;   INSERTION OF MESH N/A 05/20/2013   Procedure: INSERTION OF MESH;  Surgeon: Dalia Heading, MD;  Location: [facility] ORS;  Service: General;  Laterality: N/A;   INSERTION OF MESH N/A 05/19/2014   Procedure: INSERTION OF MESH;  Surgeon: Dalia Heading, MD;  Location: [facility] ORS;  Service: General;  Laterality: N/A;   TUBAL LIGATION  Social History:  reports that she has been smoking cigarettes. She has a 45 pack-year smoking history. She has never used smokeless tobacco. She reports that she does not drink alcohol and does not use drugs.   Allergies  Allergen Reactions   Flagyl [Metronidazole Hcl] Other (See Comments)    "makes me feel whoosy and weird"   Other     Unknown antibiotic and unknown pain medications     Family History  Problem Relation Age of Onset   GI problems Father    Stroke Brother    Heart attack Mother    Hypotension  Sister      Prior to Admission medications   Medication Sig Start Date End Date Taking? Authorizing Provider  acetaminophen (TYLENOL) 500 MG tablet Take 1 tablet (500 mg total) by mouth every 6 (six) hours as needed for mild pain. 04/12/15   Standley Brooking, MD  acyclovir (ZOVIRAX) 400 MG tablet Take 2 tablets (800 mg total) by mouth 5 (five) times daily. 05/07/21   Al Decant, PA-C  aspirin EC 81 MG tablet Take 1 tablet (81 mg total) by mouth daily. Resume in 1 week Patient taking differently: Take 81 mg by mouth daily. 02/25/15   Erick Blinks, MD  diphenhydrAMINE (SOMINEX) 25 MG tablet Take 25 mg by mouth at bedtime as needed for sleep.    [provider]  folic acid (FOLVITE) 1 MG tablet TAKE ONE TABLET BY MOUTH ONCE DAILY. 11/13/22   Carlan, Chelsea L, NP  HYDROcodone-acetaminophen (NORCO/VICODIN) 5-325 MG tablet Take 1 tablet by mouth as needed for moderate pain. 05/07/21   Al Decant, PA-C  lisinopril (PRINIVIL,ZESTRIL) 10 MG tablet Take 10 mg by mouth daily. 02/19/16   [provider]  Omega-3 Fatty Acids (FISH OIL) 1000 MG CPDR Take by mouth daily at 6 (six) AM.    [provider]  ondansetron (ZOFRAN-ODT) 4 MG disintegrating tablet Take 4 mg by mouth every 6 (six) hours as needed. 12/22/19   [provider]  potassium chloride SA (KLOR-CON M) 20 MEQ tablet Take 1 tablet (20 mEq total) by mouth 2 (two) times daily. 01/15/23   Burgess Amor, PA-C  sulfaSALAzine (AZULFIDINE) 500 MG tablet TAKE (2) TABLETS BY MOUTH TWICE DAILY. 11/13/22   Raquel James, NP    Physical Exam: BP 135/80   Pulse 92   Temp (!) 97.4 F (36.3 C) (Axillary)   Resp 15   Ht 5\' 5"  (1.651 m)   Wt 53.1 kg   SpO2 97%   BMI 19.48 kg/m   General: 77 y.o. year-old female well developed well nourished in no acute distress.  Alert and oriented x3. HEENT: NCAT, EOMI Neck: Supple, trachea medial Cardiovascular: Tachycardia. Regular rate and rhythm with no rubs  or gallops.  No thyromegaly or JVD noted.  No lower extremity edema. 2/4 pulses in all 4 extremities. Respiratory: Clear to auscultation with no wheezes or rales. Good inspiratory effort. Abdomen: Soft, nontender nondistended with normal bowel sounds x4 quadrants. Muskuloskeletal: No cyanosis, clubbing or edema noted bilaterally Neuro: CN II-XII intact, strength 4/5 in RUE and RLE, 3/5 in LUE, 2/5 in LLE.  Patient unable to lift left leg off the bed.  Sensation, reflexes intact Skin: No ulcerative lesions noted or rashes Psychiatry: Judgement and insight appear normal. Mood is appropriate for condition and setting          Labs on Admission:  Basic Metabolic Panel: Recent Labs  Lab 02/07/23 2124 02/07/23 2132  NA 115* 116*  K 4.7 4.9  CL 83* 86*  CO2 19*  --   GLUCOSE 115* 114*  BUN 37* 35*  CREATININE 1.39* 1.70*  CALCIUM 9.1  --    Liver Function Tests: Recent Labs  Lab 02/07/23 2124  AST 36  ALT 19  ALKPHOS 52  BILITOT 1.2  PROT 7.1  ALBUMIN 4.1   No results for input(s): "LIPASE", "AMYLASE" in the last 168 hours. No results for input(s): "AMMONIA" in the last 168 hours. CBC: Recent Labs  Lab 02/07/23 2124 02/07/23 2132  WBC 19.1*  --   NEUTROABS 14.3*  --   HGB 13.7 15.3*  HCT 38.7 45.0  MCV 95.3  --   PLT 363  --    Cardiac Enzymes: No results for input(s): "CKTOTAL", "CKMB", "CKMBINDEX", "TROPONINI" in the last 168 hours.  BNP (last 3 results) No results for input(s): "BNP" in the last 8760 hours.  ProBNP (last 3 results) No results for input(s): "PROBNP" in the last 8760 hours.  CBG: No results for input(s): "GLUCAP" in the last 168 hours.  Radiological Exams on Admission: CT HEAD WO CONTRAST  Result Date: 02/07/2023 CLINICAL DATA:  Recent fall yesterday with increasing left-sided weakness, initial encounter EXAM: CT HEAD WITHOUT CONTRAST TECHNIQUE: Contiguous axial images were obtained from the base of the skull through the vertex without  intravenous contrast. RADIATION DOSE REDUCTION: This exam was performed according to the departmental dose-optimization program which includes automated exposure control, adjustment of the mA and/or kV according to patient size and/or use of iterative reconstruction technique. COMPARISON:  01/24/2023 FINDINGS: Brain: No acute hemorrhage or space-occupying mass lesion is noted. Generalized atrophic changes are seen. There is a geographic area of decreased attenuation identified the right temporoparietal region posteriorly consistent within the evolving infarct likely of a subacute nature in the degree of decreased attenuation. Scattered chronic white matter ischemic changes are noted. 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 Vascular: No hyperdense vessel or unexpected calcification. Skull: Normal. Negative for fracture or focal lesion. Sinuses/Orbits: No acute finding. Other: None. IMPRESSION: Geographic area of decreased attenuation in the right posterior parietal lobe extending inferiorly into the posterior temporal lobe consistent with subacute infarct. No focal abnormality is seen. Generalized atrophic changes are noted stable from the prior study. Electronically Signed   By: Alcide Clever M.D.   On: 02/07/2023 22:35    EKG: I independently viewed the EKG done and my findings are as followed: Sinus tachycardia at a rate of 103 bpm  Assessment/Plan Present on Admission:  Acute ischemic stroke (HCC)  Benign essential HTN  Principal Problem:   Acute ischemic stroke [facility]) Active Problems:   Benign essential HTN   Hyponatremia   AKI (acute kidney injury) (HCC)   Leukocytosis  Ischemic stroke Patient will be admitted to telemetry unit  CT of head shows subacute Infarct Bilateral carotid ultrasound in the morning Echocardiogram in the morning MRI of brain without contrast in the morning Continue statin Consider Aspirin after MRI brain Continue fall precautions and neuro checks Lipid panel  and hemoglobin A1c will be checked Continue PT/SLP/OT eval and treat Bedside swallow eval by nursing prior to diet Teleneurology will be consulted and we shall await further recommendation  Hyponatremia Continue gentle hydration Continue to monitor sodium with serial BMPs Urine osmolality, serum osmolality and urine sodium will be checked  Acute kidney Injury BUN/creatinine 37/1.39 (baseline creatinine at 1.0-1.1). Continue gentle hydration Renally adjust medications, avoid nephrotoxic agents/dehydration/hypotension  Leukocytosis possibly reactive WBC  19.1, no suspicion for any acute infectious process at this time Continue to monitor WBC with morning labs  Essential hypertension  Antihypertensives PRN if Blood pressure is greater than 220/120 or there is a concern for End organ damage/contraindications for permissive HTN. If blood pressure is greater than 220/120 give labetalol PO or IV or Vasotec IV with a goal of 15% reduction in BP during the first 24 hours.   DVT prophylaxis: SCDs   Advance Care Planning: Full code  Consults: Teleneurology  Family Communication: Daughter in law at bedside (all questions answered to satisfaction)  Severity of Illness: The appropriate patient status for this patient is INPATIENT. Inpatient status is judged to be reasonable and necessary in order to provide the required intensity of service to ensure the patient's safety. The patient's presenting symptoms, physical exam findings, and initial radiographic and laboratory data in the context of their chronic comorbidities is felt to place them at high risk for further clinical deterioration. Furthermore, it is not anticipated that the patient will be medically stable for discharge from the hospital within 2 midnights of admission.   * I certify that at the point of admission it is my clinical judgment that the patient will require inpatient hospital care spanning beyond 2 midnights from the point of  admission due to high intensity of service, high risk for further deterioration and high frequency of surveillance required.*  Author: Frankey Shown, DO 02/08/2023 2:53 AM  For on call review [email or web address].

## 2023-02-07 NOTE — ED Notes (Signed)
Patient unable to provide urine specimen. incontinent pad was saturated. In and out cath performed no urine present. Bladder scanned

## 2023-02-08 ENCOUNTER — Other Ambulatory Visit (HOSPITAL_COMMUNITY): Payer: Self-pay | Admitting: *Deleted

## 2023-02-08 ENCOUNTER — Inpatient Hospital Stay (HOSPITAL_COMMUNITY): Payer: Medicare Other

## 2023-02-08 DIAGNOSIS — I6389 Other cerebral infarction: Secondary | ICD-10-CM | POA: Diagnosis not present

## 2023-02-08 DIAGNOSIS — I639 Cerebral infarction, unspecified: Secondary | ICD-10-CM

## 2023-02-08 DIAGNOSIS — D72829 Elevated white blood cell count, unspecified: Secondary | ICD-10-CM | POA: Insufficient documentation

## 2023-02-08 DIAGNOSIS — E871 Hypo-osmolality and hyponatremia: Principal | ICD-10-CM | POA: Insufficient documentation

## 2023-02-08 DIAGNOSIS — N179 Acute kidney failure, unspecified: Secondary | ICD-10-CM | POA: Insufficient documentation

## 2023-02-08 LAB — COMPREHENSIVE METABOLIC PANEL
ALT: 17 U/L (ref 0–44)
AST: 39 U/L (ref 15–41)
Albumin: 3.6 g/dL (ref 3.5–5.0)
Alkaline Phosphatase: 45 U/L (ref 38–126)
Anion gap: 12 (ref 5–15)
BUN: 36 mg/dL — ABNORMAL HIGH (ref 8–23)
CO2: 17 mmol/L — ABNORMAL LOW (ref 22–32)
Calcium: 8.4 mg/dL — ABNORMAL LOW (ref 8.9–10.3)
Chloride: 89 mmol/L — ABNORMAL LOW (ref 98–111)
Creatinine, Ser: 1.14 mg/dL — ABNORMAL HIGH (ref 0.44–1.00)
GFR, Estimated: 49 mL/min — ABNORMAL LOW (ref >60)
Glucose, Bld: 96 mg/dL (ref 70–99)
Potassium: 4.1 mmol/L (ref 3.5–5.1)
Sodium: 118 mmol/L — CL (ref 135–145)
Total Bilirubin: 1.2 mg/dL (ref 0.3–1.2)
Total Protein: 6.1 g/dL — ABNORMAL LOW (ref 6.5–8.1)

## 2023-02-08 LAB — HEMOGLOBIN A1C
Hgb A1c MFr Bld: 4.2 % — ABNORMAL LOW (ref 4.8–5.6)
Mean Plasma Glucose: 73.84 mg/dL

## 2023-02-08 LAB — ECHOCARDIOGRAM COMPLETE
Area-P 1/2: 1.27 cm2
Height: 65 in
S' Lateral: 2 cm
Weight: 1873.03 oz

## 2023-02-08 LAB — CBC
HCT: 34.1 % — ABNORMAL LOW (ref 36.0–46.0)
Hemoglobin: 11.9 g/dL — ABNORMAL LOW (ref 12.0–15.0)
MCH: 33.6 pg (ref 26.0–34.0)
MCHC: 34.9 g/dL (ref 30.0–36.0)
MCV: 96.3 fL (ref 80.0–100.0)
Platelets: 300 10*3/uL (ref 150–400)
RBC: 3.54 MIL/uL — ABNORMAL LOW (ref 3.87–5.11)
RDW: 11.5 % (ref 11.5–15.5)
WBC: 16.3 10*3/uL — ABNORMAL HIGH (ref 4.0–10.5)
nRBC: 0 % (ref 0.0–0.2)

## 2023-02-08 LAB — BASIC METABOLIC PANEL
Anion gap: 13 (ref 5–15)
BUN: 35 mg/dL — ABNORMAL HIGH (ref 8–23)
CO2: 18 mmol/L — ABNORMAL LOW (ref 22–32)
Calcium: 8.2 mg/dL — ABNORMAL LOW (ref 8.9–10.3)
Chloride: 89 mmol/L — ABNORMAL LOW (ref 98–111)
Creatinine, Ser: 1.19 mg/dL — ABNORMAL HIGH (ref 0.44–1.00)
GFR, Estimated: 47 mL/min — ABNORMAL LOW (ref >60)
Glucose, Bld: 97 mg/dL (ref 70–99)
Potassium: 4.4 mmol/L (ref 3.5–5.1)
Sodium: 120 mmol/L — ABNORMAL LOW (ref 135–145)

## 2023-02-08 LAB — LIPID PANEL
Cholesterol: 171 mg/dL (ref 0–200)
HDL: 51 mg/dL (ref >40)
LDL Cholesterol: 95 mg/dL (ref 0–99)
Total CHOL/HDL Ratio: 3.4 RATIO
Triglycerides: 124 mg/dL (ref <150)
VLDL: 25 mg/dL (ref 0–40)

## 2023-02-08 LAB — MRSA NEXT GEN BY PCR, NASAL: MRSA by PCR Next Gen: NOT DETECTED

## 2023-02-08 LAB — OSMOLALITY: Osmolality: 264 mOsm/kg — ABNORMAL LOW (ref 275–295)

## 2023-02-08 LAB — MAGNESIUM: Magnesium: 1.9 mg/dL (ref 1.7–2.4)

## 2023-02-08 LAB — PHOSPHORUS: Phosphorus: 3.4 mg/dL (ref 2.5–4.6)

## 2023-02-08 MED ORDER — ASPIRIN 81 MG PO TBEC
81.0000 mg | DELAYED_RELEASE_TABLET | Freq: Every day | ORAL | Status: DC
  Administered 2023-02-08 – 2023-02-13 (×6): 81 mg via ORAL
  Filled 2023-02-08 (×6): qty 1

## 2023-02-08 MED ORDER — ONDANSETRON HCL 4 MG PO TABS: 4.0000 mg | ORAL_TABLET | Freq: Four times a day (QID) | ORAL | Status: DC | PRN

## 2023-02-08 MED ORDER — ONDANSETRON HCL 4 MG/2ML IJ SOLN: 4.0000 mg | Freq: Four times a day (QID) | INTRAMUSCULAR | Status: DC | PRN

## 2023-02-08 MED ORDER — ACETAMINOPHEN 650 MG RE SUPP: 650.0000 mg | Freq: Four times a day (QID) | RECTAL | Status: DC | PRN

## 2023-02-08 MED ORDER — CHLORHEXIDINE GLUCONATE CLOTH 2 % EX PADS
6.0000 | MEDICATED_PAD | Freq: Every day | CUTANEOUS | Status: DC
  Administered 2023-02-08 – 2023-02-13 (×6): 6 via TOPICAL

## 2023-02-08 MED ORDER — PERFLUTREN LIPID MICROSPHERE
1.0000 mL | INTRAVENOUS | Status: DC | PRN
  Administered 2023-02-08: 3 mL via INTRAVENOUS

## 2023-02-08 MED ORDER — HYDROCODONE-ACETAMINOPHEN 5-325 MG PO TABS
1.0000 | ORAL_TABLET | Freq: Four times a day (QID) | ORAL | Status: DC | PRN
  Administered 2023-02-08 – 2023-02-13 (×6): 1 via ORAL
  Filled 2023-02-08 (×6): qty 1

## 2023-02-08 MED ORDER — ATORVASTATIN CALCIUM 40 MG PO TABS
40.0000 mg | ORAL_TABLET | Freq: Every day | ORAL | Status: DC
  Administered 2023-02-08 – 2023-02-13 (×6): 40 mg via ORAL
  Filled 2023-02-08 (×6): qty 1

## 2023-02-08 MED ORDER — NICOTINE 21 MG/24HR TD PT24
21.0000 mg | MEDICATED_PATCH | Freq: Every day | TRANSDERMAL | Status: DC
  Administered 2023-02-08 – 2023-02-13 (×6): 21 mg via TRANSDERMAL
  Filled 2023-02-08 (×6): qty 1

## 2023-02-08 MED ORDER — ENSURE ENLIVE PO LIQD
237.0000 mL | Freq: Two times a day (BID) | ORAL | Status: DC
  Administered 2023-02-09 – 2023-02-11 (×4): 237 mL via ORAL

## 2023-02-08 MED ORDER — CLOPIDOGREL BISULFATE 75 MG PO TABS
75.0000 mg | ORAL_TABLET | Freq: Every day | ORAL | Status: DC
  Administered 2023-02-08 – 2023-02-13 (×6): 75 mg via ORAL
  Filled 2023-02-08 (×6): qty 1

## 2023-02-08 MED ORDER — ACETAMINOPHEN 325 MG PO TABS
650.0000 mg | ORAL_TABLET | Freq: Four times a day (QID) | ORAL | Status: DC | PRN
  Administered 2023-02-08: 650 mg via ORAL
  Filled 2023-02-08: qty 2

## 2023-02-08 NOTE — Plan of Care (Signed)

## 2023-02-08 NOTE — Progress Notes (Signed)
*  PRELIMINARY RESULTS* Echocardiogram 2D Echocardiogram has been performed with Definity.  Stacey Drain 02/08/2023, 4:07 PM

## 2023-02-08 NOTE — Progress Notes (Signed)
PROGRESS NOTE  [name], is a 77 y.o. female, DOB - [birth year], ZDG:[id number]  Admit date - 02/07/2023   Admitting Physician Frankey Shown, DO  Outpatient Primary MD for the patient is Assunta Found, MD  LOS - 1  Chief Complaint  Patient presents with   Fall       Brief Narrative:  a 77 y.o. female with medical history significant of hypertension, tobacco use admitted on 02/07/2023 with acute strokes after developing left-sided weakness and fall on 02/06/2023    -Assessment and Plan: 1) acute strokes in the right MCA territory--- moderate-sized acute to subacute posterior right MCA stroke noted -Carotid Doppler with evidence of prior left carotid anatomy and extensive atheromatous plaque in the right ICA CTA neck recommended -Echo with EF of 70 to 75% with grade 1 diastolic dysfunction, no wall motion abnormalities -A1c is 4.2 -LDL 95, HDL 51, total cholesterol 951 triglycerides of 124 -Give aspirin, Plavix and atorvastatin -Official neuroconsult requested -PT and OT eval requested -Speech eval requested  2) tobacco abuse/COPD--patient smokes close to 2 pack of cigarettes per day -No acute flareup of COPD at this time -Nicotine patch as ordered- as needed bronchodilators ordered  3)Neck stiffness and reduced range of motion of neck patient instructed to her neck to the right which is new--- will get CT of the C-spine   Status is: Inpatient   Disposition: The patient is from: Home              Anticipated d/c is to: SNF              Anticipated d/c date is: 1 day              Patient currently is not medically stable to d/c. Barriers: Not Clinically Stable-   Code Status :  -  Code Status: Full Code   Family Communication:   Discussed with daughter [name] and patient's sister at bedside  DVT Prophylaxis  :   - SCDs   SCDs Start: 02/08/23 0241   Lab Results  Component Value Date   PLT 300 02/08/2023    Inpatient Medications  Scheduled Meds:  aspirin EC  81  mg Oral Q breakfast   atorvastatin  40 mg Oral Daily   Chlorhexidine Gluconate Cloth  6 each Topical Daily   clopidogrel  75 mg Oral Daily   [START ON 02/09/2023] feeding supplement  237 mL Oral BID BM   nicotine  21 mg Transdermal Daily   Continuous Infusions:  sodium chloride 100 mL/hr at 02/08/23 1825   PRN Meds:.acetaminophen **OR** acetaminophen, ondansetron **OR** ondansetron (ZOFRAN) IV   Anti-infectives (From admission, onward)    None         Subjective: [name] today has no fevers, no emesis,  No chest pain,   - Left-sided weakness persist -Neck discomfort and reduced range of motion of neck persist -Patient's daughter [name] and patient's sister at bedside, questions answered   Objective: Vitals:   02/08/23 1612 02/08/23 1700 02/08/23 1810 02/08/23 1900  BP: 119/63 (!) 98/54 138/80 135/81  Pulse: (!) 108 (!) 101 (!) 102 (!) 105  Resp: 18 17 18 18   Temp: 97.8 F (36.6 C)     TempSrc: Oral     SpO2: 98% 97% 98% 98%  Weight:      Height:        Intake/Output Summary (Last 24 hours) at 02/08/2023 1908 Last data filed at 02/08/2023 1545 Gross per 24 hour  Intake 1625.59 ml  Output 1 ml  Net 1624.59 ml   Filed Weights   02/07/23 2122 02/07/23 2250  Weight: 49 kg 53.1 kg    Physical Exam  Gen:- Awake Alert,  in no apparent distress  HEENT:- [place].AT, No sclera icterus Neck-limited range of motion of the neck patient prefers to look to the right, no step deformity or crepitus over the C-spine on palpation.  Lungs-  CTAB , fair symmetrical air movement CV- S1, S2 normal, regular  Abd-  +ve B.Sounds, Abd Soft, No tenderness,    Extremity/Skin:- No  edema, pedal pulses present  Psych-affect is appropriate, oriented x3 Neuro-left-sided hemiparesis,  no tremors  Data Reviewed: I have personally reviewed following labs and imaging studies  CBC: Recent Labs  Lab 02/07/23 2124 02/07/23 2132 02/08/23 0549  WBC 19.1*  --  16.3*  NEUTROABS 14.3*  --    --   HGB 13.7 15.3* 11.9*  HCT 38.7 45.0 34.1*  MCV 95.3  --  96.3  PLT 363  --  300   Basic Metabolic Panel: Recent Labs  Lab 02/07/23 2124 02/07/23 2132 02/08/23 0549 02/08/23 1507  NA 115* 116* 118* 120*  K 4.7 4.9 4.1 4.4  CL 83* 86* 89* 89*  CO2 19*  --  17* 18*  GLUCOSE 115* 114* 96 97  BUN 37* 35* 36* 35*  CREATININE 1.39* 1.70* 1.14* 1.19*  CALCIUM 9.1  --  8.4* 8.2*  MG  --   --  1.9  --   PHOS  --   --  3.4  --    GFR: Estimated Creatinine Clearance: 32.7 mL/min (A) (by C-G formula based on SCr of 1.19 mg/dL (H)). Liver Function Tests: Recent Labs  Lab 02/07/23 2124 02/08/23 0549  AST 36 39  ALT 19 17  ALKPHOS 52 45  BILITOT 1.2 1.2  PROT 7.1 6.1*  ALBUMIN 4.1 3.6    HbA1C: Recent Labs    02/08/23 0549  HGBA1C 4.2*   Recent Results (from the past 240 hour(s))  MRSA Next Gen by PCR, Nasal     Status: None   Collection Time: 02/08/23 12:47 AM   Specimen: Nasal Mucosa; Nasal Swab  Result Value Ref Range Status   MRSA by PCR Next Gen NOT DETECTED NOT DETECTED Final    Comment: (NOTE) The GeneXpert MRSA Assay (FDA approved for NASAL specimens only), is one component of a comprehensive MRSA colonization surveillance program. It is not intended to diagnose MRSA infection nor to guide or monitor treatment for MRSA infections. Test performance is not FDA approved in patients less than 57 years old. Performed at [facility], [address], [place], [place] [address]       Radiology Studies: MR BRAIN WO CONTRAST  Result Date: 02/08/2023 CLINICAL DATA:  Neuro deficit, acute, stroke suspected. EXAM: MRI HEAD WITHOUT CONTRAST TECHNIQUE: Multiplanar, multiecho pulse sequences of the brain and surrounding structures were obtained without intravenous contrast. COMPARISON:  Head CT 02/07/2023 FINDINGS: Brain: There is a moderate-sized acute to early subacute posterior right MCA infarct involving portions of the parietal lobe, lateral occipital lobe, and  posterior temporal and frontal lobes. A single focus of microhemorrhage is noted in the right parietal lobe and may be chronic. There is cytotoxic edema associated with the infarct with mild sulcal effacement but no midline shift or other significant mass effect. There are small chronic left parieto-occipital cortical infarcts. T2 hyperintensities in the cerebral white matter bilaterally are nonspecific but compatible with moderate chronic small vessel ischemic  disease. Chronic lacunar infarcts are noted in the deep cerebral white matter bilaterally. T2 hyperintensities throughout the basal ganglia bilaterally predominantly reflect dilated perivascular spaces, possibly with some interspersed chronic lacunar infarcts. No mass or extra-axial fluid collection is evident. There is moderate cerebral atrophy. Vascular: Major intracranial vascular flow voids are preserved. Skull and upper cervical spine: Unremarkable bone marrow signal. Sinuses/Orbits: Remarkable orbits. Clear paranasal sinuses. Trace bilateral mastoid effusions. Other: None. IMPRESSION: 1. Moderate-sized acute to early subacute posterior right MCA infarct. 2. Moderate chronic small vessel ischemic disease and cerebral atrophy. Electronically Signed   By: Sebastian Ache M.D.   On: 02/08/2023 18:09   ECHOCARDIOGRAM COMPLETE  Result Date: 02/08/2023    ECHOCARDIOGRAM REPORT   Patient Name:   [name] Date of Exam: 02/08/2023 Medical Rec #:  [id number]    Height:       65.0 in Accession #:    [id number]   Weight:       117.1 lb Date of Birth:  1946/01/08    BSA:          1.575 m Patient Age:    38 years     BP:           148/82 mmHg Patient Gender: F            HR:           98 bpm. [facility] Procedure: 2D Echo, Cardiac Doppler, Color Doppler and Intracardiac            Opacification Agent Indications:    Stroke l63.9  History:        Patient has no prior history of Echocardiogram examinations.                 Risk Factors:Hypertension  and Current Smoker.  Sonographer:    Celesta Gentile RCS Referring Phys: [id number] OLADAPO ADEFESO  Sonographer Comments: Technically difficult study due to poor echo windows. IMPRESSIONS  1. Left ventricular ejection fraction, by estimation, is 70 to 75%. The left ventricle has hyperdynamic function. The left ventricle has no regional wall motion abnormalities. There is mild asymmetric left ventricular hypertrophy of the basal-septal segment. Left ventricular diastolic parameters are consistent with Grade I diastolic dysfunction (impaired relaxation).  2. Right ventricular systolic function is normal. The right ventricular size is normal.  3. The mitral valve was not well visualized. No evidence of mitral valve regurgitation.  4. The aortic valve was not well visualized. There is mild calcification of the aortic valve. Aortic valve regurgitation is not visualized. Comparison(s): No prior Echocardiogram. FINDINGS  Left Ventricle: Left ventricular ejection fraction, by estimation, is 70 to 75%. The left ventricle has hyperdynamic function. The left ventricle has no regional wall motion abnormalities. Definity contrast agent was given IV to delineate the left ventricular endocardial borders. The left ventricular internal cavity size was normal in size. There is mild asymmetric left ventricular hypertrophy of the basal-septal segment. Left ventricular diastolic parameters are consistent with Grade I diastolic dysfunction (impaired relaxation). Right Ventricle: The right ventricular size is normal. No increase in right ventricular wall thickness. Right ventricular systolic function is normal. Left Atrium: Left atrial size was normal in size. Right Atrium: Right atrial size was normal in size. Pericardium: The pericardium was not well visualized. Mitral Valve: The mitral valve was not well visualized. No evidence of mitral valve regurgitation. Tricuspid Valve: The tricuspid valve is grossly normal. Tricuspid valve  regurgitation is not demonstrated. No evidence of tricuspid  stenosis. Aortic Valve: The aortic valve was not well visualized. There is mild calcification of the aortic valve. Aortic valve regurgitation is not visualized. Pulmonic Valve: The pulmonic valve was not well visualized. Pulmonic valve regurgitation is not visualized. No evidence of pulmonic stenosis. Aorta: The ascending aorta was not well visualized, the aortic arch was not well visualized and the aortic root was not well visualized. IAS/Shunts: The interatrial septum was not well visualized.  LEFT VENTRICLE PLAX 2D LVIDd:         2.60 cm   Diastology LVIDs:         2.00 cm   LV e' medial:    4.24 cm/s LV PW:         0.90 cm   LV E/e' medial:  11.8 LV IVS:        1.10 cm   LV e' lateral:   7.29 cm/s LVOT diam:     1.70 cm   LV E/e' lateral: 6.9 LV SV:         33 LV SV Index:   21 LVOT Area:     2.27 cm  RIGHT VENTRICLE RV S prime:     26.90 cm/s TAPSE (M-mode): 1.7 cm LEFT ATRIUM           Index        RIGHT ATRIUM          Index LA diam:      2.00 cm 1.27 cm/m   RA Area:     8.06 cm LA Vol (A2C): 17.4 ml 11.04 ml/m  RA Volume:   12.70 ml 8.06 ml/m LA Vol (A4C): 13.5 ml 8.57 ml/m  AORTIC VALVE LVOT Vmax:   95.40 cm/s LVOT Vmean:  59.800 cm/s LVOT VTI:    0.146 m  AORTA Ao Root diam: 2.60 cm MITRAL VALVE                TRICUSPID VALVE MV Area (PHT): 1.27 cm     TR Peak grad:   28.7 mmHg MV Decel Time: 599 msec     TR Vmax:        268.00 cm/s MV E velocity: 50.10 cm/s MV A velocity: 106.00 cm/s  SHUNTS MV E/A ratio:  0.47         Systemic VTI:  0.15 m                             Systemic Diam: 1.70 cm Riley Lam MD Electronically signed by Riley Lam MD Signature Date/Time: 02/08/2023/5:04:37 PM    Final    US Carotid Bilateral  Result Date: 02/08/2023 CLINICAL DATA:  Stroke Fall from bed 2 days ago Hypertension Tobacco user Prior left endarterectomy EXAM: BILATERAL CAROTID DUPLEX ULTRASOUND TECHNIQUE: Wallace Cullens scale imaging, color  Doppler and duplex ultrasound were performed of bilateral carotid and vertebral arteries in the neck. COMPARISON:  None available FINDINGS: Criteria: Quantification of carotid stenosis is based on velocity parameters that correlate the residual internal carotid diameter with NASCET-based stenosis levels, using the diameter of the distal internal carotid lumen as the denominator for stenosis measurement. The following velocity measurements were obtained: RIGHT ICA: 205/57 cm/sec CCA: 46/16 cm/sec SYSTOLIC ICA/CCA RATIO:  4.5 ECA: 150 cm/sec LEFT ICA: 481/138 cm/sec CCA: 56/6 cm/sec SYSTOLIC ICA/CCA RATIO:  8.6 ECA: 45 cm/sec RIGHT CAROTID ARTERY: Extensive calcified plaque of the right internal carotid artery origin. RIGHT VERTEBRAL ARTERY:  Antegrade flow. LEFT CAROTID ARTERY: Mild scattered  atheromatous plaque of the common carotid artery. Minimal mixed echogenicity plaque of the left carotid bulb. LEFT VERTEBRAL ARTERY:  Antegrade flow. IMPRESSION: 1. Extensive atheromatous plaque of the right ICA origin with velocity parameters consistent with 50-69% stenosis. 2. Post endarterectomy changes of the left internal carotid artery are seen. Minimal mixed echogenicity plaque noted in the left carotid bulb. Significantly elevated velocity in the proximal left internal carotid artery suggestive of greater than 70% stenosis is discordant with the grayscale appearance of this segment. The exact degree of stenosis is difficult to determine given the discordant findings. 3. Given the discordant findings of the left internal carotid artery and the extensive atheromatous plaque of the right internal carotid artery, further evaluation with CT angiography of the neck should be performed for more precise quantification of stenosis. Electronically Signed   By: Acquanetta Belling M.D.   On: 02/08/2023 16:09   CT HEAD WO CONTRAST  Result Date: 02/07/2023 CLINICAL DATA:  Recent fall yesterday with increasing left-sided weakness,  initial encounter EXAM: CT HEAD WITHOUT CONTRAST TECHNIQUE: Contiguous axial images were obtained from the base of the skull through the vertex without intravenous contrast. RADIATION DOSE REDUCTION: This exam was performed according to the departmental dose-optimization program which includes automated exposure control, adjustment of the mA and/or kV according to patient size and/or use of iterative reconstruction technique. COMPARISON:  01/24/2023 FINDINGS: Brain: No acute hemorrhage or space-occupying mass lesion is noted. Generalized atrophic changes are seen. There is a geographic area of decreased attenuation identified the right temporoparietal region posteriorly consistent within the evolving infarct likely of a subacute nature in the degree of decreased attenuation. Scattered chronic white matter ischemic changes are noted. 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 Vascular: No hyperdense vessel or unexpected calcification. Skull: Normal. Negative for fracture or focal lesion. Sinuses/Orbits: No acute finding. Other: None. IMPRESSION: Geographic area of decreased attenuation in the right posterior parietal lobe extending inferiorly into the posterior temporal lobe consistent with subacute infarct. No focal abnormality is seen. Generalized atrophic changes are noted stable from the prior study. Electronically Signed   By: Alcide Clever M.D.   On: 02/07/2023 22:35     Scheduled Meds:  aspirin EC  81 mg Oral Q breakfast   atorvastatin  40 mg Oral Daily   Chlorhexidine Gluconate Cloth  6 each Topical Daily   clopidogrel  75 mg Oral Daily   [START ON 02/09/2023] feeding supplement  237 mL Oral BID BM   nicotine  21 mg Transdermal Daily   Continuous Infusions:  sodium chloride 100 mL/hr at 02/08/23 1825     LOS: 1 day    Shon Hale M.D on 02/08/2023 at 7:08 PM  Go to [email or web address] - for contact info  [facility] - Office  [phone]  If 7PM-7AM, please contact  night-coverage [email or web address] 02/08/2023, 7:08 PM

## 2023-02-08 NOTE — Progress Notes (Signed)
Patient alert all shift, family at bedside most of the day, emotional support provided. Bedside yale swallow screen performed by writer this morning. Today, Patient was able to follow commands and prompts and tolerated drinking/swallowing water with straw with no signs or symptoms of difficulty swallowing. Patient was also able to tolerate taking PO pills without difficulty at this time, and No coughing or choking noted and voice remained clear. No changes in breathing or O2 sats.Dr Marisa Severin made aware. Suction set up at bedside. Will continue to monitor.

## 2023-02-09 ENCOUNTER — Inpatient Hospital Stay (HOSPITAL_COMMUNITY): Payer: Medicare Other

## 2023-02-09 DIAGNOSIS — I639 Cerebral infarction, unspecified: Secondary | ICD-10-CM | POA: Diagnosis not present

## 2023-02-09 LAB — URINALYSIS, ROUTINE W REFLEX MICROSCOPIC
Bilirubin Urine: NEGATIVE
Glucose, UA: 50 mg/dL — AB
Hgb urine dipstick: NEGATIVE
Ketones, ur: 20 mg/dL — AB
Nitrite: NEGATIVE
Protein, ur: 30 mg/dL — AB
Specific Gravity, Urine: 1.02 (ref 1.005–1.030)
pH: 5 (ref 5.0–8.0)

## 2023-02-09 LAB — RENAL FUNCTION PANEL
Albumin: 3.1 g/dL — ABNORMAL LOW (ref 3.5–5.0)
Anion gap: 8 (ref 5–15)
BUN: 33 mg/dL — ABNORMAL HIGH (ref 8–23)
CO2: 19 mmol/L — ABNORMAL LOW (ref 22–32)
Calcium: 8.2 mg/dL — ABNORMAL LOW (ref 8.9–10.3)
Chloride: 96 mmol/L — ABNORMAL LOW (ref 98–111)
Creatinine, Ser: 1.02 mg/dL — ABNORMAL HIGH (ref 0.44–1.00)
GFR, Estimated: 56 mL/min — ABNORMAL LOW (ref >60)
Glucose, Bld: 96 mg/dL (ref 70–99)
Phosphorus: 2.8 mg/dL (ref 2.5–4.6)
Potassium: 4.2 mmol/L (ref 3.5–5.1)
Sodium: 123 mmol/L — ABNORMAL LOW (ref 135–145)

## 2023-02-09 LAB — RAPID URINE DRUG SCREEN, HOSP PERFORMED
Amphetamines: NOT DETECTED
Barbiturates: NOT DETECTED
Benzodiazepines: NOT DETECTED
Cocaine: NOT DETECTED
Opiates: NOT DETECTED
Tetrahydrocannabinol: NOT DETECTED

## 2023-02-09 MED ORDER — BISACODYL 10 MG RE SUPP
10.0000 mg | Freq: Once | RECTAL | Status: AC
  Administered 2023-02-09: 10 mg via RECTAL
  Filled 2023-02-09: qty 1

## 2023-02-09 MED ORDER — STROKE: EARLY STAGES OF RECOVERY BOOK: Freq: Once | Status: AC

## 2023-02-09 MED ORDER — ADULT MULTIVITAMIN W/MINERALS CH
1.0000 | ORAL_TABLET | Freq: Every day | ORAL | Status: DC
  Administered 2023-02-09 – 2023-02-13 (×5): 1 via ORAL
  Filled 2023-02-09 (×5): qty 1

## 2023-02-09 MED ORDER — TAMSULOSIN HCL 0.4 MG PO CAPS
0.4000 mg | ORAL_CAPSULE | Freq: Every day | ORAL | Status: DC
  Administered 2023-02-09 – 2023-02-13 (×5): 0.4 mg via ORAL
  Filled 2023-02-09 (×5): qty 1

## 2023-02-09 MED ORDER — IOHEXOL 350 MG/ML SOLN
75.0000 mL | Freq: Once | INTRAVENOUS | Status: AC | PRN
  Administered 2023-02-09: 75 mL via INTRAVENOUS

## 2023-02-09 NOTE — Progress Notes (Signed)
   02/09/23 0150  Provider Notification  Provider Name/Title Dr. Thomes Dinning  Date Provider Notified 02/09/23  Time Provider Notified 0150  Method of Notification Page  Notification Reason Other (Comment) (Bladder scan is 1311)  Provider response See new orders  Date of Provider Response 02/09/23  Time of Provider Response [phone]

## 2023-02-09 NOTE — Plan of Care (Signed)
  Problem: Acute Rehab PT Goals(only PT should resolve) Goal: Pt Will Go Supine/Side To Sit Outcome: Progressing Flowsheets (Taken 02/09/2023 1456) Pt will go Supine/Side to Sit: with moderate assist Goal: Patient Will Transfer Sit To/From Stand Outcome: Progressing Flowsheets (Taken 02/09/2023 1456) Patient will transfer sit to/from stand: with moderate assist Goal: Pt Will Transfer Bed To Chair/Chair To Bed Outcome: Progressing Flowsheets (Taken 02/09/2023 1456) Pt will Transfer Bed to Chair/Chair to Bed: with mod assist Goal: Pt Will Ambulate Outcome: Progressing Flowsheets (Taken 02/09/2023 1456) Pt will Ambulate:  15 feet  with moderate assist  with rolling walker   2:56 PM, 02/09/23 Ocie Bob, MPT Physical Therapist with [facility] [phone] office [phone] mobile phone

## 2023-02-09 NOTE — Plan of Care (Signed)
  Problem: Acute Rehab OT Goals (only OT should resolve) Goal: Pt. Will Perform Eating Flowsheets (Taken 02/09/2023 1051) Pt Will Perform Eating:  sitting  with contact guard assist Goal: Pt. Will Perform Grooming Flowsheets (Taken 02/09/2023 1051) Pt Will Perform Grooming:  with contact guard assist  sitting Goal: Pt. Will Perform Upper Body Bathing Flowsheets (Taken 02/09/2023 1051) Pt Will Perform Upper Body Bathing:  with contact guard assist  sitting  with min assist Goal: Pt. Will Perform Lower Body Bathing Flowsheets (Taken 02/09/2023 1051) Pt Will Perform Lower Body Bathing:  with mod assist  sitting/lateral leans Goal: Pt. Will Perform Upper Body Dressing Flowsheets (Taken 02/09/2023 1051) Pt Will Perform Upper Body Dressing:  with contact guard assist  with min assist Goal: Pt. Will Perform Lower Body Dressing Flowsheets (Taken 02/09/2023 1051) Pt Will Perform Lower Body Dressing:  with mod assist  sitting/lateral leans Goal: Pt. Will Transfer To Toilet Flowsheets (Taken 02/09/2023 1051) Pt Will Transfer to Toilet:  with min assist  stand pivot transfer  with contact guard assist Goal: Pt. Will Perform Toileting-Clothing Manipulation Flowsheets (Taken 02/09/2023 1051) Pt Will Perform Toileting - Clothing Manipulation and hygiene:  with min assist  sitting/lateral leans Goal: Pt/Caregiver Will Perform Home Exercise Program Flowsheets (Taken 02/09/2023 1051) Pt/caregiver will Perform Home Exercise Program:  Increased ROM  Increased strength  Left upper extremity  With minimal assist  Harper Vandervoort OT, MOT

## 2023-02-09 NOTE — Consult Note (Signed)
I connected with  [name] on 02/09/23 by a video enabled telemedicine application and verified that I am speaking with the correct person using two identifiers.   I discussed the limitations of evaluation and management by telemedicine. The patient expressed understanding and agreed to proceed.  Location of patient: [facility] Location of physician: [facility]   Neurology Consultation Reason for Consult: stroke Referring Physician: Dr Shon Hale  CC: left sided weakness  History is obtained from:  patient,, chart review  HPI: [name] is a 77 y.o. female with past medical history of hypertension, left carotid endarterectomy, nicotine use disorder who presented on 02/07/2023 after a fall and left-sided weakness.  Per niece at bedside, after patient woke up in the morning her daughter was helping her use the bathroom and patient had a fall.  Daughter called EMS.  EMS came and helped patient get back into bed.  After EMS left, niece came home and noted that patient had left-sided weakness, was slurring her words and appeared to have facial droop.  Patient refused to come to the hospital until the next day when symptoms did not improve.  Reports taking aspirin 81 mg daily.  Last known normal: 02/06/2023 around 9 AM Event happened at home No tPA as outside window No thrombectomy as no large vessel occlusion mRS 4 (walks with a walker, needs help going up and down stairs, going to the bathroom)   ROS: All other systems reviewed and negative except as noted in the HPI.  Past Medical History:  Diagnosis Date   Anemia, unspecified    Arthritis    Crohn's disease (HCC)    Degenerative disc disease, lumbar    Hypertension    Sciatica of right side    Stroke (HCC)    Ventral hernia     Family History  Problem Relation Age of Onset   GI problems Father    Stroke Brother    Heart attack Mother    Hypotension Sister     Social History:  reports that she has been smoking  cigarettes. She has a 45 pack-year smoking history. She has never used smokeless tobacco. She reports that she does not drink alcohol and does not use drugs.   Medications Prior to Admission  Medication Sig Dispense Refill Last Dose   acetaminophen (TYLENOL) 500 MG tablet Take 1 tablet (500 mg total) by mouth every 6 (six) hours as needed for mild pain.   02/07/2023   aspirin EC 81 MG tablet Take 1 tablet (81 mg total) by mouth daily. Resume in 1 week (Patient taking differently: Take 81 mg by mouth daily.)   02/04/2023   diphenhydrAMINE (BENADRYL) 25 mg capsule Take 25 mg by mouth every 6 (six) hours as needed for itching or sleep.   unknown   diphenhydrAMINE (SOMINEX) 25 MG tablet Take 25 mg by mouth at bedtime as needed for sleep.   unknown   folic acid (FOLVITE) 1 MG tablet TAKE ONE TABLET BY MOUTH ONCE DAILY. 90 tablet 0 02/04/2023   lisinopril (PRINIVIL,ZESTRIL) 10 MG tablet Take 10 mg by mouth daily.   02/04/2023   ondansetron (ZOFRAN-ODT) 4 MG disintegrating tablet Take 4 mg by mouth every 6 (six) hours as needed.   02/05/2023   sulfaSALAzine (AZULFIDINE) 500 MG tablet TAKE (2) TABLETS BY MOUTH TWICE DAILY. 360 tablet 0 02/04/2023   traMADol-acetaminophen (ULTRACET) 37.5-325 MG tablet Take 1 tablet by mouth every 6 (six) hours as needed for moderate pain or severe  pain.   02/06/2023      Exam: Current vital signs: BP 124/67   Pulse 79   Temp (!) 96.7 F (35.9 C) (Axillary)   Resp 19   Ht 5\' 5"  (1.651 m)   Wt 52.6 kg   SpO2 96%   BMI 19.30 kg/m  Vital signs in last 24 hours: Temp:  [96.7 F (35.9 C)-98.2 F (36.8 C)] 96.7 F (35.9 C) (08/26 0748) Pulse Rate:  [79-108] 79 (08/26 0600) Resp:  [14-23] 19 (08/26 0600) BP: (98-167)/(54-96) 124/67 (08/26 0600) SpO2:  [91 %-99 %] 96 % (08/26 0600) Weight:  [52.6 kg] 52.6 kg (08/26 0441)   Physical Exam  Constitutional: Appears well-developed and well-nourished.  Psych: Affect appropriate to situation Neuro: AOx3, no aphasia, no  dysarthria, cranial nerves II 12 grossly intact except subtle nasolabial fold asymmetry on left as well as left hemianopia (does not have glasses so difficult to examine if it is hemianopia versus neglect on tele), antigravity strength in right upper and right lower extremity without drift, 2/5 in left upper and left lower extremity, FTN intact in right upper extremity, decree sensation to light touch in left upper and left lower extremity with left hemineglect  NIHSS 12 1A: Level of consciousness --> 0 = Alert; keenly responsive 1B: Ask month and age --> 0 = Both questions right 1C: 'Blink eyes' & 'squeeze hands' --> 0 = Performs both tasks 2: Horizontal extraocular movements --> 0 = Normal 3: Visual fields --> 2 = Complete hemianopia 4: Facial palsy --> 1 = Minor paralysis (flat nasolabial fold, smile asymmetry) 5A: Left arm motor drift --> 3 = No effort against gravity 5B: Right arm motor drift --> 0 = No drift for 10 seconds 6A: Left leg motor drift --> 3 = No effort against gravity 6B: Right leg motor drift --> 0 = No drift for 5 seconds 7: Limb Ataxia --> 0 = No ataxia 8: Sensation --> 1 = Mild-moderate loss: less sharp/more dull  9: Language/aphasia --> 0 = Normal; no aphasia 10: Dysarthria --> 0 = Normal 11: Extinction/inattention --> 2 = Profound hemi-inattention (ex: does not recognize own hand)   I have reviewed labs in epic and the results pertinent to this consultation are: CBC:  Recent Labs  Lab 02/07/23 2124 02/07/23 2132 02/08/23 0549  WBC 19.1*  --  16.3*  NEUTROABS 14.3*  --   --   HGB 13.7 15.3* 11.9*  HCT 38.7 45.0 34.1*  MCV 95.3  --  96.3  PLT 363  --  300    Basic Metabolic Panel:  Lab Results  Component Value Date   NA 123 (L) 02/09/2023   K 4.2 02/09/2023   CO2 19 (L) 02/09/2023   GLUCOSE 96 02/09/2023   BUN 33 (H) 02/09/2023   CREATININE 1.02 (H) 02/09/2023   CALCIUM 8.2 (L) 02/09/2023   GFRNONAA 56 (L) 02/09/2023   GFRAA >60 07/07/2015    Lipid Panel:  Lab Results  Component Value Date   LDLCALC 95 02/08/2023   HgbA1c:  Lab Results  Component Value Date   HGBA1C 4.2 (L) 02/08/2023   Urine Drug Screen:     Component Value Date/Time   LABOPIA NONE DETECTED 02/09/2023 0147   COCAINSCRNUR NONE DETECTED 02/09/2023 0147   LABBENZ NONE DETECTED 02/09/2023 0147   AMPHETMU NONE DETECTED 02/09/2023 0147   THCU NONE DETECTED 02/09/2023 0147   LABBARB NONE DETECTED 02/09/2023 0147    Alcohol Level     Component Value Date/Time  ETH <10 02/07/2023 2124     I have reviewed the images obtained:  CT Head without contrast 02/07/2023: Geographic area of decreased attenuation in the right posterior parietal lobe extending inferiorly into the posterior temporal lobe consistent with subacute infarct. No focal abnormality is seen. Generalized atrophic changes are noted stable from the prior study.   MRI Brain wo contrast 02/09/2023: Moderate-sized acute to early subacute posterior right MCA infarct. Moderate chronic small vessel ischemic disease and cerebral atrophy.  Carotid US 02/08/2023: Extensive atheromatous plaque of the right ICA origin with velocity parameters consistent with 50-69% stenosis. Post endarterectomy changes of the left internal carotid artery are seen. Minimal mixed echogenicity plaque noted in the left carotid bulb. Significantly elevated velocity in the proximal left  internal carotid artery suggestive of greater than 70% stenosis is discordant with the grayscale appearance of this segment. The exact degree of stenosis is difficult to determine given the discordant findings. Given the discordant findings of the left internal carotid artery and the extensive atheromatous plaque of the right internal carotid artery, further evaluation with CT angiography of the neck should be performed for more precise quantification of stenosis.  TTE 02/08/2023: LVEF 70-75% with no regional wall motion abnormalities.  Grade I  diastolic dysfunction (impaired relaxation). No thrombus. The interatrial septum was not well visualized.     ASSESSMENT/PLAN: 77yo F with sudden onset left sided weakness. MRI brain with R MCA infarct.   Acute ischemic stroke - etiology, likely atheroembolic  Recommendations: -CTA head and neck ordered and pending. -Please curbside vascular surgery to see if patient is a candidate for endarterectomy versus stenting. - In the meantime continue ASA 81mg  daily and plavix 75mg  daily for sec stroke prevention - Continue Atorva 40mg  daily - PT/OT - Goal BP: Normotension - f/u with neuro in 3 months, order placed - Discussed plan with Dr Mariea Clonts via secure chat  Thank you for allowing [place] to participate in the care of this patient. If you have any further questions, please contact  me or neurohospitalist.   Lindie Spruce [facility] neurohospitalist

## 2023-02-09 NOTE — Evaluation (Signed)
Physical Therapy Evaluation Patient Details Name: [name] MRN: [id number] DOB: 1946-03-17 Today's Date: 02/09/2023  History of Present Illness  [name] is a 77 y.o. female with medical history significant of hypertension, tobacco use who presents to the emergency Dept via EMS due to a fall at home.  Patient was unable to provide history, history was provided by daughter-in-law and EDP.  Per report, patient fell off of her bed yesterday and was unable to get up off the ground, EMS was called and patient was helped back to bed.  Shortly after this, patient was said to develop left sided weakness by daughter-in-law, but patient did not want to go to the ED.  Patient was unable to get out of bed since she fell off the bed (about 36 hours) and was also noted to have a subtle left-sided facial droop. There was no report of fever, headache, shortness of breath, coughing or abdominal pain.   Clinical Impression  Patient demonstrates slow labored movement for sitting up at bedside with little to no use of LUE, once seated had frequent leaning backwards requiring constant tactile assistance, able to stand on LLE, but poor carryover for gripping RW with left hand due to weakness.  Patient limited to a few side steps before having to sit due to loss of balance and fall risk.  Patient tolerated sitting up in chair with family member present after therapy.  Patient will benefit from continued skilled physical therapy in hospital and recommended venue below to increase strength, balance, endurance for safe ADLs and gait.           If plan is discharge home, recommend the following: A lot of help with bathing/dressing/bathroom;A lot of help with walking and/or transfers;Help with stairs or ramp for entrance;Assistance with cooking/housework   Can travel by private vehicle   No    Equipment Recommendations None recommended by PT  Recommendations for Other Services       Functional Status Assessment  Patient has had a recent decline in their functional status and demonstrates the ability to make significant improvements in function in a reasonable and predictable amount of time.     Precautions / Restrictions Precautions Precautions: Fall Restrictions Weight Bearing Restrictions: No      Mobility  Bed Mobility Overal bed mobility: Needs Assistance Bed Mobility: Supine to Sit     Supine to sit: Mod assist, Max assist     General bed mobility comments: limited use of LUE, poor carryover for attempting to prop up on elbows    Transfers Overall transfer level: Needs assistance Equipment used: Rolling walker (2 wheels) Transfers: Sit to/from Stand, Bed to chair/wheelchair/BSC Sit to Stand: Mod assist, Max assist   Step pivot transfers: Mod assist, Max assist       General transfer comment: unsteady labored movement with difficulty advancing LLE and holding onto RW with left hand    Ambulation/Gait Ambulation/Gait assistance: Max assist Gait Distance (Feet): 3 Feet Assistive device: Rolling walker (2 wheels) Gait Pattern/deviations: Decreased step length - left, Decreased stance time - right, Decreased stride length, Decreased dorsiflexion - left, Shuffle, Knees buckling Gait velocity: slow     General Gait Details: limited to a few few slow labored side steps with difficulty advance LLE and holding RW with left hand due to weakness, left sided neglect  Stairs            Wheelchair Mobility     Tilt Bed    Modified Rankin (Stroke  Patients Only)       Balance Overall balance assessment: Needs assistance Sitting-balance support: Feet supported, No upper extremity supported Sitting balance-Leahy Scale: Poor Sitting balance - Comments: seated at EOB   Standing balance support: Bilateral upper extremity supported, During functional activity, Reliant on assistive device for balance Standing balance-Leahy Scale: Poor Standing balance comment: using RW with  assist                             Pertinent Vitals/Pain Pain Assessment Pain Assessment: No/denies pain    Home Living Family/patient expects to be discharged to:: Private residence Living Arrangements: Other relatives Available Help at Discharge: Available 24 hours/day;Family Type of Home: House Home Access: Stairs to enter Entrance Stairs-Rails: Right Entrance Stairs-Number of Steps: 3   Home Layout: One level Home Equipment: Agricultural consultant (2 wheels);Shower seat - built in;Cane - single point;Wheelchair - manual      Prior Function Prior Level of Function : Needs assist         Mobility (physical): Transfers;Gait;Bed mobility;Stairs   Mobility Comments: household ambulator using RW, assisted for transfers ADLs Comments: Assist for bathing, lower body dressing, and IADL's.     Extremity/Trunk Assessment   Upper Extremity Assessment Upper Extremity Assessment: Defer to OT evaluation    Lower Extremity Assessment Lower Extremity Assessment: Generalized weakness;LLE deficits/detail LLE Deficits / Details: grossly -3/5 LLE Sensation: decreased light touch LLE Coordination: decreased fine motor;decreased gross motor    Cervical / Trunk Assessment Cervical / Trunk Assessment: Kyphotic  Communication   Communication Communication: No apparent difficulties  Cognition Arousal: Alert Behavior During Therapy: WFL for tasks assessed/performed, Agitated Overall Cognitive Status: Within Functional Limits for tasks assessed                                          General Comments      Exercises     Assessment/Plan    PT Assessment Patient needs continued PT services  PT Problem List Decreased strength;Decreased activity tolerance;Decreased balance;Decreased mobility;Decreased coordination;Impaired tone       PT Treatment Interventions DME instruction;Gait training;Stair training;Functional mobility training;Therapeutic  activities;Therapeutic exercise;Balance training;Manual techniques;Neuromuscular re-education    PT Goals (Current goals can be found in the Care Plan section)  Acute Rehab PT Goals Patient Stated Goal: return home after rehab PT Goal Formulation: With patient/family Time For Goal Achievement: 02/23/23 Potential to Achieve Goals: Good    Frequency Min 4X/week     Co-evaluation PT/OT/SLP Co-Evaluation/Treatment: Yes Reason for Co-Treatment: To address functional/ADL transfers PT goals addressed during session: Mobility/safety with mobility;Balance;Proper use of DME         AM-PAC PT "6 Clicks" Mobility  Outcome Measure Help needed turning from your back to your side while in a flat bed without using bedrails?: A Lot Help needed moving from lying on your back to sitting on the side of a flat bed without using bedrails?: A Lot Help needed moving to and from a bed to a chair (including a wheelchair)?: A Lot Help needed standing up from a chair using your arms (e.g., wheelchair or bedside chair)?: A Lot Help needed to walk in hospital room?: A Lot Help needed climbing 3-5 steps with a railing? : Total 6 Click Score: 11    End of Session   Activity Tolerance: Patient tolerated treatment well;Patient limited by fatigue Patient  left: in chair;with call bell/phone within reach;with family/visitor present Nurse Communication: Mobility status PT Visit Diagnosis: Unsteadiness on feet (R26.81);Other abnormalities of gait and mobility (R26.89);Muscle weakness (generalized) (M62.81)    Time: 1610-9604 PT Time Calculation (min) (ACUTE ONLY): 30 min   Charges:   PT Evaluation $PT Eval Moderate Complexity: 1 Mod PT Treatments $Therapeutic Activity: 23-37 mins PT General Charges $$ ACUTE PT VISIT: 1 Visit         2:55 PM, 02/09/23 Ocie Bob, MPT Physical Therapist with [facility] [phone] office [phone] mobile phone

## 2023-02-09 NOTE — Progress Notes (Signed)
   02/09/23 0240  Urine Characteristics  Urine Color Amber  Urine Appearance Clear  Intermittent/Straight Cath (mL) 1500 mL  Intermittent Catheter Size 16  Hygiene Peri care   Patient tolerated well.

## 2023-02-09 NOTE — Progress Notes (Signed)
Initial Nutrition Assessment  DOCUMENTATION CODES:   Not applicable  INTERVENTION:   -MVI with minerals daily -Continue Ensure Enlive po BID, each supplement provides 350 kcal and 20 grams of protein.  -Magic cup TID with meals, each supplement provides 290 kcal and 9 grams of protein   NUTRITION DIAGNOSIS:   Increased nutrient needs related to acute illness as evidenced by estimated needs.  GOAL:   Patient will meet greater than or equal to 90% of their needs  MONITOR:   PO intake, Supplement acceptance, Diet advancement  REASON FOR ASSESSMENT:   Malnutrition Screening Tool    ASSESSMENT:   Pt with medical history significant of hypertension, tobacco use who presents due to a fall at home.  Pt admitted with acute ischemic stroke.   8/25- passed yale swallow screen; advanced to dysphagia 2 diet  Reviewed I/O's: +788 ml x 24 hours and +1.6 L since admission  UOP: 1.6 L x 24 hours  Per H&P, CT revealed subacute infarct. Per RN, pt with no difficulty swallowing foods, liquids, or medications.   Pt currently on a dysphagia 2 diet; no meal completion data available to assess at this time.   Reviewed wt hx; wt has been stable over the past month, however, noted distant history of weight loss.   Medications reviewed and include 0.9% sodium chloride infusion @ 100 ml/hr.    Lab Results  Component Value Date   HGBA1C 4.2 (L) 02/08/2023   PTA DM medications are none.   Labs reviewed: Na: 123, CBGS: 98 (inpatient orders for glycemic control are none).    Diet Order:   Diet Order             DIET DYS 2 Room service appropriate? Yes; Fluid consistency: Thin  Diet effective now                   EDUCATION NEEDS:   No education needs have been identified at this time  Skin:  Skin Assessment: Reviewed RN Assessment  Last BM:  02/08/23 (type 7)  Height:   Ht Readings from Last 1 Encounters:  02/07/23 5\' 5"  (1.651 m)    Weight:   Wt Readings from Last  1 Encounters:  02/09/23 52.6 kg    Ideal Body Weight:  56.8 kg  BMI:  Body mass index is 19.3 kg/m.  Estimated Nutritional Needs:   Kcal:  1550-1750  Protein:  75-90 grams  Fluid:  > 1.5 L    Levada Schilling, RD, LDN, CDCES Registered Dietitian II Certified Diabetes Care and Education Specialist Please refer to [facility] for RD and/or RD on-call/weekend/after hours pager

## 2023-02-09 NOTE — Care Management Important Message (Signed)
Important Message  Patient Details  Name: [name] MRN: [id number] Date of Birth: 1946/04/23   Medicare Important Message Given:  N/A - LOS <3 / Initial given by admissions     Corey Harold 02/09/2023, 10:31 AM

## 2023-02-09 NOTE — Evaluation (Signed)
Clinical/Bedside Swallow Evaluation Patient Details  Name: [name] MRN: [id number] Date of Birth: February 23, 1946  Today's Date: 02/09/2023 Time: SLP Start Time (ACUTE ONLY): 1450 SLP Stop Time (ACUTE ONLY): 1515 SLP Time Calculation (min) (ACUTE ONLY): 25 min  Past Medical History:  Past Medical History:  Diagnosis Date   Anemia, unspecified    Arthritis    Crohn's disease (HCC)    Degenerative disc disease, lumbar    Hypertension    Sciatica of right side    Stroke [facility])    Ventral hernia    Past Surgical History:  Past Surgical History:  Procedure Laterality Date   CAROTID ENDARTERECTOMY     2004   CHOLECYSTECTOMY     2008   ESOPHAGOGASTRODUODENOSCOPY N/A 02/24/2015   Procedure: ESOPHAGOGASTRODUODENOSCOPY (EGD);  Surgeon: Corbin Ade, MD;  Location: [facility];  Service: Endoscopy;  Laterality: N/A;   HEMICOLECTOMY     right   HIP ARTHROPLASTY Left 04/09/2015   Procedure: INSERT PARTIAL HIP REPLACEMENT LEFT;  Surgeon: Vickki Hearing, MD;  Location: [facility] ORS;  Service: Orthopedics;  Laterality: Left;   INCISIONAL HERNIA REPAIR N/A 05/20/2013   Procedure: Sherald Hess HERNIORRHAPHY WITH MESH;  Surgeon: Dalia Heading, MD;  Location: [facility] ORS;  Service: General;  Laterality: N/A;   INCISIONAL HERNIA REPAIR N/A 05/19/2014   Procedure: RECURRENT HERNIA REPAIR INCISIONAL;  Surgeon: Dalia Heading, MD;  Location: [facility] ORS;  Service: General;  Laterality: N/A;   INSERTION OF MESH N/A 05/20/2013   Procedure: INSERTION OF MESH;  Surgeon: Dalia Heading, MD;  Location: [facility] ORS;  Service: General;  Laterality: N/A;   INSERTION OF MESH N/A 05/19/2014   Procedure: INSERTION OF MESH;  Surgeon: Dalia Heading, MD;  Location: [facility] ORS;  Service: General;  Laterality: N/A;   TUBAL LIGATION     HPI:  [name] is a 77 y.o. female with past medical history of hypertension, left carotid endarterectomy, nicotine use disorder who presented on 02/07/2023 after a fall and left-sided weakness.  Per  niece at bedside, after patient woke up in the morning her daughter was helping her use the bathroom and patient had a fall.  Daughter called EMS.  EMS came and helped patient get back into bed.  After EMS left, niece came home and noted that patient had left-sided weakness, was slurring her words and appeared to have facial droop.  Patient refused to come to the hospital until the next day when symptoms did not improve.  Reports taking aspirin 81 mg daily. MRI Brain wo contrast 02/09/2023: Moderate-sized acute to early subacute posterior right MCA infarct. Moderate chronic small vessel ischemic disease and cerebral  atrophy.    Assessment / Plan / Recommendation  Clinical Impression  Clincial swallow evaluation completed at bedside. Oral motor examination reveals mild left facial asymmetry, but speech is intelligibile. Pt is edentulous and reports that she typically eats soft foods and that the only meat she eats is ground sausage with gravy. Pt cued to bring her head forward and look down to her stomach, as she tipped head back. She consumed cup and straw sips thin water without incident, small amount of puree, and a few pieces of graham crackers dipped quickly in water per her request due to edentulous status. Pt does not exhibit signs or symptoms of aspiration and no significant oral residue with solids. Pt stated that she liked the textures brought to her at lunch (dysphagia 2) and wants to keep that. Recommend D2  and thin liquids with PO medications whole with water with feeder assist as needed due to left hemiparesis. Pt oriented to [facility] and [facility]. Pt appears to express herself well and understand 2+ step directions. No SLE ordered at this time. SLP will follow x1 for diet and SLE if warranted. Above to RN. SLP Visit Diagnosis: Dysphagia, unspecified (R13.10)    Aspiration Risk  No limitations    Diet Recommendation Dysphagia 2 (Fine chop);Thin liquid    Liquid Administration via:  Cup;Straw Medication Administration: Whole meds with liquid Compensations: Slow rate Postural Changes: Seated upright at 90 degrees;Remain upright for at least 30 minutes after po intake    Other  Recommendations Oral Care Recommendations: Oral care BID;Staff/trained caregiver to provide oral care    Recommendations for follow up therapy are one component of a multi-disciplinary discharge planning process, led by the attending physician.  Recommendations may be updated based on patient status, additional functional criteria and insurance authorization.  Follow up Recommendations Follow physician's recommendations for discharge plan and follow up therapies      Assistance Recommended at Discharge    Functional Status Assessment Patient has had a recent decline in their functional status and demonstrates the ability to make significant improvements in function in a reasonable and predictable amount of time.  Frequency and Duration min 2x/week  1 week       Prognosis Prognosis for improved oropharyngeal function: Good Barriers to Reach Goals: Motivation      Swallow Study   General Date of Onset: 02/07/23 HPI: [name] is a 77 y.o. female with past medical history of hypertension, left carotid endarterectomy, nicotine use disorder who presented on 02/07/2023 after a fall and left-sided weakness.  Per niece at bedside, after patient woke up in the morning her daughter was helping her use the bathroom and patient had a fall.  Daughter called EMS.  EMS came and helped patient get back into bed.  After EMS left, niece came home and noted that patient had left-sided weakness, was slurring her words and appeared to have facial droop.  Patient refused to come to the hospital until the next day when symptoms did not improve.  Reports taking aspirin 81 mg daily. MRI Brain wo contrast 02/09/2023: Moderate-sized acute to early subacute posterior right MCA infarct. Moderate chronic small vessel  ischemic disease and cerebral  atrophy. Type of Study: Bedside Swallow Evaluation Previous Swallow Assessment: N/A Diet Prior to this Study: Dysphagia 2 (finely chopped);Thin liquids (Level 0) Temperature Spikes Noted: No Respiratory Status: Room air History of Recent Intubation: No Behavior/Cognition: Alert;Cooperative;Pleasant mood Oral Cavity Assessment: Dry Oral Care Completed by SLP: Yes Oral Cavity - Dentition: Edentulous Vision: Functional for self-feeding Self-Feeding Abilities: Needs assist Patient Positioning: Upright in bed Baseline Vocal Quality: Normal Volitional Cough: Strong Volitional Swallow: Able to elicit    Oral/Motor/Sensory Function Overall Oral Motor/Sensory Function: Mild impairment Facial ROM: Reduced left;Suspected CN VII (facial) dysfunction Facial Symmetry: Abnormal symmetry left;Suspected CN VII (facial) dysfunction Facial Strength: Reduced left;Suspected CN VII (facial) dysfunction Facial Sensation: Within Functional Limits Lingual ROM: Within Functional Limits Lingual Symmetry: Within Functional Limits Lingual Strength: Within Functional Limits Lingual Sensation: Within Functional Limits Velum: Within Functional Limits Mandible: Within Functional Limits   Ice Chips Ice chips: Within functional limits Presentation: Spoon   Thin Liquid Thin Liquid: Within functional limits Presentation: Cup;Self Fed;Straw    Nectar Thick Nectar Thick Liquid: Not tested   Honey Thick Honey Thick Liquid: Not tested   Puree  Puree: Within functional limits Presentation: Spoon   Solid     Solid: Within functional limits Presentation: Spoon Other Comments: Pt agreable to small piece of graham cracker quickly dipped in water due to edentulous status     Thank you,  Havery Moros, CCC-SLP [phone]  Missy Baksh 02/09/2023,3:25 PM

## 2023-02-09 NOTE — Evaluation (Signed)
Occupational Therapy Evaluation Patient Details Name: [name] MRN: [id number] DOB: 09/17/1945 Today's Date: 02/09/2023   History of Present Illness [name] is a 77 y.o. female with medical history significant of hypertension, tobacco use who presents to the emergency Dept via EMS due to a fall at home.  Patient was unable to provide history, history was provided by daughter-in-law and EDP.  Per report, patient fell off of her bed yesterday and was unable to get up off the ground, EMS was called and patient was helped back to bed.  Shortly after this, patient was said to develop left sided weakness by daughter-in-law, but patient did not want to go to the ED.  Patient was unable to get out of bed since she fell off the bed (about 36 hours) and was also noted to have a subtle left-sided facial droop. There was no report of fever, headache, shortness of breath, coughing or abdominal pain.   Clinical Impression   Pt agreeable to OT and PT co-evaluation. Pt requires some assist for ADL's and mobility at baseline, but has seen a decrease in function with presentation of mostly flaccid L UE, but grip did improve with weight bearing using RW with assist. Mod to max A for bed mobility and transfer to chair. Pt not interested in going to [facility] for CIR. Family present wanted pt to have rehab close to home. Pt is in need of much help at this time. Pt also is presenting with significant L visual field neglect. Pt left in the chair with family present. Pt will benefit from continued OT in the hospital and recommended venue below to increase strength, balance, and endurance for safe ADL's.         If plan is discharge home, recommend the following: A lot of help with walking and/or transfers;A lot of help with bathing/dressing/bathroom;Assistance with cooking/housework;Assist for transportation;Help with stairs or ramp for entrance;Assistance with feeding    Functional Status Assessment  Patient  has had a recent decline in their functional status and demonstrates the ability to make significant improvements in function in a reasonable and predictable amount of time.  Equipment Recommendations  None recommended by OT           Precautions / Restrictions Precautions Precautions: Fall Restrictions Weight Bearing Restrictions: No      Mobility Bed Mobility Overal bed mobility: Needs Assistance Bed Mobility: Supine to Sit     Supine to sit: Mod assist, Max assist     General bed mobility comments: Much assist to move B LE and pull to sit.    Transfers Overall transfer level: Needs assistance Equipment used: Rolling walker (2 wheels) Transfers: Sit to/from Stand, Bed to chair/wheelchair/BSC Sit to Stand: Mod assist, Max assist     Step pivot transfers: Mod assist, Max assist     General transfer comment: Unsteady in standing. Assist to hold RW with L UE initially with improved grasp after weight bearing.      Balance Overall balance assessment: Needs assistance Sitting-balance support: Feet supported, Single extremity supported Sitting balance-Leahy Scale: Poor Sitting balance - Comments: seated at EOB Postural control: Left lateral lean, Posterior lean Standing balance support: Bilateral upper extremity supported, During functional activity, Reliant on assistive device for balance Standing balance-Leahy Scale: Poor Standing balance comment: using RW with assist                           ADL either performed or  assessed with clinical judgement   ADL Overall ADL's : Needs assistance/impaired Eating/Feeding: Set up;Minimal assistance;Bed level   Grooming: Moderate assistance;Sitting   Upper Body Bathing: Moderate assistance;Maximal assistance;Sitting   Lower Body Bathing: Maximal assistance;Total assistance;Bed level   Upper Body Dressing : Moderate assistance;Maximal assistance;Sitting   Lower Body Dressing: Maximal assistance;Total  assistance;Bed level   Toilet Transfer: Moderate assistance;Maximal assistance;Stand-pivot;Rolling walker (2 wheels) Toilet Transfer Details (indicate cue type and reason): Simulated via EOB to chair with RW Toileting- Clothing Manipulation and Hygiene: Total assistance;Maximal assistance;Bed level   Tub/ Shower Transfer: Maximal assistance;Total assistance;Stand-pivot   Functional mobility during ADLs: Maximal assistance;Rolling walker (2 wheels)       Vision Baseline Vision/History: 1 Wears glasses;3 Glaucoma Ability to See in Adequate Light: 1 Impaired Patient Visual Report: Blurring of vision (Pt reports this has resolved.) Vision Assessment?: Yes Tracking/Visual Pursuits: Other (comment) (Poor tracking to L of midline.) Visual Fields: Left visual field deficit (Head turned to R side. L visual field neglect.)     Perception Perception: Not tested       Praxis Praxis: Impaired   Praxis-Other Comments: Flaccid L UE other than grip.   Pertinent Vitals/Pain Pain Assessment Pain Assessment: No/denies pain     Extremity/Trunk Assessment Upper Extremity Assessment Upper Extremity Assessment: Right hand dominant;LUE deficits/detail LUE Deficits / Details: 3/5 composite flexion and extension of digits. Able to grip RW with assist at first. No noticeable active movement at elbow, wrist, or shoulder observed. WFL P/ROM. LUE Sensation: WNL LUE Coordination: decreased fine motor;decreased gross motor   Lower Extremity Assessment Lower Extremity Assessment: Defer to PT evaluation   Cervical / Trunk Assessment Cervical / Trunk Assessment: Kyphotic   Communication Communication Communication: No apparent difficulties   Cognition Arousal: Alert Behavior During Therapy: Agitated Overall Cognitive Status: Within Functional Limits for tasks assessed                                                        Home Living Family/patient expects to be discharged  to:: Private residence Living Arrangements: Other relatives Available Help at Discharge: Available 24 hours/day;Family Type of Home: House Home Access: Stairs to enter [employer] of Steps: 3 Entrance Stairs-Rails: Right (Fence on R side going up.) Home Layout: One level     Bathroom Shower/Tub: Producer, television/film/video: Handicapped height Bathroom Accessibility: Yes How Accessible: Accessible via walker Home Equipment: Agricultural consultant (2 wheels);Shower seat - built in;Cane - single point;Wheelchair - manual          Prior Functioning/Environment Prior Level of Function : Needs assist       Physical Assist : ADLs (physical);Mobility (physical) Mobility (physical): Transfers;Gait;Bed mobility ADLs (physical): Bathing;IADLs;Dressing Mobility Comments: PRN assist for mobility within the home using RW. ADLs Comments: Assist for bathing, lower body dressing, and IADL's.        OT Problem List: Decreased strength;Decreased range of motion;Decreased activity tolerance;Impaired balance (sitting and/or standing);Impaired vision/perception;Decreased coordination;Decreased safety awareness;Impaired tone;Impaired UE functional use      OT Treatment/Interventions: Self-care/ADL training;Therapeutic exercise;Therapeutic activities;Patient/family education;Balance training;DME and/or AE instruction;Visual/perceptual remediation/compensation;Neuromuscular education    OT Goals(Current goals can be found in the care plan section) Acute Rehab OT Goals Patient Stated Goal: Improve function at a rehab facility close to home. OT Goal Formulation: With patient/family Time For Goal Achievement: 02/23/23  Potential to Achieve Goals: Good  OT Frequency: Min 2X/week    Co-evaluation PT/OT/SLP Co-Evaluation/Treatment: Yes Reason for Co-Treatment: To address functional/ADL transfers   OT goals addressed during session: ADL's and self-care                       End  of Session Equipment Utilized During Treatment: Rolling walker (2 wheels)  Activity Tolerance: Patient tolerated treatment well Patient left: in chair;with call bell/phone within reach;with family/visitor present  OT Visit Diagnosis: Unsteadiness on feet (R26.81);Other abnormalities of gait and mobility (R26.89);Muscle weakness (generalized) (M62.81);History of falling (Z91.81);Other symptoms and signs involving the nervous system (R29.898);Hemiplegia and hemiparesis Hemiplegia - Right/Left: Left Hemiplegia - dominant/non-dominant: Non-Dominant Hemiplegia - caused by: Cerebral infarction                Time: 6440-3474 OT Time Calculation (min): 25 min Charges:  OT General Charges $OT Visit: 1 Visit OT Evaluation $OT Eval Low Complexity: 1 Low  Arhan Mcmanamon OT, MOT   Danie Chandler 02/09/2023, 10:47 AM

## 2023-02-09 NOTE — Plan of Care (Signed)
  Problem: Education: Goal: Knowledge of General Education information will improve Description: Including pain rating scale, medication(s)/side effects and non-pharmacologic comfort measures Outcome: Progressing   Problem: Health Behavior/Discharge Planning: Goal: Ability to manage health-related needs will improve Outcome: Progressing   Problem: Clinical Measurements: Goal: Ability to maintain clinical measurements within normal limits will improve Outcome: Progressing Goal: Diagnostic test results will improve Outcome: Progressing Goal: Respiratory complications will improve Outcome: Progressing Goal: Cardiovascular complication will be avoided Outcome: Progressing   Problem: Nutrition: Goal: Adequate nutrition will be maintained Outcome: Progressing   Problem: Coping: Goal: Level of anxiety will decrease Outcome: Progressing   Problem: Elimination: Goal: Will not experience complications related to bowel motility Outcome: Progressing   Problem: Pain Managment: Goal: General experience of comfort will improve Outcome: Progressing   Problem: Safety: Goal: Ability to remain free from injury will improve Outcome: Progressing   Problem: Skin Integrity: Goal: Risk for impaired skin integrity will decrease Outcome: Progressing   Problem: Education: Goal: Knowledge of disease or condition will improve Outcome: Progressing Goal: Knowledge of secondary prevention will improve (MUST DOCUMENT ALL) Outcome: Progressing Goal: Knowledge of patient specific risk factors will improve Loraine Leriche N/A or DELETE if not current risk factor) Outcome: Progressing   Problem: Ischemic Stroke/TIA Tissue Perfusion: Goal: Complications of ischemic stroke/TIA will be minimized Outcome: Progressing   Problem: Coping: Goal: Will verbalize positive feelings about self Outcome: Progressing Goal: Will identify appropriate support needs Outcome: Progressing   Problem: Health  Behavior/Discharge Planning: Goal: Ability to manage health-related needs will improve Outcome: Progressing Goal: Goals will be collaboratively established with patient/family Outcome: Progressing   Problem: Self-Care: Goal: Ability to participate in self-care as condition permits will improve Outcome: Progressing Goal: Verbalization of feelings and concerns over difficulty with self-care will improve Outcome: Progressing Goal: Ability to communicate needs accurately will improve Outcome: Progressing   Problem: Nutrition: Goal: Risk of aspiration will decrease Outcome: Progressing Goal: Dietary intake will improve Outcome: Progressing   Problem: Clinical Measurements: Goal: Will remain free from infection Outcome: Not Progressing   Problem: Activity: Goal: Risk for activity intolerance will decrease Outcome: Not Progressing   Problem: Elimination: Goal: Will not experience complications related to urinary retention Outcome: Not Progressing

## 2023-02-09 NOTE — Progress Notes (Addendum)
PROGRESS NOTE  [name], is a 77 y.o. female, DOB - 1946/01/24, WJX:[id number]  Admit date - 02/07/2023   Admitting Physician Frankey Shown, DO  Outpatient Primary MD for the patient is Assunta Found, MD  LOS - 2  Chief Complaint  Patient presents with   Fall       Brief Narrative:  a 77 y.o. female with medical history significant of hypertension, tobacco use admitted on 02/07/2023 with acute strokes after developing left-sided weakness and fall on 02/06/2023    -Assessment and Plan: 1) acute strokes in the right MCA territory--- moderate-sized acute to subacute posterior right MCA stroke noted -Carotid Doppler with evidence of prior left carotid anatomy and extensive atheromatous plaque in the right ICA CTA neck recommended -Echo with EF of 70 to 75% with grade 1 diastolic dysfunction, no wall motion abnormalities -A1c is 4.2 -LDL 95, HDL 51, total cholesterol 213 triglycerides of 124 -c/n  aspirin, Plavix and atorvastatin -Neurology consult appreciated -PT and OT eval appreciated, recommends SNF rehab -Speech eval appreciated, recommends Dysphagia 2 (Fine chop);Thin liquids -CTA head and neck pending---patient will most likely need outpatient follow-up with vascular surgeon -Outpatient neuro follow-up in 3 months  2) tobacco abuse/COPD--patient smokes close to 2 pack of cigarettes per day -No acute flareup of COPD at this time -Nicotine patch as ordered- as needed bronchodilators ordered  3)Neck stiffness and reduced range of motion of neck patient instructed to her neck to the right which is new---CT of the C-spine pending  4) hyponatremia--- improving with hydration  Status is: Inpatient   Disposition: The patient is from: Home              Anticipated d/c is to: SNF              Anticipated d/c date is: 1 day              Patient currently is medically stable to d/c. Barriers: Awaiting insurance authorization for transfer to SNF rehab  Code Status :  -  Code  Status: Full Code   Family Communication:   Discussed with daughter [name] and patient's sister at bedside  DVT Prophylaxis  :   - SCDs   SCDs Start: 02/08/23 0241   Lab Results  Component Value Date   PLT 300 02/08/2023   Inpatient Medications  Scheduled Meds:  aspirin EC  81 mg Oral Q breakfast   atorvastatin  40 mg Oral Daily   Chlorhexidine Gluconate Cloth  6 each Topical Daily   clopidogrel  75 mg Oral Daily   feeding supplement  237 mL Oral BID BM   multivitamin with minerals  1 tablet Oral Daily   nicotine  21 mg Transdermal Daily   tamsulosin  0.4 mg Oral QPC supper   Continuous Infusions:  sodium chloride 100 mL/hr at 02/09/23 0431   PRN Meds:.acetaminophen **OR** acetaminophen, HYDROcodone-acetaminophen, ondansetron **OR** ondansetron (ZOFRAN) IV   Anti-infectives (From admission, onward)    None      Subjective: [name] today has no fevers, no emesis,  No chest pain,   - -Patient sitting up in the chair, family member at bedside -Oral intake is fair   Objective: Vitals:   02/09/23 1500 02/09/23 1600 02/09/23 1700 02/09/23 1800  BP: 135/65 (!) 154/54 132/79 (!) 133/45  Pulse: 82 78 80 82  Resp: 18 17 14 17   Temp:      TempSrc:      SpO2: 96% 99% 98% 98%  Weight:  Height:        Intake/Output Summary (Last 24 hours) at 02/09/2023 1829 Last data filed at 02/09/2023 1200 Gross per 24 hour  Intake 2023.11 ml  Output 1600 ml  Net 423.11 ml   Filed Weights   02/07/23 2122 02/07/23 2250 02/09/23 0441  Weight: 49 kg 53.1 kg 52.6 kg    Physical Exam  Gen:- Awake Alert,  in no apparent distress  HEENT:- [place].AT, No sclera icterus Neck-limited range of motion of the neck patient prefers to look to the right, no step deformity or crepitus over the C-spine on palpation.  Lungs-  CTAB , fair symmetrical air movement CV- S1, S2 normal, regular  Abd-  +ve B.Sounds, Abd Soft, No tenderness,    Extremity/Skin:- No  edema, pedal pulses present   Psych-affect is appropriate, oriented x3 Neuro-left-sided hemiparesis,  no tremors  Data Reviewed: I have personally reviewed following labs and imaging studies  CBC: Recent Labs  Lab 02/07/23 2124 02/07/23 2132 02/08/23 0549  WBC 19.1*  --  16.3*  NEUTROABS 14.3*  --   --   HGB 13.7 15.3* 11.9*  HCT 38.7 45.0 34.1*  MCV 95.3  --  96.3  PLT 363  --  300   Basic Metabolic Panel: Recent Labs  Lab 02/07/23 2124 02/07/23 2132 02/08/23 0549 02/08/23 1507 02/09/23 0437  NA 115* 116* 118* 120* 123*  K 4.7 4.9 4.1 4.4 4.2  CL 83* 86* 89* 89* 96*  CO2 19*  --  17* 18* 19*  GLUCOSE 115* 114* 96 97 96  BUN 37* 35* 36* 35* 33*  CREATININE 1.39* 1.70* 1.14* 1.19* 1.02*  CALCIUM 9.1  --  8.4* 8.2* 8.2*  MG  --   --  1.9  --   --   PHOS  --   --  3.4  --  2.8   GFR: Estimated Creatinine Clearance: 37.7 mL/min (A) (by C-G formula based on SCr of 1.02 mg/dL (H)). Liver Function Tests: Recent Labs  Lab 02/07/23 2124 02/08/23 0549 02/09/23 0437  AST 36 39  --   ALT 19 17  --   ALKPHOS 52 45  --   BILITOT 1.2 1.2  --   PROT 7.1 6.1*  --   ALBUMIN 4.1 3.6 3.1*    HbA1C: Recent Labs    02/08/23 0549  HGBA1C 4.2*   Recent Results (from the past 240 hour(s))  MRSA Next Gen by PCR, Nasal     Status: None   Collection Time: 02/08/23 12:47 AM   Specimen: Nasal Mucosa; Nasal Swab  Result Value Ref Range Status   MRSA by PCR Next Gen NOT DETECTED NOT DETECTED Final    Comment: (NOTE) The GeneXpert MRSA Assay (FDA approved for NASAL specimens only), is one component of a comprehensive MRSA colonization surveillance program. It is not intended to diagnose MRSA infection nor to guide or monitor treatment for MRSA infections. Test performance is not FDA approved in patients less than 16 years old. Performed at [facility], [address], [place], [place] [address]       Radiology Studies: MR BRAIN WO CONTRAST  Result Date: 02/08/2023 CLINICAL DATA:  Neuro deficit,  acute, stroke suspected. EXAM: MRI HEAD WITHOUT CONTRAST TECHNIQUE: Multiplanar, multiecho pulse sequences of the brain and surrounding structures were obtained without intravenous contrast. COMPARISON:  Head CT 02/07/2023 FINDINGS: Brain: There is a moderate-sized acute to early subacute posterior right MCA infarct involving portions of the parietal lobe, lateral occipital lobe, and posterior  temporal and frontal lobes. A single focus of microhemorrhage is noted in the right parietal lobe and may be chronic. There is cytotoxic edema associated with the infarct with mild sulcal effacement but no midline shift or other significant mass effect. There are small chronic left parieto-occipital cortical infarcts. T2 hyperintensities in the cerebral white matter bilaterally are nonspecific but compatible with moderate chronic small vessel ischemic disease. Chronic lacunar infarcts are noted in the deep cerebral white matter bilaterally. T2 hyperintensities throughout the basal ganglia bilaterally predominantly reflect dilated perivascular spaces, possibly with some interspersed chronic lacunar infarcts. No mass or extra-axial fluid collection is evident. There is moderate cerebral atrophy. Vascular: Major intracranial vascular flow voids are preserved. Skull and upper cervical spine: Unremarkable bone marrow signal. Sinuses/Orbits: Remarkable orbits. Clear paranasal sinuses. Trace bilateral mastoid effusions. Other: None. IMPRESSION: 1. Moderate-sized acute to early subacute posterior right MCA infarct. 2. Moderate chronic small vessel ischemic disease and cerebral atrophy. Electronically Signed   By: Sebastian Ache M.D.   On: 02/08/2023 18:09   ECHOCARDIOGRAM COMPLETE  Result Date: 02/08/2023    ECHOCARDIOGRAM REPORT   Patient Name:   RASHIA SHEFF Date of Exam: 02/08/2023 Medical Rec #:  [id number]    Height:       65.0 in Accession #:    [id number]   Weight:       117.1 lb Date of Birth:  1945/08/17    BSA:          1.575  m Patient Age:    46 years     BP:           148/82 mmHg Patient Gender: F            HR:           98 bpm. [facility] Procedure: 2D Echo, Cardiac Doppler, Color Doppler and Intracardiac            Opacification Agent Indications:    Stroke l63.9  History:        Patient has no prior history of Echocardiogram examinations.                 Risk Factors:Hypertension and Current Smoker.  Sonographer:    Celesta Gentile RCS Referring Phys: [id number] OLADAPO ADEFESO  Sonographer Comments: Technically difficult study due to poor echo windows. IMPRESSIONS  1. Left ventricular ejection fraction, by estimation, is 70 to 75%. The left ventricle has hyperdynamic function. The left ventricle has no regional wall motion abnormalities. There is mild asymmetric left ventricular hypertrophy of the basal-septal segment. Left ventricular diastolic parameters are consistent with Grade I diastolic dysfunction (impaired relaxation).  2. Right ventricular systolic function is normal. The right ventricular size is normal.  3. The mitral valve was not well visualized. No evidence of mitral valve regurgitation.  4. The aortic valve was not well visualized. There is mild calcification of the aortic valve. Aortic valve regurgitation is not visualized. Comparison(s): No prior Echocardiogram. FINDINGS  Left Ventricle: Left ventricular ejection fraction, by estimation, is 70 to 75%. The left ventricle has hyperdynamic function. The left ventricle has no regional wall motion abnormalities. Definity contrast agent was given IV to delineate the left ventricular endocardial borders. The left ventricular internal cavity size was normal in size. There is mild asymmetric left ventricular hypertrophy of the basal-septal segment. Left ventricular diastolic parameters are consistent with Grade I diastolic dysfunction (impaired relaxation). Right Ventricle: The right ventricular size is normal. No increase in right ventricular  wall thickness.  Right ventricular systolic function is normal. Left Atrium: Left atrial size was normal in size. Right Atrium: Right atrial size was normal in size. Pericardium: The pericardium was not well visualized. Mitral Valve: The mitral valve was not well visualized. No evidence of mitral valve regurgitation. Tricuspid Valve: The tricuspid valve is grossly normal. Tricuspid valve regurgitation is not demonstrated. No evidence of tricuspid stenosis. Aortic Valve: The aortic valve was not well visualized. There is mild calcification of the aortic valve. Aortic valve regurgitation is not visualized. Pulmonic Valve: The pulmonic valve was not well visualized. Pulmonic valve regurgitation is not visualized. No evidence of pulmonic stenosis. Aorta: The ascending aorta was not well visualized, the aortic arch was not well visualized and the aortic root was not well visualized. IAS/Shunts: The interatrial septum was not well visualized.  LEFT VENTRICLE PLAX 2D LVIDd:         2.60 cm   Diastology LVIDs:         2.00 cm   LV e' medial:    4.24 cm/s LV PW:         0.90 cm   LV E/e' medial:  11.8 LV IVS:        1.10 cm   LV e' lateral:   7.29 cm/s LVOT diam:     1.70 cm   LV E/e' lateral: 6.9 LV SV:         33 LV SV Index:   21 LVOT Area:     2.27 cm  RIGHT VENTRICLE RV S prime:     26.90 cm/s TAPSE (M-mode): 1.7 cm LEFT ATRIUM           Index        RIGHT ATRIUM          Index LA diam:      2.00 cm 1.27 cm/m   RA Area:     8.06 cm LA Vol (A2C): 17.4 ml 11.04 ml/m  RA Volume:   12.70 ml 8.06 ml/m LA Vol (A4C): 13.5 ml 8.57 ml/m  AORTIC VALVE LVOT Vmax:   95.40 cm/s LVOT Vmean:  59.800 cm/s LVOT VTI:    0.146 m  AORTA Ao Root diam: 2.60 cm MITRAL VALVE                TRICUSPID VALVE MV Area (PHT): 1.27 cm     TR Peak grad:   28.7 mmHg MV Decel Time: 599 msec     TR Vmax:        268.00 cm/s MV E velocity: 50.10 cm/s MV A velocity: 106.00 cm/s  SHUNTS MV E/A ratio:  0.47         Systemic VTI:  0.15 m                              Systemic Diam: 1.70 cm Riley Lam MD Electronically signed by Riley Lam MD Signature Date/Time: 02/08/2023/5:04:37 PM    Final    US Carotid Bilateral  Result Date: 02/08/2023 CLINICAL DATA:  Stroke Fall from bed 2 days ago Hypertension Tobacco user Prior left endarterectomy EXAM: BILATERAL CAROTID DUPLEX ULTRASOUND TECHNIQUE: Wallace Cullens scale imaging, color Doppler and duplex ultrasound were performed of bilateral carotid and vertebral arteries in the neck. COMPARISON:  None available FINDINGS: Criteria: Quantification of carotid stenosis is based on velocity parameters that correlate the residual internal carotid diameter with NASCET-based stenosis levels, using the diameter of the distal internal carotid  lumen as the denominator for stenosis measurement. The following velocity measurements were obtained: RIGHT ICA: 205/57 cm/sec CCA: 46/16 cm/sec SYSTOLIC ICA/CCA RATIO:  4.5 ECA: 150 cm/sec LEFT ICA: 481/138 cm/sec CCA: 56/6 cm/sec SYSTOLIC ICA/CCA RATIO:  8.6 ECA: 45 cm/sec RIGHT CAROTID ARTERY: Extensive calcified plaque of the right internal carotid artery origin. RIGHT VERTEBRAL ARTERY:  Antegrade flow. LEFT CAROTID ARTERY: Mild scattered atheromatous plaque of the common carotid artery. Minimal mixed echogenicity plaque of the left carotid bulb. LEFT VERTEBRAL ARTERY:  Antegrade flow. IMPRESSION: 1. Extensive atheromatous plaque of the right ICA origin with velocity parameters consistent with 50-69% stenosis. 2. Post endarterectomy changes of the left internal carotid artery are seen. Minimal mixed echogenicity plaque noted in the left carotid bulb. Significantly elevated velocity in the proximal left internal carotid artery suggestive of greater than 70% stenosis is discordant with the grayscale appearance of this segment. The exact degree of stenosis is difficult to determine given the discordant findings. 3. Given the discordant findings of the left internal carotid artery and the  extensive atheromatous plaque of the right internal carotid artery, further evaluation with CT angiography of the neck should be performed for more precise quantification of stenosis. Electronically Signed   By: Acquanetta Belling M.D.   On: 02/08/2023 16:09   CT HEAD WO CONTRAST  Result Date: 02/07/2023 CLINICAL DATA:  Recent fall yesterday with increasing left-sided weakness, initial encounter EXAM: CT HEAD WITHOUT CONTRAST TECHNIQUE: Contiguous axial images were obtained from the base of the skull through the vertex without intravenous contrast. RADIATION DOSE REDUCTION: This exam was performed according to the departmental dose-optimization program which includes automated exposure control, adjustment of the mA and/or kV according to patient size and/or use of iterative reconstruction technique. COMPARISON:  01/24/2023 FINDINGS: Brain: No acute hemorrhage or space-occupying mass lesion is noted. Generalized atrophic changes are seen. There is a geographic area of decreased attenuation identified the right temporoparietal region posteriorly consistent within the evolving infarct likely of a subacute nature in the degree of decreased attenuation. Scattered chronic white matter ischemic changes are noted. 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 Vascular: No hyperdense vessel or unexpected calcification. Skull: Normal. Negative for fracture or focal lesion. Sinuses/Orbits: No acute finding. Other: None. IMPRESSION: Geographic area of decreased attenuation in the right posterior parietal lobe extending inferiorly into the posterior temporal lobe consistent with subacute infarct. No focal abnormality is seen. Generalized atrophic changes are noted stable from the prior study. Electronically Signed   By: Alcide Clever M.D.   On: 02/07/2023 22:35     Scheduled Meds:  aspirin EC  81 mg Oral Q breakfast   atorvastatin  40 mg Oral Daily   Chlorhexidine Gluconate Cloth  6 each Topical Daily   clopidogrel  75 mg  Oral Daily   feeding supplement  237 mL Oral BID BM   multivitamin with minerals  1 tablet Oral Daily   nicotine  21 mg Transdermal Daily   tamsulosin  0.4 mg Oral QPC supper   Continuous Infusions:  sodium chloride 100 mL/hr at 02/09/23 0431     LOS: 2 days    Shon Hale M.D on 02/09/2023 at 6:29 PM  Go to [email or web address] - for contact info  [facility] - Office  [phone]  If 7PM-7AM, please contact night-coverage [email or web address] 02/09/2023, 6:29 PM

## 2023-02-09 NOTE — [place] FL2 (Signed)
[place] MEDICAID FL2 LEVEL OF CARE FORM     IDENTIFICATION  Patient Name: [name] Birthdate: 1945-09-21 Sex: female Admission Date (Current Location): 02/07/2023  [facility] and [place] Number:  [employer] and Address:  [facility],  [address], [place] [address]      Provider Number: [id number]  Attending Physician Name and Address:  Shon Hale, MD  Relative Name and Phone Number:  Oletta Darter [facility])  [phone]    Current Level of Care: Hospital Recommended Level of Care: Skilled Nursing Facility Prior Approval Number:    Date Approved/Denied:   PASRR Number: [id number]  Discharge Plan: SNF    Current Diagnoses: Patient Active Problem List   Diagnosis Date Noted   Hyponatremia 02/08/2023   AKI (acute kidney injury) (HCC) 02/08/2023   Leukocytosis 02/08/2023   Acute ischemic stroke (HCC) 02/07/2023   Postoperative anemia due to acute blood loss    Subcapital fracture of hip (HCC) 04/09/2015   Hip fracture (HCC) 04/09/2015   Left displaced femoral neck fracture (HCC) 04/09/2015   Gastric ulceration    Upper GI bleed    UTI (lower urinary tract infection) 02/21/2015   Trichomonal infection 02/21/2015   Small bowel obstruction (HCC) 05/14/2013   Ventral hernia 06/03/2011   HTN (hypertension) 06/03/2011   SBO (small bowel obstruction) (HCC) 05/31/2011   Crohn's disease (HCC) 05/31/2011   Tobacco abuse 05/31/2011   Benign essential HTN 05/31/2011    Orientation RESPIRATION BLADDER Height & Weight     Self, Time, Place, Situation  Normal Continent Weight: 52.6 kg Height:  5\' 5"  (165.1 cm)  BEHAVIORAL SYMPTOMS/MOOD NEUROLOGICAL BOWEL NUTRITION STATUS      Continent Diet (See DC summary)  AMBULATORY STATUS COMMUNICATION OF NEEDS Skin   Extensive Assist Verbally Normal                       Personal Care Assistance Level of Assistance  Bathing, Feeding, Dressing Bathing Assistance: Maximum assistance Feeding  assistance: Limited assistance Dressing Assistance: Maximum assistance     Functional Limitations Info  Sight, Hearing, Speech Sight Info: Impaired Hearing Info: Adequate Speech Info: Adequate    SPECIAL CARE FACTORS FREQUENCY  PT (By licensed PT)     PT Frequency: 5 times a week              Contractures Contractures Info: Not present    Additional Factors Info  Code Status, Allergies Code Status Info: FULL Allergies Info: Flagyl           Current Medications (02/09/2023):  This is the current hospital active medication list Current Facility-Administered Medications  Medication Dose Route Frequency Provider Last Rate Last Admin   0.9 %  sodium chloride infusion   Intravenous Continuous Eber Hong, MD 100 mL/hr at 02/09/23 0431 New Bag at 02/09/23 0431   acetaminophen (TYLENOL) tablet 650 mg  650 mg Oral Q6H PRN Adefeso, Oladapo, DO   650 mg at 02/08/23 1322   Or   acetaminophen (TYLENOL) suppository 650 mg  650 mg Rectal Q6H PRN Adefeso, Oladapo, DO       aspirin EC tablet 81 mg  81 mg Oral Q breakfast Emokpae, Courage, MD   81 mg at 02/09/23 0757   atorvastatin (LIPITOR) tablet 40 mg  40 mg Oral Daily Emokpae, Courage, MD   40 mg at 02/09/23 1016   Chlorhexidine Gluconate Cloth 2 % PADS 6 each  6 each Topical Daily Adefeso, Oladapo, DO  6 each at 02/09/23 1016   clopidogrel (PLAVIX) tablet 75 mg  75 mg Oral Daily Emokpae, Courage, MD   75 mg at 02/09/23 1016   feeding supplement (ENSURE ENLIVE / ENSURE PLUS) liquid 237 mL  237 mL Oral BID BM Emokpae, Courage, MD   237 mL at 02/09/23 1016   HYDROcodone-acetaminophen (NORCO/VICODIN) 5-325 MG per tablet 1 tablet  1 tablet Oral Q6H PRN Shon Hale, MD   1 tablet at 02/09/23 1021   multivitamin with minerals tablet 1 tablet  1 tablet Oral Daily Shon Hale, MD   1 tablet at 02/09/23 1016   nicotine (NICODERM CQ - dosed in mg/24 hours) patch 21 mg  21 mg Transdermal Daily Emokpae, Courage, MD   21 mg at 02/09/23  1016   ondansetron (ZOFRAN) tablet 4 mg  4 mg Oral Q6H PRN Adefeso, Oladapo, DO       Or   ondansetron (ZOFRAN) injection 4 mg  4 mg Intravenous Q6H PRN Adefeso, Oladapo, DO       tamsulosin (FLOMAX) capsule 0.4 mg  0.4 mg Oral QPC supper Shon Hale, MD         Discharge Medications: Please see discharge summary for a list of discharge medications.  Relevant Imaging Results:  Relevant Lab Results:   Additional Information SS# [id number]  Leitha Bleak, RN

## 2023-02-09 NOTE — TOC Initial Note (Signed)
Transition of Care [facility]) - Initial/Assessment Note    Patient Details  Name: [name] MRN: [id number] Date of Birth: [birth year]  Transition of Care [facility]) CM/SW Contact:    Leitha Bleak, RN Phone Number: 02/09/2023, 2:22 PM  Clinical Narrative:       Patient admitted with  acute ischemic stroke. PT is recommending SNF, stating the patient wants [facility]. TOC completed FL2 and sent out for bed offers to discuss with patient.             Expected Discharge Plan: Skilled Nursing Facility Barriers to Discharge: Continued Medical Work up   Patient Goals and CMS Choice Patient states their goals for this hospitalization and ongoing recovery are:: agreeable to SNF CMS Medicare.gov Compare Post Acute Care list provided to:: Patient Choice offered to / list presented to : Patient      Expected Discharge Plan and Services       Living arrangements for the past 2 months: Single Family Home                     Prior Living Arrangements/Services Living arrangements for the past 2 months: Single Family Home Lives with:: Relatives Patient language and need for interpreter reviewed:: Yes        Need for Family Participation in Patient Care: Yes (Comment) Care giver support system in place?: Yes (comment)   Criminal Activity/Legal Involvement Pertinent to Current Situation/Hospitalization: No - Comment as needed  Activities of Daily Living Home Assistive Devices/Equipment: Cane (specify quad or straight), Walker (specify type) ADL Screening (condition at time of admission) Patient's cognitive ability adequate to safely complete daily activities?: Yes Is the patient deaf or have difficulty hearing?: No Does the patient have difficulty seeing, even when wearing glasses/contacts?: Yes Does the patient have difficulty concentrating, remembering, or making decisions?: Yes (forgetful) Patient able to express need for assistance with ADLs?: Yes Does the patient have difficulty dressing or  bathing?: Yes Independently performs ADLs?: No Communication: Independent Dressing (OT): Needs assistance Is this a change from baseline?: Change from baseline, expected to last >3 days Grooming: Needs assistance Is this a change from baseline?: Change from baseline, expected to last >3 days Feeding: Needs assistance Is this a change from baseline?: Change from baseline, expected to last >3 days Bathing: Needs assistance Is this a change from baseline?: Change from baseline, expected to last >3 days Toileting: Needs assistance Is this a change from baseline?: Change from baseline, expected to last >3days In/Out Bed: Needs assistance Is this a change from baseline?: Change from baseline, expected to last >3 days Walks in Home: Needs assistance Is this a change from baseline?: Change from baseline, expected to last >3 days Does the patient have difficulty walking or climbing stairs?: Yes Weakness of Legs: Left Weakness of Arms/Hands: Left  Permission Sought/Granted                  Emotional Assessment     Affect (typically observed): Accepting Orientation: : Oriented to Self, Oriented to Place, Oriented to Situation Alcohol / Substance Use: Not Applicable Psych Involvement: No (comment)  Admission diagnosis:  Hyponatremia [E87.1] Acute ischemic stroke [facility]) [I63.9] Ischemic stroke [facility]) [I63.9] Patient Active Problem List   Diagnosis Date Noted   Hyponatremia 02/08/2023   AKI (acute kidney injury) (HCC) 02/08/2023   Leukocytosis 02/08/2023   Acute ischemic stroke (HCC) 02/07/2023   Postoperative anemia due to acute blood loss    Subcapital fracture of hip (HCC) 04/09/2015  Hip fracture (HCC) 04/09/2015   Left displaced femoral neck fracture (HCC) 04/09/2015   Gastric ulceration    Upper GI bleed    UTI (lower urinary tract infection) 02/21/2015   Trichomonal infection 02/21/2015   Small bowel obstruction (HCC) 05/14/2013   Ventral hernia 06/03/2011   HTN  (hypertension) 06/03/2011   SBO (small bowel obstruction) (HCC) 05/31/2011   Crohn's disease (HCC) 05/31/2011   Tobacco abuse 05/31/2011   Benign essential HTN 05/31/2011   PCP:  Assunta Found, MD Pharmacy:   [facility] - [place], [place] - [address]  [place] [address] Phone: [phone] Fax: [phone]     Social Determinants of Health (SDOH) Social History: SDOH Screenings   Food Insecurity: No Food Insecurity (02/08/2023)  Housing: Low Risk  (02/08/2023)  Transportation Needs: No Transportation Needs (02/08/2023)  Utilities: Not At Risk (02/08/2023)  Tobacco Use: High Risk (02/07/2023)   SDOH Interventions:    Readmission Risk Interventions     No data to display

## 2023-02-10 DIAGNOSIS — I639 Cerebral infarction, unspecified: Secondary | ICD-10-CM | POA: Diagnosis not present

## 2023-02-10 LAB — BASIC METABOLIC PANEL
Anion gap: 10 (ref 5–15)
BUN: 21 mg/dL (ref 8–23)
CO2: 17 mmol/L — ABNORMAL LOW (ref 22–32)
Calcium: 7.8 mg/dL — ABNORMAL LOW (ref 8.9–10.3)
Chloride: 100 mmol/L (ref 98–111)
Creatinine, Ser: 0.78 mg/dL (ref 0.44–1.00)
GFR, Estimated: >60 mL/min (ref >60)
Glucose, Bld: 66 mg/dL — ABNORMAL LOW (ref 70–99)
Potassium: 3.3 mmol/L — ABNORMAL LOW (ref 3.5–5.1)
Sodium: 127 mmol/L — ABNORMAL LOW (ref 135–145)

## 2023-02-10 MED ORDER — SODIUM CHLORIDE 0.9 % IV SOLN: INTRAVENOUS | Status: AC

## 2023-02-10 MED ORDER — POTASSIUM CHLORIDE CRYS ER 20 MEQ PO TBCR
40.0000 meq | EXTENDED_RELEASE_TABLET | ORAL | Status: AC
  Administered 2023-02-10 (×2): 40 meq via ORAL
  Filled 2023-02-10 (×2): qty 2

## 2023-02-10 MED ORDER — METHOCARBAMOL 500 MG PO TABS
500.0000 mg | ORAL_TABLET | Freq: Three times a day (TID) | ORAL | Status: DC
  Administered 2023-02-10 – 2023-02-11 (×3): 500 mg via ORAL
  Filled 2023-02-10 (×3): qty 1

## 2023-02-10 NOTE — Progress Notes (Addendum)
Attempted calling report to [facility] Nurse said she would call back. [facility] will arrive shortly to pick her up.

## 2023-02-10 NOTE — Progress Notes (Addendum)
PROGRESS NOTE  [name], is a 77 y.o. female, DOB - 04-Oct-1945, CZY:[id number]  Admit date - 02/07/2023   Admitting Physician Frankey Shown, DO  Outpatient Primary MD for the patient is Assunta Found, MD  LOS - 3  Chief Complaint  Patient presents with   Fall      Brief Narrative:  a 77 y.o. female with medical history significant of hypertension, tobacco use admitted on 02/07/2023 with acute strokes after developing left-sided weakness and fall on 02/06/2023 -Abnormal CTA Head and Neck noted... Discussed with Vascular Surgeon Dr. Sharyn Creamer transfer to [facility] for further vascular surgery eval for possible Rt CEA this admission   -Assessment and Plan: 1)Acute Strokes in the Right MCA territory--- moderate-sized acute to subacute posterior right MCA stroke noted -Carotid Doppler with evidence of prior left carotid anatomy and extensive atheromatous plaque in the right ICA CTA neck/head as below -Echo with EF of 70 to 75% with grade 1 diastolic dysfunction, no wall motion abnormalities -A1c is 4.2 -LDL 95, HDL 51, total cholesterol 093 triglycerides of 124 -c/n Atorvastatin -Neurology consult appreciated -PT and OT eval appreciated, recommends SNF rehab -Speech eval appreciated, recommends Dysphagia 2 (Fine chop);Thin liquids -Give Aspirin/Plavix Combo--for secondary stroke Prevention (Per The multicenter SAMMPRIS trial) CTA head and neck noted--- See # 4 below -Outpatient neuro follow-up in 3 months  2)Tobacco Abuse/COPD--patient smokes close to 2 pack of cigarettes per day -No acute flareup of COPD at this time -Nicotine patch as ordered- as needed bronchodilators ordered  3)Neck stiffness and reduced range of motion of neck patient instructed to her neck to the right which is new---CT of the C-spine w/o acute findings -Suspect Myofascial (muscle spasm) related -Give Methocarbamol  4)Abnormal CTA Head and Neck--Severe, nearly occlusive stenosis in the proximal  right ICA. The lumen of the level of stenosis is difficult to visualize and likely  measures less than 1 mm of the site of greatest stenosis. -Severe focal stenosis in an M3/M4 segment of the right MCA. - No acute fracture or traumatic listhesis in the cervical spine. -Small 2 mm inferiorly projecting outpouching of the ICA terminus on the left, which could represent a small aneurysm. Discussed with Vascular Surgeon Dr. Sharyn Creamer transfer to [facility] for further vascular surgery eval for possible Rt CEA this admission Pt previously had Lt CEA in 2004 C/n Aspirin, Plavix and Atorvastatin  5)HypoNatremia--- improving with hydration Avoid excessive free water  6)Hypokalemia---replaced, recheck in am   Status is: Inpatient   Disposition: The patient is from: Home              Anticipated d/c is to: SNF              Anticipated d/c date is: 3 days              Patient currently is not medically stable to d/c. Barriers: Awaiting vascular surgery eval for possible Rt Sided CEA  Code Status :  -  Code Status: Full Code   Family Communication:   Discussed with daughter [name] and patient's sister at bedside  DVT Prophylaxis  :   - SCDs   SCDs Start: 02/08/23 0241  Lab Results  Component Value Date   PLT 300 02/08/2023   Inpatient Medications  Scheduled Meds:  aspirin EC  81 mg Oral Q breakfast   atorvastatin  40 mg Oral Daily   Chlorhexidine Gluconate Cloth  6 each Topical Daily   clopidogrel  75 mg Oral Daily   feeding  supplement  237 mL Oral BID BM   multivitamin with minerals  1 tablet Oral Daily   nicotine  21 mg Transdermal Daily   tamsulosin  0.4 mg Oral QPC supper   Continuous Infusions:  sodium chloride 100 mL/hr at 02/10/23 1334   PRN Meds:.acetaminophen **OR** acetaminophen, HYDROcodone-acetaminophen, ondansetron **OR** ondansetron (ZOFRAN) IV   Anti-infectives (From admission, onward)    None      Subjective: [name] today has no fevers, no emesis,   No chest pain,   - Resting comfortably,  Oral intake is fair -Discussed with Vascular Surgeon Dr. Sharyn Creamer transfer to [facility] for further vascular surgery eval for possible Rt CEA this admission  Objective: Vitals:   02/10/23 1114 02/10/23 1200 02/10/23 1300 02/10/23 1448  BP:  (!) 131/58  (!) 172/79  Pulse:  87 96 (!) 107  Resp:  16 19 17   Temp: 98.3 F (36.8 C)     TempSrc: Oral     SpO2:  98% 98% 99%  Weight:      Height:        Intake/Output Summary (Last 24 hours) at 02/10/2023 1509 Last data filed at 02/10/2023 0336 Gross per 24 hour  Intake --  Output 1436 ml  Net -1436 ml   Filed Weights   02/07/23 2250 02/09/23 0441 02/10/23 0459  Weight: 53.1 kg 52.6 kg 54.6 kg   Physical Exam Gen:- Awake Alert,  in no apparent distress  HEENT:- [place].AT, No sclera icterus Neck-limited range of motion of the neck, patient prefers to look to the right, no step deformity or crepitus over the C-spine on palpation, +ve Carotid bruit bilaterally Lungs-  CTAB , fair symmetrical air movement CV- S1, S2 normal, regular  Abd-  +ve B.Sounds, Abd Soft, No tenderness,    Extremity/Skin:- No  edema, pedal pulses present  Psych-affect is appropriate, oriented x3 Neuro-improving left-sided hemiparesis,  no tremors  Data Reviewed: I have personally reviewed following labs and imaging studies  CBC: Recent Labs  Lab 02/07/23 2124 02/07/23 2132 02/08/23 0549  WBC 19.1*  --  16.3*  NEUTROABS 14.3*  --   --   HGB 13.7 15.3* 11.9*  HCT 38.7 45.0 34.1*  MCV 95.3  --  96.3  PLT 363  --  300   Basic Metabolic Panel: Recent Labs  Lab 02/07/23 2124 02/07/23 2132 02/08/23 0549 02/08/23 1507 02/09/23 0437 02/10/23 0500  NA 115* 116* 118* 120* 123* 127*  K 4.7 4.9 4.1 4.4 4.2 3.3*  CL 83* 86* 89* 89* 96* 100  CO2 19*  --  17* 18* 19* 17*  GLUCOSE 115* 114* 96 97 96 66*  BUN 37* 35* 36* 35* 33* 21  CREATININE 1.39* 1.70* 1.14* 1.19* 1.02* 0.78  CALCIUM 9.1  --  8.4* 8.2*  8.2* 7.8*  MG  --   --  1.9  --   --   --   PHOS  --   --  3.4  --  2.8  --    GFR: Estimated Creatinine Clearance: 50 mL/min (by C-G formula based on SCr of 0.78 mg/dL). Liver Function Tests: Recent Labs  Lab 02/07/23 2124 02/08/23 0549 02/09/23 0437  AST 36 39  --   ALT 19 17  --   ALKPHOS 52 45  --   BILITOT 1.2 1.2  --   PROT 7.1 6.1*  --   ALBUMIN 4.1 3.6 3.1*   HbA1C: Recent Labs    02/08/23 0549  HGBA1C 4.2*  Recent Results (from the past 240 hour(s))  MRSA Next Gen by PCR, Nasal     Status: None   Collection Time: 02/08/23 12:47 AM   Specimen: Nasal Mucosa; Nasal Swab  Result Value Ref Range Status   MRSA by PCR Next Gen NOT DETECTED NOT DETECTED Final    Comment: (NOTE) The GeneXpert MRSA Assay (FDA approved for NASAL specimens only), is one component of a comprehensive MRSA colonization surveillance program. It is not intended to diagnose MRSA infection nor to guide or monitor treatment for MRSA infections. Test performance is not FDA approved in patients less than 60 years old. Performed at [facility], [address] [place], [place] [address]     Radiology Studies: CT ANGIO HEAD NECK W WO CM  Result Date: 02/09/2023 CLINICAL DATA:  Neuro deficit, acute, stroke suspected; Neck pain, acute, no red flags EXAM: CT CERVICAL SPINE WITHOUT CONTRAST CT ANGIOGRAPHY HEAD AND NECK WITH AND WITHOUT CONTRAST TECHNIQUE: Multidetector CT imaging of the cervical spine was performed without intravenous contrast. Multiplanar CT image reconstructions were also generated. Multidetector CT imaging of the head and neck was performed using the standard protocol during bolus administration of intravenous contrast. Multiplanar CT image reconstructions and MIPs were obtained to evaluate the vascular anatomy. Carotid stenosis measurements (when applicable) are obtained utilizing NASCET criteria, using the distal internal carotid diameter as the denominator. RADIATION DOSE REDUCTION:  This exam was performed according to the departmental dose-optimization program which includes automated exposure control, adjustment of the mA and/or kV according to patient size and/or use of iterative reconstruction technique. CONTRAST:  75mL OMNIPAQUE IOHEXOL 350 MG/ML SOLN COMPARISON:  None Available. FINDINGS: CT HEAD FINDINGS See same day brain MRI for intracranial findings. No contrast-enhancing lesion is visualized. There is decreased arborization in the distal right MCA territory. CT CERVICAL SPINE FINDINGS: Alignment: Straightening of the normal cervical lordosis. Trace retrolisthesis of C5 on C6. Trace anterolisthesis of C7 on T1. Skull base and vertebrae: No acute fracture. No primary bone lesion or focal pathologic process. Soft tissues and spinal canal: No prevertebral fluid or swelling. No visible canal hematoma. Disc levels:  No evidence of high-grade spinal canal stenosis Upper chest: Mild centrilobular emphysema Other: None. CTA NECK FINDINGS Aortic arch: Standard branching. Imaged portion shows no evidence of aneurysm or dissection. No significant stenosis of the major arch vessel origins. Mild narrowing of the origin of the right subclavian artery. Mild narrowing of the mid segment of the left subclavian artery. Right carotid system: There is severe, nearly occlusive stenosis in the proximal right ICA. The lumen of the level of stenosis is difficult to visualize and likely measures less than 1 mm of the site of greatest stenosis (series 19, image 33). Left carotid system: There is likely a carotid web in the proximal left ICA (series 18, image 31). Likely postsurgical changes from a prior left-sided carotid endarterectomy. Vertebral arteries: Left dominant. The right vertebral artery terminates in a PICA. No evidence of dissection, stenosis (50% or greater), or occlusion. Skeleton: See above Other neck: Negative. Upper chest: See above Review of the MIP images confirms the above findings CTA  HEAD FINDINGS Anterior circulation: There is severe focal stenosis in an M3/M4 segment of the right MCA (series 507, image 102). No proximal occlusion. There is a small 2 mm inferiorly projecting outpouching of the ICA terminus on the left (series 508, image 120), which could represent a small aneurysm. Posterior circulation: No significant stenosis, proximal occlusion, aneurysm, or vascular malformation. Venous sinuses:  As permitted by contrast timing, patent. Anatomic variants: Fetal PCA on the right. Review of the MIP images confirms the above findings IMPRESSION: 1. Severe, nearly occlusive stenosis in the proximal right ICA. The lumen of the level of stenosis is difficult to visualize and likely measures less than 1 mm of the site of greatest stenosis. 2. Severe focal stenosis in an M3/M4 segment of the right MCA. 3. No acute fracture or traumatic listhesis in the cervical spine. 4. Small 2 mm inferiorly projecting outpouching of the ICA terminus on the left, which could represent a small aneurysm. 5. See same day brain MRI for intracranial findings. 6. Emphysema. Emphysema (ICD10-J43.9). Electronically Signed   By: Lorenza Cambridge M.D.   On: 02/09/2023 18:34   CT C-SPINE NO CHARGE  Result Date: 02/09/2023 CLINICAL DATA:  Neuro deficit, acute, stroke suspected; Neck pain, acute, no red flags EXAM: CT CERVICAL SPINE WITHOUT CONTRAST CT ANGIOGRAPHY HEAD AND NECK WITH AND WITHOUT CONTRAST TECHNIQUE: Multidetector CT imaging of the cervical spine was performed without intravenous contrast. Multiplanar CT image reconstructions were also generated. Multidetector CT imaging of the head and neck was performed using the standard protocol during bolus administration of intravenous contrast. Multiplanar CT image reconstructions and MIPs were obtained to evaluate the vascular anatomy. Carotid stenosis measurements (when applicable) are obtained utilizing NASCET criteria, using the distal internal carotid diameter as the  denominator. RADIATION DOSE REDUCTION: This exam was performed according to the departmental dose-optimization program which includes automated exposure control, adjustment of the mA and/or kV according to patient size and/or use of iterative reconstruction technique. CONTRAST:  75mL OMNIPAQUE IOHEXOL 350 MG/ML SOLN COMPARISON:  None Available. FINDINGS: CT HEAD FINDINGS See same day brain MRI for intracranial findings. No contrast-enhancing lesion is visualized. There is decreased arborization in the distal right MCA territory. CT CERVICAL SPINE FINDINGS: Alignment: Straightening of the normal cervical lordosis. Trace retrolisthesis of C5 on C6. Trace anterolisthesis of C7 on T1. Skull base and vertebrae: No acute fracture. No primary bone lesion or focal pathologic process. Soft tissues and spinal canal: No prevertebral fluid or swelling. No visible canal hematoma. Disc levels:  No evidence of high-grade spinal canal stenosis Upper chest: Mild centrilobular emphysema Other: None. CTA NECK FINDINGS Aortic arch: Standard branching. Imaged portion shows no evidence of aneurysm or dissection. No significant stenosis of the major arch vessel origins. Mild narrowing of the origin of the right subclavian artery. Mild narrowing of the mid segment of the left subclavian artery. Right carotid system: There is severe, nearly occlusive stenosis in the proximal right ICA. The lumen of the level of stenosis is difficult to visualize and likely measures less than 1 mm of the site of greatest stenosis (series 19, image 33). Left carotid system: There is likely a carotid web in the proximal left ICA (series 18, image 31). Likely postsurgical changes from a prior left-sided carotid endarterectomy. Vertebral arteries: Left dominant. The right vertebral artery terminates in a PICA. No evidence of dissection, stenosis (50% or greater), or occlusion. Skeleton: See above Other neck: Negative. Upper chest: See above Review of the MIP  images confirms the above findings CTA HEAD FINDINGS Anterior circulation: There is severe focal stenosis in an M3/M4 segment of the right MCA (series 507, image 102). No proximal occlusion. There is a small 2 mm inferiorly projecting outpouching of the ICA terminus on the left (series 508, image 120), which could represent a small aneurysm. Posterior circulation: No significant stenosis, proximal occlusion, aneurysm, or vascular malformation.  Venous sinuses: As permitted by contrast timing, patent. Anatomic variants: Fetal PCA on the right. Review of the MIP images confirms the above findings IMPRESSION: 1. Severe, nearly occlusive stenosis in the proximal right ICA. The lumen of the level of stenosis is difficult to visualize and likely measures less than 1 mm of the site of greatest stenosis. 2. Severe focal stenosis in an M3/M4 segment of the right MCA. 3. No acute fracture or traumatic listhesis in the cervical spine. 4. Small 2 mm inferiorly projecting outpouching of the ICA terminus on the left, which could represent a small aneurysm. 5. See same day brain MRI for intracranial findings. 6. Emphysema. Emphysema (ICD10-J43.9). Electronically Signed   By: Lorenza Cambridge M.D.   On: 02/09/2023 18:34   ECHOCARDIOGRAM COMPLETE  Result Date: 02/08/2023    ECHOCARDIOGRAM REPORT   Patient Name:   [name] Date of Exam: 02/08/2023 Medical Rec #:  [id number]    Height:       65.0 in Accession #:    [id number]   Weight:       117.1 lb Date of Birth:  1945-07-18    BSA:          1.575 m Patient Age:    72 years     BP:           148/82 mmHg Patient Gender: F            HR:           98 bpm. [facility] Procedure: 2D Echo, Cardiac Doppler, Color Doppler and Intracardiac            Opacification Agent Indications:    Stroke l63.9  History:        Patient has no prior history of Echocardiogram examinations.                 Risk Factors:Hypertension and Current Smoker.  Sonographer:    Celesta Gentile RCS  Referring Phys: [id number] OLADAPO ADEFESO  Sonographer Comments: Technically difficult study due to poor echo windows. IMPRESSIONS  1. Left ventricular ejection fraction, by estimation, is 70 to 75%. The left ventricle has hyperdynamic function. The left ventricle has no regional wall motion abnormalities. There is mild asymmetric left ventricular hypertrophy of the basal-septal segment. Left ventricular diastolic parameters are consistent with Grade I diastolic dysfunction (impaired relaxation).  2. Right ventricular systolic function is normal. The right ventricular size is normal.  3. The mitral valve was not well visualized. No evidence of mitral valve regurgitation.  4. The aortic valve was not well visualized. There is mild calcification of the aortic valve. Aortic valve regurgitation is not visualized. Comparison(s): No prior Echocardiogram. FINDINGS  Left Ventricle: Left ventricular ejection fraction, by estimation, is 70 to 75%. The left ventricle has hyperdynamic function. The left ventricle has no regional wall motion abnormalities. Definity contrast agent was given IV to delineate the left ventricular endocardial borders. The left ventricular internal cavity size was normal in size. There is mild asymmetric left ventricular hypertrophy of the basal-septal segment. Left ventricular diastolic parameters are consistent with Grade I diastolic dysfunction (impaired relaxation). Right Ventricle: The right ventricular size is normal. No increase in right ventricular wall thickness. Right ventricular systolic function is normal. Left Atrium: Left atrial size was normal in size. Right Atrium: Right atrial size was normal in size. Pericardium: The pericardium was not well visualized. Mitral Valve: The mitral valve was not well visualized. No evidence of mitral valve  regurgitation. Tricuspid Valve: The tricuspid valve is grossly normal. Tricuspid valve regurgitation is not demonstrated. No evidence of tricuspid  stenosis. Aortic Valve: The aortic valve was not well visualized. There is mild calcification of the aortic valve. Aortic valve regurgitation is not visualized. Pulmonic Valve: The pulmonic valve was not well visualized. Pulmonic valve regurgitation is not visualized. No evidence of pulmonic stenosis. Aorta: The ascending aorta was not well visualized, the aortic arch was not well visualized and the aortic root was not well visualized. IAS/Shunts: The interatrial septum was not well visualized.  LEFT VENTRICLE PLAX 2D LVIDd:         2.60 cm   Diastology LVIDs:         2.00 cm   LV e' medial:    4.24 cm/s LV PW:         0.90 cm   LV E/e' medial:  11.8 LV IVS:        1.10 cm   LV e' lateral:   7.29 cm/s LVOT diam:     1.70 cm   LV E/e' lateral: 6.9 LV SV:         33 LV SV Index:   21 LVOT Area:     2.27 cm  RIGHT VENTRICLE RV S prime:     26.90 cm/s TAPSE (M-mode): 1.7 cm LEFT ATRIUM           Index        RIGHT ATRIUM          Index LA diam:      2.00 cm 1.27 cm/m   RA Area:     8.06 cm LA Vol (A2C): 17.4 ml 11.04 ml/m  RA Volume:   12.70 ml 8.06 ml/m LA Vol (A4C): 13.5 ml 8.57 ml/m  AORTIC VALVE LVOT Vmax:   95.40 cm/s LVOT Vmean:  59.800 cm/s LVOT VTI:    0.146 m  AORTA Ao Root diam: 2.60 cm MITRAL VALVE                TRICUSPID VALVE MV Area (PHT): 1.27 cm     TR Peak grad:   28.7 mmHg MV Decel Time: 599 msec     TR Vmax:        268.00 cm/s MV E velocity: 50.10 cm/s MV A velocity: 106.00 cm/s  SHUNTS MV E/A ratio:  0.47         Systemic VTI:  0.15 m                             Systemic Diam: 1.70 cm Riley Lam MD Electronically signed by Riley Lam MD Signature Date/Time: 02/08/2023/5:04:37 PM    Final     Scheduled Meds:  aspirin EC  81 mg Oral Q breakfast   atorvastatin  40 mg Oral Daily   Chlorhexidine Gluconate Cloth  6 each Topical Daily   clopidogrel  75 mg Oral Daily   feeding supplement  237 mL Oral BID BM   multivitamin with minerals  1 tablet Oral Daily   nicotine  21  mg Transdermal Daily   tamsulosin  0.4 mg Oral QPC supper   Continuous Infusions:  sodium chloride 100 mL/hr at 02/10/23 1334    LOS: 3 days   Shon Hale M.D on 02/10/2023 at 3:09 PM  Go to [email or web address] - for contact info  [facility] - Office  [phone]  If 7PM-7AM, please contact night-coverage [email or web address] 02/10/2023, 3:09 PM

## 2023-02-10 NOTE — Consult Note (Addendum)
Hospital Consult  VASCULAR SURGERY ASSESSMENT & PLAN:   SYMPTOMATIC RIGHT CAROTID STENOSIS: This patient has a tight right carotid stenosis by CT angio and has had a significant right hemispheric stroke associated with profound left upper extremity and left lower extremity weakness.  In addition she does have some intracranial disease in the M3 and M4 segment of the right MCA.  Her MRI of the brain shows a moderate-sized acute posterior right MCA infarct.  She is now on aspirin, Plavix, and a statin.  In addition we have discussed the importance of tobacco cessation.  I have discussed the case with Dr. Pearlean Brownie.  Currently given the profound left-sided weakness we agree that it would be best to see if she makes any significant clinical improvement before considering right carotid endarterectomy.  If she shows significant clinical improvement and can go to rehab we could consider carotid endarterectomy in 2-3 weeks.   Of note, she does have a focal stenosis at the distal aspect of her carotid endarterectomy patch that was done in 2003 by Dr. Hart Rochester.  We will need to follow this with duplex.  Vascular surgery will follow.  Cari Caraway, MD 5:10 PM   Reason for Consult:  symptomatic carotid artery stenosis Requesting Physician:  Mariea Clonts MRN #:  [id number]  History of Present Illness: This is a 77 y.o. female who presented to the hospital [facility]) with left sided weakness and found to have symptomatic right carotid artery stenosis.  She has hx of left CEA on 05/08/2002 by Dr. Hart Rochester.    She states that last Friday, her daughter helped her up to the potty and then she started having her left arm and leg start shaking.  Her daughter returned and pt states they both fell.  She was taken to the hospital after fall.  She states she had a low potassium.   She states that she did have trouble speaking.  She states she had temporary blindness in both eyes at the same time but cannot tell me when this  was.  She smokes about 1.5 ppd.  She denies hx of MI but has family hx of MI.  She states she had some chest pain last year while feeding her cats and she went inside to get an aspirin and this did help.  She states she has not had anymore chest pain.  She does not have a cardiologist.  She denies any claudication.  She states she gets cramps in her calves at night.  She denies any rest pain in her feet.  She denies any family hx of aneurysms.    She has had significant hyponatremia that is being corrected-her sodium this am was up to 127.   U/a yesterday revealed rare bacteria and budding yeast present with trace leukocytes.   Her nephew died last year of apparent overdose.  He was 78 y/o.  The pt is on a statin for cholesterol management.  The pt is on a daily aspirin.   Other AC:  Plavix  The pt is not on medication for hypertension.   The pt is not on medication for diabetes PTA. Tobacco hx:  current  Past Medical History:  Diagnosis Date   Anemia, unspecified    Arthritis    Crohn's disease (HCC)    Degenerative disc disease, lumbar    Hypertension    Sciatica of right side    Stroke (HCC)    Ventral hernia     Past Surgical History:  Procedure Laterality Date  CAROTID ENDARTERECTOMY     2004   CHOLECYSTECTOMY     2008   ESOPHAGOGASTRODUODENOSCOPY N/A 02/24/2015   Procedure: ESOPHAGOGASTRODUODENOSCOPY (EGD);  Surgeon: Corbin Ade, MD;  Location: [facility];  Service: Endoscopy;  Laterality: N/A;   HEMICOLECTOMY     right   HIP ARTHROPLASTY Left 04/09/2015   Procedure: INSERT PARTIAL HIP REPLACEMENT LEFT;  Surgeon: Vickki Hearing, MD;  Location: [facility];  Service: Orthopedics;  Laterality: Left;   INCISIONAL HERNIA REPAIR N/A 05/20/2013   Procedure: Sherald Hess HERNIORRHAPHY WITH MESH;  Surgeon: Dalia Heading, MD;  Location: [facility];  Service: General;  Laterality: N/A;   INCISIONAL HERNIA REPAIR N/A 05/19/2014   Procedure: RECURRENT HERNIA REPAIR INCISIONAL;   Surgeon: Dalia Heading, MD;  Location: [facility];  Service: General;  Laterality: N/A;   INSERTION OF MESH N/A 05/20/2013   Procedure: INSERTION OF MESH;  Surgeon: Dalia Heading, MD;  Location: [facility];  Service: General;  Laterality: N/A;   INSERTION OF MESH N/A 05/19/2014   Procedure: INSERTION OF MESH;  Surgeon: Dalia Heading, MD;  Location: [facility];  Service: General;  Laterality: N/A;   TUBAL LIGATION      Allergies  Allergen Reactions   Flagyl [Metronidazole Hcl] Other (See Comments)    "makes me feel whoozy and weird"   Other     Unknown antibiotic and unknown pain medications     Prior to Admission medications   Medication Sig Start Date End Date Taking? Authorizing Provider  acetaminophen (TYLENOL) 500 MG tablet Take 1 tablet (500 mg total) by mouth every 6 (six) hours as needed for mild pain. 04/12/15  Yes Standley Brooking, MD  aspirin EC 81 MG tablet Take 1 tablet (81 mg total) by mouth daily. Resume in 1 week Patient taking differently: Take 81 mg by mouth daily. 02/25/15  Yes Erick Blinks, MD  diphenhydrAMINE (BENADRYL) 25 mg capsule Take 25 mg by mouth every 6 (six) hours as needed for itching or sleep.   Yes [provider]  diphenhydrAMINE (SOMINEX) 25 MG tablet Take 25 mg by mouth at bedtime as needed for sleep.   Yes [provider]  folic acid (FOLVITE) 1 MG tablet TAKE ONE TABLET BY MOUTH ONCE DAILY. 11/13/22  Yes Carlan, Chelsea L, NP  lisinopril (PRINIVIL,ZESTRIL) 10 MG tablet Take 10 mg by mouth daily. 02/19/16  Yes [provider]  ondansetron (ZOFRAN-ODT) 4 MG disintegrating tablet Take 4 mg by mouth every 6 (six) hours as needed. 12/22/19  Yes [provider]  sulfaSALAzine (AZULFIDINE) 500 MG tablet TAKE (2) TABLETS BY MOUTH TWICE DAILY. 11/13/22  Yes Carlan, Chelsea L, NP  traMADol-acetaminophen (ULTRACET) 37.5-325 MG tablet Take 1 tablet by mouth every 6 (six) hours as needed for moderate pain or severe pain. 02/05/23  Yes  [provider]    Social History   Socioeconomic History   Marital status: Widowed    Spouse name: Not on file   Number of children: Not on file   Years of education: Not on file   Highest education level: Not on file  Occupational History   Not on file  Tobacco Use   Smoking status: Every Day    Current packs/day: 1.50    Average packs/day: 1.5 packs/day for 30.0 years (45.0 ttl pk-yrs)    Types: Cigarettes   Smokeless tobacco: Never   Tobacco comments:    1-2 pack day x 20 yrs.  Vaping Use   Vaping status:  Never Used  Substance and Sexual Activity   Alcohol use: No    Alcohol/week: 1.0 standard drink of alcohol    Types: 1 Standard drinks or equivalent per week    Comment: one every 2 -3 months.   Drug use: No   Sexual activity: Not on file  Other Topics Concern   Not on file  Social History Narrative   Not on file   Social Determinants of Health   Financial Resource Strain: Not on file  Food Insecurity: No Food Insecurity (02/08/2023)   Hunger Vital Sign    Worried About Running Out of Food in the Last Year: Never true    Ran Out of Food in the Last Year: Never true  Transportation Needs: No Transportation Needs (02/08/2023)   PRAPARE - Administrator, Civil Service (Medical): No    Lack of Transportation (Non-Medical): No  Physical Activity: Not on file  Stress: Not on file  Social Connections: Not on file  Intimate Partner Violence: Not At Risk (02/08/2023)   Humiliation, Afraid, Rape, and Kick questionnaire    Fear of Current or Ex-Partner: No    Emotionally Abused: No    Physically Abused: No    Sexually Abused: No    Family History  Problem Relation Age of Onset   GI problems Father    Stroke Brother    Heart attack Mother    Hypotension Sister     ROS: [x]  Positive   [ ]  Negative   [ ]  All sytems reviewed and are negative  Cardiac: []  chest pain/pressure []  hx MI []  SOB   Vascular: []  pain in legs while walking []   pain in legs at rest []  pain in legs at night []  non-healing ulcers  Pulmonary: []  asthma/wheezing []  home O2  Neurologic: [x]  hx of CVA []  mini stroke   Hematologic: []  hx of cancer  Endocrine:   []  diabetes []  thyroid disease  GI []  GERD  GU: []  CKD/renal failure []  HD--[]  M/W/F or []  T/T/S  Psychiatric: []  anxiety []  depression  Musculoskeletal: [x]  arthritis [x]  DDD  Integumentary: []  bruising bilateral arms  Constitutional: []  fever  []  chills  Physical Examination  Vitals:   02/10/23 1300 02/10/23 1448  BP:  (!) 172/79  Pulse: 96 (!) 107  Resp: 19 17  Temp:    SpO2: 98% 99%   Body mass index is 20.03 kg/m.  General:  WDWN in NAD Gait: Not observed HENT: WNL, normocephalic; facial droop present Pulmonary: normal non-labored breathing Cardiac: regular, with carotid bruit on the right Abdomen:  soft, NT; aortic pulse is not palpable Skin: without rashes Vascular Exam/Pulses:  Right Left  Radial 2+ (normal) 2+ (normal)  Femoral 2+ (normal) 2+ (normal)  Popliteal Unable to palpate Unable to palpate  DP 2+ (normal) Unable to palpate  PT Unable to palpate Unable to palpate   Extremities: LUE and LLE weakness Musculoskeletal: no muscle wasting or atrophy  Neurologic: A&O X 3 Psychiatric:  The pt has Normal affect.   CBC    Component Value Date/Time   WBC 16.3 (H) 02/08/2023 0549   RBC 3.54 (L) 02/08/2023 0549   HGB 11.9 (L) 02/08/2023 0549   HCT 34.1 (L) 02/08/2023 0549   PLT 300 02/08/2023 0549   MCV 96.3 02/08/2023 0549   MCH 33.6 02/08/2023 0549   MCHC 34.9 02/08/2023 0549   RDW 11.5 02/08/2023 0549   LYMPHSABS 3.2 02/07/2023 2124   MONOABS 1.4 (H) 02/07/2023 2124  EOSABS 0.0 02/07/2023 2124   BASOSABS 0.0 02/07/2023 2124    BMET    Component Value Date/Time   NA 127 (L) 02/10/2023 0500   K 3.3 (L) 02/10/2023 0500   CL 100 02/10/2023 0500   CO2 17 (L) 02/10/2023 0500   GLUCOSE 66 (L) 02/10/2023 0500   BUN 21  02/10/2023 0500   CREATININE 0.78 02/10/2023 0500   CREATININE 1.08 (H) 12/03/2021 1040   CALCIUM 7.8 (L) 02/10/2023 0500   GFRNONAA >60 02/10/2023 0500   GFRAA >60 07/07/2015 1255    COAGS: Lab Results  Component Value Date   INR 1.0 02/07/2023   INR 1.01 04/09/2015     Non-Invasive Vascular Imaging:   CTA head/neck 02/09/2023 IMPRESSION: 1. Severe, nearly occlusive stenosis in the proximal right ICA. The lumen of the level of stenosis is difficult to visualize and likely measures less than 1 mm of the site of greatest stenosis. 2. Severe focal stenosis in an M3/M4 segment of the right MCA. 3. No acute fracture or traumatic listhesis in the cervical spine. 4. Small 2 mm inferiorly projecting outpouching of the ICA terminus on the left, which could represent a small aneurysm. 5. See same day brain MRI for intracranial findings. 6. Emphysema.    ASSESSMENT/PLAN: This is a 77 y.o. female with symptomatic right carotid artery stenosis with hx of left CEA on 05/08/2002 by Dr. Hart Rochester.    -pt with significant left sided weakness found to have nearly occlusive right ICA stenosis.  She is currently on asa/statin/Plavix.  -Dr. Edilia Bo to evaluate pt and determine further plan.  Will need right carotid intervention-would benefit from neurology consult to assist with timing of surgery as CTA was pending yesterday at time of neuro consult at [facility] -she does have significant smoking hx-discussed importance of smoking cessation   Doreatha Massed, PA-C [facility] [phone]

## 2023-02-10 NOTE — Plan of Care (Signed)
  Problem: Education: Goal: Knowledge of General Education information will improve Description Including pain rating scale, medication(s)/side effects and non-pharmacologic comfort measures Outcome: Progressing   

## 2023-02-10 NOTE — Progress Notes (Signed)
Physical Therapy Treatment Patient Details Name: [name] MRN: [id number] DOB: [birth year] Today's Date: 02/10/2023   History of Present Illness [name] is a 77 y.o. female with medical history significant of hypertension, tobacco use who presents to the emergency Dept via EMS due to a fall at home.  Patient was unable to provide history, history was provided by daughter-in-law and EDP.  Per report, patient fell off of her bed yesterday and was unable to get up off the ground, EMS was called and patient was helped back to bed.  Shortly after this, patient was said to develop left sided weakness by daughter-in-law, but patient did not want to go to the ED.  Patient was unable to get out of bed since she fell off the bed (about 36 hours) and was also noted to have a subtle left-sided facial droop. There was no report of fever, headache, shortness of breath, coughing or abdominal pain.    PT Comments  Patient able to partially lift LUE against gravity, but unable to grip RW with left hand due to weakness.  Patent unable to use RW for transferring to left hand slipping off walker and constant left lateral leaning with loss of balance.  Patient required stand pivot with knees blocked to transfer to chair and tolerated staying in chair after therapy - RN aware.  Patient will benefit from continued skilled physical therapy in hospital and recommended venue below to increase strength, balance, endurance for safe ADLs and gait.     If plan is discharge home, recommend the following: A lot of help with bathing/dressing/bathroom;A lot of help with walking and/or transfers;Help with stairs or ramp for entrance;Assistance with cooking/housework   Can travel by private vehicle     No  Equipment Recommendations  None recommended by PT    Recommendations for Other Services       Precautions / Restrictions Precautions Precautions: Fall Restrictions Weight Bearing Restrictions: No     Mobility   Bed Mobility Overal bed mobility: Needs Assistance Bed Mobility: Supine to Sit     Supine to sit: Mod assist, Max assist     General bed mobility comments: limited use of left side    Transfers Overall transfer level: Needs assistance Equipment used: 1 person hand held assist Transfers: Sit to/from Stand Sit to Stand: Max assist, Mod assist Stand pivot transfers: Max assist         General transfer comment: Patient unable to transfer using RW due to left lateral leaning and poor left hand grip    Ambulation/Gait                   Stairs             Wheelchair Mobility     Tilt Bed    Modified Rankin (Stroke Patients Only)       Balance Overall balance assessment: Needs assistance Sitting-balance support: Feet supported, No upper extremity supported Sitting balance-Leahy Scale: Poor Sitting balance - Comments: fair/poor seated at EOB Postural control: Left lateral lean, Posterior lean Standing balance support: Reliant on assistive device for balance, Single extremity supported Standing balance-Leahy Scale: Poor Standing balance comment: using RW                            Cognition Arousal: Alert Behavior During Therapy: WFL for tasks assessed/performed Overall Cognitive Status: Within Functional Limits for tasks assessed  Exercises General Exercises - Lower Extremity Long Arc Quad: Seated, AAROM, AROM, Strengthening, Both, 10 reps Hip Flexion/Marching: Seated, AAROM, Strengthening, Both, 5 reps Toe Raises: Seated, AROM, Right, 10 reps Heel Raises: Seated, AROM, AAROM, 10 reps, Both    General Comments        Pertinent Vitals/Pain Pain Assessment Pain Assessment: No/denies pain    Home Living                          Prior Function            PT Goals (current goals can now be found in the care plan section) Acute Rehab PT Goals Patient Stated  Goal: return home after rehab PT Goal Formulation: With patient/family Time For Goal Achievement: 02/23/23 Potential to Achieve Goals: Fair Progress towards PT goals: Progressing toward goals    Frequency    Min 3X/week      PT Plan      Co-evaluation              AM-PAC PT "6 Clicks" Mobility   Outcome Measure  Help needed turning from your back to your side while in a flat bed without using bedrails?: A Lot Help needed moving from lying on your back to sitting on the side of a flat bed without using bedrails?: A Lot Help needed moving to and from a bed to a chair (including a wheelchair)?: A Lot Help needed standing up from a chair using your arms (e.g., wheelchair or bedside chair)?: A Lot Help needed to walk in hospital room?: A Lot Help needed climbing 3-5 steps with a railing? : Total 6 Click Score: 11    End of Session   Activity Tolerance: Patient tolerated treatment well;Patient limited by fatigue Patient left: in chair;with call bell/phone within reach;with chair alarm set Nurse Communication: Mobility status PT Visit Diagnosis: Unsteadiness on feet (R26.81);Other abnormalities of gait and mobility (R26.89);Muscle weakness (generalized) (M62.81)     Time: 1107-1130 PT Time Calculation (min) (ACUTE ONLY): 23 min  Charges:    $Therapeutic Exercise: 8-22 mins $Therapeutic Activity: 8-22 mins PT General Charges $$ ACUTE PT VISIT: 1 Visit                     2:16 PM, 02/10/23 Ocie Bob, MPT Physical Therapist with [facility] [phone] office [phone] mobile phone

## 2023-02-11 DIAGNOSIS — I639 Cerebral infarction, unspecified: Secondary | ICD-10-CM | POA: Diagnosis not present

## 2023-02-11 LAB — RENAL FUNCTION PANEL
Albumin: 2.8 g/dL — ABNORMAL LOW (ref 3.5–5.0)
Anion gap: 13 (ref 5–15)
BUN: 17 mg/dL (ref 8–23)
CO2: 13 mmol/L — ABNORMAL LOW (ref 22–32)
Calcium: 8.1 mg/dL — ABNORMAL LOW (ref 8.9–10.3)
Chloride: 104 mmol/L (ref 98–111)
Creatinine, Ser: 1.28 mg/dL — ABNORMAL HIGH (ref 0.44–1.00)
GFR, Estimated: 43 mL/min — ABNORMAL LOW (ref >60)
Glucose, Bld: 84 mg/dL (ref 70–99)
Phosphorus: 2.2 mg/dL — ABNORMAL LOW (ref 2.5–4.6)
Potassium: 4.3 mmol/L (ref 3.5–5.1)
Sodium: 130 mmol/L — ABNORMAL LOW (ref 135–145)

## 2023-02-11 LAB — CBC
HCT: 28.7 % — ABNORMAL LOW (ref 36.0–46.0)
Hemoglobin: 9.4 g/dL — ABNORMAL LOW (ref 12.0–15.0)
MCH: 34.2 pg — ABNORMAL HIGH (ref 26.0–34.0)
MCHC: 32.8 g/dL (ref 30.0–36.0)
MCV: 104.4 fL — ABNORMAL HIGH (ref 80.0–100.0)
Platelets: 226 10*3/uL (ref 150–400)
RBC: 2.75 MIL/uL — ABNORMAL LOW (ref 3.87–5.11)
RDW: 12 % (ref 11.5–15.5)
WBC: 14.8 10*3/uL — ABNORMAL HIGH (ref 4.0–10.5)
nRBC: 0 % (ref 0.0–0.2)

## 2023-02-11 MED ORDER — METHOCARBAMOL 500 MG PO TABS: 500.0000 mg | ORAL_TABLET | Freq: Three times a day (TID) | ORAL | Status: DC | PRN

## 2023-02-11 MED ORDER — SODIUM BICARBONATE 650 MG PO TABS
650.0000 mg | ORAL_TABLET | Freq: Two times a day (BID) | ORAL | Status: DC
  Administered 2023-02-11 – 2023-02-13 (×5): 650 mg via ORAL
  Filled 2023-02-11 (×6): qty 1

## 2023-02-11 NOTE — Plan of Care (Signed)

## 2023-02-11 NOTE — Progress Notes (Signed)
  VASCULAR SURGERY ASSESSMENT & PLAN:   SYMPTOMATIC RIGHT CAROTID STENOSIS: This patient has a tight right carotid stenosis by CT angio and has had a significant right hemispheric stroke associated with profound left upper extremity and left lower extremity weakness.  This morning she is confused.  Currently she is not a candidate for right carotid endarterectomy unless she shows significant improvement during rehab.  I will follow peripherally.  She is on aspirin, Plavix, and a statin.  SUBJECTIVE:   Confused this morning.  PHYSICAL EXAM:   Vitals:   02/10/23 1613 02/10/23 2022 02/10/23 2357 02/11/23 0348  BP: (!) 145/58 124/71 126/69 107/60  Pulse: 90 90 96 98  Resp:  18 18 19   Temp: 98.5 F (36.9 C) 98 F (36.7 C) 97.9 F (36.6 C) 98.1 F (36.7 C)  TempSrc: Oral Oral Oral Oral  SpO2: 97% 100% 99% 99%  Weight:      Height:       No improvement in left-sided weakness.  LABS:   Lab Results  Component Value Date   WBC 16.3 (H) 02/08/2023   HGB 11.9 (L) 02/08/2023   HCT 34.1 (L) 02/08/2023   MCV 96.3 02/08/2023   PLT 300 02/08/2023   PROBLEM LIST:    Principal Problem:   Acute ischemic stroke [facility]) Active Problems:   Benign essential HTN   Hyponatremia   AKI (acute kidney injury) (HCC)   Leukocytosis   CURRENT MEDS:    aspirin EC  81 mg Oral Q breakfast   atorvastatin  40 mg Oral Daily   Chlorhexidine Gluconate Cloth  6 each Topical Daily   clopidogrel  75 mg Oral Daily   feeding supplement  237 mL Oral BID BM   methocarbamol  500 mg Oral TID   multivitamin with minerals  1 tablet Oral Daily   nicotine  21 mg Transdermal Daily   tamsulosin  0.4 mg Oral QPC supper    Waverly Ferrari Office: [phone] 02/11/2023

## 2023-02-11 NOTE — Progress Notes (Addendum)
Occupational Therapy Treatment Patient Details Name: [name] MRN: [id number] DOB: 11/05/1945 Today's Date: 02/11/2023   History of present illness [name] is a 77 y.o. female with medical history significant of hypertension, tobacco use who presents to the emergency Dept via EMS due to a fall at home.  Patient was unable to provide history, history was provided by daughter-in-law and EDP.  Per report, patient fell off of her bed yesterday and was unable to get up off the ground, EMS was called and patient was helped back to bed.  Shortly after this, patient was said to develop left sided weakness by daughter-in-law, but patient did not want to go to the ED.  Patient was unable to get out of bed since she fell off the bed (about 36 hours) and was also noted to have a subtle left-sided facial droop. There was no report of fever, headache, shortness of breath, coughing or abdominal pain.   OT comments  Pt making progress with functional goals. Pt in bed upon arrival leaning to R side attempting to eat her breakfast. Pt required max A to sit EOB, mod A for balance sitting EOB.  Pt sat EOB ~6 minutes for eating oatmeal and drinking coffee with min A to initiate with mod verbal cues. Pt returned to supine in bed and positioned pt in midline with pillows with HOB raised 75 degrees during self feeding and engadging with pt on L side to faciliate L side attention. Pt tolerated L UE PROM in multiple planes. OT will continue to follow acutely to maximize level of function and safety       If plan is discharge home, recommend the following:  A lot of help with walking and/or transfers;A lot of help with bathing/dressing/bathroom;Assistance with cooking/housework;Assist for transportation;Help with stairs or ramp for entrance;Assistance with feeding   Equipment Recommendations  Other (comment) (defer)    Recommendations for Other Services      Precautions / Restrictions Precautions Precautions:  Fall;Other (comment) Restrictions Weight Bearing Restrictions: No       Mobility Bed Mobility Overal bed mobility: Needs Assistance Bed Mobility: Supine to Sit     Supine to sit: Mod assist     General bed mobility comments: limited use of left side/ L UE. Pt sat EOB for grooming and drinking coffee with mod A for balance/support. Pt leaning to R side lying in bed upon entering room    Transfers                         Balance Overall balance assessment: Needs assistance Sitting-balance support: Feet supported, No upper extremity supported Sitting balance-Leahy Scale: Poor Sitting balance - Comments: seated at EOB to participate in washing face and drinking coffee, eating oatmeal                                   ADL either performed or assessed with clinical judgement   ADL Overall ADL's : Needs assistance/impaired Eating/Feeding: Set up;Minimal assistance;Sitting;Bed level Eating/Feeding Details (indicate cue type and reason): sat EOB ~6 minutes for eating oatmeal and drinking coffee. Continued with feeding at bed level with HOB raised 75 degrees   Grooming Details (indicate cue type and reason): sat EOB ~6 minutes for washing face  General ADL Comments: Positioned pt in midline with pillows supine in bed with HOB raised during self feeding and engadging with pt on L side to faciliate L side attention    Extremity/Trunk Assessment Upper Extremity Assessment LUE Deficits / Details: Able to grip washcloth with assist, no active movement at elbow, wrist, or shoulder observed, PROM WFL LUE Coordination: decreased fine motor;decreased gross motor            Vision Baseline Vision/History: 1 Wears glasses;3 Glaucoma Ability to See in Adequate Light: 1 Impaired Patient Visual Report: Blurring of vision     Perception     Praxis      Cognition Arousal: Alert   Overall Cognitive Status: Within  Functional Limits for tasks assessed                                          Exercises      Shoulder Instructions       General Comments      Pertinent Vitals/ Pain       Pain Assessment Pain Assessment: No/denies pain  Home Living                                          Prior Functioning/Environment              Frequency  Min 2X/week        Progress Toward Goals  OT Goals(current goals can now be found in the care plan section)  Progress towards OT goals: Progressing toward goals     Plan      Co-evaluation                 AM-PAC OT "6 Clicks" Daily Activity     Outcome Measure   Help from another person eating meals?: A Little Help from another person taking care of personal grooming?: A Little Help from another person toileting, which includes using toliet, bedpan, or urinal?: Total Help from another person bathing (including washing, rinsing, drying)?: Total Help from another person to put on and taking off regular upper body clothing?: Total Help from another person to put on and taking off regular lower body clothing?: Total 6 Click Score: 10    End of Session    OT Visit Diagnosis: Other abnormalities of gait and mobility (R26.89);Muscle weakness (generalized) (M62.81);History of falling (Z91.81);Other symptoms and signs involving the nervous system (R29.898);Hemiplegia and hemiparesis;Unsteadiness on feet (R26.81) Hemiplegia - Right/Left: Left Hemiplegia - dominant/non-dominant: Non-Dominant Hemiplegia - caused by: Cerebral infarction   Activity Tolerance Patient tolerated treatment well   Patient Left with call bell/phone within reach;in bed;with bed alarm set   Nurse Communication          Time: [id number] OT Time Calculation (min): 25 min  Charges: OT General Charges $OT Visit: 1 Visit OT Treatments $Self Care/Home Management : 8-22 mins $Therapeutic Activity: 8-22  mins    Galen Manila 02/11/2023, 1:49 PM

## 2023-02-11 NOTE — TOC Progression Note (Signed)
Transition of Care [facility]) - Progression Note    Patient Details  Name: [name] MRN: [id number] Date of Birth: 1946/03/31  Transition of Care [facility]) CM/SW Contact  Baldemar Lenis, [place] Phone Number: 02/11/2023, 4:28 PM  Clinical Narrative:   CSW spoke with patient's daughter, [name], to discuss SNF offers. Daughter discussed with family in the background and ultimately decided on [facility]. CSW confirmed with [facility] that they'd have a bed available for patient, and CSW requested CMA to initiate insurance authorization. Authorization pending at this time. CSW to follow.    Expected Discharge Plan: Skilled Nursing Facility Barriers to Discharge: Continued Medical Work up  Expected Discharge Plan and Services       Living arrangements for the past 2 months: Single Family Home                                       Social Determinants of Health (SDOH) Interventions SDOH Screenings   Food Insecurity: No Food Insecurity (02/08/2023)  Housing: Low Risk  (02/08/2023)  Transportation Needs: No Transportation Needs (02/08/2023)  Utilities: Not At Risk (02/08/2023)  Tobacco Use: High Risk (02/07/2023)    Readmission Risk Interventions     No data to display

## 2023-02-11 NOTE — Progress Notes (Signed)
HOSPITALIST ROUNDING NOTE CHARLISA SCHOR OZD:[id number]  DOB: 11/23/1945  DOA: 02/07/2023  PCP: Assunta Found, MD  02/11/2023,3:32 PM   LOS: 4 days      Code Status: Full code   From: Home  current Dispo: Likely pending nursing center     77 year old white female history of Crohn's diagnosed in 2006, prior CVA 2003 with severe LICA stenosis status post Dacron patch angioplasty Dr. Jaynie Collins, HTN, DDD Prior incarcerated partial small bowel obstruction status post incisional herniorrhaphy + mesh 2014 Dr. Lovell Sheehan Known continued tobacco  Apparently patient fell off of her bed 8/24 developed left-sided weakness and stayed at home for 36 hours and came in to [facility], ED has been unable to get out of bed CT head showed right posterior lobe decreased attenuation with subacute infarct CCM was consulted and patient was managed at [facility] Sodium was 115 potassium 4.5 BUN/creatinine 37/1.3 WBC 19 8/25 echo showed grade 1 diastolic dysfunction with EF 70-75% and carotid duplex showed blockages 8/27-CTA head neck abnormal showing severe focal stenosis M3-M4 segment and nearly occlusive stenosis in proximal right ICA and decision made to transfer patient to [facility]  Plan  Severe right ICA stenosis Poor current candidate for right carotid endarterectomy at this time but may be a better candidate moving forward if to space well with therapy etc. Continues on aspirin 81, Plavix 75 etc.  AKI on admission with severe hyponatremia Hyponatremia is resolving well AKI is also resolving some She has a renal acidosis-starting bicarb 650 twice daily  Neck stiffness CT C-spine without acute findings-myofascial spasm-Taper methocarbamol and changed to as needed as do not want the patient confused  Hypokalemia Now resolved-stop replacement of potassium  2 pack/day smoker No wheeze Unlikely to quit  Leukocytosis etiology unclear White count is trended down on its own from 19-14 Given  probable stroke we will obtain an x-ray in the morning and place on incentive spirometer Hopeful that this is not mnemonic  Acute urinary retention Retaining over 300 cc urine 8/28 Foley catheter placed   DVT prophylaxis: SCD  Status is: Inpatient Remains inpatient appropriate because:  Requires skilled facility prefers [facility]    Subjective: Dense deficit on left side unable to raise arm well   Objective + exam Vitals:   02/10/23 2357 02/11/23 0348 02/11/23 0700 02/11/23 1100  BP: 126/69 107/60 97/74 100/77  Pulse: 96 98 100 98  Resp: 18 19 19 18   Temp: 97.9 F (36.6 C) 98.1 F (36.7 C) 98.1 F (36.7 C) 98 F (36.7 C)  TempSrc: Oral Oral Oral Oral  SpO2: 99% 99% 97% 99%  Weight:      Height:       Filed Weights   02/07/23 2250 02/09/23 0441 02/10/23 0459  Weight: 53.1 kg 52.6 kg 54.6 kg    Examination:  EOMI NCAT no focal deficit Not as restricted on the right side Neck soft supple Abdomen soft no rebound S1-S2 no murmur No lower extremity edema   Data Reviewed: reviewed   CBC    Component Value Date/Time   WBC 14.8 (H) 02/11/2023 0701   RBC 2.75 (L) 02/11/2023 0701   HGB 9.4 (L) 02/11/2023 0701   HCT 28.7 (L) 02/11/2023 0701   PLT 226 02/11/2023 0701   MCV 104.4 (H) 02/11/2023 0701   MCH 34.2 (H) 02/11/2023 0701   MCHC 32.8 02/11/2023 0701   RDW 12.0 02/11/2023 0701   LYMPHSABS 3.2 02/07/2023 2124   MONOABS 1.4 (H) 02/07/2023  2124   EOSABS 0.0 02/07/2023 2124   BASOSABS 0.0 02/07/2023 2124      Latest Ref Rng & Units 02/11/2023    7:01 AM 02/10/2023    5:00 AM 02/09/2023    4:37 AM  CMP  Glucose 70 - 99 mg/dL 84  66  96   BUN 8 - 23 mg/dL 17  21  33   Creatinine 0.44 - 1.00 mg/dL 2.95  6.21  3.08   Sodium 135 - 145 mmol/L 130  127  123   Potassium 3.5 - 5.1 mmol/L 4.3  3.3  4.2   Chloride 98 - 111 mmol/L 104  100  96   CO2 22 - 32 mmol/L 13  17  19    Calcium 8.9 - 10.3 mg/dL 8.1  7.8  8.2      Scheduled Meds:  aspirin EC  81  mg Oral Q breakfast   atorvastatin  40 mg Oral Daily   Chlorhexidine Gluconate Cloth  6 each Topical Daily   clopidogrel  75 mg Oral Daily   feeding supplement  237 mL Oral BID BM   methocarbamol  500 mg Oral TID   multivitamin with minerals  1 tablet Oral Daily   nicotine  21 mg Transdermal Daily   sodium bicarbonate  650 mg Oral BID   tamsulosin  0.4 mg Oral QPC supper   Continuous Infusions:  Time 27  Rhetta Mura, MD  [facility]

## 2023-02-11 NOTE — Plan of Care (Signed)
  Problem: Education: Goal: Knowledge of General Education information will improve Description Including pain rating scale, medication(s)/side effects and non-pharmacologic comfort measures Outcome: Progressing   

## 2023-02-12 ENCOUNTER — Inpatient Hospital Stay (HOSPITAL_COMMUNITY): Payer: Medicare Other

## 2023-02-12 DIAGNOSIS — I639 Cerebral infarction, unspecified: Secondary | ICD-10-CM | POA: Diagnosis not present

## 2023-02-12 LAB — CBC WITH DIFFERENTIAL/PLATELET
Abs Immature Granulocytes: 0.07 10*3/uL (ref 0.00–0.07)
Basophils Absolute: 0 10*3/uL (ref 0.0–0.1)
Basophils Relative: 0 %
Eosinophils Absolute: 0.3 10*3/uL (ref 0.0–0.5)
Eosinophils Relative: 2 %
HCT: 26.9 % — ABNORMAL LOW (ref 36.0–46.0)
Hemoglobin: 9.2 g/dL — ABNORMAL LOW (ref 12.0–15.0)
Immature Granulocytes: 1 %
Lymphocytes Relative: 18 %
Lymphs Abs: 2.2 10*3/uL (ref 0.7–4.0)
MCH: 34.3 pg — ABNORMAL HIGH (ref 26.0–34.0)
MCHC: 34.2 g/dL (ref 30.0–36.0)
MCV: 100.4 fL — ABNORMAL HIGH (ref 80.0–100.0)
Monocytes Absolute: 0.6 10*3/uL (ref 0.1–1.0)
Monocytes Relative: 5 %
Neutro Abs: 9 10*3/uL — ABNORMAL HIGH (ref 1.7–7.7)
Neutrophils Relative %: 74 %
Platelets: 252 10*3/uL (ref 150–400)
RBC: 2.68 MIL/uL — ABNORMAL LOW (ref 3.87–5.11)
RDW: 11.9 % (ref 11.5–15.5)
WBC: 12.2 10*3/uL — ABNORMAL HIGH (ref 4.0–10.5)
nRBC: 0 % (ref 0.0–0.2)

## 2023-02-12 LAB — COMPREHENSIVE METABOLIC PANEL
ALT: 20 U/L (ref 0–44)
AST: 49 U/L — ABNORMAL HIGH (ref 15–41)
Albumin: 2.8 g/dL — ABNORMAL LOW (ref 3.5–5.0)
Alkaline Phosphatase: 37 U/L — ABNORMAL LOW (ref 38–126)
Anion gap: 11 (ref 5–15)
BUN: 17 mg/dL (ref 8–23)
CO2: 17 mmol/L — ABNORMAL LOW (ref 22–32)
Calcium: 8.3 mg/dL — ABNORMAL LOW (ref 8.9–10.3)
Chloride: 101 mmol/L (ref 98–111)
Creatinine, Ser: 0.97 mg/dL (ref 0.44–1.00)
GFR, Estimated: 60 mL/min — ABNORMAL LOW (ref >60)
Glucose, Bld: 94 mg/dL (ref 70–99)
Potassium: 4 mmol/L (ref 3.5–5.1)
Sodium: 129 mmol/L — ABNORMAL LOW (ref 135–145)
Total Bilirubin: 1 mg/dL (ref 0.3–1.2)
Total Protein: 5 g/dL — ABNORMAL LOW (ref 6.5–8.1)

## 2023-02-12 LAB — SODIUM, URINE, RANDOM: Sodium, Ur: 77 mmol/L

## 2023-02-12 LAB — CREATININE, URINE, RANDOM: Creatinine, Urine: 62 mg/dL

## 2023-02-12 LAB — OSMOLALITY, URINE: Osmolality, Ur: 591 mOsm/kg (ref 300–900)

## 2023-02-12 MED ORDER — UREA 15 G PO PACK
15.0000 g | PACK | Freq: Two times a day (BID) | ORAL | Status: DC
  Administered 2023-02-12 – 2023-02-13 (×3): 15 g via ORAL
  Filled 2023-02-12 (×4): qty 1

## 2023-02-12 NOTE — Progress Notes (Signed)
Physical Therapy Treatment  Patient Details Name: [name] MRN: [id number] DOB: 01/09/1946 Today's Date: 02/12/2023   History of Present Illness Pt is a 77 y/o female who presents to [facility] 02/07/2023 via EMS due to a fall at home. Patient was unable to provide history, history was provided by daughter-in-law. Per report, patient fell off of her bed yesterday and was unable to get up off the ground, EMS was called and patient was helped back to bed. Shortly after this, patient was said to develop left sided weakness by daughter-in-law, but patient did not want to go to the ED. Patient was unable to get out of bed since she fell off the bed (about 36 hours) and was also noted to have a subtle left-sided facial droop. MRI revealed acute to early subacute posterior R MCA infarct. PMH significant for HTN, tobacco use, L THR 2016, hernia repair x2.    PT Comments  Pt progressing slowly towards physical therapy goals. Required heavy +2 assist for balance support and safety with standing attempts utilizing the [facility]. Pt looking around room and at objects as if she can see them, however reports she cannot see due to Glaucoma. Hand-over-hand assist provided but sensation and proprioception appeared to be impaired as well. Formal testing not performed as pt appeared to be confused. Pt reports she is unable to hold her own cup and asking for a drink of water. Therapist facilitated pt to hold her cup with the R hand and assisted in finding the straw. Of note, pt noted to have 1 bout of dizziness when rolling L and observed high amplitude vertical nystagmus. Will continue to follow and progress as able per POC.     If plan is discharge home, recommend the following: A lot of help with bathing/dressing/bathroom;Help with stairs or ramp for entrance;Assistance with cooking/housework;Two people to help with walking and/or transfers;Assist for transportation;Supervision due to cognitive status;Direct supervision/assist  for medications management;Assistance with feeding   Can travel by private vehicle     No  Equipment Recommendations  None recommended by PT    Recommendations for Other Services       Precautions / Restrictions Precautions Precautions: Fall;Other (comment) Precaution Comments: low vision Restrictions Weight Bearing Restrictions: No     Mobility  Bed Mobility Overal bed mobility: Needs Assistance Bed Mobility: Supine to Sit, Sit to Supine     Supine to sit: Max assist, +2 for safety/equipment, HOB elevated Sit to supine: Max assist, +2 for physical assistance   General bed mobility comments: HOB significantly elevated to ease transition to sitting as pt with dizziness and vertical nystagmus when rolling L. Heavy +2 assist required for return to supine at end of session.    Transfers Overall transfer level: Needs assistance Equipment used: Ambulation equipment used Transfers: Sit to/from Stand Sit to Stand: Max assist, +2 physical assistance, +2 safety/equipment, From elevated surface, Via lift equipment           General transfer comment: Antony Salmon utilized for transition sit<>stand x2. Pt states she cannot see the center bar of Stedy in front of her and requires therapist to place her hands on the bar. R hand guided to the bar and pt able to hold onto it well. Anticipated pt would be able to guide L hand near the R and find the bar but she was not able to attempt, and L hand placed on the center bar. Once there, pt continued to hold to therapist's fingers and required multimodal cues to  release therapist's fingers and grab the bar. Heavy +2 assist for power up to full stand. Pt with significant L lateral lean throughout standing attempts. Transfer via Lift Equipment: Stedy  Ambulation/Gait                   Stairs             Wheelchair Mobility     Tilt Bed    Modified Rankin (Stroke Patients Only)       Balance Overall balance assessment: Needs  assistance Sitting-balance support: Feet supported, No upper extremity supported Sitting balance-Leahy Scale: Poor   Postural control: Left lateral lean, Posterior lean Standing balance support: Bilateral upper extremity supported, Reliant on assistive device for balance Standing balance-Leahy Scale: Zero Standing balance comment: +2 for safety.                            Cognition Arousal: Alert Behavior During Therapy: WFL for tasks assessed/performed Overall Cognitive Status: Impaired/Different from baseline Area of Impairment: Attention, Memory, Following commands, Safety/judgement, Awareness, Problem solving                   Current Attention Level: Focused Memory: Decreased short-term memory Following Commands: Follows one step commands inconsistently, Follows one step commands with increased time Safety/Judgement: Decreased awareness of safety, Decreased awareness of deficits Awareness: Intellectual Problem Solving: Slow processing, Decreased initiation, Difficulty sequencing, Requires verbal cues, Requires tactile cues          Exercises      General Comments        Pertinent Vitals/Pain Pain Assessment Pain Assessment: Faces Faces Pain Scale: No hurt    Home Living                          Prior Function            PT Goals (current goals can now be found in the care plan section) Acute Rehab PT Goals Patient Stated Goal: return home after rehab PT Goal Formulation: With patient Time For Goal Achievement: 02/23/23 Potential to Achieve Goals: Fair Progress towards PT goals: Progressing toward goals    Frequency    Min 2X/week      PT Plan      Co-evaluation              AM-PAC PT "6 Clicks" Mobility   Outcome Measure  Help needed turning from your back to your side while in a flat bed without using bedrails?: A Lot Help needed moving from lying on your back to sitting on the side of a flat bed without  using bedrails?: A Lot Help needed moving to and from a bed to a chair (including a wheelchair)?: Total Help needed standing up from a chair using your arms (e.g., wheelchair or bedside chair)?: Total Help needed to walk in hospital room?: Total Help needed climbing 3-5 steps with a railing? : Total 6 Click Score: 8    End of Session Equipment Utilized During Treatment: Gait belt Activity Tolerance: Patient tolerated treatment well;Patient limited by fatigue Patient left: in bed;with call bell/phone within reach;with bed alarm set Nurse Communication: Mobility status PT Visit Diagnosis: Unsteadiness on feet (R26.81);Other abnormalities of gait and mobility (R26.89);Muscle weakness (generalized) (M62.81)     Time: 1914-7829 PT Time Calculation (min) (ACUTE ONLY): 23 min  Charges:    $Therapeutic Activity: 23-37 mins PT General Charges $$ ACUTE  PT VISIT: 1 Visit                     Conni Slipper, PT, DPT Acute Rehabilitation Services Secure Chat Preferred Office: [phone]    [name] 02/12/2023, 3:19 PM

## 2023-02-12 NOTE — Progress Notes (Signed)
HOSPITALIST ROUNDING NOTE SHIVANGI BODDICKER UEA:[id number]  DOB: February 26, 1946  DOA: 02/07/2023  PCP: Assunta Found, MD  02/12/2023,8:32 AM   LOS: 5 days      Code Status: Full code   From: Home  current Dispo: Likely pending nursing center     77 year old white female history of Crohn's diagnosed in 2006, prior CVA 2003 with severe LICA stenosis status post Dacron patch angioplasty Dr. Jaynie Collins, HTN, DDD Prior incarcerated partial small bowel obstruction status post incisional herniorrhaphy + mesh 2014 Dr. Lovell Sheehan Known continued tobacco  Apparently patient fell off of her bed 8/24 developed left-sided weakness and stayed at home for 36 hours and came in to [facility], ED has been unable to get out of bed CT head showed right posterior lobe decreased attenuation with subacute infarct CCM was consulted and patient was managed at [facility] Sodium was 115 potassium 4.5 BUN/creatinine 37/1.3 WBC 19 8/25 echo showed grade 1 diastolic dysfunction with EF 70-75% and carotid duplex showed blockages 8/27-CTA head neck abnormal showing severe focal stenosis M3-M4 segment and nearly occlusive stenosis in proximal right ICA and decision made to transfer patient to [facility]  Plan  Severe right ICA stenosis Poor current candidate for right carotid endarterectomy at this time but may be a better candidate moving forward if to space well with therapy etc. Continues on aspirin 81, Plavix 75 etc.  AKI on admission with severe hyponatremia--- hyponatremia lowest was 115--osmolality on admission 264 in keeping with hypovolemic hyponatremia (was hypotensive as well) She has a renal acidosis-bicarb 650 twice daily Urine osmolality is 591-serum osmolality is 269 She has now component of SIADH, fluid restricted 1500 cc, start urea tablets  Neck stiffness CT C-spine without acute findings-myofascial spasm-Taper methocarbamol and changed to as needed as do not want the patient  confused  Hypokalemia Now resolved-stop replacement of potassium  2 pack/day smoker No wheeze Unlikely to quit Low-dose CT scan as an outpatient  Leukocytosis etiology unclear White count is trended down on its own from 19-14 White count remains in the 12 range with unclear etiology which may need workup as an outpatient-see above regarding smoking history  Acute urinary retention Retaining over 300 cc urine 8/28 Foley catheter placed and will try to do a voiding trial on a.m. of 8/30   DVT prophylaxis: SCD  Status is: Inpatient Remains inpatient appropriate because:  Requires skilled facility prefers [facility]    Subjective:  Remembers me from yesterday "I feel cold" no cough No fever She is a little bit upset about being left on her body She reiterates she wants to go to the [facility] at discharge   Objective + exam Vitals:   02/11/23 1614 02/11/23 1953 02/11/23 2337 02/12/23 0356  BP: (!) 148/76 138/79 (!) 148/75 135/73  Pulse: (!) 101 (!) 105 99 (!) 106  Resp: 16 18 18 18   Temp: 98 F (36.7 C) 98.5 F (36.9 C) (!) 97.3 F (36.3 C) 97.6 F (36.4 C)  TempSrc: Oral Oral Oral Oral  SpO2: 99% 98% 97% 100%  Weight:      Height:       Filed Weights   02/07/23 2250 02/09/23 0441 02/10/23 0459  Weight: 53.1 kg 52.6 kg 54.6 kg    Examination: Awake coherent remembers me from prior No icterus no pallor Chest is clear anteriorly no wheeze S1-S2 slight tachycardia Abdomen soft no rebound Deficit left hand is diminished she is actually able to grip and open her fingers  better She still is unable to straight leg raise on the left Smile is symmetric   Data Reviewed: reviewed     CBC    Component Value Date/Time   WBC 12.2 (H) 02/12/2023 0249   RBC 2.68 (L) 02/12/2023 0249   HGB 9.2 (L) 02/12/2023 0249   HCT 26.9 (L) 02/12/2023 0249   PLT 252 02/12/2023 0249   MCV 100.4 (H) 02/12/2023 0249   MCH 34.3 (H) 02/12/2023 0249   MCHC 34.2 02/12/2023  0249   RDW 11.9 02/12/2023 0249   LYMPHSABS 2.2 02/12/2023 0249   MONOABS 0.6 02/12/2023 0249   EOSABS 0.3 02/12/2023 0249   BASOSABS 0.0 02/12/2023 0249      Latest Ref Rng & Units 02/12/2023    2:49 AM 02/11/2023    7:01 AM 02/10/2023    5:00 AM  CMP  Glucose 70 - 99 mg/dL 94  84  66   BUN 8 - 23 mg/dL 17  17  21    Creatinine 0.44 - 1.00 mg/dL 2.95  6.21  3.08   Sodium 135 - 145 mmol/L 129  130  127   Potassium 3.5 - 5.1 mmol/L 4.0  4.3  3.3   Chloride 98 - 111 mmol/L 101  104  100   CO2 22 - 32 mmol/L 17  13  17    Calcium 8.9 - 10.3 mg/dL 8.3  8.1  7.8   Total Protein 6.5 - 8.1 g/dL 5.0     Total Bilirubin 0.3 - 1.2 mg/dL 1.0     Alkaline Phos 38 - 126 U/L 37     AST 15 - 41 U/L 49     ALT 0 - 44 U/L 20        Scheduled Meds:  aspirin EC  81 mg Oral Q breakfast   atorvastatin  40 mg Oral Daily   Chlorhexidine Gluconate Cloth  6 each Topical Daily   clopidogrel  75 mg Oral Daily   feeding supplement  237 mL Oral BID BM   multivitamin with minerals  1 tablet Oral Daily   nicotine  21 mg Transdermal Daily   sodium bicarbonate  650 mg Oral BID   tamsulosin  0.4 mg Oral QPC supper   Continuous Infusions:  Time 37  Rhetta Mura, MD  [facility]

## 2023-02-12 NOTE — Evaluation (Signed)
Speech Language Pathology Evaluation Patient Details Name: [name] MRN: [id number] DOB: 11/04/1945 Today's Date: 02/12/2023 Time: 0906-0920 SLP Time Calculation (min) (ACUTE ONLY): 14 min  Problem List:  Patient Active Problem List   Diagnosis Date Noted   Hyponatremia 02/08/2023   AKI (acute kidney injury) (HCC) 02/08/2023   Leukocytosis 02/08/2023   Acute ischemic stroke (HCC) 02/07/2023   Postoperative anemia due to acute blood loss    Subcapital fracture of hip (HCC) 04/09/2015   Hip fracture (HCC) 04/09/2015   Left displaced femoral neck fracture (HCC) 04/09/2015   Gastric ulceration    Upper GI bleed    UTI (lower urinary tract infection) 02/21/2015   Trichomonal infection 02/21/2015   Small bowel obstruction (HCC) 05/14/2013   Ventral hernia 06/03/2011   HTN (hypertension) 06/03/2011   SBO (small bowel obstruction) (HCC) 05/31/2011   Crohn's disease (HCC) 05/31/2011   Tobacco abuse 05/31/2011   Benign essential HTN 05/31/2011   Past Medical History:  Past Medical History:  Diagnosis Date   Anemia, unspecified    Arthritis    Crohn's disease (HCC)    Degenerative disc disease, lumbar    Hypertension    Sciatica of right side    Stroke (HCC)    Ventral hernia    Past Surgical History:  Past Surgical History:  Procedure Laterality Date   CAROTID ENDARTERECTOMY     2004   CHOLECYSTECTOMY     2008   ESOPHAGOGASTRODUODENOSCOPY N/A 02/24/2015   Procedure: ESOPHAGOGASTRODUODENOSCOPY (EGD);  Surgeon: Corbin Ade, MD;  Location: [facility] END[facility] SU[facility];  Service: Endoscopy;  Laterality: N/A;   HEMICOLECTOMY     right   HIP ARTHROPLASTY Left 04/09/2015   Procedure: INSERT PARTIAL HIP REPLACEMENT LEFT;  Surgeon: Vickki Hearing, MD;  Location: [facility] ORS;  Service: Orthopedics;  Laterality: Left;   INCISIONAL HERNIA REPAIR N/A 05/20/2013   Procedure: Sherald Hess HERNIORRHAPHY WITH MESH;  Surgeon: Dalia Heading, MD;  Location: [facility] ORS;  Service: General;  Laterality: N/A;    INCISIONAL HERNIA REPAIR N/A 05/19/2014   Procedure: RECURRENT HERNIA REPAIR INCISIONAL;  Surgeon: Dalia Heading, MD;  Location: [facility] ORS;  Service: General;  Laterality: N/A;   INSERTION OF MESH N/A 05/20/2013   Procedure: INSERTION OF MESH;  Surgeon: Dalia Heading, MD;  Location: [facility] ORS;  Service: General;  Laterality: N/A;   INSERTION OF MESH N/A 05/19/2014   Procedure: INSERTION OF MESH;  Surgeon: Dalia Heading, MD;  Location: [facility] ORS;  Service: General;  Laterality: N/A;   TUBAL LIGATION     HPI:  [name] is a 77 y.o. female who presented on 02/07/2023 to [facility] after a fall the day before with left-sided weakness. MRI Brain 02/09/2023: Moderate-sized acute to early subacute posterior right MCA infarct. Moderate chronic small vessel ischemic disease and cerebral atrophy. PMH includes: HTN, left carotid endarterectomy, nicotine use disorder   Assessment / Plan / Recommendation Clinical Impression  Pt has reduced awareness of acute physical changes, mixing up her L and R and acknowledging weakness on one side, but not able to determine which side is the weaker one in practice. She is distractible and needs at least Min cues to follow one-step commands through functional tasks. She does not remember that she has transferred to [facility], but exhibits some carryover within this session. Pt's speech is also mildly dysarthric. She will benefit from ongoing SLP services acutely and at next level of care.    SLP Assessment  SLP Recommendation/Assessment: Patient needs  continued Speech Lanaguage Pathology Services SLP Visit Diagnosis: Cognitive communication deficit (R41.841);Dysarthria and anarthria (R47.1)    Recommendations for follow up therapy are one component of a multi-disciplinary discharge planning process, led by the attending physician.  Recommendations may be updated based on patient status, additional functional criteria and insurance authorization.    Follow Up Recommendations  Skilled  nursing-short term rehab (<3 hours/day)    Assistance Recommended at Discharge  Frequent or constant Supervision/Assistance  Functional Status Assessment Patient has had a recent decline in their functional status and demonstrates the ability to make significant improvements in function in a reasonable and predictable amount of time.  Frequency and Duration min 2x/week  2 weeks      SLP Evaluation Cognition  Overall Cognitive Status: Impaired/Different from baseline Arousal/Alertness: Awake/alert Orientation Level: Oriented to person;Disoriented to place;Oriented to time;Disoriented to situation Attention: Sustained Sustained Attention: Impaired Sustained Attention Impairment: Verbal basic Memory: Impaired Memory Impairment: Decreased recall of new information Awareness: Impaired Awareness Impairment: Emergent impairment;Intellectual impairment Problem Solving: Impaired Problem Solving Impairment: Functional basic Safety/Judgment: Impaired       Comprehension  Auditory Comprehension Overall Auditory Comprehension: Impaired Commands: Impaired One Step Basic Commands: 75-100% accurate Conversation: Simple Interfering Components: Attention    Expression Expression Primary Mode of Expression: Verbal Verbal Expression Overall Verbal Expression: Appears within functional limits for tasks assessed   Oral / Motor  Oral Motor/Sensory Function Overall Oral Motor/Sensory Function: Mild impairment Facial ROM: Reduced left;Suspected CN VII (facial) dysfunction Facial Symmetry: Abnormal symmetry left;Suspected CN VII (facial) dysfunction Facial Strength: Reduced left;Suspected CN VII (facial) dysfunction Lingual ROM: Within Functional Limits Lingual Symmetry: Within Functional Limits Lingual Strength: Within Functional Limits Velum: Within Functional Limits Mandible: Within Functional Limits Motor Speech Overall Motor Speech: Impaired Respiration: Within functional  limits Phonation: Normal Resonance: Within functional limits Articulation: Impaired Level of Impairment: Phrase Intelligibility: Intelligibility reduced Word: 75-100% accurate Phrase: 50-74% accurate             Mahala Menghini., M.A. CCC-SLP Acute Rehabilitation Services Office [phone]  Secure chat preferred  02/12/2023, 12:48 PM

## 2023-02-12 NOTE — Progress Notes (Signed)
Speech Language Pathology Treatment: Dysphagia  Patient Details Name: [name] MRN: [id number] DOB: 08/22/1945 Today's Date: 02/12/2023 Time: 8657-8469 SLP Time Calculation (min) (ACUTE ONLY): 10 min  Assessment / Plan / Recommendation Clinical Impression  Pt was seen during breakfast meal with no overt s/s of aspiration. Mastication is a little prolonged but may be near baseline considering lack of dentition. She primarily cleared her oral cavity except for one instance in which a few tiny pieces of oatmeal stayed on her tongue. Pt was very aware but needed a little assistance to clear it. No overt s/s of aspiration are observed, but note that CXR is pending. Will leave on Dys 2 diet and thin liquids for now with at least brief SLP f/u.   HPI HPI: [name] is a 77 y.o. female who presented on 02/07/2023 to [facility] after a fall the day before with left-sided weakness. MRI Brain 02/09/2023: Moderate-sized acute to early subacute posterior right MCA infarct. Moderate chronic small vessel ischemic disease and cerebral atrophy. PMH includes: HTN, left carotid endarterectomy, nicotine use disorder      SLP Plan  Continue with current plan of care      Recommendations for follow up therapy are one component of a multi-disciplinary discharge planning process, led by the attending physician.  Recommendations may be updated based on patient status, additional functional criteria and insurance authorization.    Recommendations  Diet recommendations: Dysphagia 2 (fine chop);Thin liquid Liquids provided via: Cup;Straw Medication Administration: Whole meds with liquid Supervision: Staff to assist with self feeding Compensations: Slow rate Postural Changes and/or Swallow Maneuvers: Seated upright 90 degrees                  Oral care BID;Staff/trained caregiver to provide oral care   Frequent or constant Supervision/Assistance Dysphagia, unspecified (R13.10)     Continue with current  plan of care     Mahala Menghini., M.A. CCC-SLP Acute Rehabilitation Services Office [phone]  Secure chat preferred   02/12/2023, 9:40 AM

## 2023-02-12 NOTE — Progress Notes (Signed)
MEWS Progress Note  Patient Details Name: CAMORA FAVAZZA MRN: [id number] DOB: 1946/01/15 Today's Date: 02/12/2023   MEWS Flowsheet Documentation:  Assess: MEWS Score Temp: 98.2 F (36.8 C) BP: 113/74 MAP (mmHg): 82 Pulse Rate: (!) 111 ECG Heart Rate: (!) 109 Resp: 18 Level of Consciousness: Alert SpO2: 98 % O2 Device: Room Air Patient Activity (if Appropriate): In bed Assess: MEWS Score MEWS Temp: 0 MEWS Systolic: 0 MEWS Pulse: 2 MEWS RR: 0 MEWS LOC: 0 MEWS Score: 2 MEWS Score Color: Yellow Assess: SIRS CRITERIA SIRS Temperature : 0 SIRS Respirations : 0 SIRS Pulse: 1 SIRS WBC: 0 SIRS Score Sum : 1 Assess: if the MEWS score is Yellow or Red Were vital signs accurate and taken at a resting state?: Yes Does the patient meet 2 or more of the SIRS criteria?: No MEWS guidelines implemented : Yes, yellow Treat MEWS Interventions: Considered administering scheduled or prn medications/treatments as ordered Take Vital Signs Increase Vital Sign Frequency : Yellow: Q2hr x1, continue Q4hrs until patient remains green for 12hrs Escalate MEWS: Escalate: Yellow: Discuss with charge nurse and consider notifying provider and/or RRT Notify: Charge Nurse/RN Name of Charge Nurse/RN Notified: [place], RN      Jazen Spraggins Shary Key 02/12/2023, 8:17 PM

## 2023-02-13 DIAGNOSIS — R531 Weakness: Secondary | ICD-10-CM | POA: Diagnosis not present

## 2023-02-13 DIAGNOSIS — I2489 Other forms of acute ischemic heart disease: Secondary | ICD-10-CM | POA: Diagnosis present

## 2023-02-13 DIAGNOSIS — D649 Anemia, unspecified: Secondary | ICD-10-CM | POA: Diagnosis not present

## 2023-02-13 DIAGNOSIS — Z681 Body mass index (BMI) 19 or less, adult: Secondary | ICD-10-CM | POA: Diagnosis not present

## 2023-02-13 DIAGNOSIS — K922 Gastrointestinal hemorrhage, unspecified: Secondary | ICD-10-CM | POA: Diagnosis not present

## 2023-02-13 DIAGNOSIS — R6889 Other general symptoms and signs: Secondary | ICD-10-CM | POA: Diagnosis not present

## 2023-02-13 DIAGNOSIS — D72829 Elevated white blood cell count, unspecified: Secondary | ICD-10-CM | POA: Diagnosis not present

## 2023-02-13 DIAGNOSIS — R278 Other lack of coordination: Secondary | ICD-10-CM | POA: Diagnosis not present

## 2023-02-13 DIAGNOSIS — K8689 Other specified diseases of pancreas: Secondary | ICD-10-CM | POA: Diagnosis not present

## 2023-02-13 DIAGNOSIS — R112 Nausea with vomiting, unspecified: Secondary | ICD-10-CM | POA: Diagnosis not present

## 2023-02-13 DIAGNOSIS — Z515 Encounter for palliative care: Secondary | ICD-10-CM | POA: Diagnosis not present

## 2023-02-13 DIAGNOSIS — R1312 Dysphagia, oropharyngeal phase: Secondary | ICD-10-CM | POA: Diagnosis not present

## 2023-02-13 DIAGNOSIS — I6521 Occlusion and stenosis of right carotid artery: Secondary | ICD-10-CM | POA: Diagnosis present

## 2023-02-13 DIAGNOSIS — N3001 Acute cystitis with hematuria: Secondary | ICD-10-CM | POA: Diagnosis not present

## 2023-02-13 DIAGNOSIS — N189 Chronic kidney disease, unspecified: Secondary | ICD-10-CM | POA: Diagnosis not present

## 2023-02-13 DIAGNOSIS — I499 Cardiac arrhythmia, unspecified: Secondary | ICD-10-CM | POA: Diagnosis not present

## 2023-02-13 DIAGNOSIS — R6520 Severe sepsis without septic shock: Secondary | ICD-10-CM | POA: Diagnosis not present

## 2023-02-13 DIAGNOSIS — K5651 Intestinal adhesions [bands], with partial obstruction: Secondary | ICD-10-CM | POA: Diagnosis not present

## 2023-02-13 DIAGNOSIS — Z741 Need for assistance with personal care: Secondary | ICD-10-CM | POA: Diagnosis not present

## 2023-02-13 DIAGNOSIS — R293 Abnormal posture: Secondary | ICD-10-CM | POA: Diagnosis not present

## 2023-02-13 DIAGNOSIS — N178 Other acute kidney failure: Secondary | ICD-10-CM | POA: Diagnosis not present

## 2023-02-13 DIAGNOSIS — I639 Cerebral infarction, unspecified: Secondary | ICD-10-CM | POA: Diagnosis not present

## 2023-02-13 DIAGNOSIS — F17210 Nicotine dependence, cigarettes, uncomplicated: Secondary | ICD-10-CM | POA: Diagnosis not present

## 2023-02-13 DIAGNOSIS — N179 Acute kidney failure, unspecified: Secondary | ICD-10-CM | POA: Diagnosis not present

## 2023-02-13 DIAGNOSIS — K861 Other chronic pancreatitis: Secondary | ICD-10-CM | POA: Diagnosis not present

## 2023-02-13 DIAGNOSIS — I7100 Dissection of unspecified site of aorta: Secondary | ICD-10-CM | POA: Diagnosis not present

## 2023-02-13 DIAGNOSIS — B49 Unspecified mycosis: Secondary | ICD-10-CM | POA: Diagnosis not present

## 2023-02-13 DIAGNOSIS — Z4682 Encounter for fitting and adjustment of non-vascular catheter: Secondary | ICD-10-CM | POA: Diagnosis not present

## 2023-02-13 DIAGNOSIS — R109 Unspecified abdominal pain: Secondary | ICD-10-CM | POA: Diagnosis not present

## 2023-02-13 DIAGNOSIS — R519 Headache, unspecified: Secondary | ICD-10-CM | POA: Diagnosis not present

## 2023-02-13 DIAGNOSIS — B377 Candidal sepsis: Secondary | ICD-10-CM | POA: Diagnosis not present

## 2023-02-13 DIAGNOSIS — Z1152 Encounter for screening for COVID-19: Secondary | ICD-10-CM | POA: Diagnosis not present

## 2023-02-13 DIAGNOSIS — K50911 Crohn's disease, unspecified, with rectal bleeding: Secondary | ICD-10-CM | POA: Diagnosis not present

## 2023-02-13 DIAGNOSIS — R9431 Abnormal electrocardiogram [ECG] [EKG]: Secondary | ICD-10-CM | POA: Diagnosis not present

## 2023-02-13 DIAGNOSIS — R262 Difficulty in walking, not elsewhere classified: Secondary | ICD-10-CM | POA: Diagnosis not present

## 2023-02-13 DIAGNOSIS — Z7401 Bed confinement status: Secondary | ICD-10-CM | POA: Diagnosis not present

## 2023-02-13 DIAGNOSIS — N390 Urinary tract infection, site not specified: Secondary | ICD-10-CM | POA: Diagnosis not present

## 2023-02-13 DIAGNOSIS — I10 Essential (primary) hypertension: Secondary | ICD-10-CM | POA: Diagnosis not present

## 2023-02-13 DIAGNOSIS — R1111 Vomiting without nausea: Secondary | ICD-10-CM | POA: Diagnosis not present

## 2023-02-13 DIAGNOSIS — Z743 Need for continuous supervision: Secondary | ICD-10-CM | POA: Diagnosis not present

## 2023-02-13 DIAGNOSIS — K5000 Crohn's disease of small intestine without complications: Secondary | ICD-10-CM | POA: Diagnosis not present

## 2023-02-13 DIAGNOSIS — I69354 Hemiplegia and hemiparesis following cerebral infarction affecting left non-dominant side: Secondary | ICD-10-CM | POA: Diagnosis not present

## 2023-02-13 DIAGNOSIS — I63511 Cerebral infarction due to unspecified occlusion or stenosis of right middle cerebral artery: Secondary | ICD-10-CM | POA: Diagnosis not present

## 2023-02-13 DIAGNOSIS — R339 Retention of urine, unspecified: Secondary | ICD-10-CM | POA: Diagnosis not present

## 2023-02-13 DIAGNOSIS — K56609 Unspecified intestinal obstruction, unspecified as to partial versus complete obstruction: Secondary | ICD-10-CM | POA: Diagnosis not present

## 2023-02-13 DIAGNOSIS — Z66 Do not resuscitate: Secondary | ICD-10-CM | POA: Diagnosis not present

## 2023-02-13 DIAGNOSIS — E876 Hypokalemia: Secondary | ICD-10-CM | POA: Diagnosis not present

## 2023-02-13 DIAGNOSIS — E860 Dehydration: Secondary | ICD-10-CM | POA: Diagnosis not present

## 2023-02-13 DIAGNOSIS — G894 Chronic pain syndrome: Secondary | ICD-10-CM | POA: Diagnosis not present

## 2023-02-13 DIAGNOSIS — A419 Sepsis, unspecified organism: Secondary | ICD-10-CM | POA: Diagnosis not present

## 2023-02-13 DIAGNOSIS — M6281 Muscle weakness (generalized): Secondary | ICD-10-CM | POA: Diagnosis not present

## 2023-02-13 DIAGNOSIS — I6782 Cerebral ischemia: Secondary | ICD-10-CM | POA: Diagnosis not present

## 2023-02-13 DIAGNOSIS — Z7189 Other specified counseling: Secondary | ICD-10-CM | POA: Diagnosis not present

## 2023-02-13 DIAGNOSIS — K5750 Diverticulosis of both small and large intestine without perforation or abscess without bleeding: Secondary | ICD-10-CM | POA: Diagnosis not present

## 2023-02-13 DIAGNOSIS — R000 Tachycardia, unspecified: Secondary | ICD-10-CM | POA: Diagnosis not present

## 2023-02-13 DIAGNOSIS — I679 Cerebrovascular disease, unspecified: Secondary | ICD-10-CM | POA: Diagnosis not present

## 2023-02-13 DIAGNOSIS — A4151 Sepsis due to Escherichia coli [E. coli]: Secondary | ICD-10-CM | POA: Diagnosis present

## 2023-02-13 DIAGNOSIS — E871 Hypo-osmolality and hyponatremia: Secondary | ICD-10-CM | POA: Diagnosis not present

## 2023-02-13 DIAGNOSIS — Z8249 Family history of ischemic heart disease and other diseases of the circulatory system: Secondary | ICD-10-CM | POA: Diagnosis not present

## 2023-02-13 DIAGNOSIS — I129 Hypertensive chronic kidney disease with stage 1 through stage 4 chronic kidney disease, or unspecified chronic kidney disease: Secondary | ICD-10-CM | POA: Diagnosis not present

## 2023-02-13 LAB — BASIC METABOLIC PANEL
Anion gap: 14 (ref 5–15)
BUN: 31 mg/dL — ABNORMAL HIGH (ref 8–23)
CO2: 21 mmol/L — ABNORMAL LOW (ref 22–32)
Calcium: 8.5 mg/dL — ABNORMAL LOW (ref 8.9–10.3)
Chloride: 96 mmol/L — ABNORMAL LOW (ref 98–111)
Creatinine, Ser: 0.94 mg/dL (ref 0.44–1.00)
GFR, Estimated: >60 mL/min (ref >60)
Glucose, Bld: 110 mg/dL — ABNORMAL HIGH (ref 70–99)
Potassium: 3.7 mmol/L (ref 3.5–5.1)
Sodium: 131 mmol/L — ABNORMAL LOW (ref 135–145)

## 2023-02-13 MED ORDER — HYDROCODONE-ACETAMINOPHEN 5-325 MG PO TABS: 1.0000 | ORAL_TABLET | Freq: Four times a day (QID) | ORAL | 0 refills | Status: DC | PRN

## 2023-02-13 MED ORDER — CLOPIDOGREL BISULFATE 75 MG PO TABS: 75.0000 mg | ORAL_TABLET | Freq: Every day | ORAL | Status: DC

## 2023-02-13 MED ORDER — NICOTINE 21 MG/24HR TD PT24: 21.0000 mg | MEDICATED_PATCH | Freq: Every day | TRANSDERMAL | Status: DC

## 2023-02-13 MED ORDER — METHOCARBAMOL 500 MG PO TABS: 500.0000 mg | ORAL_TABLET | Freq: Three times a day (TID) | ORAL | 0 refills | Status: DC | PRN

## 2023-02-13 MED ORDER — SODIUM BICARBONATE 650 MG PO TABS: 650.0000 mg | ORAL_TABLET | Freq: Two times a day (BID) | ORAL | Status: DC

## 2023-02-13 MED ORDER — WITCH HAZEL-GLYCERIN EX PADS
MEDICATED_PAD | CUTANEOUS | Status: DC | PRN
  Filled 2023-02-13: qty 100

## 2023-02-13 MED ORDER — METOPROLOL SUCCINATE ER 25 MG PO TB24: 12.5000 mg | ORAL_TABLET | Freq: Every day | ORAL | Status: DC

## 2023-02-13 MED ORDER — UREA 15 G PO PACK: 15.0000 g | PACK | Freq: Two times a day (BID) | ORAL | Status: DC

## 2023-02-13 MED ORDER — WITCH HAZEL-GLYCERIN EX PADS: MEDICATED_PAD | CUTANEOUS | 12 refills | Status: DC | PRN

## 2023-02-13 NOTE — Progress Notes (Signed)
Called report to [place] at [facility].  Erick Blinks, RN

## 2023-02-13 NOTE — Progress Notes (Signed)
Telephone call to Ms. [name] to inform that patient has left the hospital.

## 2023-02-13 NOTE — Progress Notes (Addendum)
  VASCULAR SURGERY ASSESSMENT & PLAN:   SYMPTOMATIC RIGHT CAROTID STENOSIS: The patient has shown some slight improvement in her left upper extremity weakness.  I spoke with Dr. Mahala Menghini..  The patient is going to go to a skilled nursing facility for therapy.  Therefore, I will arrange for follow-up in the office in 4 to 6 weeks to reconsider right carotid endarterectomy.  If she makes significant improvement during therapy I think we could consider right carotid endarterectomy.  Currently however she is not a candidate. She is on maximal medical therapy (aspirin, Plavix, and a statin).  Vascular surgery will follow peripherally.  SUBJECTIVE:   Complains about her hemorrhoids this morning.  PHYSICAL EXAM:   Vitals:   02/12/23 2204 02/12/23 2356 02/13/23 0345 02/13/23 0808  BP: 103/66 100/62 129/72 135/68  Pulse: (!) 104 100 (!) 109 (!) 105  Resp: 18 18 16 19   Temp: 97.6 F (36.4 C) 97.9 F (36.6 C) 98.5 F (36.9 C) 98.7 F (37.1 C)  TempSrc: Oral Oral Oral Oral  SpO2: 97% 98% 97% 98%  Weight:      Height:       Some slight improvement in her left upper extremity weakness.  LABS:   Lab Results  Component Value Date   WBC 12.2 (H) 02/12/2023   HGB 9.2 (L) 02/12/2023   HCT 26.9 (L) 02/12/2023   MCV 100.4 (H) 02/12/2023   PLT 252 02/12/2023   Lab Results  Component Value Date   CREATININE 0.97 02/12/2023   Lab Results  Component Value Date   INR 1.0 02/07/2023   PROBLEM LIST:    Principal Problem:   Acute ischemic stroke [facility]) Active Problems:   Benign essential HTN   Hyponatremia   AKI (acute kidney injury) (HCC)   Leukocytosis   CURRENT MEDS:    aspirin EC  81 mg Oral Q breakfast   atorvastatin  40 mg Oral Daily   Chlorhexidine Gluconate Cloth  6 each Topical Daily   clopidogrel  75 mg Oral Daily   feeding supplement  237 mL Oral BID BM   multivitamin with minerals  1 tablet Oral Daily   nicotine  21 mg Transdermal Daily   sodium bicarbonate  650 mg  Oral BID   tamsulosin  0.4 mg Oral QPC supper   urea  15 g Oral BID    Waverly Ferrari Office: [phone] 02/13/2023

## 2023-02-13 NOTE — Discharge Summary (Signed)
Physician Discharge Summary  [name] WUJ:[id number] DOB: 14-May-1946 DOA: 02/07/2023  PCP: Assunta Found, MD  Admit date: 02/07/2023 Discharge date: 02/13/2023  Time spent: 46 minutes  Recommendations for Outpatient Follow-up:  Requires close monitoring of electrolytes with a Chem-7 on 9/2 as he is on both bicarb tablets as well as urea tablets and had electrolyte abnormalities that need close monitoring Would ideally allow 1800 cc of fluid daily but ensure that the patient eats and drinks enough additionally and has solute (example Gatorade) with every other cup Of fluid Will require close follow-up with Dr. Durwin Nora of vascular surgery who will peripherally follow the patient and should be seen by them in 4 to 6 weeks to consider right carotid endarterectomy Consider outpatient low-dose CT scan for lung issues/rule out cancer Please consider outpatient workup for low-grade leukocytosis as no cause was found this hospital stay  Discharge Diagnoses:  MAIN problem for hospitalization   Right ICA stenosis causing subacute infarct on the left side Leukocytosis NOS Severe hyponatremia on admission without metabolic and neurologic derangement Metabolic acidosis Hyponatremia on admission and SIADH at discharge--- had both disorders during hospitalization Frailty in adult female   Please see below for itemized issues addressed in HOpsital- refer to other progress notes for clarity if needed  Discharge Condition: Improved-fair  Diet recommendation: Do not restrict sodium-allow 1800 cc of fluid only for now  [facility] Weights   02/07/23 2250 02/09/23 0441 02/10/23 0459  Weight: 53.1 kg 52.6 kg 54.6 kg    History of present illness:  77 year old white female history of Crohn's diagnosed in 2006, prior CVA 2003 with severe LICA stenosis status post Dacron patch angioplasty Dr. Jaynie Collins, HTN, DDD Prior incarcerated partial small bowel obstruction status post incisional herniorrhaphy +  mesh 2014 Dr. Lovell Sheehan Known continued tobacco   Apparently patient fell off of her bed 8/24 developed left-sided weakness and stayed at home for 36 hours and came in to [facility], ED has been unable to get out of bed CT head showed right posterior lobe decreased attenuation with subacute infarct CCM was consulted and patient was managed at [facility] Sodium was 115 potassium 4.5 BUN/creatinine 37/1.3 WBC 19 8/25 echo showed grade 1 diastolic dysfunction with EF 70-75% and carotid duplex showed blockages 8/27-CTA head neck abnormal showing severe focal stenosis M3-M4 segment and nearly occlusive stenosis in proximal right ICA and decision made to transfer patient to [facility] Seen by vascular surgery and felt to not be a current candidate for endarterectomy given risk-benefit analysis but if she improves with some therapy (she is moving her left upper extremity little better) then may be in several weeks can undergo surgery  Hospital Course:  Severe right ICA stenosis Poor current candidate for right carotid endarterectomy at this time but may be a better candidate moving forward if to space well with therapy etc. Continues on aspirin 81, Plavix 75 etc. and would continue these medications until seen again by Dr. Durwin Nora   AKI on admission with severe hyponatremia--- hyponatremia lowest was 115--osmolality on admission 264 in keeping with hypovolemic hyponatremia (was hypotensive as well) She has a renal acidosis-bicarb 650 twice daily Repeat urine osmolality is 591-serum osmolality is 269 She has now component of SIADH, fluid restricted 1500 cc, start urea tablets and sodium has improved--- we will liberalize fluids to 1800 cc/day, continue urea tablets 15 twice daily and watch her sodium closely in the outpatient setting as she is also on bicarb this will need to  be monitored  Neck stiffness CT C-spine without acute findings-myofascial spasm-Taper methocarbamol and changed to as needed  as do not want the patient confused-please de-escalate in the outpatient setting   Hypokalemia Now resolved-stop replacement of potassium   2 pack/day smoker No wheeze Unlikely to quit Low-dose CT scan as an outpatient as per PCP   Leukocytosis etiology unclear White count is trended down on its own from 19-14 White count remains in the 12 range with unclear etiology which may need workup as an outpatient-see above regarding smoking history   Acute urinary retention Retaining over 300 cc urine 8/28 Passed voiding trials open should be able to discharge without catheter    Discharge Exam: Vitals:   02/13/23 0808 02/13/23 1110  BP: 135/68 119/62  Pulse: (!) 105 98  Resp: 19 20  Temp: 98.7 F (37.1 C) 98.6 F (37 C)  SpO2: 98% 99%    Subj on day of d/c   Interactive pleasant Has not really eaten   General Exam on discharge  EOMI has some left gaze deficit Can raise left arm with a flicker of power may be grade 3/5 dense deficit left lower extremity without straight leg raise Right arm seems a little swollen--This is baseline Abdomen soft no rebound no guarding S1-S2 no murmur RRR   Discharge Instructions   Discharge Instructions     Ambulatory referral to Neurology   Complete by: As directed    An appointment is requested in approximately: 8-12 weeks   Diet - low sodium heart healthy   Complete by: As directed    Increase activity slowly   Complete by: As directed       Allergies as of 02/13/2023       Reactions   Flagyl [metronidazole Hcl] Other (See Comments)   "makes me feel whoozy and weird"   Other    Unknown antibiotic and unknown pain medications        Medication List     STOP taking these medications    diphenhydrAMINE 25 mg capsule Commonly known as: BENADRYL   diphenhydrAMINE 25 MG tablet Commonly known as: SOMINEX   folic acid 1 MG tablet Commonly known as: FOLVITE   lisinopril 10 MG tablet Commonly known as: ZESTRIL    ondansetron 4 MG disintegrating tablet Commonly known as: ZOFRAN-ODT   traMADol-acetaminophen 37.5-325 MG tablet Commonly known as: ULTRACET       TAKE these medications    acetaminophen 500 MG tablet Commonly known as: TYLENOL Take 1 tablet (500 mg total) by mouth every 6 (six) hours as needed for mild pain.   aspirin EC 81 MG tablet Take 1 tablet (81 mg total) by mouth daily. Resume in 1 week What changed: additional instructions   clopidogrel 75 MG tablet Commonly known as: PLAVIX Take 1 tablet (75 mg total) by mouth daily. Start taking on: February 14, 2023   HYDROcodone-acetaminophen 5-325 MG tablet Commonly known as: NORCO/VICODIN Take 1 tablet by mouth every 6 (six) hours as needed for moderate pain.   methocarbamol 500 MG tablet Commonly known as: ROBAXIN Take 1 tablet (500 mg total) by mouth every 8 (eight) hours as needed for muscle spasms.   metoprolol succinate 25 MG 24 hr tablet Commonly known as: Toprol XL Take 0.5 tablets (12.5 mg total) by mouth daily.   nicotine 21 mg/24hr patch Commonly known as: NICODERM CQ - dosed in mg/24 hours Place 1 patch (21 mg total) onto the skin daily. Start taking on: February 14, 2023  sodium bicarbonate 650 MG tablet Take 1 tablet (650 mg total) by mouth 2 (two) times daily.   sulfaSALAzine 500 MG tablet Commonly known as: AZULFIDINE TAKE (2) TABLETS BY MOUTH TWICE DAILY.   urea 15 g Pack oral packet Commonly known as: URE-NA Take 15 g by mouth 2 (two) times daily.   witch hazel-glycerin pad Commonly known as: TUCKS Apply topically as needed for itching.       Allergies  Allergen Reactions   Flagyl [Metronidazole Hcl] Other (See Comments)    "makes me feel whoozy and weird"   Other     Unknown antibiotic and unknown pain medications     Contact information for after-discharge care     Destination     [facility] .   Service: Skilled Nursing Contact  information: [address] [place] [address] [phone]                      The results of significant diagnostics from this hospitalization (including imaging, microbiology, ancillary and laboratory) are listed below for reference.    Significant Diagnostic Studies: DG Chest 2 View  Result Date: 02/12/2023 CLINICAL DATA:  Pneumonia EXAM: CHEST - 2 VIEW COMPARISON:  01/24/2023 FINDINGS: Heart and mediastinal contours are within normal limits. No focal opacities or effusions. No acute bony abnormality. IMPRESSION: No active cardiopulmonary disease. Electronically Signed   By: Charlett Nose M.D.   On: 02/12/2023 10:21   CT ANGIO HEAD NECK W WO CM  Result Date: 02/09/2023 CLINICAL DATA:  Neuro deficit, acute, stroke suspected; Neck pain, acute, no red flags EXAM: CT CERVICAL SPINE WITHOUT CONTRAST CT ANGIOGRAPHY HEAD AND NECK WITH AND WITHOUT CONTRAST TECHNIQUE: Multidetector CT imaging of the cervical spine was performed without intravenous contrast. Multiplanar CT image reconstructions were also generated. Multidetector CT imaging of the head and neck was performed using the standard protocol during bolus administration of intravenous contrast. Multiplanar CT image reconstructions and MIPs were obtained to evaluate the vascular anatomy. Carotid stenosis measurements (when applicable) are obtained utilizing NASCET criteria, using the distal internal carotid diameter as the denominator. RADIATION DOSE REDUCTION: This exam was performed according to the departmental dose-optimization program which includes automated exposure control, adjustment of the mA and/or kV according to patient size and/or use of iterative reconstruction technique. CONTRAST:  75mL OMNIPAQUE IOHEXOL 350 MG/ML SOLN COMPARISON:  None Available. FINDINGS: CT HEAD FINDINGS See same day brain MRI for intracranial findings. No contrast-enhancing lesion is visualized. There is decreased arborization in the  distal right MCA territory. CT CERVICAL SPINE FINDINGS: Alignment: Straightening of the normal cervical lordosis. Trace retrolisthesis of C5 on C6. Trace anterolisthesis of C7 on T1. Skull base and vertebrae: No acute fracture. No primary bone lesion or focal pathologic process. Soft tissues and spinal canal: No prevertebral fluid or swelling. No visible canal hematoma. Disc levels:  No evidence of high-grade spinal canal stenosis Upper chest: Mild centrilobular emphysema Other: None. CTA NECK FINDINGS Aortic arch: Standard branching. Imaged portion shows no evidence of aneurysm or dissection. No significant stenosis of the major arch vessel origins. Mild narrowing of the origin of the right subclavian artery. Mild narrowing of the mid segment of the left subclavian artery. Right carotid system: There is severe, nearly occlusive stenosis in the proximal right ICA. The lumen of the level of stenosis is difficult to visualize and likely measures less than 1 mm of the site of greatest stenosis (series 19, image 33).  Left carotid system: There is likely a carotid web in the proximal left ICA (series 18, image 31). Likely postsurgical changes from a prior left-sided carotid endarterectomy. Vertebral arteries: Left dominant. The right vertebral artery terminates in a PICA. No evidence of dissection, stenosis (50% or greater), or occlusion. Skeleton: See above Other neck: Negative. Upper chest: See above Review of the MIP images confirms the above findings CTA HEAD FINDINGS Anterior circulation: There is severe focal stenosis in an M3/M4 segment of the right MCA (series 507, image 102). No proximal occlusion. There is a small 2 mm inferiorly projecting outpouching of the ICA terminus on the left (series 508, image 120), which could represent a small aneurysm. Posterior circulation: No significant stenosis, proximal occlusion, aneurysm, or vascular malformation. Venous sinuses: As permitted by contrast timing, patent.  Anatomic variants: Fetal PCA on the right. Review of the MIP images confirms the above findings IMPRESSION: 1. Severe, nearly occlusive stenosis in the proximal right ICA. The lumen of the level of stenosis is difficult to visualize and likely measures less than 1 mm of the site of greatest stenosis. 2. Severe focal stenosis in an M3/M4 segment of the right MCA. 3. No acute fracture or traumatic listhesis in the cervical spine. 4. Small 2 mm inferiorly projecting outpouching of the ICA terminus on the left, which could represent a small aneurysm. 5. See same day brain MRI for intracranial findings. 6. Emphysema. Emphysema (ICD10-J43.9). Electronically Signed   By: Lorenza Cambridge M.D.   On: 02/09/2023 18:34   CT C-SPINE NO CHARGE  Result Date: 02/09/2023 CLINICAL DATA:  Neuro deficit, acute, stroke suspected; Neck pain, acute, no red flags EXAM: CT CERVICAL SPINE WITHOUT CONTRAST CT ANGIOGRAPHY HEAD AND NECK WITH AND WITHOUT CONTRAST TECHNIQUE: Multidetector CT imaging of the cervical spine was performed without intravenous contrast. Multiplanar CT image reconstructions were also generated. Multidetector CT imaging of the head and neck was performed using the standard protocol during bolus administration of intravenous contrast. Multiplanar CT image reconstructions and MIPs were obtained to evaluate the vascular anatomy. Carotid stenosis measurements (when applicable) are obtained utilizing NASCET criteria, using the distal internal carotid diameter as the denominator. RADIATION DOSE REDUCTION: This exam was performed according to the departmental dose-optimization program which includes automated exposure control, adjustment of the mA and/or kV according to patient size and/or use of iterative reconstruction technique. CONTRAST:  75mL OMNIPAQUE IOHEXOL 350 MG/ML SOLN COMPARISON:  None Available. FINDINGS: CT HEAD FINDINGS See same day brain MRI for intracranial findings. No contrast-enhancing lesion is  visualized. There is decreased arborization in the distal right MCA territory. CT CERVICAL SPINE FINDINGS: Alignment: Straightening of the normal cervical lordosis. Trace retrolisthesis of C5 on C6. Trace anterolisthesis of C7 on T1. Skull base and vertebrae: No acute fracture. No primary bone lesion or focal pathologic process. Soft tissues and spinal canal: No prevertebral fluid or swelling. No visible canal hematoma. Disc levels:  No evidence of high-grade spinal canal stenosis Upper chest: Mild centrilobular emphysema Other: None. CTA NECK FINDINGS Aortic arch: Standard branching. Imaged portion shows no evidence of aneurysm or dissection. No significant stenosis of the major arch vessel origins. Mild narrowing of the origin of the right subclavian artery. Mild narrowing of the mid segment of the left subclavian artery. Right carotid system: There is severe, nearly occlusive stenosis in the proximal right ICA. The lumen of the level of stenosis is difficult to visualize and likely measures less than 1 mm of the site of greatest stenosis (series 19,  image 33). Left carotid system: There is likely a carotid web in the proximal left ICA (series 18, image 31). Likely postsurgical changes from a prior left-sided carotid endarterectomy. Vertebral arteries: Left dominant. The right vertebral artery terminates in a PICA. No evidence of dissection, stenosis (50% or greater), or occlusion. Skeleton: See above Other neck: Negative. Upper chest: See above Review of the MIP images confirms the above findings CTA HEAD FINDINGS Anterior circulation: There is severe focal stenosis in an M3/M4 segment of the right MCA (series 507, image 102). No proximal occlusion. There is a small 2 mm inferiorly projecting outpouching of the ICA terminus on the left (series 508, image 120), which could represent a small aneurysm. Posterior circulation: No significant stenosis, proximal occlusion, aneurysm, or vascular malformation. Venous  sinuses: As permitted by contrast timing, patent. Anatomic variants: Fetal PCA on the right. Review of the MIP images confirms the above findings IMPRESSION: 1. Severe, nearly occlusive stenosis in the proximal right ICA. The lumen of the level of stenosis is difficult to visualize and likely measures less than 1 mm of the site of greatest stenosis. 2. Severe focal stenosis in an M3/M4 segment of the right MCA. 3. No acute fracture or traumatic listhesis in the cervical spine. 4. Small 2 mm inferiorly projecting outpouching of the ICA terminus on the left, which could represent a small aneurysm. 5. See same day brain MRI for intracranial findings. 6. Emphysema. Emphysema (ICD10-J43.9). Electronically Signed   By: Lorenza Cambridge M.D.   On: 02/09/2023 18:34   MR BRAIN WO CONTRAST  Result Date: 02/08/2023 CLINICAL DATA:  Neuro deficit, acute, stroke suspected. EXAM: MRI HEAD WITHOUT CONTRAST TECHNIQUE: Multiplanar, multiecho pulse sequences of the brain and surrounding structures were obtained without intravenous contrast. COMPARISON:  Head CT 02/07/2023 FINDINGS: Brain: There is a moderate-sized acute to early subacute posterior right MCA infarct involving portions of the parietal lobe, lateral occipital lobe, and posterior temporal and frontal lobes. A single focus of microhemorrhage is noted in the right parietal lobe and may be chronic. There is cytotoxic edema associated with the infarct with mild sulcal effacement but no midline shift or other significant mass effect. There are small chronic left parieto-occipital cortical infarcts. T2 hyperintensities in the cerebral white matter bilaterally are nonspecific but compatible with moderate chronic small vessel ischemic disease. Chronic lacunar infarcts are noted in the deep cerebral white matter bilaterally. T2 hyperintensities throughout the basal ganglia bilaterally predominantly reflect dilated perivascular spaces, possibly with some interspersed chronic  lacunar infarcts. No mass or extra-axial fluid collection is evident. There is moderate cerebral atrophy. Vascular: Major intracranial vascular flow voids are preserved. Skull and upper cervical spine: Unremarkable bone marrow signal. Sinuses/Orbits: Remarkable orbits. Clear paranasal sinuses. Trace bilateral mastoid effusions. Other: None. IMPRESSION: 1. Moderate-sized acute to early subacute posterior right MCA infarct. 2. Moderate chronic small vessel ischemic disease and cerebral atrophy. Electronically Signed   By: Sebastian Ache M.D.   On: 02/08/2023 18:09   ECHOCARDIOGRAM COMPLETE  Result Date: 02/08/2023    ECHOCARDIOGRAM REPORT   Patient Name:   AMALIAH STEINBECK Date of Exam: 02/08/2023 Medical Rec #:  [id number]    Height:       65.0 in Accession #:    [id number]   Weight:       117.1 lb Date of Birth:  [birth year]    BSA:          1.575 m Patient Age:    77 years     BP:  148/82 mmHg Patient Gender: F            HR:           98 bpm. [facility] Procedure: 2D Echo, Cardiac Doppler, Color Doppler and Intracardiac            Opacification Agent Indications:    Stroke l63.9  History:        Patient has no prior history of Echocardiogram examinations.                 Risk Factors:Hypertension and Current Smoker.  Sonographer:    Celesta Gentile RCS Referring Phys: [id number] OLADAPO ADEFESO  Sonographer Comments: Technically difficult study due to poor echo windows. IMPRESSIONS  1. Left ventricular ejection fraction, by estimation, is 70 to 75%. The left ventricle has hyperdynamic function. The left ventricle has no regional wall motion abnormalities. There is mild asymmetric left ventricular hypertrophy of the basal-septal segment. Left ventricular diastolic parameters are consistent with Grade I diastolic dysfunction (impaired relaxation).  2. Right ventricular systolic function is normal. The right ventricular size is normal.  3. The mitral valve was not well visualized. No evidence of mitral  valve regurgitation.  4. The aortic valve was not well visualized. There is mild calcification of the aortic valve. Aortic valve regurgitation is not visualized. Comparison(s): No prior Echocardiogram. FINDINGS  Left Ventricle: Left ventricular ejection fraction, by estimation, is 70 to 75%. The left ventricle has hyperdynamic function. The left ventricle has no regional wall motion abnormalities. Definity contrast agent was given IV to delineate the left ventricular endocardial borders. The left ventricular internal cavity size was normal in size. There is mild asymmetric left ventricular hypertrophy of the basal-septal segment. Left ventricular diastolic parameters are consistent with Grade I diastolic dysfunction (impaired relaxation). Right Ventricle: The right ventricular size is normal. No increase in right ventricular wall thickness. Right ventricular systolic function is normal. Left Atrium: Left atrial size was normal in size. Right Atrium: Right atrial size was normal in size. Pericardium: The pericardium was not well visualized. Mitral Valve: The mitral valve was not well visualized. No evidence of mitral valve regurgitation. Tricuspid Valve: The tricuspid valve is grossly normal. Tricuspid valve regurgitation is not demonstrated. No evidence of tricuspid stenosis. Aortic Valve: The aortic valve was not well visualized. There is mild calcification of the aortic valve. Aortic valve regurgitation is not visualized. Pulmonic Valve: The pulmonic valve was not well visualized. Pulmonic valve regurgitation is not visualized. No evidence of pulmonic stenosis. Aorta: The ascending aorta was not well visualized, the aortic arch was not well visualized and the aortic root was not well visualized. IAS/Shunts: The interatrial septum was not well visualized.  LEFT VENTRICLE PLAX 2D LVIDd:         2.60 cm   Diastology LVIDs:         2.00 cm   LV e' medial:    4.24 cm/s LV PW:         0.90 cm   LV E/e' medial:  11.8 LV  IVS:        1.10 cm   LV e' lateral:   7.29 cm/s LVOT diam:     1.70 cm   LV E/e' lateral: 6.9 LV SV:         33 LV SV Index:   21 LVOT Area:     2.27 cm  RIGHT VENTRICLE RV S prime:     26.90 cm/s TAPSE (M-mode): 1.7 cm LEFT ATRIUM  Index        RIGHT ATRIUM          Index LA diam:      2.00 cm 1.27 cm/m   RA Area:     8.06 cm LA Vol (A2C): 17.4 ml 11.04 ml/m  RA Volume:   12.70 ml 8.06 ml/m LA Vol (A4C): 13.5 ml 8.57 ml/m  AORTIC VALVE LVOT Vmax:   95.40 cm/s LVOT Vmean:  59.800 cm/s LVOT VTI:    0.146 m  AORTA Ao Root diam: 2.60 cm MITRAL VALVE                TRICUSPID VALVE MV Area (PHT): 1.27 cm     TR Peak grad:   28.7 mmHg MV Decel Time: 599 msec     TR Vmax:        268.00 cm/s MV E velocity: 50.10 cm/s MV A velocity: 106.00 cm/s  SHUNTS MV E/A ratio:  0.47         Systemic VTI:  0.15 m                             Systemic Diam: 1.70 cm Riley Lam MD Electronically signed by Riley Lam MD Signature Date/Time: 02/08/2023/5:04:37 PM    Final    US Carotid Bilateral  Result Date: 02/08/2023 CLINICAL DATA:  Stroke Fall from bed 2 days ago Hypertension Tobacco user Prior left endarterectomy EXAM: BILATERAL CAROTID DUPLEX ULTRASOUND TECHNIQUE: Wallace Cullens scale imaging, color Doppler and duplex ultrasound were performed of bilateral carotid and vertebral arteries in the neck. COMPARISON:  None available FINDINGS: Criteria: Quantification of carotid stenosis is based on velocity parameters that correlate the residual internal carotid diameter with NASCET-based stenosis levels, using the diameter of the distal internal carotid lumen as the denominator for stenosis measurement. The following velocity measurements were obtained: RIGHT ICA: 205/57 cm/sec CCA: 46/16 cm/sec SYSTOLIC ICA/CCA RATIO:  4.5 ECA: 150 cm/sec LEFT ICA: 481/138 cm/sec CCA: 56/6 cm/sec SYSTOLIC ICA/CCA RATIO:  8.6 ECA: 45 cm/sec RIGHT CAROTID ARTERY: Extensive calcified plaque of the right internal carotid artery  origin. RIGHT VERTEBRAL ARTERY:  Antegrade flow. LEFT CAROTID ARTERY: Mild scattered atheromatous plaque of the common carotid artery. Minimal mixed echogenicity plaque of the left carotid bulb. LEFT VERTEBRAL ARTERY:  Antegrade flow. IMPRESSION: 1. Extensive atheromatous plaque of the right ICA origin with velocity parameters consistent with 50-69% stenosis. 2. Post endarterectomy changes of the left internal carotid artery are seen. Minimal mixed echogenicity plaque noted in the left carotid bulb. Significantly elevated velocity in the proximal left internal carotid artery suggestive of greater than 70% stenosis is discordant with the grayscale appearance of this segment. The exact degree of stenosis is difficult to determine given the discordant findings. 3. Given the discordant findings of the left internal carotid artery and the extensive atheromatous plaque of the right internal carotid artery, further evaluation with CT angiography of the neck should be performed for more precise quantification of stenosis. Electronically Signed   By: Acquanetta Belling M.D.   On: 02/08/2023 16:09   CT HEAD WO CONTRAST  Result Date: 02/07/2023 CLINICAL DATA:  Recent fall yesterday with increasing left-sided weakness, initial encounter EXAM: CT HEAD WITHOUT CONTRAST TECHNIQUE: Contiguous axial images were obtained from the base of the skull through the vertex without intravenous contrast. RADIATION DOSE REDUCTION: This exam was performed according to the departmental dose-optimization program which includes automated exposure control, adjustment of the mA and/or kV  according to patient size and/or use of iterative reconstruction technique. COMPARISON:  01/24/2023 FINDINGS: Brain: No acute hemorrhage or space-occupying mass lesion is noted. Generalized atrophic changes are seen. There is a geographic area of decreased attenuation identified the right temporoparietal region posteriorly consistent within the evolving infarct likely  of a subacute nature in the degree of decreased attenuation. Scattered chronic white matter ischemic changes are noted. 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 Vascular: No hyperdense vessel or unexpected calcification. Skull: Normal. Negative for fracture or focal lesion. Sinuses/Orbits: No acute finding. Other: None. IMPRESSION: Geographic area of decreased attenuation in the right posterior parietal lobe extending inferiorly into the posterior temporal lobe consistent with subacute infarct. No focal abnormality is seen. Generalized atrophic changes are noted stable from the prior study. Electronically Signed   By: Alcide Clever M.D.   On: 02/07/2023 22:35   CT Head Wo Contrast  Result Date: 01/24/2023 CLINICAL DATA:  Transient ischemic attack. Unsteady. Weakness. Recent fall. EXAM: CT HEAD WITHOUT CONTRAST TECHNIQUE: Contiguous axial images were obtained from the base of the skull through the vertex without intravenous contrast. RADIATION DOSE REDUCTION: This exam was performed according to the departmental dose-optimization program which includes automated exposure control, adjustment of the mA and/or kV according to patient size and/or use of iterative reconstruction technique. COMPARISON:  04/09/2015 FINDINGS: Brain: No evidence of intracranial hemorrhage, acute infarction, hydrocephalus, extra-axial collection, or mass lesion/mass effect. Moderate to severe diffuse cerebral and cerebellar atrophy shows mild increase since prior study. Old left posterior parietal infarct again noted. Moderate chronic small vessel disease again demonstrated. Vascular:  No hyperdense vessel or other acute findings. Skull: No evidence of fracture or other significant bone abnormality. Sinuses/Orbits:  No acute findings. Other: None. IMPRESSION: No acute intracranial abnormality. Diffuse cerebral atrophy, chronic small vessel disease, and old left posterior parietal infarct. Electronically Signed   By: Danae Orleans M.D.    On: 01/24/2023 16:27   DG Chest Portable 1 View  Result Date: 01/24/2023 CLINICAL DATA:  Weakness and fatigue. EXAM: PORTABLE CHEST 1 VIEW COMPARISON:  One-view chest x-ray 04/09/2015 FINDINGS: Heart size is normal. Atherosclerotic changes are present at the aortic arch. Changes of COPD are noted. No focal airspace disease is present. No edema or effusion is present. The visualized soft tissues and bony thorax are unremarkable. IMPRESSION: 1. No acute cardiopulmonary disease. 2. COPD. Electronically Signed   By: Marin Roberts M.D.   On: 01/24/2023 15:13   DG Hip Unilat W or Wo Pelvis 2-3 Views Left  Result Date: 01/15/2023 CLINICAL DATA:  Low back and left hip pain since falling in May. EXAM: DG HIP (WITH OR WITHOUT PELVIS) 2-3V LEFT; LUMBAR SPINE - COMPLETE 4+ VIEW COMPARISON:  Lumbar spine radiographs 03/13/2010, abdominopelvic CT 07/07/2015 and hip radiographs 03/14/2016 and 02/10/2017. FINDINGS: Lumbar spine: Transitional lumbosacral anatomy with a largely sacralized L5 segment. Progressive convex right thoracolumbar scoliosis centered at the thoracolumbar junction. Assessment of the lateral alignment is limited by the scoliosis and obliquity on the lateral view. There may be a mild retrolisthesis at L2-3. There is multilevel spondylosis with disc space narrowing, endplate osteophytes and facet hypertrophy. No evidence of acute fracture or pars defect. Cholecystectomy clips, diffuse aortoiliac atherosclerosis and sequela of chronic calcific pancreatitis noted. Left hip: Stable postsurgical changes from previous left hip bipolar hemiarthroplasty. The hardware appears intact without evidence of loosening. Unchanged heterotopic calcification adjacent to the left greater trochanter. Unchanged mild right hip and sacroiliac degenerative changes. No evidence of acute fracture or  dislocation. IMPRESSION: 1. No evidence of acute lumbar spine or left hip fracture. 2. Progressive convex right thoracolumbar  scoliosis and multilevel spondylosis. 3. Stable postsurgical changes from previous left hip bipolar hemiarthroplasty. Electronically Signed   By: Carey Bullocks M.D.   On: 01/15/2023 12:51   DG Lumbar Spine Complete  Result Date: 01/15/2023 CLINICAL DATA:  Low back and left hip pain since falling in May. EXAM: DG HIP (WITH OR WITHOUT PELVIS) 2-3V LEFT; LUMBAR SPINE - COMPLETE 4+ VIEW COMPARISON:  Lumbar spine radiographs 03/13/2010, abdominopelvic CT 07/07/2015 and hip radiographs 03/14/2016 and 02/10/2017. FINDINGS: Lumbar spine: Transitional lumbosacral anatomy with a largely sacralized L5 segment. Progressive convex right thoracolumbar scoliosis centered at the thoracolumbar junction. Assessment of the lateral alignment is limited by the scoliosis and obliquity on the lateral view. There may be a mild retrolisthesis at L2-3. There is multilevel spondylosis with disc space narrowing, endplate osteophytes and facet hypertrophy. No evidence of acute fracture or pars defect. Cholecystectomy clips, diffuse aortoiliac atherosclerosis and sequela of chronic calcific pancreatitis noted. Left hip: Stable postsurgical changes from previous left hip bipolar hemiarthroplasty. The hardware appears intact without evidence of loosening. Unchanged heterotopic calcification adjacent to the left greater trochanter. Unchanged mild right hip and sacroiliac degenerative changes. No evidence of acute fracture or dislocation. IMPRESSION: 1. No evidence of acute lumbar spine or left hip fracture. 2. Progressive convex right thoracolumbar scoliosis and multilevel spondylosis. 3. Stable postsurgical changes from previous left hip bipolar hemiarthroplasty. Electronically Signed   By: Carey Bullocks M.D.   On: 01/15/2023 12:51    Microbiology: Recent Results (from the past 240 hour(s))  MRSA Next Gen by PCR, Nasal     Status: None   Collection Time: 02/08/23 12:47 AM   Specimen: Nasal Mucosa; Nasal Swab  Result Value Ref Range  Status   MRSA by PCR Next Gen NOT DETECTED NOT DETECTED Final    Comment: (NOTE) The GeneXpert MRSA Assay (FDA approved for NASAL specimens only), is one component of a comprehensive MRSA colonization surveillance program. It is not intended to diagnose MRSA infection nor to guide or monitor treatment for MRSA infections. Test performance is not FDA approved in patients less than 58 years old. Performed at [facility], [address] [place], [place] [address]      Labs: Basic Metabolic Panel: Recent Labs  Lab 02/08/23 0549 02/08/23 1507 02/09/23 0437 02/10/23 0500 02/11/23 0701 02/12/23 0249 02/13/23 1032  NA 118*   < > 123* 127* 130* 129* 131*  K 4.1   < > 4.2 3.3* 4.3 4.0 3.7  CL 89*   < > 96* 100 104 101 96*  CO2 17*   < > 19* 17* 13* 17* 21*  GLUCOSE 96   < > 96 66* 84 94 110*  BUN 36*   < > 33* 21 17 17  31*  CREATININE 1.14*   < > 1.02* 0.78 1.28* 0.97 0.94  CALCIUM 8.4*   < > 8.2* 7.8* 8.1* 8.3* 8.5*  MG 1.9  --   --   --   --   --   --   PHOS 3.4  --  2.8  --  2.2*  --   --    < > = values in this interval not displayed.   Liver Function Tests: Recent Labs  Lab 02/07/23 2124 02/08/23 0549 02/09/23 0437 02/11/23 0701 02/12/23 0249  AST 36 39  --   --  49*  ALT 19 17  --   --  20  ALKPHOS 52 45  --   --  37*  BILITOT 1.2 1.2  --   --  1.0  PROT 7.1 6.1*  --   --  5.0*  ALBUMIN 4.1 3.6 3.1* 2.8* 2.8*   No results for input(s): "LIPASE", "AMYLASE" in the last 168 hours. No results for input(s): "AMMONIA" in the last 168 hours. CBC: Recent Labs  Lab 02/07/23 2124 02/07/23 2132 02/08/23 0549 02/11/23 0701 02/12/23 0249  WBC 19.1*  --  16.3* 14.8* 12.2*  NEUTROABS 14.3*  --   --   --  9.0*  HGB 13.7 15.3* 11.9* 9.4* 9.2*  HCT 38.7 45.0 34.1* 28.7* 26.9*  MCV 95.3  --  96.3 104.4* 100.4*  PLT 363  --  300 226 252   Cardiac Enzymes: No results for input(s): "CKTOTAL", "CKMB", "CKMBINDEX", "TROPONINI" in the last 168 hours. BNP: BNP (last 3  results) No results for input(s): "BNP" in the last 8760 hours.  ProBNP (last 3 results) No results for input(s): "PROBNP" in the last 8760 hours.  CBG: No results for input(s): "GLUCAP" in the last 168 hours.  Signed:  Rhetta Mura MD   [facility] 02/13/2023, 2:31 PM

## 2023-02-13 NOTE — TOC Transition Note (Signed)
Transition of Care [facility]) - CM/SW Discharge Note   Patient Details  Name: [name] MRN: [id number] Date of Birth: 04-30-1946  [facility] CM/SW Contact:  Baldemar Lenis, LCSW Phone Number: 02/13/2023, 3:24 PM   Clinical Narrative:   CSW notified that patient is medically stable for discharge to SNF. CSW confirmed with [facility] that bed is still available. CSW sent discharge summary. CSW notified daughter, [name], she is in agreement. Transport arranged with [facility] for next available.  Nurse to call report to [phone] Room A4 Bed 2    Final next level of care: Skilled Nursing Facility Barriers to Discharge: Barriers Resolved   Patient Goals and CMS Choice CMS Medicare.gov Compare Post Acute Care list provided to:: Patient Choice offered to / list presented to : Patient  Discharge Placement                Patient chooses bed at:  [facility]) Patient to be transferred to facility by: [facility] Name of family member notified: Kitty Patient and family notified of of transfer: 02/13/23  Discharge Plan and Services Additional resources added to the After Visit Summary for                                       Social Determinants of Health (SDOH) Interventions SDOH Screenings   Food Insecurity: No Food Insecurity (02/08/2023)  Housing: Low Risk  (02/08/2023)  Transportation Needs: No Transportation Needs (02/08/2023)  Utilities: Not At Risk (02/08/2023)  Tobacco Use: High Risk (02/07/2023)     Readmission Risk Interventions     No data to display

## 2023-02-16 DIAGNOSIS — G894 Chronic pain syndrome: Secondary | ICD-10-CM | POA: Diagnosis not present

## 2023-02-16 DIAGNOSIS — M6281 Muscle weakness (generalized): Secondary | ICD-10-CM | POA: Diagnosis not present

## 2023-02-16 DIAGNOSIS — E876 Hypokalemia: Secondary | ICD-10-CM | POA: Diagnosis not present

## 2023-02-16 DIAGNOSIS — E871 Hypo-osmolality and hyponatremia: Secondary | ICD-10-CM | POA: Diagnosis not present

## 2023-02-16 DIAGNOSIS — I679 Cerebrovascular disease, unspecified: Secondary | ICD-10-CM | POA: Diagnosis not present

## 2023-02-16 DIAGNOSIS — I639 Cerebral infarction, unspecified: Secondary | ICD-10-CM | POA: Diagnosis not present

## 2023-02-16 DIAGNOSIS — N179 Acute kidney failure, unspecified: Secondary | ICD-10-CM | POA: Diagnosis not present

## 2023-02-18 DIAGNOSIS — E871 Hypo-osmolality and hyponatremia: Secondary | ICD-10-CM | POA: Diagnosis not present

## 2023-02-18 DIAGNOSIS — G894 Chronic pain syndrome: Secondary | ICD-10-CM | POA: Diagnosis not present

## 2023-02-18 DIAGNOSIS — I639 Cerebral infarction, unspecified: Secondary | ICD-10-CM | POA: Diagnosis not present

## 2023-02-18 DIAGNOSIS — E876 Hypokalemia: Secondary | ICD-10-CM | POA: Diagnosis not present

## 2023-02-18 DIAGNOSIS — M6281 Muscle weakness (generalized): Secondary | ICD-10-CM | POA: Diagnosis not present

## 2023-02-18 DIAGNOSIS — I679 Cerebrovascular disease, unspecified: Secondary | ICD-10-CM | POA: Diagnosis not present

## 2023-02-18 DIAGNOSIS — N179 Acute kidney failure, unspecified: Secondary | ICD-10-CM | POA: Diagnosis not present

## 2023-02-19 ENCOUNTER — Observation Stay (HOSPITAL_COMMUNITY): Payer: Medicare Other

## 2023-02-19 ENCOUNTER — Emergency Department (HOSPITAL_COMMUNITY): Payer: Medicare Other

## 2023-02-19 ENCOUNTER — Other Ambulatory Visit: Payer: Self-pay

## 2023-02-19 ENCOUNTER — Inpatient Hospital Stay (HOSPITAL_COMMUNITY)
Admission: EM | Admit: 2023-02-19 | Discharge: 2023-02-26 | DRG: 871 | Disposition: A | Discharge status: Skilled Nursing Facility | Payer: Medicare Other | Source: Skilled Nursing Facility | Attending: Family Medicine | Admitting: Family Medicine

## 2023-02-19 ENCOUNTER — Encounter (HOSPITAL_COMMUNITY): Payer: Self-pay

## 2023-02-19 DIAGNOSIS — N390 Urinary tract infection, site not specified: Secondary | ICD-10-CM | POA: Diagnosis present

## 2023-02-19 DIAGNOSIS — K5000 Crohn's disease of small intestine without complications: Secondary | ICD-10-CM | POA: Diagnosis not present

## 2023-02-19 DIAGNOSIS — I639 Cerebral infarction, unspecified: Secondary | ICD-10-CM | POA: Diagnosis not present

## 2023-02-19 DIAGNOSIS — K922 Gastrointestinal hemorrhage, unspecified: Secondary | ICD-10-CM | POA: Diagnosis present

## 2023-02-19 DIAGNOSIS — I6521 Occlusion and stenosis of right carotid artery: Secondary | ICD-10-CM | POA: Diagnosis not present

## 2023-02-19 DIAGNOSIS — E860 Dehydration: Secondary | ICD-10-CM | POA: Diagnosis not present

## 2023-02-19 DIAGNOSIS — Z515 Encounter for palliative care: Secondary | ICD-10-CM | POA: Diagnosis not present

## 2023-02-19 DIAGNOSIS — N3001 Acute cystitis with hematuria: Secondary | ICD-10-CM | POA: Diagnosis not present

## 2023-02-19 DIAGNOSIS — Z8249 Family history of ischemic heart disease and other diseases of the circulatory system: Secondary | ICD-10-CM

## 2023-02-19 DIAGNOSIS — B377 Candidal sepsis: Secondary | ICD-10-CM | POA: Diagnosis not present

## 2023-02-19 DIAGNOSIS — R000 Tachycardia, unspecified: Secondary | ICD-10-CM | POA: Diagnosis not present

## 2023-02-19 DIAGNOSIS — B49 Unspecified mycosis: Secondary | ICD-10-CM | POA: Diagnosis not present

## 2023-02-19 DIAGNOSIS — Z823 Family history of stroke: Secondary | ICD-10-CM

## 2023-02-19 DIAGNOSIS — R6889 Other general symptoms and signs: Secondary | ICD-10-CM | POA: Diagnosis not present

## 2023-02-19 DIAGNOSIS — Z7189 Other specified counseling: Secondary | ICD-10-CM | POA: Diagnosis not present

## 2023-02-19 DIAGNOSIS — I2489 Other forms of acute ischemic heart disease: Secondary | ICD-10-CM | POA: Diagnosis present

## 2023-02-19 DIAGNOSIS — F17210 Nicotine dependence, cigarettes, uncomplicated: Secondary | ICD-10-CM | POA: Diagnosis not present

## 2023-02-19 DIAGNOSIS — N179 Acute kidney failure, unspecified: Secondary | ICD-10-CM | POA: Diagnosis not present

## 2023-02-19 DIAGNOSIS — I499 Cardiac arrhythmia, unspecified: Secondary | ICD-10-CM | POA: Diagnosis not present

## 2023-02-19 DIAGNOSIS — R109 Unspecified abdominal pain: Secondary | ICD-10-CM | POA: Diagnosis not present

## 2023-02-19 DIAGNOSIS — D72829 Elevated white blood cell count, unspecified: Secondary | ICD-10-CM | POA: Diagnosis not present

## 2023-02-19 DIAGNOSIS — Z681 Body mass index (BMI) 19 or less, adult: Secondary | ICD-10-CM

## 2023-02-19 DIAGNOSIS — Z1152 Encounter for screening for COVID-19: Secondary | ICD-10-CM | POA: Diagnosis not present

## 2023-02-19 DIAGNOSIS — G894 Chronic pain syndrome: Secondary | ICD-10-CM | POA: Diagnosis not present

## 2023-02-19 DIAGNOSIS — I491 Atrial premature depolarization: Secondary | ICD-10-CM | POA: Diagnosis present

## 2023-02-19 DIAGNOSIS — M6281 Muscle weakness (generalized): Secondary | ICD-10-CM | POA: Diagnosis not present

## 2023-02-19 DIAGNOSIS — I10 Essential (primary) hypertension: Secondary | ICD-10-CM | POA: Diagnosis not present

## 2023-02-19 DIAGNOSIS — Z4682 Encounter for fitting and adjustment of non-vascular catheter: Secondary | ICD-10-CM | POA: Diagnosis not present

## 2023-02-19 DIAGNOSIS — A4151 Sepsis due to Escherichia coli [E. coli]: Secondary | ICD-10-CM | POA: Diagnosis not present

## 2023-02-19 DIAGNOSIS — I7100 Dissection of unspecified site of aorta: Secondary | ICD-10-CM | POA: Diagnosis present

## 2023-02-19 DIAGNOSIS — R1111 Vomiting without nausea: Secondary | ICD-10-CM | POA: Diagnosis not present

## 2023-02-19 DIAGNOSIS — D649 Anemia, unspecified: Secondary | ICD-10-CM | POA: Diagnosis present

## 2023-02-19 DIAGNOSIS — Z79899 Other long term (current) drug therapy: Secondary | ICD-10-CM

## 2023-02-19 DIAGNOSIS — K8689 Other specified diseases of pancreas: Secondary | ICD-10-CM | POA: Diagnosis not present

## 2023-02-19 DIAGNOSIS — E871 Hypo-osmolality and hyponatremia: Secondary | ICD-10-CM | POA: Diagnosis not present

## 2023-02-19 DIAGNOSIS — R9431 Abnormal electrocardiogram [ECG] [EKG]: Secondary | ICD-10-CM | POA: Diagnosis not present

## 2023-02-19 DIAGNOSIS — Z96642 Presence of left artificial hip joint: Secondary | ICD-10-CM | POA: Diagnosis present

## 2023-02-19 DIAGNOSIS — I679 Cerebrovascular disease, unspecified: Secondary | ICD-10-CM | POA: Diagnosis not present

## 2023-02-19 DIAGNOSIS — R6520 Severe sepsis without septic shock: Secondary | ICD-10-CM | POA: Diagnosis not present

## 2023-02-19 DIAGNOSIS — K56609 Unspecified intestinal obstruction, unspecified as to partial versus complete obstruction: Principal | ICD-10-CM

## 2023-02-19 DIAGNOSIS — R339 Retention of urine, unspecified: Secondary | ICD-10-CM | POA: Diagnosis present

## 2023-02-19 DIAGNOSIS — E876 Hypokalemia: Secondary | ICD-10-CM | POA: Diagnosis not present

## 2023-02-19 DIAGNOSIS — K50911 Crohn's disease, unspecified, with rectal bleeding: Secondary | ICD-10-CM | POA: Diagnosis not present

## 2023-02-19 DIAGNOSIS — R519 Headache, unspecified: Secondary | ICD-10-CM | POA: Diagnosis not present

## 2023-02-19 DIAGNOSIS — I129 Hypertensive chronic kidney disease with stage 1 through stage 4 chronic kidney disease, or unspecified chronic kidney disease: Secondary | ICD-10-CM | POA: Diagnosis not present

## 2023-02-19 DIAGNOSIS — K5651 Intestinal adhesions [bands], with partial obstruction: Secondary | ICD-10-CM | POA: Diagnosis present

## 2023-02-19 DIAGNOSIS — N189 Chronic kidney disease, unspecified: Secondary | ICD-10-CM | POA: Diagnosis present

## 2023-02-19 DIAGNOSIS — Z9049 Acquired absence of other specified parts of digestive tract: Secondary | ICD-10-CM

## 2023-02-19 DIAGNOSIS — Z743 Need for continuous supervision: Secondary | ICD-10-CM | POA: Diagnosis not present

## 2023-02-19 DIAGNOSIS — E878 Other disorders of electrolyte and fluid balance, not elsewhere classified: Secondary | ICD-10-CM | POA: Diagnosis present

## 2023-02-19 DIAGNOSIS — I69354 Hemiplegia and hemiparesis following cerebral infarction affecting left non-dominant side: Secondary | ICD-10-CM | POA: Diagnosis not present

## 2023-02-19 DIAGNOSIS — Z888 Allergy status to other drugs, medicaments and biological substances status: Secondary | ICD-10-CM

## 2023-02-19 DIAGNOSIS — R54 Age-related physical debility: Secondary | ICD-10-CM | POA: Diagnosis present

## 2023-02-19 DIAGNOSIS — K5090 Crohn's disease, unspecified, without complications: Secondary | ICD-10-CM | POA: Diagnosis present

## 2023-02-19 DIAGNOSIS — M1990 Unspecified osteoarthritis, unspecified site: Secondary | ICD-10-CM | POA: Diagnosis present

## 2023-02-19 DIAGNOSIS — I6782 Cerebral ischemia: Secondary | ICD-10-CM | POA: Diagnosis not present

## 2023-02-19 DIAGNOSIS — K5750 Diverticulosis of both small and large intestine without perforation or abscess without bleeding: Secondary | ICD-10-CM | POA: Diagnosis not present

## 2023-02-19 DIAGNOSIS — R112 Nausea with vomiting, unspecified: Secondary | ICD-10-CM | POA: Diagnosis not present

## 2023-02-19 DIAGNOSIS — Z7982 Long term (current) use of aspirin: Secondary | ICD-10-CM

## 2023-02-19 DIAGNOSIS — Z7902 Long term (current) use of antithrombotics/antiplatelets: Secondary | ICD-10-CM

## 2023-02-19 DIAGNOSIS — Z66 Do not resuscitate: Secondary | ICD-10-CM | POA: Diagnosis not present

## 2023-02-19 DIAGNOSIS — I63511 Cerebral infarction due to unspecified occlusion or stenosis of right middle cerebral artery: Secondary | ICD-10-CM | POA: Diagnosis not present

## 2023-02-19 DIAGNOSIS — M5431 Sciatica, right side: Secondary | ICD-10-CM | POA: Diagnosis present

## 2023-02-19 DIAGNOSIS — E440 Moderate protein-calorie malnutrition: Secondary | ICD-10-CM | POA: Insufficient documentation

## 2023-02-19 DIAGNOSIS — K3189 Other diseases of stomach and duodenum: Secondary | ICD-10-CM | POA: Diagnosis present

## 2023-02-19 DIAGNOSIS — A419 Sepsis, unspecified organism: Secondary | ICD-10-CM | POA: Diagnosis not present

## 2023-02-19 DIAGNOSIS — K861 Other chronic pancreatitis: Secondary | ICD-10-CM | POA: Diagnosis not present

## 2023-02-19 LAB — URINALYSIS, ROUTINE W REFLEX MICROSCOPIC
Bilirubin Urine: NEGATIVE
Glucose, UA: NEGATIVE mg/dL
Ketones, ur: NEGATIVE mg/dL
Nitrite: NEGATIVE
Protein, ur: 30 mg/dL — AB
Specific Gravity, Urine: 1.014 (ref 1.005–1.030)
WBC, UA: >50 WBC/hpf (ref 0–5)
pH: 8 (ref 5.0–8.0)

## 2023-02-19 LAB — COMPREHENSIVE METABOLIC PANEL WITH GFR
ALT: 23 U/L (ref 0–44)
AST: 29 U/L (ref 15–41)
Albumin: 3.4 g/dL — ABNORMAL LOW (ref 3.5–5.0)
Alkaline Phosphatase: 49 U/L (ref 38–126)
Anion gap: 21 — ABNORMAL HIGH (ref 5–15)
BUN: 51 mg/dL — ABNORMAL HIGH (ref 8–23)
CO2: 22 mmol/L (ref 22–32)
Calcium: 9.6 mg/dL (ref 8.9–10.3)
Chloride: 93 mmol/L — ABNORMAL LOW (ref 98–111)
Creatinine, Ser: 1.38 mg/dL — ABNORMAL HIGH (ref 0.44–1.00)
GFR, Estimated: 39 mL/min — ABNORMAL LOW
Glucose, Bld: 136 mg/dL — ABNORMAL HIGH (ref 70–99)
Potassium: 3.4 mmol/L — ABNORMAL LOW (ref 3.5–5.1)
Sodium: 136 mmol/L (ref 135–145)
Total Bilirubin: 1 mg/dL (ref 0.3–1.2)
Total Protein: 6.2 g/dL — ABNORMAL LOW (ref 6.5–8.1)

## 2023-02-19 LAB — CBC WITH DIFFERENTIAL/PLATELET
Abs Immature Granulocytes: 0.16 10*3/uL — ABNORMAL HIGH (ref 0.00–0.07)
Basophils Absolute: 0.1 10*3/uL (ref 0.0–0.1)
Basophils Relative: 0 %
Eosinophils Absolute: 0 10*3/uL (ref 0.0–0.5)
Eosinophils Relative: 0 %
HCT: 28.1 % — ABNORMAL LOW (ref 36.0–46.0)
Hemoglobin: 9.5 g/dL — ABNORMAL LOW (ref 12.0–15.0)
Immature Granulocytes: 1 %
Lymphocytes Relative: 4 %
Lymphs Abs: 1.1 10*3/uL (ref 0.7–4.0)
MCH: 34.8 pg — ABNORMAL HIGH (ref 26.0–34.0)
MCHC: 33.8 g/dL (ref 30.0–36.0)
MCV: 102.9 fL — ABNORMAL HIGH (ref 80.0–100.0)
Monocytes Absolute: 1 10*3/uL (ref 0.1–1.0)
Monocytes Relative: 3 %
Neutro Abs: 26.8 10*3/uL — ABNORMAL HIGH (ref 1.7–7.7)
Neutrophils Relative %: 92 %
Platelets: 343 10*3/uL (ref 150–400)
RBC: 2.73 MIL/uL — ABNORMAL LOW (ref 3.87–5.11)
RDW: 13.6 % (ref 11.5–15.5)
WBC: 29.2 10*3/uL — ABNORMAL HIGH (ref 4.0–10.5)
nRBC: 0 % (ref 0.0–0.2)

## 2023-02-19 LAB — I-STAT CHEM 8, ED
BUN: 47 mg/dL — ABNORMAL HIGH (ref 8–23)
Calcium, Ion: 1.12 mmol/L — ABNORMAL LOW (ref 1.15–1.40)
Chloride: 96 mmol/L — ABNORMAL LOW (ref 98–111)
Creatinine, Ser: 1.5 mg/dL — ABNORMAL HIGH (ref 0.44–1.00)
Glucose, Bld: 133 mg/dL — ABNORMAL HIGH (ref 70–99)
HCT: 30 % — ABNORMAL LOW (ref 36.0–46.0)
Hemoglobin: 10.2 g/dL — ABNORMAL LOW (ref 12.0–15.0)
Potassium: 3.6 mmol/L (ref 3.5–5.1)
Sodium: 135 mmol/L (ref 135–145)
TCO2: 29 mmol/L (ref 22–32)

## 2023-02-19 LAB — CBG MONITORING, ED: Glucose-Capillary: 109 mg/dL — ABNORMAL HIGH (ref 70–99)

## 2023-02-19 LAB — LACTIC ACID, PLASMA: Lactic Acid, Venous: 1.3 mmol/L (ref 0.5–1.9)

## 2023-02-19 LAB — SARS CORONAVIRUS 2 BY RT PCR: SARS Coronavirus 2 by RT PCR: NEGATIVE

## 2023-02-19 LAB — LIPASE, BLOOD: Lipase: 64 U/L — ABNORMAL HIGH (ref 11–51)

## 2023-02-19 MED ORDER — HEPARIN SODIUM (PORCINE) 5000 UNIT/ML IJ SOLN: 60.0000 [IU]/kg | Freq: Once | INTRAMUSCULAR | Status: DC

## 2023-02-19 MED ORDER — PIPERACILLIN-TAZOBACTAM 3.375 G IVPB 30 MIN
3.3750 g | Freq: Once | INTRAVENOUS | Status: AC
  Administered 2023-02-19: 3.375 g via INTRAVENOUS
  Filled 2023-02-19: qty 50

## 2023-02-19 MED ORDER — IOHEXOL 300 MG/ML  SOLN
75.0000 mL | Freq: Once | INTRAMUSCULAR | Status: AC | PRN
  Administered 2023-02-19: 75 mL via INTRAVENOUS

## 2023-02-19 MED ORDER — MORPHINE SULFATE (PF) 2 MG/ML IV SOLN
2.0000 mg | INTRAVENOUS | Status: DC | PRN
  Administered 2023-02-19: 2 mg via INTRAVENOUS
  Filled 2023-02-19: qty 1

## 2023-02-19 MED ORDER — LACTATED RINGERS IV BOLUS
1000.0000 mL | Freq: Once | INTRAVENOUS | Status: AC
  Administered 2023-02-19: 1000 mL via INTRAVENOUS

## 2023-02-19 MED ORDER — ONDANSETRON HCL 4 MG/2ML IJ SOLN
4.0000 mg | Freq: Once | INTRAMUSCULAR | Status: AC
  Administered 2023-02-19: 4 mg via INTRAVENOUS
  Filled 2023-02-19: qty 2

## 2023-02-19 MED ORDER — PIPERACILLIN-TAZOBACTAM 3.375 G IVPB
3.3750 g | Freq: Three times a day (TID) | INTRAVENOUS | Status: DC
  Administered 2023-02-19 – 2023-02-23 (×11): 3.375 g via INTRAVENOUS
  Filled 2023-02-19 (×13): qty 50

## 2023-02-19 MED ORDER — KCL IN DEXTROSE-NACL 20-5-0.9 MEQ/L-%-% IV SOLN
INTRAVENOUS | Status: DC
  Filled 2023-02-19 (×5): qty 1000

## 2023-02-19 MED ORDER — SODIUM CHLORIDE 0.9 % IV BOLUS
1000.0000 mL | Freq: Once | INTRAVENOUS | Status: AC
  Administered 2023-02-19: 1000 mL via INTRAVENOUS

## 2023-02-19 MED ORDER — ASPIRIN 300 MG RE SUPP
300.0000 mg | Freq: Once | RECTAL | Status: AC
  Administered 2023-02-20: 300 mg via RECTAL
  Filled 2023-02-19: qty 1

## 2023-02-19 MED ORDER — FENTANYL CITRATE PF 50 MCG/ML IJ SOSY
50.0000 ug | PREFILLED_SYRINGE | Freq: Once | INTRAMUSCULAR | Status: AC
  Administered 2023-02-19: 50 ug via INTRAVENOUS
  Filled 2023-02-19: qty 1

## 2023-02-19 MED ORDER — CHLORHEXIDINE GLUCONATE CLOTH 2 % EX PADS
6.0000 | MEDICATED_PAD | Freq: Every day | CUTANEOUS | Status: DC
  Administered 2023-02-20 – 2023-02-25 (×6): 6 via TOPICAL

## 2023-02-19 NOTE — Assessment & Plan Note (Addendum)
Presents with abdominal pain distention nausea and vomiting. CT abdomen and pelvis with contrast small-bowel obstruction with transition point in the low posterior abdomen.  Per med list from nursing home at bedside -patient got Plavix yesterday, and it appears she got it today. - EDP talked to Dr. Robyne Peers, request NG tube, will see in consult - N.p.o. - 2 L bolus continue, D5+N/s+20 KCL 75cc/hr -IV morphine 2 mg every 4 hourly as needed -Hold Plavix pending general surgery evaluation and NPO status - CBG q8h while NPO

## 2023-02-19 NOTE — ED Notes (Signed)
Pt just returned from CT. Nad. No changes. States abd pain is better but head still hurts a lot. A/o. Mouth patted with wet cloth per request.

## 2023-02-19 NOTE — Assessment & Plan Note (Signed)
Mild AKI, creatinine 1.38, baseline 0.7-1.  In the setting of sepsis.

## 2023-02-19 NOTE — ED Notes (Signed)
Pt still in ct.  

## 2023-02-19 NOTE — ED Notes (Signed)
See triage notes. Pt a/o. Gen weakness noted. Pale in color. Abd distended and firm. Pt c/o headache and abd pain. Pt moving all extremities but weak all over.

## 2023-02-19 NOTE — Assessment & Plan Note (Addendum)
History of upper GI bleed.  Hemoglobin 9.5 today, baseline 11-13.  At this time no evidence of GI blood loss.  On Plavix.   -Trend hemoglobin for now

## 2023-02-19 NOTE — ED Provider Notes (Signed)
This patient is a critically ill appearing 77 year old female with chronic medical problems, prior abdominal surgery, Crohn's disease, hypertension, prior stroke and was recently admitted for the same, she is currently in a nursing facility for rehab.  Presenting with abdominal pain distention, vomiting, tachycardic, weak, very ill-appearing and dehydrated in the mucous membranes.  Her abdomen is distended, tympanitic to percussion and diffusely tender.  Labs imaging fluids antibiotics, likely admit, I suspect there is something pathological going on.  Severe leukocytosis, critically ill  I suspect that the patient has a high-grade small bowel obstruction although other surgical causes could be equally as likely, she will need to be admitted to the hospital and fluid resuscitation will need to start  .Critical Care  Performed by: Eber Hong, MD Authorized by: Eber Hong, MD   Critical care provider statement:    Critical care time (minutes):  45   Critical care time was exclusive of:  Separately billable procedures and treating other patients   Critical care was time spent personally by me on the following activities:  Development of treatment plan with patient or surrogate, discussions with consultants, evaluation of patient's response to treatment, examination of patient, obtaining history from patient or surrogate, review of old charts, re-evaluation of patient's condition, pulse oximetry, ordering and review of radiographic studies, ordering and review of laboratory studies and ordering and performing treatments and interventions   I assumed direction of critical care for this patient from another provider in my specialty: no     Care discussed with: admitting provider   Comments:           Eber Hong, MD 02/22/23 1230

## 2023-02-19 NOTE — ED Notes (Signed)
Advanced per xray NG tube

## 2023-02-19 NOTE — ED Notes (Signed)
Pt taken to ct 

## 2023-02-19 NOTE — Assessment & Plan Note (Signed)
Hold sulfasalazine while NPO.

## 2023-02-19 NOTE — ED Notes (Signed)
Sats 87% ra after fentanyl given. Pt denies shob. 02 2L [place] applied. Edp aware.

## 2023-02-19 NOTE — ED Provider Notes (Signed)
[place] EMERGENCY DEPARTMENT AT [facility] Provider Note   CSN: [id number] Arrival date & time: 02/19/23  1343     History Chief Complaint  Patient presents with   Emesis    [name] is a 77 y.o. female with history of small bowel obstruction, hypertension, Crohn's disease, chronic kidney disease and prior abdominal surgery who presents to the emergency department with intractable nausea and vomiting for the last 3 days.  Patient arriving from nursing home facility for above chief complaint.  Patient has been unable to keep anything down.  She denies any diarrhea, fever, urinary symptoms, chills.  She does report associated abdominal pain.  Difficulty to obtain rest of history as patient does appear very uncomfortable.  Chart review reveals that the patient was recently discharged from the hospital on 02/07/2023 after having an ischemic stroke with hyponatremia.   Emesis      Home Medications Prior to Admission medications   Medication Sig Start Date End Date Taking? Authorizing Provider  acetaminophen (TYLENOL) 500 MG tablet Take 1 tablet (500 mg total) by mouth every 6 (six) hours as needed for mild pain. 04/12/15   Standley Brooking, MD  aspirin EC 81 MG tablet Take 1 tablet (81 mg total) by mouth daily. Resume in 1 week Patient taking differently: Take 81 mg by mouth daily. 02/25/15   Erick Blinks, MD  clopidogrel (PLAVIX) 75 MG tablet Take 1 tablet (75 mg total) by mouth daily. 02/14/23   Rhetta Mura, MD  HYDROcodone-acetaminophen (NORCO/VICODIN) 5-325 MG tablet Take 1 tablet by mouth every 6 (six) hours as needed for moderate pain. 02/13/23   Rhetta Mura, MD  methocarbamol (ROBAXIN) 500 MG tablet Take 1 tablet (500 mg total) by mouth every 8 (eight) hours as needed for muscle spasms. 02/13/23   Rhetta Mura, MD  metoprolol succinate (TOPROL XL) 25 MG 24 hr tablet Take 0.5 tablets (12.5 mg total) by mouth daily. 02/13/23 02/13/24  Rhetta Mura, MD  nicotine (NICODERM CQ - DOSED IN MG/24 HOURS) 21 mg/24hr patch Place 1 patch (21 mg total) onto the skin daily. 02/14/23   Rhetta Mura, MD  sodium bicarbonate 650 MG tablet Take 1 tablet (650 mg total) by mouth 2 (two) times daily. 02/13/23   Rhetta Mura, MD  sulfaSALAzine (AZULFIDINE) 500 MG tablet TAKE (2) TABLETS BY MOUTH TWICE DAILY. 11/13/22   Carlan, Chelsea L, NP  urea (URE-NA) 15 g PACK oral packet Take 15 g by mouth 2 (two) times daily. 02/13/23   Rhetta Mura, MD  witch hazel-glycerin (TUCKS) pad Apply topically as needed for itching. 02/13/23   Rhetta Mura, MD      Allergies    Flagyl [metronidazole hcl] and Other    Review of Systems   Review of Systems  Gastrointestinal:  Positive for vomiting.  All other systems reviewed and are negative.   Physical Exam Updated Vital Signs BP 120/75   Pulse (!) 120   Temp 98.2 F (36.8 C) (Oral)   Resp 17   Ht 5\' 5"  (1.651 m)   Wt 54.6 kg   SpO2 99%   BMI 20.03 kg/m  Physical Exam Vitals and nursing note reviewed.  Constitutional:      General: She is not in acute distress.    Appearance: She is ill-appearing.     Comments: Dy mucous membranes   HENT:     Head: Normocephalic and atraumatic.  Eyes:     General:  Right eye: No discharge.        Left eye: No discharge.  Cardiovascular:     Comments: Regular rate and rhythm.  S1/S2 are distinct without any evidence of murmur, rubs, or gallops.  Radial pulses are 2+ bilaterally.  Dorsalis pedis pulses are 2+ bilaterally.  No evidence of pedal edema. Pulmonary:     Comments: Clear to auscultation bilaterally.  Normal effort.  No respiratory distress.  No evidence of wheezes, rales, or rhonchi heard throughout. Abdominal:     General: Abdomen is flat. Bowel sounds are decreased. There is distension.     Tenderness: There is generalized abdominal tenderness. There is no guarding or rebound.  Musculoskeletal:        General:  Normal range of motion.     Cervical back: Neck supple.  Skin:    General: Skin is warm and dry.     Findings: No rash.  Neurological:     General: No focal deficit present.     Mental Status: She is alert.  Psychiatric:        Mood and Affect: Mood normal.        Behavior: Behavior normal.     ED Results / Procedures / Treatments   Labs (all labs ordered are listed, but only abnormal results are displayed) Labs Reviewed  CBC WITH DIFFERENTIAL/PLATELET - Abnormal; Notable for the following components:      Result Value   WBC 29.2 (*)    RBC 2.73 (*)    Hemoglobin 9.5 (*)    HCT 28.1 (*)    MCV 102.9 (*)    MCH 34.8 (*)    Neutro Abs 26.8 (*)    Abs Immature Granulocytes 0.16 (*)    All other components within normal limits  COMPREHENSIVE METABOLIC PANEL - Abnormal; Notable for the following components:   Potassium 3.4 (*)    Chloride 93 (*)    Glucose, Bld 136 (*)    BUN 51 (*)    Creatinine, Ser 1.38 (*)    Total Protein 6.2 (*)    Albumin 3.4 (*)    GFR, Estimated 39 (*)    Anion gap 21 (*)    All other components within normal limits  LIPASE, BLOOD - Abnormal; Notable for the following components:   Lipase 64 (*)    All other components within normal limits  URINALYSIS, ROUTINE W REFLEX MICROSCOPIC - Abnormal; Notable for the following components:   APPearance CLOUDY (*)    Hgb urine dipstick MODERATE (*)    Protein, ur 30 (*)    Leukocytes,Ua LARGE (*)    Bacteria, UA RARE (*)    All other components within normal limits  I-STAT CHEM 8, ED - Abnormal; Notable for the following components:   Chloride 96 (*)    BUN 47 (*)    Creatinine, Ser 1.50 (*)    Glucose, Bld 133 (*)    Calcium, Ion 1.12 (*)    Hemoglobin 10.2 (*)    HCT 30.0 (*)    All other components within normal limits  SARS CORONAVIRUS 2 BY RT PCR    EKG None  Radiology CT ABDOMEN PELVIS W CONTRAST  Result Date: 02/19/2023 CLINICAL DATA:  Nausea and vomiting for 3 days.  Abdominal pain.  EXAM: CT ABDOMEN AND PELVIS WITH CONTRAST TECHNIQUE: Multidetector CT imaging of the abdomen and pelvis was performed using the standard protocol following bolus administration of intravenous contrast. RADIATION DOSE REDUCTION: This exam was performed according to the  departmental dose-optimization program which includes automated exposure control, adjustment of the mA and/or kV according to patient size and/or use of iterative reconstruction technique. CONTRAST:  75mL OMNIPAQUE IOHEXOL 300 MG/ML  SOLN COMPARISON:  07/07/2015 FINDINGS: Lower chest: No acute abnormality. Hepatobiliary: Respiratory motion mildly degrades detail in the upper abdomen. Unremarkable liver. Cholecystectomy. Prominent bile duct likely due to reservoir effect. Pancreas: Coarse calcifications in the pancreatic head due to sequela of chronic pancreatitis. No acute pancreatitis. No ductal dilation. Spleen: Unremarkable. Adrenals/Urinary Tract: Stable adrenal glands. No urinary calculi or hydronephrosis. Nondistended bladder about a Foley catheter. Stomach/Bowel: Stomach is within normal limits. Postoperative changes with neoterminal ileum. Marked dilation of the small bowel with air-fluid levels and transition point in the low posterior abdomen (series 2/image 50). Decompressed colon. Colonic diverticulosis without diverticulitis. Duodenal diverticulum. Vascular/Lymphatic: Aortic atherosclerosis. No enlarged abdominal or pelvic lymph nodes. Reproductive: Uterus and bilateral adnexa are unremarkable. Other: No free intraperitoneal fluid or air. 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 Musculoskeletal: Left THA.  No acute fracture. IMPRESSION: 1. Small-bowel obstruction with transition point in the low posterior abdomen. Aortic Atherosclerosis (ICD10-I70.0). Electronically Signed   By: Minerva Fester M.D.   On: 02/19/2023 18:04   CT Head Wo Contrast  Result Date: 02/19/2023 CLINICAL DATA:  Headache, sudden, severe.  Nausea and  vomiting. EXAM: CT HEAD WITHOUT CONTRAST TECHNIQUE: Contiguous axial images were obtained from the base of the skull through the vertex without intravenous contrast. RADIATION DOSE REDUCTION: This exam was performed according to the departmental dose-optimization program which includes automated exposure control, adjustment of the mA and/or kV according to patient size and/or use of iterative reconstruction technique. COMPARISON:  CTA head and neck 02/09/2023.  MRI head 02/08/2023. FINDINGS: Brain: Hypodensity associated with the posterior right MCA territory infarct has decreased compared to the prior CT with cortex currently only being subtly hypodense likely reflecting fogging phenomenon. No new infarct, acute hemorrhage, mass, or midline shift is evident. Small chronic left parieto-occipital cortical infarcts are again noted, and chronic small vessel ischemia is again noted involving the cerebral white matter bilaterally. Prominent extra-axial CSF over both cerebral convexities is unchanged and attributed to atrophy. Prominent extra-axial CSF in the posterior fossa overlying the left greater than right cerebellar hemispheres is also unchanged. Vascular: Calcified atherosclerosis at the skull base. No hyperdense vessel. Skull: No acute fracture or suspicious osseous lesion. Sinuses/Orbits: Paranasal sinuses and mastoid air cells are clear. Unremarkable orbits. Other: None. IMPRESSION: 1. No evidence of acute intracranial abnormality. 2. Interval evolution of a subacute posterior right MCA territory infarct. 3. Chronic small vessel ischemia. Electronically Signed   By: Sebastian Ache M.D.   On: 02/19/2023 18:02    Procedures .Critical Care  Performed by: Teressa Lower, PA-C Authorized by: Teressa Lower, PA-C   Critical care provider statement:    Critical care time (minutes):  35   Critical care was time spent personally by me on the following activities:  Development of treatment plan with patient  or surrogate, discussions with consultants, evaluation of patient's response to treatment, examination of patient, ordering and review of laboratory studies, ordering and review of radiographic studies, ordering and performing treatments and interventions, pulse oximetry, re-evaluation of patient's condition and review of old charts   Care discussed with: admitting provider       Medications Ordered in ED Medications  piperacillin-tazobactam (ZOSYN) IVPB 3.375 g (has no administration in time range)  ondansetron (ZOFRAN) injection 4 mg (4 mg Intravenous Given 02/19/23 1425)  fentaNYL (SUBLIMAZE) injection 50 mcg (50 mcg Intravenous Given 02/19/23 1502)  sodium chloride 0.9 % bolus 1,000 mL (0 mLs Intravenous Stopped 02/19/23 1630)  piperacillin-tazobactam (ZOSYN) IVPB 3.375 g (0 g Intravenous Stopped 02/19/23 1630)  iohexol (OMNIPAQUE) 300 MG/ML solution 75 mL (75 mLs Intravenous Contrast Given 02/19/23 1606)  ondansetron (ZOFRAN) injection 4 mg (4 mg Intravenous Given 02/19/23 1804)    ED Course/ Medical Decision Making/ A&P Clinical Course as of 02/19/23 1905  Thu Feb 19, 2023  1439 CBC with Differential(!) Significant leukocytosis.  There is some evidence of moderate anemia which seems to be downtrending in comparison to the patient's baseline 11 days ago. [CF]  1504 Patient had a distended bladder with 2200cc of urine. Foley catheter was placed.  [CF]  1505 I-stat chem 8, ED (not at [facility], [facility] or [facility])(!) Mild hypochloremia. Slight elevation in baseline creatinine.  [CF]  1516 SARS Coronavirus 2 by RT PCR (hospital order, performed in [facility] hospital lab) *cepheid single result test* Anterior Nasal Swab Normal.  [CF]  1525 Urinalysis, Routine w reflex microscopic -Urine, Clean Catch(!) Appears to have a urinary tract infection. [CF]  1540 Lipase, blood(!) Slightly elevated.  [CF]  1810 I spoke with Dr. Robyne Peers with general surgery who recommends NG tube placement, NPO, fluids, and  hospitalist admission. [CF]  1812 CT ABDOMEN PELVIS W CONTRAST I personally ordered interpreted the study and I do see evidence of a small bowel obstruction.  I do agree with the radiologist interpretation. [CF]  1813 CT Head Wo Contrast Personally ordered interpreted the study and do not see any evidence of intracranial hemorrhage.  I do agree with the radiologist interpretations. [CF]  1900 I spoke wit Dr. Mariea Clonts with [facility] hospitalist who agrees to admit the patient.  [CF]    Clinical Course User Index [CF] Teressa Lower, PA-C   {   Click here for ABCD2, HEART and other calculators  Medical Decision Making [name] is a 77 y.o. female patient who presents to the emergency department today for further evaluation of nausea, vomiting, and abdominal pain.  Given the patient's history of small bowel obstruction and her presentation today I am concerned for small bowel obstruction.  Patient does have large amount of dried bilious vomit on her shirt.  Abdomen is distended and diffusely tender.  Vital signs are stable at this time we will plan to give her some fluids, get a CT scan to further assess for small bowel obstruction, and basic labs.  Patient does have evidence of a small bowel obstruction.  I spoke with general surgery who will consult on the patient.  We will place an NG tube here.  Given the patient's leukocytosis, we will admit.  Zosyn was given early on concern and the patient was having abdominal pain and had a leukocytosis of 30.  Patient found to have a urinary tract infection which should be covered with Zosyn.  Will plan to admit to the hospitalist service for further management.  Amount and/or Complexity of Data Reviewed Labs: ordered. Decision-making details documented in ED Course. Radiology: ordered. Decision-making details documented in ED Course.  Risk Prescription drug management. Decision regarding hospitalization.    Final Clinical Impression(s) / ED  Diagnoses Final diagnoses:  Small bowel obstruction (HCC)  Acute cystitis with hematuria  Leukocytosis, unspecified type    Rx / DC Orders ED Discharge Orders     None         Teressa Lower, [place] 02/19/23 1905  Eber Hong, MD 02/22/23 1230

## 2023-02-19 NOTE — Assessment & Plan Note (Addendum)
UA consistent with UTI with large leukocytes.  Meeting sepsis criteria with tachycardia heart rate -111-127, and leukocytosis of 29.2.  No recent urine cultures -Obtain lactic acid -Obtain blood cultures -IV Zosyn started in ED, continue -Add on urine cultures

## 2023-02-19 NOTE — ED Provider Notes (Signed)
Patient being admitted for small bowel obstruction, the routine admitting ECG brought to me showing concern for inferolateral STEMI.  Patient is endorsing some upper abdominal pain, CT shows obstruction with transition point in the lower abdomen.  No troponin ordered.  I have ordered rectal aspirin.  I have discussed case with Dr. Eldridge Dace, on-call for STEMI, who has reviewed the ECGs and is concerned that there are no reciprocal changes and that this may actually represent pericarditis.  In any case, she needs to be admitted at [facility].  He recommends holding off on heparin for now.  I have reviewed he CT scan and see no evidence of pericardial effusion.  She is being sent to [facility] ED to ED for cardiology fellow to evaluate the patient with bedside echocardiogram.  I have also ordered a troponin level.  Dr. Bebe Shaggy at [facility] emergency department is accepting.  ED ECG REPORT   Date: 02/19/2023  Rate: 107  Rhythm: sinus tachycardia and premature atrial contractions (PAC)  QRS Axis: normal  Intervals: normal  ST/T Wave abnormalities: ST elevations inferiorly and ST elevation anterolaterally  Conduction Disutrbances:none  Narrative Interpretation: ST elevation in inferior and anterolateral leads concerning for STEMI versus pericarditis, no reciprocal ST depressions seen.  When compared with ECG of 02/09/2023, ST elevations are new.  Old EKG Reviewed: changes noted  I have personally reviewed the EKG tracing and agree with the computerized printout as noted.  CRITICAL CARE Performed by: Dione Booze Total critical care time: 50 minutes Critical care time was exclusive of separately billable procedures and treating other patients. Critical care was necessary to treat or prevent imminent or life-threatening deterioration. Critical care was time spent personally by me on the following activities: development of treatment plan with patient and/or surrogate as well  as nursing, discussions with consultants, evaluation of patient's response to treatment, examination of patient, obtaining history from patient or surrogate, ordering and performing treatments and interventions, ordering and review of laboratory studies, ordering and review of radiographic studies, pulse oximetry and re-evaluation of patient's condition.     Dione Booze, MD 02/19/23 2351

## 2023-02-19 NOTE — ED Notes (Signed)
Pt wanted 02 off. 02 taken off.

## 2023-02-19 NOTE — Progress Notes (Signed)
Pharmacy Antibiotic Note  [name] is a 77 y.o. female admitted on 02/19/2023 with  intra-abdominal infection .  Pharmacy has been consulted for Zosyn dosing.  Plan: Zosyn 3.375g IV q8h (4 hour infusion).  Height: 5\' 5"  (165.1 cm) Weight: 54.6 kg (120 lb 5.9 oz) IBW/kg (Calculated) : 57  Temp (24hrs), Avg:98.2 F (36.8 C), Min:98.2 F (36.8 C), Max:98.2 F (36.8 C)  Recent Labs  Lab 02/13/23 1032 02/19/23 1415 02/19/23 1424  WBC  --  29.2*  --   CREATININE 0.94  --  1.50*    Estimated Creatinine Clearance: 26.6 mL/min (A) (by C-G formula based on SCr of 1.5 mg/dL (H)).    Allergies  Allergen Reactions   Flagyl [Metronidazole Hcl] Other (See Comments)    "makes me feel whoozy and weird"   Other     Unknown antibiotic and unknown pain medications     Antimicrobials this admission: Zosyn 9/5 >>  Microbiology results: None pending  Thank you for allowing pharmacy to be a part of this patient's care.  Judeth Cornfield, PharmD Clinical Pharmacist 02/19/2023 2:51 PM

## 2023-02-19 NOTE — Assessment & Plan Note (Addendum)
Stable. -Hold metoprolol while n.p.o.

## 2023-02-19 NOTE — Assessment & Plan Note (Signed)
Diagnosed with acute stroke during recent hospitalization 01/2023. -Hold aspirin and Plavix while n.p.o.

## 2023-02-19 NOTE — ED Triage Notes (Signed)
Pt sent by [facility] for n/v times 3 days. Pt has had multiple episodes of vomiting this morning with greenish bile color. Pt states headache and back pain.

## 2023-02-19 NOTE — H&P (Addendum)
History and Physical    [name]:[id number] DOB: 10-Oct-1945 DOA: 02/19/2023  PCP: Assunta Found, MD   Patient coming from: [facility]  I have personally briefly reviewed patient's old medical records in [facility] Link  Chief Complaint: Abdominal Pain  HPI: [name] is a 77 y.o. female with medical history significant for GI bleed, hypertension, Crohn's, stroke, small bowel obstruction. Patient was brought to the ED from nursing home with reports of abdominal pain, distention with nausea and vomiting over the past 3 days.  Patient also reported headache, and back pain. At the time of my evaluation, patient is nauseous, feels like she is going to vomit, uncomfortable, barely answers any questions and not cooperative with exam.  Just recently admitted 8/24 to 8/30 for subacute infarct, with right ICA stenosis, deemed poor candidate for endarterectomy also AKI, severe hyponatremia sodium down to 115.  Initially thought hypovolemic in etiology, with component of SIADH.  Started on fluid restriction, salt tabs and urea tablets.  Urinary retention, passed voiding trial on discharge.  ED Course: Tmax 98.2.  Tachycardic heart rate 111-127.  Respiratory rate 14-23.  Blood pressure systolic 116-148.  O2 sats greater than 93% on room air, received a dose of 50 mg fentanyl and O2 sats dropped to 87%, she was placed on 2 L. Significant leukocytosis of 29.2. Potassium 3.4. Mild elevation in creatinine 1.38. 1 L bolus given. UA suggesting UTI- IV zosyn started. Also urinary retention, trend and 2200 cc of urine in the bladder, Foley inserted. CT abdomen and pelvis shows small bowel obstruction with transition point in the lower posterior abdomen CT negative for acute abnormality. EDP talked to general surgery- Dr. Robyne Peers, recommend NG tube, will see in consult in a.m.  Review of Systems: As per HPI all other systems reviewed and negative.  Past Medical History:  Diagnosis Date    Anemia, unspecified    Arthritis    Crohn's disease (HCC)    Degenerative disc disease, lumbar    Hypertension    Sciatica of right side    Stroke [facility])    Ventral hernia     Past Surgical History:  Procedure Laterality Date   CAROTID ENDARTERECTOMY     2004   CHOLECYSTECTOMY     2008   ESOPHAGOGASTRODUODENOSCOPY N/A 02/24/2015   Procedure: ESOPHAGOGASTRODUODENOSCOPY (EGD);  Surgeon: Corbin Ade, MD;  Location: [facility];  Service: Endoscopy;  Laterality: N/A;   HEMICOLECTOMY     right   HIP ARTHROPLASTY Left 04/09/2015   Procedure: INSERT PARTIAL HIP REPLACEMENT LEFT;  Surgeon: Vickki Hearing, MD;  Location: [facility] ORS;  Service: Orthopedics;  Laterality: Left;   INCISIONAL HERNIA REPAIR N/A 05/20/2013   Procedure: Sherald Hess HERNIORRHAPHY WITH MESH;  Surgeon: Dalia Heading, MD;  Location: [facility] ORS;  Service: General;  Laterality: N/A;   INCISIONAL HERNIA REPAIR N/A 05/19/2014   Procedure: RECURRENT HERNIA REPAIR INCISIONAL;  Surgeon: Dalia Heading, MD;  Location: [facility] ORS;  Service: General;  Laterality: N/A;   INSERTION OF MESH N/A 05/20/2013   Procedure: INSERTION OF MESH;  Surgeon: Dalia Heading, MD;  Location: [facility] ORS;  Service: General;  Laterality: N/A;   INSERTION OF MESH N/A 05/19/2014   Procedure: INSERTION OF MESH;  Surgeon: Dalia Heading, MD;  Location: [facility] ORS;  Service: General;  Laterality: N/A;   TUBAL LIGATION       reports that she has been smoking cigarettes. She has a 45 pack-year smoking history. She has  never used smokeless tobacco. She reports that she does not drink alcohol and does not use drugs.  Allergies  Allergen Reactions   Flagyl [Metronidazole Hcl] Other (See Comments)    "makes me feel whoozy and weird"   Other     Unknown antibiotic and unknown pain medications     Family History  Problem Relation Age of Onset   GI problems Father    Stroke Brother    Heart attack Mother    Hypotension Sister     Prior to Admission medications    Medication Sig Start Date End Date Taking? Authorizing Provider  acetaminophen (TYLENOL) 500 MG tablet Take 1 tablet (500 mg total) by mouth every 6 (six) hours as needed for mild pain. 04/12/15   Standley Brooking, MD  aspirin EC 81 MG tablet Take 1 tablet (81 mg total) by mouth daily. Resume in 1 week Patient taking differently: Take 81 mg by mouth daily. 02/25/15   Erick Blinks, MD  clopidogrel (PLAVIX) 75 MG tablet Take 1 tablet (75 mg total) by mouth daily. 02/14/23   Rhetta Mura, MD  HYDROcodone-acetaminophen (NORCO/VICODIN) 5-325 MG tablet Take 1 tablet by mouth every 6 (six) hours as needed for moderate pain. 02/13/23   Rhetta Mura, MD  methocarbamol (ROBAXIN) 500 MG tablet Take 1 tablet (500 mg total) by mouth every 8 (eight) hours as needed for muscle spasms. 02/13/23   Rhetta Mura, MD  metoprolol succinate (TOPROL XL) 25 MG 24 hr tablet Take 0.5 tablets (12.5 mg total) by mouth daily. 02/13/23 02/13/24  Rhetta Mura, MD  nicotine (NICODERM CQ - DOSED IN MG/24 HOURS) 21 mg/24hr patch Place 1 patch (21 mg total) onto the skin daily. 02/14/23   Rhetta Mura, MD  sodium bicarbonate 650 MG tablet Take 1 tablet (650 mg total) by mouth 2 (two) times daily. 02/13/23   Rhetta Mura, MD  sulfaSALAzine (AZULFIDINE) 500 MG tablet TAKE (2) TABLETS BY MOUTH TWICE DAILY. 11/13/22   Carlan, Chelsea L, NP  urea (URE-NA) 15 g PACK oral packet Take 15 g by mouth 2 (two) times daily. 02/13/23   Rhetta Mura, MD  witch hazel-glycerin (TUCKS) pad Apply topically as needed for itching. 02/13/23   Rhetta Mura, MD    Physical Exam: Limited exam due to patient's poor cooperation Vitals:   02/19/23 1800 02/19/23 1808 02/19/23 1814 02/19/23 1830  BP: 126/66   120/75  Pulse: (!) 111 (!) 125 (!) 121 (!) 120  Resp: 15 20 19 17   Temp:      TempSrc:      SpO2: 99% 93% 92% 99%  Weight:      Height:        Constitutional: NAD, calm,  comfortable Vitals:   02/19/23 1800 02/19/23 1808 02/19/23 1814 02/19/23 1830  BP: 126/66   120/75  Pulse: (!) 111 (!) 125 (!) 121 (!) 120  Resp: 15 20 19 17   Temp:      TempSrc:      SpO2: 99% 93% 92% 99%  Weight:      Height:       Eyes: PERRL, lids and conjunctivae normal ENMT: Mucous membranes are dry.   Neck: normal, supple, no masses, no thyromegaly Respiratory: clear to auscultation bilaterally, no wheezing, no crackles. Normal respiratory effort. No accessory muscle use.  Cardiovascular: Regular rate and rhythm, no murmurs / rubs / gallops. No extremity edema. 2+ pedal pulses. No carotid bruits.  Abdomen: no tenderness, no masses palpated. No hepatosplenomegaly. Bowel sounds positive.  Musculoskeletal: no clubbing / cyanosis. No joint deformity upper and lower extremities.  Skin: no rashes, lesions, ulcers. No induration Neurologic: Exam limited, patient not cooperative, she appears uncomfortable, speech fluent.  Psychiatric: Able to fully assess.  Patient not cooperative  Labs on Admission: I have personally reviewed following labs and imaging studies  CBC: Recent Labs  Lab 02/19/23 1415 02/19/23 1424  WBC 29.2*  --   NEUTROABS 26.8*  --   HGB 9.5* 10.2*  HCT 28.1* 30.0*  MCV 102.9*  --   PLT 343  --    Basic Metabolic Panel: Recent Labs  Lab 02/13/23 1032 02/19/23 1415 02/19/23 1424  NA 131* 136 135  K 3.7 3.4* 3.6  CL 96* 93* 96*  CO2 21* 22  --   GLUCOSE 110* 136* 133*  BUN 31* 51* 47*  CREATININE 0.94 1.38* 1.50*  CALCIUM 8.5* 9.6  --    GFR: Estimated Creatinine Clearance: 26.6 mL/min (A) (by C-G formula based on SCr of 1.5 mg/dL (H)). Liver Function Tests: Recent Labs  Lab 02/19/23 1415  AST 29  ALT 23  ALKPHOS 49  BILITOT 1.0  PROT 6.2*  ALBUMIN 3.4*   Recent Labs  Lab 02/19/23 1415  LIPASE 64*   Urine analysis:    Component Value Date/Time   COLORURINE YELLOW 02/19/2023 1449   APPEARANCEUR CLOUDY (A) 02/19/2023 1449   LABSPEC  1.014 02/19/2023 1449   PHURINE 8.0 02/19/2023 1449   GLUCOSEU NEGATIVE 02/19/2023 1449   HGBUR MODERATE (A) 02/19/2023 1449   BILIRUBINUR NEGATIVE 02/19/2023 1449   KETONESUR NEGATIVE 02/19/2023 1449   PROTEINUR 30 (A) 02/19/2023 1449   UROBILINOGEN 0.2 04/09/2015 0330   NITRITE NEGATIVE 02/19/2023 1449   LEUKOCYTESUR LARGE (A) 02/19/2023 1449    Radiological Exams on Admission: CT ABDOMEN PELVIS W CONTRAST  Result Date: 02/19/2023 CLINICAL DATA:  Nausea and vomiting for 3 days.  Abdominal pain. EXAM: CT ABDOMEN AND PELVIS WITH CONTRAST TECHNIQUE: Multidetector CT imaging of the abdomen and pelvis was performed using the standard protocol following bolus administration of intravenous contrast. RADIATION DOSE REDUCTION: This exam was performed according to the departmental dose-optimization program which includes automated exposure control, adjustment of the mA and/or kV according to patient size and/or use of iterative reconstruction technique. CONTRAST:  75mL OMNIPAQUE IOHEXOL 300 MG/ML  SOLN COMPARISON:  07/07/2015 FINDINGS: Lower chest: No acute abnormality. Hepatobiliary: Respiratory motion mildly degrades detail in the upper abdomen. Unremarkable liver. Cholecystectomy. Prominent bile duct likely due to reservoir effect. Pancreas: Coarse calcifications in the pancreatic head due to sequela of chronic pancreatitis. No acute pancreatitis. No ductal dilation. Spleen: Unremarkable. Adrenals/Urinary Tract: Stable adrenal glands. No urinary calculi or hydronephrosis. Nondistended bladder about a Foley catheter. Stomach/Bowel: Stomach is within normal limits. Postoperative changes with neoterminal ileum. Marked dilation of the small bowel with air-fluid levels and transition point in the low posterior abdomen (series 2/image 50). Decompressed colon. Colonic diverticulosis without diverticulitis. Duodenal diverticulum. Vascular/Lymphatic: Aortic atherosclerosis. No enlarged abdominal or pelvic lymph  nodes. Reproductive: Uterus and bilateral adnexa are unremarkable. Other: No free intraperitoneal fluid or air. 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 Musculoskeletal: Left THA.  No acute fracture. IMPRESSION: 1. Small-bowel obstruction with transition point in the low posterior abdomen. Aortic Atherosclerosis (ICD10-I70.0). Electronically Signed   By: Minerva Fester M.D.   On: 02/19/2023 18:04   CT Head Wo Contrast  Result Date: 02/19/2023 CLINICAL DATA:  Headache, sudden, severe.  Nausea and vomiting. EXAM: CT HEAD WITHOUT CONTRAST TECHNIQUE: Contiguous axial  images were obtained from the base of the skull through the vertex without intravenous contrast. RADIATION DOSE REDUCTION: This exam was performed according to the departmental dose-optimization program which includes automated exposure control, adjustment of the mA and/or kV according to patient size and/or use of iterative reconstruction technique. COMPARISON:  CTA head and neck 02/09/2023.  MRI head 02/08/2023. FINDINGS: Brain: Hypodensity associated with the posterior right MCA territory infarct has decreased compared to the prior CT with cortex currently only being subtly hypodense likely reflecting fogging phenomenon. No new infarct, acute hemorrhage, mass, or midline shift is evident. Small chronic left parieto-occipital cortical infarcts are again noted, and chronic small vessel ischemia is again noted involving the cerebral white matter bilaterally. Prominent extra-axial CSF over both cerebral convexities is unchanged and attributed to atrophy. Prominent extra-axial CSF in the posterior fossa overlying the left greater than right cerebellar hemispheres is also unchanged. Vascular: Calcified atherosclerosis at the skull base. No hyperdense vessel. Skull: No acute fracture or suspicious osseous lesion. Sinuses/Orbits: Paranasal sinuses and mastoid air cells are clear. Unremarkable orbits. Other: None. IMPRESSION: 1. No evidence of  acute intracranial abnormality. 2. Interval evolution of a subacute posterior right MCA territory infarct. 3. Chronic small vessel ischemia. Electronically Signed   By: Sebastian Ache M.D.   On: 02/19/2023 18:02    EKG: Pending   Assessment/Plan Principal Problem:   Small bowel obstruction (HCC) Active Problems:   Sepsis secondary to UTI [facility])   Urinary retention   Crohn's disease (HCC)   Upper GI bleed   HTN (hypertension)   Stroke (cerebrum) (HCC)   AKI (acute kidney injury) (HCC)  Assessment and Plan: * Small bowel obstruction (HCC) Presents with abdominal pain distention nausea and vomiting. CT abdomen and pelvis with contrast small-bowel obstruction with transition point in the low posterior abdomen.  Per med list from nursing home at bedside -patient got Plavix yesterday, and it appears she got it today. - EDP talked to Dr. Robyne Peers, request NG tube, will see in consult - N.p.o. - 2 L bolus continue, D5+N/s+20 KCL 75cc/hr -IV morphine 2 mg every 4 hourly as needed -Hold Plavix pending general surgery evaluation and NPO status - CBG q8h while NPO  Sepsis secondary to UTI (HCC) UA consistent with UTI with large leukocytes.  Meeting sepsis criteria with tachycardia heart rate -111-127, and leukocytosis of 29.2.  No recent urine cultures -Obtain lactic acid -Obtain blood cultures -IV Zosyn started in ED, continue -Add on urine cultures  Crohn's disease (HCC) Hold sulfasalazine while NPO.  Upper GI bleed History of upper GI bleed.  Hemoglobin 9.5 today, baseline 11-13.  At this time no evidence of GI blood loss.  On Plavix.   -Trend hemoglobin for now  AKI (acute kidney injury) (HCC) Mild AKI, creatinine 1.38, baseline 0.7-1.  In the setting of sepsis.  Stroke (cerebrum) (HCC) Diagnosed with acute stroke during recent hospitalization 01/2023. -Hold aspirin and Plavix while n.p.o.  HTN (hypertension) Stable. -Hold metoprolol while n.p.o.   DVT prophylaxis:  SCDS Code Status: FULL-documentation from nursing home at bedside states full code Family Communication: None at bedside Disposition Plan:  > 2 days Consults called: Gen Surg Admission status: Inpt Stepdown I certify that at the point of admission it is my clinical judgment that the patient will require inpatient hospital care spanning beyond 2 midnights from the point of admission due to high intensity of service, high risk for further deterioration and high frequency of surveillance required.     Author: Hadley Pen  Wendall Stade, MD 02/19/2023 9:28 PM  For on call review [email or web address].

## 2023-02-20 ENCOUNTER — Observation Stay (HOSPITAL_COMMUNITY): Payer: Medicare Other

## 2023-02-20 ENCOUNTER — Observation Stay (HOSPITAL_BASED_OUTPATIENT_CLINIC_OR_DEPARTMENT_OTHER): Payer: Medicare Other

## 2023-02-20 ENCOUNTER — Inpatient Hospital Stay (HOSPITAL_COMMUNITY): Payer: Medicare Other

## 2023-02-20 DIAGNOSIS — R6520 Severe sepsis without septic shock: Secondary | ICD-10-CM | POA: Diagnosis not present

## 2023-02-20 DIAGNOSIS — Z66 Do not resuscitate: Secondary | ICD-10-CM | POA: Diagnosis not present

## 2023-02-20 DIAGNOSIS — M6281 Muscle weakness (generalized): Secondary | ICD-10-CM | POA: Diagnosis not present

## 2023-02-20 DIAGNOSIS — K56609 Unspecified intestinal obstruction, unspecified as to partial versus complete obstruction: Secondary | ICD-10-CM | POA: Diagnosis not present

## 2023-02-20 DIAGNOSIS — R6889 Other general symptoms and signs: Secondary | ICD-10-CM | POA: Diagnosis not present

## 2023-02-20 DIAGNOSIS — A419 Sepsis, unspecified organism: Secondary | ICD-10-CM | POA: Diagnosis not present

## 2023-02-20 DIAGNOSIS — R279 Unspecified lack of coordination: Secondary | ICD-10-CM | POA: Diagnosis not present

## 2023-02-20 DIAGNOSIS — R9431 Abnormal electrocardiogram [ECG] [EKG]: Secondary | ICD-10-CM | POA: Diagnosis not present

## 2023-02-20 DIAGNOSIS — I2489 Other forms of acute ischemic heart disease: Secondary | ICD-10-CM

## 2023-02-20 DIAGNOSIS — I129 Hypertensive chronic kidney disease with stage 1 through stage 4 chronic kidney disease, or unspecified chronic kidney disease: Secondary | ICD-10-CM | POA: Diagnosis present

## 2023-02-20 DIAGNOSIS — D649 Anemia, unspecified: Secondary | ICD-10-CM | POA: Diagnosis not present

## 2023-02-20 DIAGNOSIS — F17210 Nicotine dependence, cigarettes, uncomplicated: Secondary | ICD-10-CM | POA: Diagnosis not present

## 2023-02-20 DIAGNOSIS — E876 Hypokalemia: Secondary | ICD-10-CM | POA: Diagnosis not present

## 2023-02-20 DIAGNOSIS — Z4682 Encounter for fitting and adjustment of non-vascular catheter: Secondary | ICD-10-CM | POA: Diagnosis not present

## 2023-02-20 DIAGNOSIS — N189 Chronic kidney disease, unspecified: Secondary | ICD-10-CM | POA: Diagnosis present

## 2023-02-20 DIAGNOSIS — R339 Retention of urine, unspecified: Secondary | ICD-10-CM | POA: Diagnosis not present

## 2023-02-20 DIAGNOSIS — Z1152 Encounter for screening for COVID-19: Secondary | ICD-10-CM | POA: Diagnosis not present

## 2023-02-20 DIAGNOSIS — B49 Unspecified mycosis: Secondary | ICD-10-CM | POA: Diagnosis not present

## 2023-02-20 DIAGNOSIS — R1311 Dysphagia, oral phase: Secondary | ICD-10-CM | POA: Diagnosis not present

## 2023-02-20 DIAGNOSIS — R079 Chest pain, unspecified: Secondary | ICD-10-CM | POA: Diagnosis not present

## 2023-02-20 DIAGNOSIS — Z7189 Other specified counseling: Secondary | ICD-10-CM | POA: Diagnosis not present

## 2023-02-20 DIAGNOSIS — I69354 Hemiplegia and hemiparesis following cerebral infarction affecting left non-dominant side: Secondary | ICD-10-CM | POA: Diagnosis not present

## 2023-02-20 DIAGNOSIS — N390 Urinary tract infection, site not specified: Secondary | ICD-10-CM | POA: Diagnosis present

## 2023-02-20 DIAGNOSIS — Z96642 Presence of left artificial hip joint: Secondary | ICD-10-CM | POA: Diagnosis not present

## 2023-02-20 DIAGNOSIS — K5000 Crohn's disease of small intestine without complications: Secondary | ICD-10-CM | POA: Diagnosis not present

## 2023-02-20 DIAGNOSIS — K50911 Crohn's disease, unspecified, with rectal bleeding: Secondary | ICD-10-CM | POA: Diagnosis present

## 2023-02-20 DIAGNOSIS — K5669 Other partial intestinal obstruction: Secondary | ICD-10-CM | POA: Diagnosis not present

## 2023-02-20 DIAGNOSIS — K5651 Intestinal adhesions [bands], with partial obstruction: Secondary | ICD-10-CM | POA: Diagnosis not present

## 2023-02-20 DIAGNOSIS — E860 Dehydration: Secondary | ICD-10-CM | POA: Diagnosis present

## 2023-02-20 DIAGNOSIS — I7100 Dissection of unspecified site of aorta: Secondary | ICD-10-CM | POA: Diagnosis present

## 2023-02-20 DIAGNOSIS — A4151 Sepsis due to Escherichia coli [E. coli]: Secondary | ICD-10-CM | POA: Diagnosis present

## 2023-02-20 DIAGNOSIS — D72829 Elevated white blood cell count, unspecified: Secondary | ICD-10-CM | POA: Diagnosis not present

## 2023-02-20 DIAGNOSIS — N179 Acute kidney failure, unspecified: Secondary | ICD-10-CM | POA: Diagnosis present

## 2023-02-20 DIAGNOSIS — R1110 Vomiting, unspecified: Secondary | ICD-10-CM | POA: Diagnosis not present

## 2023-02-20 DIAGNOSIS — Z681 Body mass index (BMI) 19 or less, adult: Secondary | ICD-10-CM | POA: Diagnosis not present

## 2023-02-20 DIAGNOSIS — Z8249 Family history of ischemic heart disease and other diseases of the circulatory system: Secondary | ICD-10-CM | POA: Diagnosis not present

## 2023-02-20 DIAGNOSIS — R609 Edema, unspecified: Secondary | ICD-10-CM | POA: Diagnosis not present

## 2023-02-20 DIAGNOSIS — N3001 Acute cystitis with hematuria: Secondary | ICD-10-CM | POA: Diagnosis not present

## 2023-02-20 DIAGNOSIS — Z515 Encounter for palliative care: Secondary | ICD-10-CM | POA: Diagnosis not present

## 2023-02-20 DIAGNOSIS — Z7401 Bed confinement status: Secondary | ICD-10-CM | POA: Diagnosis not present

## 2023-02-20 DIAGNOSIS — I6521 Occlusion and stenosis of right carotid artery: Secondary | ICD-10-CM | POA: Diagnosis present

## 2023-02-20 DIAGNOSIS — B377 Candidal sepsis: Secondary | ICD-10-CM | POA: Diagnosis not present

## 2023-02-20 DIAGNOSIS — I639 Cerebral infarction, unspecified: Secondary | ICD-10-CM | POA: Diagnosis not present

## 2023-02-20 DIAGNOSIS — Z9181 History of falling: Secondary | ICD-10-CM | POA: Diagnosis not present

## 2023-02-20 DIAGNOSIS — K922 Gastrointestinal hemorrhage, unspecified: Secondary | ICD-10-CM | POA: Diagnosis not present

## 2023-02-20 LAB — ECHOCARDIOGRAM COMPLETE
AR max vel: 1.71 cm2
AV Area VTI: 1.74 cm2
AV Area mean vel: 1.67 cm2
AV Mean grad: 9 mmHg
AV Peak grad: 16.2 mmHg
Ao pk vel: 2.01 m/s
Area-P 1/2: 2.85 cm2
Height: 65 in
MV VTI: 2.55 cm2
S' Lateral: 2.2 cm
Weight: 1925.94 [oz_av]

## 2023-02-20 LAB — CBC
HCT: 23.9 % — ABNORMAL LOW (ref 36.0–46.0)
Hemoglobin: 7.6 g/dL — ABNORMAL LOW (ref 12.0–15.0)
MCH: 34.4 pg — ABNORMAL HIGH (ref 26.0–34.0)
MCHC: 31.8 g/dL (ref 30.0–36.0)
MCV: 108.1 fL — ABNORMAL HIGH (ref 80.0–100.0)
Platelets: 220 10*3/uL (ref 150–400)
RBC: 2.21 MIL/uL — ABNORMAL LOW (ref 3.87–5.11)
RDW: 13.7 % (ref 11.5–15.5)
WBC: 20.2 10*3/uL — ABNORMAL HIGH (ref 4.0–10.5)
nRBC: 0 % (ref 0.0–0.2)

## 2023-02-20 LAB — I-STAT CHEM 8, ED
BUN: 39 mg/dL — ABNORMAL HIGH (ref 8–23)
Calcium, Ion: 1.16 mmol/L (ref 1.15–1.40)
Chloride: 103 mmol/L (ref 98–111)
Creatinine, Ser: 1.1 mg/dL — ABNORMAL HIGH (ref 0.44–1.00)
Glucose, Bld: 109 mg/dL — ABNORMAL HIGH (ref 70–99)
HCT: 22 % — ABNORMAL LOW (ref 36.0–46.0)
Hemoglobin: 7.5 g/dL — ABNORMAL LOW (ref 12.0–15.0)
Potassium: 2.7 mmol/L — CL (ref 3.5–5.1)
Sodium: 141 mmol/L (ref 135–145)
TCO2: 25 mmol/L (ref 22–32)

## 2023-02-20 LAB — BASIC METABOLIC PANEL
Anion gap: 11 (ref 5–15)
BUN: 36 mg/dL — ABNORMAL HIGH (ref 8–23)
CO2: 24 mmol/L (ref 22–32)
Calcium: 8.5 mg/dL — ABNORMAL LOW (ref 8.9–10.3)
Chloride: 106 mmol/L (ref 98–111)
Creatinine, Ser: 1.02 mg/dL — ABNORMAL HIGH (ref 0.44–1.00)
GFR, Estimated: 56 mL/min — ABNORMAL LOW (ref >60)
Glucose, Bld: 126 mg/dL — ABNORMAL HIGH (ref 70–99)
Potassium: 2.6 mmol/L — CL (ref 3.5–5.1)
Sodium: 141 mmol/L (ref 135–145)

## 2023-02-20 LAB — TROPONIN I (HIGH SENSITIVITY): Troponin I (High Sensitivity): 127 ng/L (ref <18)

## 2023-02-20 LAB — GLUCOSE, CAPILLARY
Glucose-Capillary: 106 mg/dL — ABNORMAL HIGH (ref 70–99)
Glucose-Capillary: 108 mg/dL — ABNORMAL HIGH (ref 70–99)

## 2023-02-20 LAB — I-STAT CG4 LACTIC ACID, ED: Lactic Acid, Venous: 1.1 mmol/L (ref 0.5–1.9)

## 2023-02-20 LAB — CBG MONITORING, ED: Glucose-Capillary: 106 mg/dL — ABNORMAL HIGH (ref 70–99)

## 2023-02-20 MED ORDER — CHLORHEXIDINE GLUCONATE CLOTH 2 % EX PADS
6.0000 | MEDICATED_PAD | Freq: Once | CUTANEOUS | Status: AC
  Administered 2023-02-20: 6 via TOPICAL

## 2023-02-20 MED ORDER — MAGIC MOUTHWASH
5.0000 mL | Freq: Four times a day (QID) | ORAL | Status: DC | PRN
  Administered 2023-02-20: 5 mL via ORAL
  Filled 2023-02-20 (×2): qty 5

## 2023-02-20 MED ORDER — ONDANSETRON HCL 4 MG/2ML IJ SOLN
4.0000 mg | Freq: Four times a day (QID) | INTRAMUSCULAR | Status: DC | PRN
  Administered 2023-02-25: 4 mg via INTRAVENOUS
  Filled 2023-02-20 (×2): qty 2

## 2023-02-20 MED ORDER — SODIUM CHLORIDE 0.9 % IV SOLN: INTRAVENOUS | Status: DC | PRN

## 2023-02-20 MED ORDER — POTASSIUM CHLORIDE 10 MEQ/100ML IV SOLN
10.0000 meq | INTRAVENOUS | Status: AC
  Administered 2023-02-20 (×4): 10 meq via INTRAVENOUS
  Filled 2023-02-20 (×4): qty 100

## 2023-02-20 MED ORDER — ACETAMINOPHEN 325 MG PO TABS
650.0000 mg | ORAL_TABLET | Freq: Four times a day (QID) | ORAL | Status: DC | PRN
  Administered 2023-02-22 – 2023-02-26 (×3): 650 mg via ORAL
  Filled 2023-02-20 (×3): qty 2

## 2023-02-20 MED ORDER — ACETAMINOPHEN 650 MG RE SUPP: 650.0000 mg | Freq: Four times a day (QID) | RECTAL | Status: DC | PRN

## 2023-02-20 MED ORDER — DIATRIZOATE MEGLUMINE & SODIUM 66-10 % PO SOLN
90.0000 mL | Freq: Once | ORAL | Status: AC
  Administered 2023-02-20: 90 mL via NASOGASTRIC
  Filled 2023-02-20: qty 90

## 2023-02-20 MED ORDER — SODIUM CHLORIDE 0.9 % IV BOLUS
1000.0000 mL | Freq: Once | INTRAVENOUS | Status: AC
  Administered 2023-02-20: 1000 mL via INTRAVENOUS

## 2023-02-20 MED ORDER — ONDANSETRON HCL 4 MG PO TABS: 4.0000 mg | ORAL_TABLET | Freq: Four times a day (QID) | ORAL | Status: DC | PRN

## 2023-02-20 NOTE — Consult Note (Signed)
**Note Annette-Identified via Obfuscation** Reason for Consult:bowel obstruction Referring Provider: [name] is an 78 y.o. female.  HPI: 77 yo female recently discharged from hospital for acute stroke presents with vomiting. She was initially seen at [facility] but was transferred down to [facility] for initial concern of STEMI. She has been vomiting multiple times. She has intermittent right upper abdominal pain. She had an NG placed at [facility], but it has since been removed.  Past Medical History:  Diagnosis Date   Anemia, unspecified    Arthritis    Crohn's disease (HCC)    Degenerative disc disease, lumbar    Hypertension    Sciatica of right side    Stroke [facility])    Ventral hernia     Past Surgical History:  Procedure Laterality Date   CAROTID ENDARTERECTOMY     2004   CHOLECYSTECTOMY     2008   ESOPHAGOGASTRODUODENOSCOPY N/A 02/24/2015   Procedure: ESOPHAGOGASTRODUODENOSCOPY (EGD);  Surgeon: Corbin Ade, MD;  Location: [facility] ENDO SUITE;  Service: Endoscopy;  Laterality: N/A;   HEMICOLECTOMY     right   HIP ARTHROPLASTY Left 04/09/2015   Procedure: INSERT PARTIAL HIP REPLACEMENT LEFT;  Surgeon: Vickki Hearing, MD;  Location: [facility] ORS;  Service: Orthopedics;  Laterality: Left;   INCISIONAL HERNIA REPAIR N/A 05/20/2013   Procedure: Sherald Hess HERNIORRHAPHY WITH MESH;  Surgeon: Dalia Heading, MD;  Location: [facility] ORS;  Service: General;  Laterality: N/A;   INCISIONAL HERNIA REPAIR N/A 05/19/2014   Procedure: RECURRENT HERNIA REPAIR INCISIONAL;  Surgeon: Dalia Heading, MD;  Location: [facility] ORS;  Service: General;  Laterality: N/A;   INSERTION OF MESH N/A 05/20/2013   Procedure: INSERTION OF MESH;  Surgeon: Dalia Heading, MD;  Location: [facility] ORS;  Service: General;  Laterality: N/A;   INSERTION OF MESH N/A 05/19/2014   Procedure: INSERTION OF MESH;  Surgeon: Dalia Heading, MD;  Location: [facility] ORS;  Service: General;  Laterality: N/A;   TUBAL LIGATION      Family History  Problem Relation Age of Onset    GI problems Father    Stroke Brother    Heart attack Mother    Hypotension Sister     Social History:  reports that she has been smoking cigarettes. She has a 45 pack-year smoking history. She has never used smokeless tobacco. She reports that she does not drink alcohol and does not use drugs.  Allergies:  Allergies  Allergen Reactions   Flagyl [Metronidazole Hcl] Other (See Comments)    "makes me feel whoozy and weird"   Other     Unknown antibiotic and unknown pain medications     Medications: I have reviewed the patient's current medications.  Results for orders placed or performed during the hospital encounter of 02/19/23 (from the past 48 hour(s))  SARS Coronavirus 2 by RT PCR (hospital order, performed in [facility] hospital lab) *cepheid single result test* Anterior Nasal Swab     Status: None   Collection Time: 02/19/23  2:13 PM   Specimen: Anterior Nasal Swab  Result Value Ref Range   SARS Coronavirus 2 by RT PCR NEGATIVE NEGATIVE    Comment: (NOTE) SARS-CoV-2 target nucleic acids are NOT DETECTED.  The SARS-CoV-2 RNA is generally detectable in upper and lower respiratory specimens during the acute phase of infection. The lowest concentration of SARS-CoV-2 viral copies this assay can detect is 250 copies / mL. A negative result does not preclude SARS-CoV-2 infection and should not be  used as the sole basis for treatment or other patient management decisions.  A negative result may occur with improper specimen collection / handling, submission of specimen other than nasopharyngeal swab, presence of viral mutation(s) within the areas targeted by this assay, and inadequate number of viral copies (<250 copies / mL). A negative result must be combined with clinical observations, patient history, and epidemiological information.  Fact Sheet for Patients:   RoadLapTop.co.za  Fact Sheet for Healthcare  Providers: http://kim-miller.com/  This test is not yet approved or  cleared by the [place] FDA and has been authorized for detection and/or diagnosis of SARS-CoV-2 by FDA under an Emergency Use Authorization (EUA).  This EUA will remain in effect (meaning this test can be used) for the duration of the COVID-19 declaration under Section 564(b)(1) of the Act, 21 U.S.C. section 360bbb-3(b)(1), unless the authorization is terminated or revoked sooner.  Performed at [facility], [address], [place], [place] [address]   CBC with Differential     Status: Abnormal   Collection Time: 02/19/23  2:15 PM  Result Value Ref Range   WBC 29.2 (H) 4.0 - 10.5 K/uL   RBC 2.73 (L) 3.87 - 5.11 MIL/uL   Hemoglobin 9.5 (L) 12.0 - 15.0 g/dL   HCT 40.9 (L) 81.1 - 91.4 %   MCV 102.9 (H) 80.0 - 100.0 fL   MCH 34.8 (H) 26.0 - 34.0 pg   MCHC 33.8 30.0 - 36.0 g/dL   RDW 78.2 95.6 - 21.3 %   Platelets 343 150 - 400 K/uL   nRBC 0.0 0.0 - 0.2 %   Neutrophils Relative % 92 %   Neutro Abs 26.8 (H) 1.7 - 7.7 K/uL   Lymphocytes Relative 4 %   Lymphs Abs 1.1 0.7 - 4.0 K/uL   Monocytes Relative 3 %   Monocytes Absolute 1.0 0.1 - 1.0 K/uL   Eosinophils Relative 0 %   Eosinophils Absolute 0.0 0.0 - 0.5 K/uL   Basophils Relative 0 %   Basophils Absolute 0.1 0.0 - 0.1 K/uL   WBC Morphology MORPHOLOGY UNREMARKABLE    Smear Review MORPHOLOGY UNREMARKABLE    Immature Granulocytes 1 %   Abs Immature Granulocytes 0.16 (H) 0.00 - 0.07 K/uL   Polychromasia PRESENT     Comment: Performed at [facility], [address] [place], [place] [address]  Comprehensive metabolic panel     Status: Abnormal   Collection Time: 02/19/23  2:15 PM  Result Value Ref Range   Sodium 136 135 - 145 mmol/L   Potassium 3.4 (L) 3.5 - 5.1 mmol/L   Chloride 93 (L) 98 - 111 mmol/L   CO2 22 22 - 32 mmol/L   Glucose, Bld 136 (H) 70 - 99 mg/dL    Comment: Glucose reference range applies only to samples taken after  fasting for at least 8 hours.   BUN 51 (H) 8 - 23 mg/dL   Creatinine, Ser 8.46 (H) 0.44 - 1.00 mg/dL   Calcium 9.6 8.9 - 96.2 mg/dL   Total Protein 6.2 (L) 6.5 - 8.1 g/dL   Albumin 3.4 (L) 3.5 - 5.0 g/dL   AST 29 15 - 41 U/L   ALT 23 0 - 44 U/L   Alkaline Phosphatase 49 38 - 126 U/L   Total Bilirubin 1.0 0.3 - 1.2 mg/dL   GFR, Estimated 39 (L) >60 mL/min    Comment: (NOTE) Calculated using the CKD-EPI Creatinine Equation (2021)    Anion gap 21 (H) 5 - 15  Comment: Electrolytes repeated to confirm. Electrolytes repeated to confirm. Performed at [facility], [address] [place], [place] [address]   Lipase, blood     Status: Abnormal   Collection Time: 02/19/23  2:15 PM  Result Value Ref Range   Lipase 64 (H) 11 - 51 U/L    Comment: Performed at [facility], [address] [place], [place] [address]  I-stat chem 8, ED (not at [facility], [facility] or [facility])     Status: Abnormal   Collection Time: 02/19/23  2:24 PM  Result Value Ref Range   Sodium 135 135 - 145 mmol/L   Potassium 3.6 3.5 - 5.1 mmol/L   Chloride 96 (L) 98 - 111 mmol/L   BUN 47 (H) 8 - 23 mg/dL   Creatinine, Ser 0.86 (H) 0.44 - 1.00 mg/dL   Glucose, Bld 578 (H) 70 - 99 mg/dL    Comment: Glucose reference range applies only to samples taken after fasting for at least 8 hours.   Calcium, Ion 1.12 (L) 1.15 - 1.40 mmol/L   TCO2 29 22 - 32 mmol/L   Hemoglobin 10.2 (L) 12.0 - 15.0 g/dL   HCT 46.9 (L) 62.9 - 52.8 %  Urinalysis, Routine w reflex microscopic -Urine, Clean Catch     Status: Abnormal   Collection Time: 02/19/23  2:49 PM  Result Value Ref Range   Color, Urine YELLOW YELLOW   APPearance CLOUDY (A) CLEAR   Specific Gravity, Urine 1.014 1.005 - 1.030   pH 8.0 5.0 - 8.0   Glucose, UA NEGATIVE NEGATIVE mg/dL   Hgb urine dipstick MODERATE (A) NEGATIVE   Bilirubin Urine NEGATIVE NEGATIVE   Ketones, ur NEGATIVE NEGATIVE mg/dL   Protein, ur 30 (A) NEGATIVE mg/dL   Nitrite NEGATIVE NEGATIVE   Leukocytes,Ua LARGE (A)  NEGATIVE   RBC / HPF 0-5 0 - 5 RBC/hpf   WBC, UA >50 0 - 5 WBC/hpf   Bacteria, UA RARE (A) NONE SEEN   Squamous Epithelial / HPF 0-5 0 - 5 /HPF    Comment: Performed at [facility], [address] [place], [place] [address]  Culture, blood (Routine X 2) w Reflex to ID Panel     Status: None (Preliminary result)   Collection Time: 02/19/23  9:30 PM   Specimen: BLOOD  Result Value Ref Range   Specimen Description BLOOD BLOOD RIGHT HAND    Special Requests      BOTTLES DRAWN AEROBIC ONLY Blood Culture results may not be optimal due to an inadequate volume of blood received in culture bottles Performed at [facility], [address], [place], [place] [address]    Culture PENDING    Report Status PENDING   Lactic acid, plasma     Status: None   Collection Time: 02/19/23  9:39 PM  Result Value Ref Range   Lactic Acid, Venous 1.3 0.5 - 1.9 mmol/L    Comment: Performed at [facility], [address] [place], [place] [address]  CBG monitoring, ED     Status: Abnormal   Collection Time: 02/19/23 10:44 PM  Result Value Ref Range   Glucose-Capillary 109 (H) 70 - 99 mg/dL    Comment: Glucose reference range applies only to samples taken after fasting for at least 8 hours.  Culture, blood (Routine X 2) w Reflex to ID Panel     Status: None (Preliminary result)   Collection Time: 02/19/23 11:19 PM   Specimen: BLOOD  Result Value Ref Range   Specimen Description BLOOD  LEFT ANTECUBITAL    Special Requests      BOTTLES DRAWN AEROBIC AND ANAEROBIC Blood Culture results may not be optimal due to an excessive volume of blood received in culture bottles Performed at [facility], [address], [place], [place] [address]    Culture PENDING    Report Status PENDING   Troponin I (High Sensitivity)     Status: Abnormal   Collection Time: 02/19/23 11:19 PM  Result Value Ref Range   Troponin I (High Sensitivity) 127 (HH) <18 ng/L    Comment: CRITICAL RESULT CALLED TO, READ BACK BY AND VERIFIED WITH  BAILEYO., RN 02/20/23 0024 VIRAY,J (NOTE) Elevated high sensitivity troponin I (hsTnI) values and significant  changes across serial measurements may suggest ACS but many other  chronic and acute conditions are known to elevate hsTnI results.  Refer to the "Links" section for chest pain algorithms and additional  guidance. Performed at [facility], [address] [place], [place] [address]   CBG monitoring, ED     Status: Abnormal   Collection Time: 02/20/23  1:35 AM  Result Value Ref Range   Glucose-Capillary 106 (H) 70 - 99 mg/dL    Comment: Glucose reference range applies only to samples taken after fasting for at least 8 hours.  I-Stat CG4 Lactic Acid     Status: None   Collection Time: 02/20/23  1:49 AM  Result Value Ref Range   Lactic Acid, Venous 1.1 0.5 - 1.9 mmol/L  I-stat chem 8, ED (not at [facility], [facility] or [facility])     Status: Abnormal   Collection Time: 02/20/23  1:50 AM  Result Value Ref Range   Sodium 141 135 - 145 mmol/L   Potassium 2.7 (LL) 3.5 - 5.1 mmol/L   Chloride 103 98 - 111 mmol/L   BUN 39 (H) 8 - 23 mg/dL   Creatinine, Ser 9.56 (H) 0.44 - 1.00 mg/dL   Glucose, Bld 387 (H) 70 - 99 mg/dL    Comment: Glucose reference range applies only to samples taken after fasting for at least 8 hours.   Calcium, Ion 1.16 1.15 - 1.40 mmol/L   TCO2 25 22 - 32 mmol/L   Hemoglobin 7.5 (L) 12.0 - 15.0 g/dL   HCT 56.4 (L) 33.2 - 95.1 %   Comment NOTIFIED PHYSICIAN     DG Chest Portable 1 View  Result Date: 02/19/2023 CLINICAL DATA:  NG tube advancement. EXAM: PORTABLE CHEST 1 VIEW COMPARISON:  Radiographs 02/19/2023 at 9:39 p.m. FINDINGS: Similar position of the NG tube tip near the gastroesophageal junction and side-port in the distal esophagus. Recommend advancement approximately 10 cm to ensure side port is within the stomach. Remainder unchanged. IMPRESSION: Similar position of the NG tube in the distal esophagus. Recommend advancement approximately 10 cm to ensure side port is  within the stomach. Electronically Signed   By: Minerva Fester M.D.   On: 02/19/2023 23:44   DG Chest Port 1 View  Result Date: 02/19/2023 CLINICAL DATA:  NG tube placement EXAM: PORTABLE CHEST 1 VIEW COMPARISON:  02/12/2023 FINDINGS: NG tube tip is in the distal esophagus. Heart mediastinal contours within normal limits. Visualized lungs clear. No effusions. Lung apices not visualized. IMPRESSION: NG tube tip in the distal esophagus. This could be advanced several cm into the stomach. Electronically Signed   By: Charlett Nose M.D.   On: 02/19/2023 22:14   CT ABDOMEN PELVIS W CONTRAST  Result Date: 02/19/2023 CLINICAL DATA:  Nausea and vomiting for 3 days.  Abdominal pain. EXAM: CT ABDOMEN AND PELVIS WITH CONTRAST TECHNIQUE: Multidetector CT imaging of the abdomen and pelvis was performed using the standard protocol following bolus administration of intravenous contrast. RADIATION DOSE REDUCTION: This exam was performed according to the departmental dose-optimization program which includes automated exposure control, adjustment of the mA and/or kV according to patient size and/or use of iterative reconstruction technique. CONTRAST:  75mL OMNIPAQUE IOHEXOL 300 MG/ML  SOLN COMPARISON:  07/07/2015 FINDINGS: Lower chest: No acute abnormality. Hepatobiliary: Respiratory motion mildly degrades detail in the upper abdomen. Unremarkable liver. Cholecystectomy. Prominent bile duct likely due to reservoir effect. Pancreas: Coarse calcifications in the pancreatic head due to sequela of chronic pancreatitis. No acute pancreatitis. No ductal dilation. Spleen: Unremarkable. Adrenals/Urinary Tract: Stable adrenal glands. No urinary calculi or hydronephrosis. Nondistended bladder about a Foley catheter. Stomach/Bowel: Stomach is within normal limits. Postoperative changes with neoterminal ileum. Marked dilation of the small bowel with air-fluid levels and transition point in the low posterior abdomen (series 2/image 50).  Decompressed colon. Colonic diverticulosis without diverticulitis. Duodenal diverticulum. Vascular/Lymphatic: Aortic atherosclerosis. No enlarged abdominal or pelvic lymph nodes. Reproductive: Uterus and bilateral adnexa are unremarkable. Other: No free intraperitoneal fluid or air. 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 Musculoskeletal: Left THA.  No acute fracture. IMPRESSION: 1. Small-bowel obstruction with transition point in the low posterior abdomen. Aortic Atherosclerosis (ICD10-I70.0). Electronically Signed   By: Minerva Fester M.D.   On: 02/19/2023 18:04   CT Head Wo Contrast  Result Date: 02/19/2023 CLINICAL DATA:  Headache, sudden, severe.  Nausea and vomiting. EXAM: CT HEAD WITHOUT CONTRAST TECHNIQUE: Contiguous axial images were obtained from the base of the skull through the vertex without intravenous contrast. RADIATION DOSE REDUCTION: This exam was performed according to the departmental dose-optimization program which includes automated exposure control, adjustment of the mA and/or kV according to patient size and/or use of iterative reconstruction technique. COMPARISON:  CTA head and neck 02/09/2023.  MRI head 02/08/2023. FINDINGS: Brain: Hypodensity associated with the posterior right MCA territory infarct has decreased compared to the prior CT with cortex currently only being subtly hypodense likely reflecting fogging phenomenon. No new infarct, acute hemorrhage, mass, or midline shift is evident. Small chronic left parieto-occipital cortical infarcts are again noted, and chronic small vessel ischemia is again noted involving the cerebral white matter bilaterally. Prominent extra-axial CSF over both cerebral convexities is unchanged and attributed to atrophy. Prominent extra-axial CSF in the posterior fossa overlying the left greater than right cerebellar hemispheres is also unchanged. Vascular: Calcified atherosclerosis at the skull base. No hyperdense vessel. Skull: No acute  fracture or suspicious osseous lesion. Sinuses/Orbits: Paranasal sinuses and mastoid air cells are clear. Unremarkable orbits. Other: None. IMPRESSION: 1. No evidence of acute intracranial abnormality. 2. Interval evolution of a subacute posterior right MCA territory infarct. 3. Chronic small vessel ischemia. Electronically Signed   By: Sebastian Ache M.D.   On: 02/19/2023 18:02    Review of Systems  Unable to perform ROS: Mental acuity    PE Blood pressure 128/63, pulse (!) 107, temperature 97.6 F (36.4 C), temperature source Oral, resp. rate (!) 21, height 5\' 5"  (1.651 m), weight 54.6 kg, SpO2 99%. Constitutional: NAD; conversant; no deformities Eyes: Moist conjunctiva; no lid lag; anicteric; PERRL Neck: Trachea midline; no thyromegaly Lungs: Normal respiratory effort; no tactile fremitus CV: RRR; no palpable thrills; no pitting edema GI: Abd distended, nontender, no erythema or hernias; no palpable hepatosplenomegaly MSK: unable to assess gait; no clubbing/cyanosis Psychiatric: Appropriate affect; alert and  oriented x3 Lymphatic: No palpable cervical or axillary lymphadenopathy Skin: No major subcutaneous nodules. Warm and dry   Assessment/Plan: 77 yo female here with somnolence, cardiac concerns, leukocytosis and bowel obstruction  I recommend an NG tube be placed to low intermittent suction Recommend admission to medical service for multiple active issues Bowel rest  I reviewed CT scan images showing dilated intestine and stomach without signs of volvulus  I reviewed last 24 h vitals and pain scores, last 48 h intake and output, last 24 h labs and trends, and last 24 h imaging results.  This care required high  level of medical decision making.   [name] 02/20/2023, 2:00 AM

## 2023-02-20 NOTE — Progress Notes (Signed)
Patient seen earlier this morning by Dr. Sheliah Hatch with our service.  Patient is not having any abdominal pain currently, but wants her NGT out.  She denies flatus or BM.  WBC is down today from 29 to 20K.  Hgb has dropped 2 grams to 7.6 from 9.5 yesterday.  Lactic acid is normal.  Her abdominal exam is benign witt a soft abdomen and nontender.  NGT with minimal mildly watered down red/pink output, ~50cc.  NGT flushed with air and water.  Clear watered returned.  No bilious output.  NGT was advanced from film at 0245am this morning by nursing report.  Gastrografin being given right now.  Will follow up on 8-hr delay film.  Continue conservative management at this time.  Letha Cape, PA-C 8:59 AM 02/20/2023

## 2023-02-20 NOTE — Consult Note (Signed)
WOC Nurse Consult Note: Reason for Consult: wound upper back  Wound type: full thickness wound of unknown etiology upper back  Pressure Injury POA: NA  Measurement: 1 cm x 1 cm x 1 cm with 2 cm undermining circumferential  Wound bed: 100% pink and moist what can be visualized  Drainage (amount, consistency, odor) minimal serosanguinous  Periwound: intact  Dressing procedure/placement/frequency: Clean wound to upper back with NS, cut a strip of silver hydrofiber [place] [phone]) and using a Q tip applicator insert into wound bed making sure to cover areas of undermining.  This should be performed daily.  Cover with dry gauze and silicone foam.   POC discussed with bedside nurse. WOC team will not follow at this time. Re-consult if further needs arise.   Thank you,    Priscella Mann MSN, RN-BC, [employer] [phone]

## 2023-02-20 NOTE — Consult Note (Addendum)
Cardiology Consultation   Patient ID: RUWAIDA WILDING MRN: [id number]; DOB: December 07, 1945  Admit date: 02/19/2023 Date of Consult: 02/20/2023  PCP:  Assunta Found, MD   [facility] Providers Cardiologist:  None        Patient Profile:   RHAYA BUENING is a 77 y.o. female with a hx of HTN, CKD, Crohn's, PAD s/p CEA in 2004, h/o prior bowel obstruction s/p hemicolectomy who is being seen 02/20/2023 for the evaluation of EKG changes and abnormal HS troponin at the request of Dr. Bebe Shaggy.  History of Present Illness:   Ms. Patient came to the ED from a nursing facility w/ c/o abdominal pain, distension, N/V, weakness and dehydration. She was found to have acute bowel obstruction on abdominal imaging. She also has severe leukocytosis w/ WBC count near 30, presumed UTI, and Hgb <10 down from 13-15 two weeks ago. She had recent CVA; she was discharged from [facility] on 02-13-23 after being admitted for acute CVA due to R ICA stenosis. During that admission, she also had severe hyponatremia (Na 115), SIADH.  TTE 02/08/23 showed normal LVEF >70% w/o RWMA. At that time, she was deemed not a candidate for endarterectomy given high operative risk. As aforementioned, this admission was primarily for acute bowel obstruction. Pt never really had any cardiac complaints, but a routine EKG was done for hospital admission which showed rather diffuse ST elevation in the inferior and precordial leads w/o reciprocal depression. HS trop found to be mildly elevated at 127. When I question patient regarding CP, her CP is right-sided, and reproducible with palpation of the chest wall on the right side.   Past Medical History:  Diagnosis Date   Anemia, unspecified    Arthritis    Crohn's disease (HCC)    Degenerative disc disease, lumbar    Hypertension    Sciatica of right side    Stroke [facility])    Ventral hernia     Past Surgical History:  Procedure Laterality Date   CAROTID ENDARTERECTOMY     2004    CHOLECYSTECTOMY     2008   ESOPHAGOGASTRODUODENOSCOPY N/A 02/24/2015   Procedure: ESOPHAGOGASTRODUODENOSCOPY (EGD);  Surgeon: Corbin Ade, MD;  Location: [facility];  Service: Endoscopy;  Laterality: N/A;   HEMICOLECTOMY     right   HIP ARTHROPLASTY Left 04/09/2015   Procedure: INSERT PARTIAL HIP REPLACEMENT LEFT;  Surgeon: Vickki Hearing, MD;  Location: [facility];  Service: Orthopedics;  Laterality: Left;   INCISIONAL HERNIA REPAIR N/A 05/20/2013   Procedure: Sherald Hess HERNIORRHAPHY WITH MESH;  Surgeon: Dalia Heading, MD;  Location: [facility];  Service: General;  Laterality: N/A;   INCISIONAL HERNIA REPAIR N/A 05/19/2014   Procedure: RECURRENT HERNIA REPAIR INCISIONAL;  Surgeon: Dalia Heading, MD;  Location: [facility];  Service: General;  Laterality: N/A;   INSERTION OF MESH N/A 05/20/2013   Procedure: INSERTION OF MESH;  Surgeon: Dalia Heading, MD;  Location: [facility];  Service: General;  Laterality: N/A;   INSERTION OF MESH N/A 05/19/2014   Procedure: INSERTION OF MESH;  Surgeon: Dalia Heading, MD;  Location: [facility];  Service: General;  Laterality: N/A;   TUBAL LIGATION       Home Medications:  Prior to Admission medications   Medication Sig Start Date End Date Taking? Authorizing Provider  acetaminophen (TYLENOL) 500 MG tablet Take 1 tablet (500 mg total) by mouth every 6 (six) hours as needed for mild pain. 04/12/15   Brendia Sacks  P, MD  aspirin EC 81 MG tablet Take 1 tablet (81 mg total) by mouth daily. Resume in 1 week Patient taking differently: Take 81 mg by mouth daily. 02/25/15   Erick Blinks, MD  clopidogrel (PLAVIX) 75 MG tablet Take 1 tablet (75 mg total) by mouth daily. 02/14/23   Rhetta Mura, MD  HYDROcodone-acetaminophen (NORCO/VICODIN) 5-325 MG tablet Take 1 tablet by mouth every 6 (six) hours as needed for moderate pain. 02/13/23   Rhetta Mura, MD  methocarbamol (ROBAXIN) 500 MG tablet Take 1 tablet (500 mg total) by mouth every 8 (eight) hours as  needed for muscle spasms. 02/13/23   Rhetta Mura, MD  metoprolol succinate (TOPROL XL) 25 MG 24 hr tablet Take 0.5 tablets (12.5 mg total) by mouth daily. 02/13/23 02/13/24  Rhetta Mura, MD  nicotine (NICODERM CQ - DOSED IN MG/24 HOURS) 21 mg/24hr patch Place 1 patch (21 mg total) onto the skin daily. 02/14/23   Rhetta Mura, MD  sodium bicarbonate 650 MG tablet Take 1 tablet (650 mg total) by mouth 2 (two) times daily. 02/13/23   Rhetta Mura, MD  sulfaSALAzine (AZULFIDINE) 500 MG tablet TAKE (2) TABLETS BY MOUTH TWICE DAILY. 11/13/22   Carlan, Chelsea L, NP  urea (URE-NA) 15 g PACK oral packet Take 15 g by mouth 2 (two) times daily. 02/13/23   Rhetta Mura, MD  witch hazel-glycerin (TUCKS) pad Apply topically as needed for itching. 02/13/23   Rhetta Mura, MD    Inpatient Medications: Scheduled Meds:  Chlorhexidine Gluconate Cloth  6 each Topical Daily   Continuous Infusions:  dextrose 5 % and 0.9 % NaCl with KCl 20 mEq/L 75 mL/hr at 02/20/23 0130   piperacillin-tazobactam (ZOSYN)  IV Stopped (02/20/23 0123)   PRN Meds: morphine injection  Allergies:    Allergies  Allergen Reactions   Flagyl [Metronidazole Hcl] Other (See Comments)    "makes me feel whoozy and weird"   Other     Unknown antibiotic and unknown pain medications     Social History:   Social History   Socioeconomic History   Marital status: Widowed    Spouse name: Not on file   Number of children: Not on file   Years of education: Not on file   Highest education level: Not on file  Occupational History   Not on file  Tobacco Use   Smoking status: Every Day    Current packs/day: 1.50    Average packs/day: 1.5 packs/day for 30.0 years (45.0 ttl pk-yrs)    Types: Cigarettes   Smokeless tobacco: Never   Tobacco comments:    1-2 pack day x 20 yrs.  Vaping Use   Vaping status: Never Used  Substance and Sexual Activity   Alcohol use: No    Alcohol/week: 1.0  standard drink of alcohol    Types: 1 Standard drinks or equivalent per week    Comment: one every 2 -3 months.   Drug use: No   Sexual activity: Not on file  Other Topics Concern   Not on file  Social History Narrative   Not on file   Social Determinants of Health   Financial Resource Strain: Not on file  Food Insecurity: No Food Insecurity (02/19/2023)   Hunger Vital Sign    Worried About Running Out of Food in the Last Year: Never true    Ran Out of Food in the Last Year: Never true  Transportation Needs: No Transportation Needs (02/19/2023)   PRAPARE - Transportation    Lack  of Transportation (Medical): No    Lack of Transportation (Non-Medical): No  Physical Activity: Not on file  Stress: Not on file  Social Connections: Not on file  Intimate Partner Violence: Not At Risk (02/19/2023)   Humiliation, Afraid, Rape, and Kick questionnaire    Fear of Current or Ex-Partner: No    Emotionally Abused: No    Physically Abused: No    Sexually Abused: No    Family History:    Family History  Problem Relation Age of Onset   GI problems Father    Stroke Brother    Heart attack Mother    Hypotension Sister      ROS:  Please see the history of present illness.   All other ROS reviewed and negative.     Physical Exam/Data:   Vitals:   02/19/23 2245 02/19/23 2300 02/19/23 2315 02/20/23 0126  BP:  120/60  128/63  Pulse: (!) 113 (!) 117 (!) 122 (!) 107  Resp: 16 (!) 23 14 (!) 21  Temp:    97.6 F (36.4 C)  TempSrc:    Oral  SpO2: 100% 100% 100% 99%  Weight:      Height:        Intake/Output Summary (Last 24 hours) at 02/20/2023 0135 Last data filed at 02/19/2023 1515 Gross per 24 hour  Intake --  Output 1925 ml  Net -1925 ml      02/19/2023    2:03 PM 02/10/2023    4:59 AM 02/09/2023    4:41 AM  Last 3 Weights  Weight (lbs) 120 lb 5.9 oz 120 lb 5.9 oz 115 lb 15.4 oz  Weight (kg) 54.6 kg 54.6 kg 52.6 kg     Body mass index is 20.03 kg/m.  General:  Well nourished,  well developed, in no acute distress. Pt sleeping comfortably HEENT: normal Neck: no JVD Vascular: No carotid bruits Cardiac:  normal S1, S2; RRR; 2/6 sys murmur at ULSB Lungs:  clear to auscultation bilaterally, no wheezing, rhonchi or rales  Abd: soft, nontender, no hepatomegaly  Ext: no edema Musculoskeletal:  No deformities, BUE and BLE strength normal and equal Skin: warm and dry  Neuro:  CNs 2-12 intact, no focal abnormalities noted Psych:  Normal affect   EKG:  The EKG was personally reviewed and demonstrates:  NSR with ST elevation inferior, V3-V6 Telemetry:  Telemetry was personally reviewed and demonstrates:  NSR  Relevant CV Studies: TTE 02-08-23  1. Left ventricular ejection fraction, by estimation, is 70 to 75%. The  left ventricle has hyperdynamic function. The left ventricle has no  regional wall motion abnormalities. There is mild asymmetric left  ventricular hypertrophy of the basal-septal  segment. Left ventricular diastolic parameters are consistent with Grade I  diastolic dysfunction (impaired relaxation).   2. Right ventricular systolic function is normal. The right ventricular  size is normal.   3. The mitral valve was not well visualized. No evidence of mitral valve  regurgitation.   4. The aortic valve was not well visualized. There is mild calcification  of the aortic valve. Aortic valve regurgitation is not visualized.   Laboratory Data:  High Sensitivity Troponin:   Recent Labs  Lab 02/19/23 2319  TROPONINIHS 127*     Chemistry Recent Labs  Lab 02/13/23 1032 02/19/23 1415 02/19/23 1424  NA 131* 136 135  K 3.7 3.4* 3.6  CL 96* 93* 96*  CO2 21* 22  --   GLUCOSE 110* 136* 133*  BUN 31* 51* 47*  CREATININE 0.94 1.38* 1.50*  CALCIUM 8.5* 9.6  --   GFRNONAA >60 39*  --   ANIONGAP 14 21*  --     Recent Labs  Lab 02/19/23 1415  PROT 6.2*  ALBUMIN 3.4*  AST 29  ALT 23  ALKPHOS 49  BILITOT 1.0   Lipids No results for input(s): "CHOL",  "TRIG", "HDL", "LABVLDL", "LDLCALC", "CHOLHDL" in the last 168 hours.  Hematology Recent Labs  Lab 02/19/23 1415 02/19/23 1424 02/20/23 0150  WBC 29.2*  --   --   RBC 2.73*  --   --   HGB 9.5* 10.2* 7.5*  HCT 28.1* 30.0* 22.0*  MCV 102.9*  --   --   MCH 34.8*  --   --   MCHC 33.8  --   --   RDW 13.6  --   --   PLT 343  --   --    Thyroid No results for input(s): "TSH", "FREET4" in the last 168 hours.  BNPNo results for input(s): "BNP", "PROBNP" in the last 168 hours.  DDimer No results for input(s): "DDIMER" in the last 168 hours.   Radiology/Studies:  DG Chest Portable 1 View  Result Date: 02/19/2023 CLINICAL DATA:  NG tube advancement. EXAM: PORTABLE CHEST 1 VIEW COMPARISON:  Radiographs 02/19/2023 at 9:39 p.m. FINDINGS: Similar position of the NG tube tip near the gastroesophageal junction and side-port in the distal esophagus. Recommend advancement approximately 10 cm to ensure side port is within the stomach. Remainder unchanged. IMPRESSION: Similar position of the NG tube in the distal esophagus. Recommend advancement approximately 10 cm to ensure side port is within the stomach. Electronically Signed   By: Minerva Fester M.D.   On: 02/19/2023 23:44   DG Chest Port 1 View  Result Date: 02/19/2023 CLINICAL DATA:  NG tube placement EXAM: PORTABLE CHEST 1 VIEW COMPARISON:  02/12/2023 FINDINGS: NG tube tip is in the distal esophagus. Heart mediastinal contours within normal limits. Visualized lungs clear. No effusions. Lung apices not visualized. IMPRESSION: NG tube tip in the distal esophagus. This could be advanced several cm into the stomach. Electronically Signed   By: Charlett Nose M.D.   On: 02/19/2023 22:14   CT ABDOMEN PELVIS W CONTRAST  Result Date: 02/19/2023 CLINICAL DATA:  Nausea and vomiting for 3 days.  Abdominal pain. EXAM: CT ABDOMEN AND PELVIS WITH CONTRAST TECHNIQUE: Multidetector CT imaging of the abdomen and pelvis was performed using the standard protocol  following bolus administration of intravenous contrast. RADIATION DOSE REDUCTION: This exam was performed according to the departmental dose-optimization program which includes automated exposure control, adjustment of the mA and/or kV according to patient size and/or use of iterative reconstruction technique. CONTRAST:  75mL OMNIPAQUE IOHEXOL 300 MG/ML  SOLN COMPARISON:  07/07/2015 FINDINGS: Lower chest: No acute abnormality. Hepatobiliary: Respiratory motion mildly degrades detail in the upper abdomen. Unremarkable liver. Cholecystectomy. Prominent bile duct likely due to reservoir effect. Pancreas: Coarse calcifications in the pancreatic head due to sequela of chronic pancreatitis. No acute pancreatitis. No ductal dilation. Spleen: Unremarkable. Adrenals/Urinary Tract: Stable adrenal glands. No urinary calculi or hydronephrosis. Nondistended bladder about a Foley catheter. Stomach/Bowel: Stomach is within normal limits. Postoperative changes with neoterminal ileum. Marked dilation of the small bowel with air-fluid levels and transition point in the low posterior abdomen (series 2/image 50). Decompressed colon. Colonic diverticulosis without diverticulitis. Duodenal diverticulum. Vascular/Lymphatic: Aortic atherosclerosis. No enlarged abdominal or pelvic lymph nodes. Reproductive: Uterus and bilateral adnexa are unremarkable. Other: No free intraperitoneal fluid or  air. 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 Musculoskeletal: Left THA.  No acute fracture. IMPRESSION: 1. Small-bowel obstruction with transition point in the low posterior abdomen. Aortic Atherosclerosis (ICD10-I70.0). Electronically Signed   By: Minerva Fester M.D.   On: 02/19/2023 18:04   CT Head Wo Contrast  Result Date: 02/19/2023 CLINICAL DATA:  Headache, sudden, severe.  Nausea and vomiting. EXAM: CT HEAD WITHOUT CONTRAST TECHNIQUE: Contiguous axial images were obtained from the base of the skull through the vertex without  intravenous contrast. RADIATION DOSE REDUCTION: This exam was performed according to the departmental dose-optimization program which includes automated exposure control, adjustment of the mA and/or kV according to patient size and/or use of iterative reconstruction technique. COMPARISON:  CTA head and neck 02/09/2023.  MRI head 02/08/2023. FINDINGS: Brain: Hypodensity associated with the posterior right MCA territory infarct has decreased compared to the prior CT with cortex currently only being subtly hypodense likely reflecting fogging phenomenon. No new infarct, acute hemorrhage, mass, or midline shift is evident. Small chronic left parieto-occipital cortical infarcts are again noted, and chronic small vessel ischemia is again noted involving the cerebral white matter bilaterally. Prominent extra-axial CSF over both cerebral convexities is unchanged and attributed to atrophy. Prominent extra-axial CSF in the posterior fossa overlying the left greater than right cerebellar hemispheres is also unchanged. Vascular: Calcified atherosclerosis at the skull base. No hyperdense vessel. Skull: No acute fracture or suspicious osseous lesion. Sinuses/Orbits: Paranasal sinuses and mastoid air cells are clear. Unremarkable orbits. Other: None. IMPRESSION: 1. No evidence of acute intracranial abnormality. 2. Interval evolution of a subacute posterior right MCA territory infarct. 3. Chronic small vessel ischemia. Electronically Signed   By: Sebastian Ache M.D.   On: 02/19/2023 18:02     Assessment and Plan:   Abnormal EKG w/ ST elevation: pt overall has no significant cardiac complaints; her pain is abdominal, from bowel obstruction. HS trop minimally elevated. echo findings unremarkable--EF>60%, no obvious RWMA. Pt is s/p recent CVA, now w/ acute bowel obstruction, recent precipitous drop in Hgb, and worsening renal function. Risk of LHC at this time likely outweighs significant benefit. Would recommend conservative  management. She will be high risk for any surgery given the cardiac situation and other significant comorbidities. Would not anti-coagulate until the cause for drop in Hgb is clarified as most recent Hgb was 7.5.  2. Non-cardiac medical issues: per primary team   Risk Assessment/Risk Scores:     TIMI Risk Score for ST  Elevation MI:   The patient's TIMI risk score is 9, which indicates a 35.9% risk of all cause mortality at 30 days.         For questions or updates, please contact [facility] Please consult [email or web address] for contact info under    Signed, Precious Reel, MD, [facility]  02/20/2023 1:35 AM  Attending Attestation: Patient seen and examined, note reviewed with the signed Resident Physician/Advanced Practice Provider. I personally reviewed laboratory data, imaging studies and relevant notes. I independently examined the patient and formulated the important aspects of the plan. I have personally discussed the plan with the patient and/or family. Comments or changes to the note/plan are indicated below.  My Exam:  General: Ill-appearing Head: Atraumatic, normal size  Eyes: PEERLA, EOMI  Neck: Supple, no JVD Endocrine: No thryomegaly Cardiac: Normal S1, S2; RRR; no murmurs, rubs, or gallops Lungs: Clear to auscultation bilaterally, no wheezing, rhonchi or rales  Abd: Tender abdomen, nondistended Ext: No edema, pulses 2+ Musculoskeletal: No deformities Skin:  Warm and dry, no rashes    Telemetry: Sinus tachycardia heart rates 100s EKG: Sinus tachycardia heart rate 100, minimal diffuse ST elevations Echo: LVEF 65 to 70%, no regional wall motion abnormalities Cath: None  Assessment & Plan: 77 year old female with history of carotid artery disease (L CEA in 2003, right ICA stenosis in 2024), Crohn's disease, CKD, hypertension, stroke, small bowel obstruction who was admitted on 02/19/2023 with small bowel obstruction.  Cardiology consulted for abnormal EKG.  #  Abnormal EKG # Elevated troponin, demand # Small bowel obstruction # Severe hypokalemia # Anemia -Currently admitted to the hospital with small bowel obstruction.  Reports no chest pain but EKG shows diffuse ST elevation which is improving.  She has significant leukocytosis and hemoglobin values which are trending down.  She also has profound hypokalemia. -She reports no chest pain.  Her echo was normal.  Her EKG is actually improving. -Overall, I suspect her EKG changes are related to her intra-abdominal process. -She has known PAD and carotid artery disease.  Suspect her troponin elevation is secondary to demand.  This is not an acute coronary syndrome. -Given ongoing anemia would not recommend heparin drip.  Would not recommend aspirin either given hemoglobin is downtrending.  She also had a recent stroke and was on Plavix.  Likely would hold both of these. -Can restart statin when you are able.  Currently not tolerating oral medicines. -Vitals are stable. -Overall, would recommend limited medical management.  Given her stroke and critical illness she is not a candidate for invasive angiography.  Given her normal echo this is not necessary either. -For now would continue with a conservative approach.  Cardiology to be available as needed.  # Anemia -Hold aspirin and Plavix given downtrending hemoglobin values.  # Right ICA stenosis # Recent stroke -No strokelike symptoms.  This is being managed conservatively.  # Small bowel obstruction # Leukocytosis # Hypokalemia -Per hospital medicine  Cardiology service available as needed. Please call with questions.  Signed, Lenna Gilford. Flora Lipps, MD [facility]  [facility]  02/20/2023 9:17 AM

## 2023-02-20 NOTE — ED Provider Notes (Signed)
Seen by dr Sheliah Hatch with general surgery Recommends NG Tube Admit to medical service    Zadie Rhine, MD 02/20/23 0201

## 2023-02-20 NOTE — Progress Notes (Signed)
PROGRESS NOTE    [name]  ZOX:[id number] DOB: [birth year] DOA: 02/19/2023 PCP: Assunta Found, MD    Brief Narrative:  77 year old with history of previous GI bleed, hypertension, Crohn's disease, recent stroke with left hemiparesis, recent admission to the hospital 8/24-8/30 for acute stroke and discharged to a skilled nursing home.  She presents back to the emergency room from SNF with abdominal pain, distention and nausea vomiting over the last 3 days.  Also reported headache and back pain. At the emergency room normotensive.  Normothermic.  Heart rate 111-127.  Oxygen 93% on room air.  After given a dose of fentanyl, oxygen saturations dropped to 87%.  Leukocytosis with white blood cell count of 29,000.  Potassium 3.4.  Patient was started on IV fluid boluses, cultures were collected and is started on IV Zosyn.  She had 2200 mL urine, Foley was inserted.  CT scan abdomen pelvis showed small bowel obstruction with transition point in the lower posterior abdomen. Initial EKG suggested ST elevation, ruled out.  Patient was ultimately transferred to [facility] for higher level of care.   Assessment & Plan:   Partial small bowel obstruction due to adhesions: N.p.o., NG tube suction, aggressive electrolyte correction.  Adequate pain medications.  Surgery following. Follow-up small bowel protocol pending.  Sepsis secondary to UTI: UA consistent with UTI.  Met sepsis criteria with tachycardia and leukocytosis.  Blood cultures, urine cultures.  Zosyn started.  Continue until final improvement.  Abnormal EKG with ST elevation: Seen by cardiology.  Mildly elevated troponins, echo findings unremarkable.  Normal ejection fraction.  Patient with acute small bowel obstruction, recently stroke.  Symptomatic treatment.  Not a candidate for anticoagulation or left heart cath.  Will continue to follow. Will involve palliative care.  Hypokalemia: Severe and persistent.  Replace aggressively and  monitor levels.  Chronic medical issues including Recent history of upper GI bleeding, hemoglobin slightly drifting down.  Will monitor.  On Protonix.  No episode of active bleeding. AKI, continue IV fluids. History of stroke, hold aspirin and Plavix. Hypertension, blood pressure stable.  Holding antihypertensives. Chron's disease, holding sulfasalazine.   DVT prophylaxis: SCDs Start: 02/20/23 8119   Code Status: Full code Family Communication: None at the bedside Disposition Plan: Status is: Inpatient Remains inpatient appropriate because: Bowel obstruction, NG tube,     Consultants:  General Surgery Cardiology Palliative  Procedures:  None  Antimicrobials:  Zosyn 9/5---   Subjective: Patient seen in the morning rounds.  She tells me to get her NG tube out of her mouth.  Difficult historian.  Mouth is dry.  Does not want to interact much.  NG tube is clamped and belly is soft.  Objective: Vitals:   02/20/23 0747 02/20/23 1200 02/20/23 1300 02/20/23 1400  BP:  128/62    Pulse:  (!) 108 98 (!) 105  Resp:  19 (!) 0 18  Temp: 98.3 F (36.8 C) 98.2 F (36.8 C)    TempSrc: Oral Oral    SpO2:  100% 100% 100%  Weight:      Height:        Intake/Output Summary (Last 24 hours) at 02/20/2023 1500 Last data filed at 02/20/2023 1300 Gross per 24 hour  Intake 30 ml  Output 2555 ml  Net -2525 ml   Filed Weights   02/19/23 1403 02/20/23 0318  Weight: 54.6 kg 52.1 kg    Examination:  General exam: Appears anxious.  Slightly uncomfortable with NG tube. Mucous membranes are dry. Respiratory  system: No added sounds. Cardiovascular system: S1 & S2 heard, RRR.  Gastrointestinal system: Soft.  Nontender.  Bowel sound present. Central nervous system: Alert , anxious.  Moves all extremities.  Uncooperative. Foley catheter with clear urine.    Data Reviewed: I have personally reviewed following labs and imaging studies  CBC: Recent Labs  Lab 02/19/23 1415  02/19/23 1424 02/20/23 0150 02/20/23 0642  WBC 29.2*  --   --  20.2*  NEUTROABS 26.8*  --   --   --   HGB 9.5* 10.2* 7.5* 7.6*  HCT 28.1* 30.0* 22.0* 23.9*  MCV 102.9*  --   --  108.1*  PLT 343  --   --  220   Basic Metabolic Panel: Recent Labs  Lab 02/19/23 1415 02/19/23 1424 02/20/23 0150 02/20/23 0642  NA 136 135 141 141  K 3.4* 3.6 2.7* 2.6*  CL 93* 96* 103 106  CO2 22  --   --  24  GLUCOSE 136* 133* 109* 126*  BUN 51* 47* 39* 36*  CREATININE 1.38* 1.50* 1.10* 1.02*  CALCIUM 9.6  --   --  8.5*   GFR: Estimated Creatinine Clearance: 37.4 mL/min (A) (by C-G formula based on SCr of 1.02 mg/dL (H)). Liver Function Tests: Recent Labs  Lab 02/19/23 1415  AST 29  ALT 23  ALKPHOS 49  BILITOT 1.0  PROT 6.2*  ALBUMIN 3.4*   Recent Labs  Lab 02/19/23 1415  LIPASE 64*   No results for input(s): "AMMONIA" in the last 168 hours. Coagulation Profile: No results for input(s): "INR", "PROTIME" in the last 168 hours. Cardiac Enzymes: No results for input(s): "CKTOTAL", "CKMB", "CKMBINDEX", "TROPONINI" in the last 168 hours. BNP (last 3 results) No results for input(s): "PROBNP" in the last 8760 hours. HbA1C: No results for input(s): "HGBA1C" in the last 72 hours. CBG: Recent Labs  Lab 02/19/23 2244 02/20/23 0135 02/20/23 1158  GLUCAP 109* 106* 106*   Lipid Profile: No results for input(s): "CHOL", "HDL", "LDLCALC", "TRIG", "CHOLHDL", "LDLDIRECT" in the last 72 hours. Thyroid Function Tests: No results for input(s): "TSH", "T4TOTAL", "FREET4", "T3FREE", "THYROIDAB" in the last 72 hours. Anemia Panel: No results for input(s): "VITAMINB12", "FOLATE", "FERRITIN", "TIBC", "IRON", "RETICCTPCT" in the last 72 hours. Sepsis Labs: Recent Labs  Lab 02/19/23 2139 02/20/23 0149  LATICACIDVEN 1.3 1.1    Recent Results (from the past 240 hour(s))  SARS Coronavirus 2 by RT PCR (hospital order, performed in [facility] hospital lab) *cepheid single result test* Anterior  Nasal Swab     Status: None   Collection Time: 02/19/23  2:13 PM   Specimen: Anterior Nasal Swab  Result Value Ref Range Status   SARS Coronavirus 2 by RT PCR NEGATIVE NEGATIVE Final    Comment: (NOTE) SARS-CoV-2 target nucleic acids are NOT DETECTED.  The SARS-CoV-2 RNA is generally detectable in upper and lower respiratory specimens during the acute phase of infection. The lowest concentration of SARS-CoV-2 viral copies this assay can detect is 250 copies / mL. A negative result does not preclude SARS-CoV-2 infection and should not be used as the sole basis for treatment or other patient management decisions.  A negative result may occur with improper specimen collection / handling, submission of specimen other than nasopharyngeal swab, presence of viral mutation(s) within the areas targeted by this assay, and inadequate number of viral copies (<250 copies / mL). A negative result must be combined with clinical observations, patient history, and epidemiological information.  Fact Sheet for Patients:  RoadLapTop.co.za  Fact Sheet for Healthcare Providers: http://kim-miller.com/  This test is not yet approved or  cleared by the [place] FDA and has been authorized for detection and/or diagnosis of SARS-CoV-2 by FDA under an Emergency Use Authorization (EUA).  This EUA will remain in effect (meaning this test can be used) for the duration of the COVID-19 declaration under Section 564(b)(1) of the Act, 21 U.S.C. section 360bbb-3(b)(1), unless the authorization is terminated or revoked sooner.  Performed at [facility], [address] [place], [place] [address]   Culture, blood (Routine X 2) w Reflex to ID Panel     Status: None (Preliminary result)   Collection Time: 02/19/23  9:30 PM   Specimen: BLOOD  Result Value Ref Range Status   Specimen Description BLOOD BLOOD RIGHT HAND  Final   Special Requests   Final    BOTTLES  DRAWN AEROBIC ONLY Blood Culture results may not be optimal due to an inadequate volume of blood received in culture bottles   Culture   Final    NO GROWTH < 12 HOURS Performed at [facility], [address] [place], [place] [address]    Report Status PENDING  Incomplete  Culture, blood (Routine X 2) w Reflex to ID Panel     Status: None (Preliminary result)   Collection Time: 02/19/23 11:19 PM   Specimen: BLOOD  Result Value Ref Range Status   Specimen Description BLOOD LEFT ANTECUBITAL  Final   Special Requests   Final    BOTTLES DRAWN AEROBIC AND ANAEROBIC Blood Culture results may not be optimal due to an excessive volume of blood received in culture bottles   Culture   Final    NO GROWTH < 12 HOURS Performed at [facility], [address], [place], [place] [address]    Report Status PENDING  Incomplete         Radiology Studies: ECHOCARDIOGRAM COMPLETE  Result Date: 02/20/2023    ECHOCARDIOGRAM REPORT   Patient Name:   [name] Date of Exam: 02/20/2023 Medical Rec #:  [id number]    Height:       65.0 in Accession #:    [id number]   Weight:       120.4 lb Date of Birth:  08/31/1945    BSA:          1.594 m Patient Age:    77 years     BP:           131/65 mmHg Patient Gender: F            HR:           97 bpm. Exam Location:  Inpatient Procedure: 2D Echo, Color Doppler and Cardiac Doppler Indications:    Abnormal ECG  History:        Patient has prior history of Echocardiogram examinations, most                 recent 02/08/2023. Stroke; Risk Factors:Hypertension and Current                 Smoker.  Sonographer:    Milda Smart Referring Phys: [phone] DONALD WICKLINE  Sonographer Comments: Image acquisition challenging due to patient body habitus and Image acquisition challenging due to respiratory motion. IMPRESSIONS  1. Left ventricular ejection fraction, by estimation, is 65 to 70%. The left ventricle has normal function. The left ventricle has no regional wall motion abnormalities.  Left ventricular diastolic parameters are consistent with Grade I diastolic  dysfunction (impaired relaxation).  2. Right ventricular systolic function is normal. The right ventricular size is mildly enlarged. There is normal pulmonary artery systolic pressure. The estimated right ventricular systolic pressure is 19.6 mmHg.  3. The mitral valve is grossly normal. Trivial mitral valve regurgitation. No evidence of mitral stenosis.  4. The aortic valve is tricuspid. There is mild calcification of the aortic valve. There is mild thickening of the aortic valve. Aortic valve regurgitation is not visualized. Aortic valve sclerosis/calcification is present, without any evidence of aortic stenosis. Aortic valve area, by VTI measures 1.74 cm.  5. The inferior vena cava is normal in size with greater than 50% respiratory variability, suggesting right atrial pressure of 3 mmHg. Comparison(s): No significant change from prior study. FINDINGS  Left Ventricle: Left ventricular ejection fraction, by estimation, is 65 to 70%. The left ventricle has normal function. The left ventricle has no regional wall motion abnormalities. The left ventricular internal cavity size was normal in size. There is  no left ventricular hypertrophy. Left ventricular diastolic parameters are consistent with Grade I diastolic dysfunction (impaired relaxation). Right Ventricle: The right ventricular size is mildly enlarged. No increase in right ventricular wall thickness. Right ventricular systolic function is normal. There is normal pulmonary artery systolic pressure. The tricuspid regurgitant velocity is 2.04  m/s, and with an assumed right atrial pressure of 3 mmHg, the estimated right ventricular systolic pressure is 19.6 mmHg. Left Atrium: Left atrial size was normal in size. Right Atrium: Right atrial size was normal in size. Pericardium: There is no evidence of pericardial effusion. Presence of epicardial fat layer. Mitral Valve: The mitral valve  is grossly normal. Trivial mitral valve regurgitation. No evidence of mitral valve stenosis. MV peak gradient, 7.2 mmHg. The mean mitral valve gradient is 3.0 mmHg. Tricuspid Valve: The tricuspid valve is grossly normal. Tricuspid valve regurgitation is trivial. No evidence of tricuspid stenosis. The flow in the hepatic veins is normal during ventricular systole. Aortic Valve: The aortic valve is tricuspid. There is mild calcification of the aortic valve. There is mild thickening of the aortic valve. Aortic valve regurgitation is not visualized. Aortic valve sclerosis/calcification is present, without any evidence of aortic stenosis. Aortic valve mean gradient measures 9.0 mmHg. Aortic valve peak gradient measures 16.2 mmHg. Aortic valve area, by VTI measures 1.74 cm. Pulmonic Valve: The pulmonic valve was grossly normal. Pulmonic valve regurgitation is not visualized. No evidence of pulmonic stenosis. Aorta: The aortic root, ascending aorta and aortic arch are all structurally normal, with no evidence of dilitation or obstruction and the aortic root is normal in size and structure. Venous: The inferior vena cava is normal in size with greater than 50% respiratory variability, suggesting right atrial pressure of 3 mmHg. IAS/Shunts: The atrial septum is grossly normal.  LEFT VENTRICLE PLAX 2D LVIDd:         3.40 cm   Diastology LVIDs:         2.20 cm   LV e' medial:    4.57 cm/s LV PW:         0.80 cm   LV E/e' medial:  16.9 LV IVS:        0.80 cm   LV e' lateral:   4.62 cm/s LVOT diam:     1.70 cm   LV E/e' lateral: 16.7 LV SV:         58 LV SV Index:   36 LVOT Area:     2.27 cm  RIGHT VENTRICLE RV Basal diam:  3.40 cm RV S prime:     12.40 cm/s TAPSE (M-mode): 2.0 cm LEFT ATRIUM             Index       RIGHT ATRIUM          Index LA diam:        2.20 cm 1.38 cm/m  RA Area:     7.90 cm LA Vol (A2C):   14.7 ml 9.22 ml/m  RA Volume:   14.90 ml 9.35 ml/m LA Vol (A4C):   14.4 ml 9.00 ml/m LA Biplane Vol: 15.5 ml  9.72 ml/m  AORTIC VALVE AV Area (Vmax):    1.71 cm AV Area (Vmean):   1.67 cm AV Area (VTI):     1.74 cm AV Vmax:           201.00 cm/s AV Vmean:          148.000 cm/s AV VTI:            0.334 m AV Peak Grad:      16.2 mmHg AV Mean Grad:      9.0 mmHg LVOT Vmax:         151.00 cm/s LVOT Vmean:        109.000 cm/s LVOT VTI:          0.256 m LVOT/AV VTI ratio: 0.77  AORTA Ao Root diam: 2.80 cm MITRAL VALVE                TRICUSPID VALVE MV Area (PHT): 2.85 cm     TR Peak grad:   16.6 mmHg MV Area VTI:   2.55 cm     TR Vmax:        204.00 cm/s MV Peak grad:  7.2 mmHg MV Mean grad:  3.0 mmHg     SHUNTS MV Vmax:       1.34 m/s     Systemic VTI:  0.26 m MV Vmean:      87.4 cm/s    Systemic Diam: 1.70 cm MV Decel Time: 266 msec MV E velocity: 77.40 cm/s MV A velocity: 105.00 cm/s MV E/A ratio:  0.74 Lennie Odor MD Electronically signed by Lennie Odor MD Signature Date/Time: 02/20/2023/7:36:29 AM    Final    DG Abdomen 1 View  Result Date: 02/20/2023 CLINICAL DATA:  ng tube placement EXAM: ABDOMEN - 1 VIEW COMPARISON:  CT abdomen pelvis 02/19/2023 FINDINGS: Enteric tube courses below the hemidiaphragm with tip overlying the expected region of the gastroesophageal junction/proximal gastric lumen and side port overlying the expected region of the distal esophagus. Dilatation of several loops of small bowel. No radio-opaque calculi or other significant radiographic abnormality are seen. IMPRESSION: 1. Enteric tube courses below the hemidiaphragm with tip overlying the expected region of the gastroesophageal junction/proximal gastric lumen and side port overlying the expected region of the distal esophagus. Recommend advancing by 8 cm. 2. Small-bowel obstruction. Electronically Signed   By: Tish Frederickson M.D.   On: 02/20/2023 03:03   DG Chest Portable 1 View  Result Date: 02/20/2023 CLINICAL DATA:  Pain, vomiting EXAM: PORTABLE CHEST 1 VIEW COMPARISON:  02/19/2023 FINDINGS: The heart size and mediastinal  contours are within normal limits. Both lungs are clear. The visualized skeletal structures are unremarkable. IMPRESSION: Normal study. Electronically Signed   By: Charlett Nose M.D.   On: 02/20/2023 02:05   DG Chest Portable 1 View  Result Date: 02/19/2023 CLINICAL DATA:  NG tube advancement. EXAM: PORTABLE CHEST 1 VIEW  COMPARISON:  Radiographs 02/19/2023 at 9:39 p.m. FINDINGS: Similar position of the NG tube tip near the gastroesophageal junction and side-port in the distal esophagus. Recommend advancement approximately 10 cm to ensure side port is within the stomach. Remainder unchanged. IMPRESSION: Similar position of the NG tube in the distal esophagus. Recommend advancement approximately 10 cm to ensure side port is within the stomach. Electronically Signed   By: Minerva Fester M.D.   On: 02/19/2023 23:44   DG Chest Port 1 View  Result Date: 02/19/2023 CLINICAL DATA:  NG tube placement EXAM: PORTABLE CHEST 1 VIEW COMPARISON:  02/12/2023 FINDINGS: NG tube tip is in the distal esophagus. Heart mediastinal contours within normal limits. Visualized lungs clear. No effusions. Lung apices not visualized. IMPRESSION: NG tube tip in the distal esophagus. This could be advanced several cm into the stomach. Electronically Signed   By: Charlett Nose M.D.   On: 02/19/2023 22:14   CT ABDOMEN PELVIS W CONTRAST  Result Date: 02/19/2023 CLINICAL DATA:  Nausea and vomiting for 3 days.  Abdominal pain. EXAM: CT ABDOMEN AND PELVIS WITH CONTRAST TECHNIQUE: Multidetector CT imaging of the abdomen and pelvis was performed using the standard protocol following bolus administration of intravenous contrast. RADIATION DOSE REDUCTION: This exam was performed according to the departmental dose-optimization program which includes automated exposure control, adjustment of the mA and/or kV according to patient size and/or use of iterative reconstruction technique. CONTRAST:  75mL OMNIPAQUE IOHEXOL 300 MG/ML  SOLN COMPARISON:   07/07/2015 FINDINGS: Lower chest: No acute abnormality. Hepatobiliary: Respiratory motion mildly degrades detail in the upper abdomen. Unremarkable liver. Cholecystectomy. Prominent bile duct likely due to reservoir effect. Pancreas: Coarse calcifications in the pancreatic head due to sequela of chronic pancreatitis. No acute pancreatitis. No ductal dilation. Spleen: Unremarkable. Adrenals/Urinary Tract: Stable adrenal glands. No urinary calculi or hydronephrosis. Nondistended bladder about a Foley catheter. Stomach/Bowel: Stomach is within normal limits. Postoperative changes with neoterminal ileum. Marked dilation of the small bowel with air-fluid levels and transition point in the low posterior abdomen (series 2/image 50). Decompressed colon. Colonic diverticulosis without diverticulitis. Duodenal diverticulum. Vascular/Lymphatic: Aortic atherosclerosis. No enlarged abdominal or pelvic lymph nodes. Reproductive: Uterus and bilateral adnexa are unremarkable. Other: No free intraperitoneal fluid or air. 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 Musculoskeletal: Left THA.  No acute fracture. IMPRESSION: 1. Small-bowel obstruction with transition point in the low posterior abdomen. Aortic Atherosclerosis (ICD10-I70.0). Electronically Signed   By: Minerva Fester M.D.   On: 02/19/2023 18:04   CT Head Wo Contrast  Result Date: 02/19/2023 CLINICAL DATA:  Headache, sudden, severe.  Nausea and vomiting. EXAM: CT HEAD WITHOUT CONTRAST TECHNIQUE: Contiguous axial images were obtained from the base of the skull through the vertex without intravenous contrast. RADIATION DOSE REDUCTION: This exam was performed according to the departmental dose-optimization program which includes automated exposure control, adjustment of the mA and/or kV according to patient size and/or use of iterative reconstruction technique. COMPARISON:  CTA head and neck 02/09/2023.  MRI head 02/08/2023. FINDINGS: Brain: Hypodensity  associated with the posterior right MCA territory infarct has decreased compared to the prior CT with cortex currently only being subtly hypodense likely reflecting fogging phenomenon. No new infarct, acute hemorrhage, mass, or midline shift is evident. Small chronic left parieto-occipital cortical infarcts are again noted, and chronic small vessel ischemia is again noted involving the cerebral white matter bilaterally. Prominent extra-axial CSF over both cerebral convexities is unchanged and attributed to atrophy. Prominent extra-axial CSF in the posterior fossa overlying the  left greater than right cerebellar hemispheres is also unchanged. Vascular: Calcified atherosclerosis at the skull base. No hyperdense vessel. Skull: No acute fracture or suspicious osseous lesion. Sinuses/Orbits: Paranasal sinuses and mastoid air cells are clear. Unremarkable orbits. Other: None. IMPRESSION: 1. No evidence of acute intracranial abnormality. 2. Interval evolution of a subacute posterior right MCA territory infarct. 3. Chronic small vessel ischemia. Electronically Signed   By: Sebastian Ache M.D.   On: 02/19/2023 18:02        Scheduled Meds:  Chlorhexidine Gluconate Cloth  6 each Topical Daily   Continuous Infusions:  sodium chloride 10 mL/hr at 02/20/23 0607   dextrose 5 % and 0.9 % NaCl with KCl 20 mEq/L 75 mL/hr at 02/20/23 0330   piperacillin-tazobactam (ZOSYN)  IV 3.375 g (02/20/23 1319)   potassium chloride 10 mEq (02/20/23 1440)     LOS: 0 days    Time spent: 35 minutes    Dorcas Carrow, MD [facility]

## 2023-02-20 NOTE — ED Notes (Signed)
ED TO INPATIENT HANDOFF REPORT  ED Nurse Name and Phone #: Morrie Sheldon RN [phone]  S Name/Age/Gender [name] 77 y.o. female Room/Bed: TRAAC/TRAAC  Code Status   Code Status: Full Code  Home/SNF/Other Nursing Home Patient oriented to: self, place, and time Is this baseline? No   Triage Complete: Triage complete  Chief Complaint SBO (small bowel obstruction) (HCC) [K56.609] Small bowel obstruction (HCC) [K56.609]  Triage Note Pt sent by [facility] for n/v times 3 days. Pt has had multiple episodes of vomiting this morning with greenish bile color. Pt states headache and back pain.    Allergies Allergies  Allergen Reactions   Flagyl [Metronidazole Hcl] Other (See Comments)    "makes me feel whoozy and weird"   Other     Unknown antibiotic and unknown pain medications     Level of Care/Admitting Diagnosis ED Disposition     ED Disposition  Admit   Condition  --   Comment  Hospital Area: [facility] [[id number]]  Level of Care: Progressive [102]  Admit to Progressive based on following criteria: CARDIOVASCULAR & THORACIC of moderate stability with acute coronary syndrome symptoms/low risk myocardial infarction/hypertensive urgency/arrhythmias/heart failure potentially compromising stability and stable post cardiovascular intervention patients.  May place patient in observation at [facility] or [facility] if equivalent level of care is available:: Yes  Covid Evaluation: Asymptomatic - no recent exposure (last 10 days) testing not required  Diagnosis: Small bowel obstruction [facility]) [[id number]]  Admitting Physician: Frankey Shown [[id number]]  Attending Physician: Frankey Shown [[id number]]          B Medical/Surgery History Past Medical History:  Diagnosis Date   Anemia, unspecified    Arthritis    Crohn's disease (HCC)    Degenerative disc disease, lumbar    Hypertension    Sciatica of right side    Stroke [facility])    Ventral hernia     Past Surgical History:  Procedure Laterality Date   CAROTID ENDARTERECTOMY     2004   CHOLECYSTECTOMY     2008   ESOPHAGOGASTRODUODENOSCOPY N/A 02/24/2015   Procedure: ESOPHAGOGASTRODUODENOSCOPY (EGD);  Surgeon: Corbin Ade, MD;  Location: [facility] ENDO SUITE;  Service: Endoscopy;  Laterality: N/A;   HEMICOLECTOMY     right   HIP ARTHROPLASTY Left 04/09/2015   Procedure: INSERT PARTIAL HIP REPLACEMENT LEFT;  Surgeon: Vickki Hearing, MD;  Location: [facility] ORS;  Service: Orthopedics;  Laterality: Left;   INCISIONAL HERNIA REPAIR N/A 05/20/2013   Procedure: Sherald Hess HERNIORRHAPHY WITH MESH;  Surgeon: Dalia Heading, MD;  Location: [facility] ORS;  Service: General;  Laterality: N/A;   INCISIONAL HERNIA REPAIR N/A 05/19/2014   Procedure: RECURRENT HERNIA REPAIR INCISIONAL;  Surgeon: Dalia Heading, MD;  Location: [facility] ORS;  Service: General;  Laterality: N/A;   INSERTION OF MESH N/A 05/20/2013   Procedure: INSERTION OF MESH;  Surgeon: Dalia Heading, MD;  Location: [facility] ORS;  Service: General;  Laterality: N/A;   INSERTION OF MESH N/A 05/19/2014   Procedure: INSERTION OF MESH;  Surgeon: Dalia Heading, MD;  Location: [facility] ORS;  Service: General;  Laterality: N/A;   TUBAL LIGATION       A IV Location/Drains/Wounds Patient Lines/Drains/Airways Status     Active Line/Drains/Airways     Name Placement date Placement time Site Days   Peripheral IV 02/19/23 22 G Anterior;Distal;Right Wrist 02/19/23  1423  Wrist  1   Peripheral IV 02/20/23 20 G 1" Anterior;Left;Proximal Forearm 02/20/23  0034  Forearm  less than 1   NG/OG Vented/Dual Lumen 16 Fr. Right nare 60 cm 02/20/23  0242  Right nare  less than 1   Urethral Catheter Enid Derry RN Non-latex 14 Fr. 02/19/23  1446  Non-latex  1            Intake/Output Last 24 hours  Intake/Output Summary (Last 24 hours) at 02/20/2023 0243 Last data filed at 02/19/2023 1515 Gross per 24 hour  Intake --  Output 1925 ml  Net -1925 ml     Labs/Imaging Results for orders placed or performed during the hospital encounter of 02/19/23 (from the past 48 hour(s))  SARS Coronavirus 2 by RT PCR (hospital order, performed in [facility] hospital lab) *cepheid single result test* Anterior Nasal Swab     Status: None   Collection Time: 02/19/23  2:13 PM   Specimen: Anterior Nasal Swab  Result Value Ref Range   SARS Coronavirus 2 by RT PCR NEGATIVE NEGATIVE    Comment: (NOTE) SARS-CoV-2 target nucleic acids are NOT DETECTED.  The SARS-CoV-2 RNA is generally detectable in upper and lower respiratory specimens during the acute phase of infection. The lowest concentration of SARS-CoV-2 viral copies this assay can detect is 250 copies / mL. A negative result does not preclude SARS-CoV-2 infection and should not be used as the sole basis for treatment or other patient management decisions.  A negative result may occur with improper specimen collection / handling, submission of specimen other than nasopharyngeal swab, presence of viral mutation(s) within the areas targeted by this assay, and inadequate number of viral copies (<250 copies / mL). A negative result must be combined with clinical observations, patient history, and epidemiological information.  Fact Sheet for Patients:   RoadLapTop.co.za  Fact Sheet for Healthcare Providers: http://kim-miller.com/  This test is not yet approved or  cleared by the [place] FDA and has been authorized for detection and/or diagnosis of SARS-CoV-2 by FDA under an Emergency Use Authorization (EUA).  This EUA will remain in effect (meaning this test can be used) for the duration of the COVID-19 declaration under Section 564(b)(1) of the Act, 21 U.S.C. section 360bbb-3(b)(1), unless the authorization is terminated or revoked sooner.  Performed at [facility], [address] [place], [place] [address]   CBC with Differential     Status:  Abnormal   Collection Time: 02/19/23  2:15 PM  Result Value Ref Range   WBC 29.2 (H) 4.0 - 10.5 K/uL   RBC 2.73 (L) 3.87 - 5.11 MIL/uL   Hemoglobin 9.5 (L) 12.0 - 15.0 g/dL   HCT 52.8 (L) 41.3 - 24.4 %   MCV 102.9 (H) 80.0 - 100.0 fL   MCH 34.8 (H) 26.0 - 34.0 pg   MCHC 33.8 30.0 - 36.0 g/dL   RDW 01.0 27.2 - 53.6 %   Platelets 343 150 - 400 K/uL   nRBC 0.0 0.0 - 0.2 %   Neutrophils Relative % 92 %   Neutro Abs 26.8 (H) 1.7 - 7.7 K/uL   Lymphocytes Relative 4 %   Lymphs Abs 1.1 0.7 - 4.0 K/uL   Monocytes Relative 3 %   Monocytes Absolute 1.0 0.1 - 1.0 K/uL   Eosinophils Relative 0 %   Eosinophils Absolute 0.0 0.0 - 0.5 K/uL   Basophils Relative 0 %   Basophils Absolute 0.1 0.0 - 0.1 K/uL   WBC Morphology MORPHOLOGY UNREMARKABLE    Smear Review MORPHOLOGY UNREMARKABLE    Immature Granulocytes 1 %  Abs Immature Granulocytes 0.16 (H) 0.00 - 0.07 K/uL   Polychromasia PRESENT     Comment: Performed at [facility], [address], [place], [place] [address]  Comprehensive metabolic panel     Status: Abnormal   Collection Time: 02/19/23  2:15 PM  Result Value Ref Range   Sodium 136 135 - 145 mmol/L   Potassium 3.4 (L) 3.5 - 5.1 mmol/L   Chloride 93 (L) 98 - 111 mmol/L   CO2 22 22 - 32 mmol/L   Glucose, Bld 136 (H) 70 - 99 mg/dL    Comment: Glucose reference range applies only to samples taken after fasting for at least 8 hours.   BUN 51 (H) 8 - 23 mg/dL   Creatinine, Ser 1.91 (H) 0.44 - 1.00 mg/dL   Calcium 9.6 8.9 - 47.8 mg/dL   Total Protein 6.2 (L) 6.5 - 8.1 g/dL   Albumin 3.4 (L) 3.5 - 5.0 g/dL   AST 29 15 - 41 U/L   ALT 23 0 - 44 U/L   Alkaline Phosphatase 49 38 - 126 U/L   Total Bilirubin 1.0 0.3 - 1.2 mg/dL   GFR, Estimated 39 (L) >60 mL/min    Comment: (NOTE) Calculated using the CKD-EPI Creatinine Equation (2021)    Anion gap 21 (H) 5 - 15    Comment: Electrolytes repeated to confirm. Electrolytes repeated to confirm. Performed at [facility], [address] [place], [place] [address]   Lipase, blood     Status: Abnormal   Collection Time: 02/19/23  2:15 PM  Result Value Ref Range   Lipase 64 (H) 11 - 51 U/L    Comment: Performed at [facility], [address], [place], [place] [address]  I-stat chem 8, ED (not at [facility], [facility] or [facility])     Status: Abnormal   Collection Time: 02/19/23  2:24 PM  Result Value Ref Range   Sodium 135 135 - 145 mmol/L   Potassium 3.6 3.5 - 5.1 mmol/L   Chloride 96 (L) 98 - 111 mmol/L   BUN 47 (H) 8 - 23 mg/dL   Creatinine, Ser 5.78 (H) 0.44 - 1.00 mg/dL   Glucose, Bld 469 (H) 70 - 99 mg/dL    Comment: Glucose reference range applies only to samples taken after fasting for at least 8 hours.   Calcium, Ion 1.12 (L) 1.15 - 1.40 mmol/L   TCO2 29 22 - 32 mmol/L   Hemoglobin 10.2 (L) 12.0 - 15.0 g/dL   HCT 62.9 (L) 52.8 - 41.3 %  Urinalysis, Routine w reflex microscopic -Urine, Clean Catch     Status: Abnormal   Collection Time: 02/19/23  2:49 PM  Result Value Ref Range   Color, Urine YELLOW YELLOW   APPearance CLOUDY (A) CLEAR   Specific Gravity, Urine 1.014 1.005 - 1.030   pH 8.0 5.0 - 8.0   Glucose, UA NEGATIVE NEGATIVE mg/dL   Hgb urine dipstick MODERATE (A) NEGATIVE   Bilirubin Urine NEGATIVE NEGATIVE   Ketones, ur NEGATIVE NEGATIVE mg/dL   Protein, ur 30 (A) NEGATIVE mg/dL   Nitrite NEGATIVE NEGATIVE   Leukocytes,Ua LARGE (A) NEGATIVE   RBC / HPF 0-5 0 - 5 RBC/hpf   WBC, UA >50 0 - 5 WBC/hpf   Bacteria, UA RARE (A) NONE SEEN   Squamous Epithelial / HPF 0-5 0 - 5 /HPF    Comment: Performed at [facility], [address] [place], [place] [address]  Culture, blood (Routine X 2) w Reflex to  ID Panel     Status: None (Preliminary result)   Collection Time: 02/19/23  9:30 PM   Specimen: BLOOD  Result Value Ref Range   Specimen Description BLOOD BLOOD RIGHT HAND    Special Requests      BOTTLES DRAWN AEROBIC ONLY Blood Culture results may not be optimal due to an inadequate volume of blood received in  culture bottles Performed at [facility], [address] [place], [place] [address]    Culture PENDING    Report Status PENDING   Lactic acid, plasma     Status: None   Collection Time: 02/19/23  9:39 PM  Result Value Ref Range   Lactic Acid, Venous 1.3 0.5 - 1.9 mmol/L    Comment: Performed at [facility], [address] [place], [place] [address]  CBG monitoring, ED     Status: Abnormal   Collection Time: 02/19/23 10:44 PM  Result Value Ref Range   Glucose-Capillary 109 (H) 70 - 99 mg/dL    Comment: Glucose reference range applies only to samples taken after fasting for at least 8 hours.  Culture, blood (Routine X 2) w Reflex to ID Panel     Status: None (Preliminary result)   Collection Time: 02/19/23 11:19 PM   Specimen: BLOOD  Result Value Ref Range   Specimen Description BLOOD LEFT ANTECUBITAL    Special Requests      BOTTLES DRAWN AEROBIC AND ANAEROBIC Blood Culture results may not be optimal due to an excessive volume of blood received in culture bottles Performed at [facility], [address] [place], [place] [address]    Culture PENDING    Report Status PENDING   Troponin I (High Sensitivity)     Status: Abnormal   Collection Time: 02/19/23 11:19 PM  Result Value Ref Range   Troponin I (High Sensitivity) 127 (HH) <18 ng/L    Comment: CRITICAL RESULT CALLED TO, READ BACK BY AND VERIFIED WITH BAILEYO., RN 02/20/23 0024 VIRAY,J (NOTE) Elevated high sensitivity troponin I (hsTnI) values and significant  changes across serial measurements may suggest ACS but many other  chronic and acute conditions are known to elevate hsTnI results.  Refer to the "Links" section for chest pain algorithms and additional  guidance. Performed at [facility], [address] [place], [place] [address]   CBG monitoring, ED     Status: Abnormal   Collection Time: 02/20/23  1:35 AM  Result Value Ref Range   Glucose-Capillary 106 (H) 70 - 99 mg/dL    Comment: Glucose reference range applies  only to samples taken after fasting for at least 8 hours.  I-Stat CG4 Lactic Acid     Status: None   Collection Time: 02/20/23  1:49 AM  Result Value Ref Range   Lactic Acid, Venous 1.1 0.5 - 1.9 mmol/L  I-stat chem 8, ED (not at [facility], [facility] or [facility])     Status: Abnormal   Collection Time: 02/20/23  1:50 AM  Result Value Ref Range   Sodium 141 135 - 145 mmol/L   Potassium 2.7 (LL) 3.5 - 5.1 mmol/L   Chloride 103 98 - 111 mmol/L   BUN 39 (H) 8 - 23 mg/dL   Creatinine, Ser 4.25 (H) 0.44 - 1.00 mg/dL   Glucose, Bld 956 (H) 70 - 99 mg/dL    Comment: Glucose reference range applies only to samples taken after fasting for at least 8 hours.   Calcium, Ion 1.16 1.15 - 1.40 mmol/L   TCO2 25 22 -  32 mmol/L   Hemoglobin 7.5 (L) 12.0 - 15.0 g/dL   HCT 16.1 (L) 09.6 - 04.5 %   Comment NOTIFIED PHYSICIAN    DG Chest Portable 1 View  Result Date: 02/20/2023 CLINICAL DATA:  Pain, vomiting EXAM: PORTABLE CHEST 1 VIEW COMPARISON:  02/19/2023 FINDINGS: The heart size and mediastinal contours are within normal limits. Both lungs are clear. The visualized skeletal structures are unremarkable. IMPRESSION: Normal study. Electronically Signed   By: Charlett Nose M.D.   On: 02/20/2023 02:05   DG Chest Portable 1 View  Result Date: 02/19/2023 CLINICAL DATA:  NG tube advancement. EXAM: PORTABLE CHEST 1 VIEW COMPARISON:  Radiographs 02/19/2023 at 9:39 p.m. FINDINGS: Similar position of the NG tube tip near the gastroesophageal junction and side-port in the distal esophagus. Recommend advancement approximately 10 cm to ensure side port is within the stomach. Remainder unchanged. IMPRESSION: Similar position of the NG tube in the distal esophagus. Recommend advancement approximately 10 cm to ensure side port is within the stomach. Electronically Signed   By: Minerva Fester M.D.   On: 02/19/2023 23:44   DG Chest Port 1 View  Result Date: 02/19/2023 CLINICAL DATA:  NG tube placement EXAM: PORTABLE CHEST 1 VIEW COMPARISON:   02/12/2023 FINDINGS: NG tube tip is in the distal esophagus. Heart mediastinal contours within normal limits. Visualized lungs clear. No effusions. Lung apices not visualized. IMPRESSION: NG tube tip in the distal esophagus. This could be advanced several cm into the stomach. Electronically Signed   By: Charlett Nose M.D.   On: 02/19/2023 22:14   CT ABDOMEN PELVIS W CONTRAST  Result Date: 02/19/2023 CLINICAL DATA:  Nausea and vomiting for 3 days.  Abdominal pain. EXAM: CT ABDOMEN AND PELVIS WITH CONTRAST TECHNIQUE: Multidetector CT imaging of the abdomen and pelvis was performed using the standard protocol following bolus administration of intravenous contrast. RADIATION DOSE REDUCTION: This exam was performed according to the departmental dose-optimization program which includes automated exposure control, adjustment of the mA and/or kV according to patient size and/or use of iterative reconstruction technique. CONTRAST:  75mL OMNIPAQUE IOHEXOL 300 MG/ML  SOLN COMPARISON:  07/07/2015 FINDINGS: Lower chest: No acute abnormality. Hepatobiliary: Respiratory motion mildly degrades detail in the upper abdomen. Unremarkable liver. Cholecystectomy. Prominent bile duct likely due to reservoir effect. Pancreas: Coarse calcifications in the pancreatic head due to sequela of chronic pancreatitis. No acute pancreatitis. No ductal dilation. Spleen: Unremarkable. Adrenals/Urinary Tract: Stable adrenal glands. No urinary calculi or hydronephrosis. Nondistended bladder about a Foley catheter. Stomach/Bowel: Stomach is within normal limits. Postoperative changes with neoterminal ileum. Marked dilation of the small bowel with air-fluid levels and transition point in the low posterior abdomen (series 2/image 50). Decompressed colon. Colonic diverticulosis without diverticulitis. Duodenal diverticulum. Vascular/Lymphatic: Aortic atherosclerosis. No enlarged abdominal or pelvic lymph nodes. Reproductive: Uterus and bilateral adnexa  are unremarkable. Other: No free intraperitoneal fluid or air. 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 Musculoskeletal: Left THA.  No acute fracture. IMPRESSION: 1. Small-bowel obstruction with transition point in the low posterior abdomen. Aortic Atherosclerosis (ICD10-I70.0). Electronically Signed   By: Minerva Fester M.D.   On: 02/19/2023 18:04   CT Head Wo Contrast  Result Date: 02/19/2023 CLINICAL DATA:  Headache, sudden, severe.  Nausea and vomiting. EXAM: CT HEAD WITHOUT CONTRAST TECHNIQUE: Contiguous axial images were obtained from the base of the skull through the vertex without intravenous contrast. RADIATION DOSE REDUCTION: This exam was performed according to the departmental dose-optimization program which includes automated exposure control, adjustment of  the mA and/or kV according to patient size and/or use of iterative reconstruction technique. COMPARISON:  CTA head and neck 02/09/2023.  MRI head 02/08/2023. FINDINGS: Brain: Hypodensity associated with the posterior right MCA territory infarct has decreased compared to the prior CT with cortex currently only being subtly hypodense likely reflecting fogging phenomenon. No new infarct, acute hemorrhage, mass, or midline shift is evident. Small chronic left parieto-occipital cortical infarcts are again noted, and chronic small vessel ischemia is again noted involving the cerebral white matter bilaterally. Prominent extra-axial CSF over both cerebral convexities is unchanged and attributed to atrophy. Prominent extra-axial CSF in the posterior fossa overlying the left greater than right cerebellar hemispheres is also unchanged. Vascular: Calcified atherosclerosis at the skull base. No hyperdense vessel. Skull: No acute fracture or suspicious osseous lesion. Sinuses/Orbits: Paranasal sinuses and mastoid air cells are clear. Unremarkable orbits. Other: None. IMPRESSION: 1. No evidence of acute intracranial abnormality. 2. Interval evolution  of a subacute posterior right MCA territory infarct. 3. Chronic small vessel ischemia. Electronically Signed   By: Sebastian Ache M.D.   On: 02/19/2023 18:02    Pending Labs Unresulted Labs (From admission, onward)     Start     Ordered   02/19/23 2130  Urine Culture (for pregnant, neutropenic or urologic patients or patients with an indwelling urinary catheter)  (Urine Labs)  Add-on,   AD       Question:  Indication  Answer:  Sepsis   02/19/23 2130   Signed and Held  Basic metabolic panel  Tomorrow morning,   R        Signed and Held   Signed and Held  CBC  Tomorrow morning,   R        Signed and Held            Vitals/Pain Today's Vitals   02/19/23 2334 02/19/23 2335 02/20/23 0126 02/20/23 0200  BP:   128/63 131/65  Pulse:   (!) 107 99  Resp:   (!) 21 (!) 21  Temp:   97.6 F (36.4 C)   TempSrc:   Oral   SpO2:   99% 100%  Weight:      Height:      PainSc: 2  2       Isolation Precautions No active isolations  Medications Medications  piperacillin-tazobactam (ZOSYN) IVPB 3.375 g (0 g Intravenous Stopped 02/20/23 0123)  morphine (PF) 2 MG/ML injection 2 mg (2 mg Intravenous Given 02/19/23 2231)  dextrose 5 % and 0.9 % NaCl with KCl 20 mEq/L infusion ( Intravenous New Bag/Given 02/20/23 0130)  Chlorhexidine Gluconate Cloth 2 % PADS 6 each (has no administration in time range)  ondansetron (ZOFRAN) injection 4 mg (4 mg Intravenous Given 02/19/23 1425)  fentaNYL (SUBLIMAZE) injection 50 mcg (50 mcg Intravenous Given 02/19/23 1502)  sodium chloride 0.9 % bolus 1,000 mL (0 mLs Intravenous Stopped 02/19/23 1630)  piperacillin-tazobactam (ZOSYN) IVPB 3.375 g (0 g Intravenous Stopped 02/19/23 1630)  iohexol (OMNIPAQUE) 300 MG/ML solution 75 mL (75 mLs Intravenous Contrast Given 02/19/23 1606)  ondansetron (ZOFRAN) injection 4 mg (4 mg Intravenous Given 02/19/23 1804)  lactated ringers bolus 1,000 mL (0 mLs Intravenous Stopped 02/20/23 0123)  aspirin suppository 300 mg (300 mg Rectal Given 02/20/23  0030)  sodium chloride 0.9 % bolus 1,000 mL (1,000 mLs Intravenous New Bag/Given 02/20/23 0124)    Mobility non-ambulatory at this time     Focused Assessments Neuro Assessment Handoff:  Swallow screen pass? Yes  Cardiac Rhythm:  Sinus tachycardia       Neuro Assessment: Exceptions to WDL Neuro Checks:      Has TPA been given? No If patient is a Neuro Trauma and patient is going to OR before floor call report to 4N Charge nurse: [phone] or [phone]   R Recommendations: See Admitting Provider Note  Report given to:   Additional Notes:  '

## 2023-02-20 NOTE — ED Provider Notes (Signed)
Patient presents in transfer from [facility]. Patient was originally to be admitted for a small bowel obstruction.  However EKG was performed at that time was concerning for STEMI versus pericarditis.  It was recommended that she get transferred to this hospital be seen by cardiology.  On arrival patient is somnolent but arousable.  She is tachycardic.  She has significant diffuse abdominal tenderness with decreased bowel sounds. Strong suspicion that her underlying obstruction is worsening.  Cardiology Dr. Daphine Deutscher has been made aware and plans for stat echo  Will consult general surgery Patient is a full code   Zadie Rhine, MD 02/20/23 [phone]

## 2023-02-20 NOTE — ED Provider Notes (Signed)
Discussed with general surgery who will see the patient   Zadie Rhine, MD 02/20/23 0139

## 2023-02-20 NOTE — Progress Notes (Signed)
Echo preliminary findings--on-call cardiology  Normal LV size and wall thickness LVEF>60% Grade I DD, appropriate for pt age Normal RV size/fxn Normal atria Trileaflet AV w/ mild calcification/sclerosis but no stenosis or AI Mildly thickened MV leaflets w/ tr MR Tr TR No PI No pericardial effusion IVC normal  Full report to follow Precious Reel, MD , [facility] 02/20/23 2:55 AM

## 2023-02-20 NOTE — Progress Notes (Signed)
Back on suction after being clamped for contrast

## 2023-02-20 NOTE — ED Provider Notes (Signed)
Patient seen by cardiology Dr. Daphine Deutscher.  Preliminary read of echocardiogram is unremarkable.  She does not recommend anticoagulation at this time unless troponin becomes significantly elevated.  Discussed with Dr. Haroldine Laws for admission to [facility] hospitalist   Zadie Rhine, MD 02/20/23 0236

## 2023-02-20 NOTE — Progress Notes (Signed)
  Echocardiogram 2D Echocardiogram has been performed.  [name] 02/20/2023, 2:48 AM

## 2023-02-21 DIAGNOSIS — Z7189 Other specified counseling: Secondary | ICD-10-CM | POA: Diagnosis not present

## 2023-02-21 DIAGNOSIS — K56609 Unspecified intestinal obstruction, unspecified as to partial versus complete obstruction: Secondary | ICD-10-CM | POA: Diagnosis not present

## 2023-02-21 DIAGNOSIS — N3001 Acute cystitis with hematuria: Secondary | ICD-10-CM | POA: Diagnosis not present

## 2023-02-21 DIAGNOSIS — R9431 Abnormal electrocardiogram [ECG] [EKG]: Secondary | ICD-10-CM | POA: Diagnosis not present

## 2023-02-21 LAB — PHOSPHORUS: Phosphorus: 1.5 mg/dL — ABNORMAL LOW (ref 2.5–4.6)

## 2023-02-21 LAB — BASIC METABOLIC PANEL
Anion gap: 7 (ref 5–15)
BUN: 20 mg/dL (ref 8–23)
CO2: 24 mmol/L (ref 22–32)
Calcium: 8 mg/dL — ABNORMAL LOW (ref 8.9–10.3)
Chloride: 112 mmol/L — ABNORMAL HIGH (ref 98–111)
Creatinine, Ser: 0.77 mg/dL (ref 0.44–1.00)
GFR, Estimated: >60 mL/min (ref >60)
Glucose, Bld: 140 mg/dL — ABNORMAL HIGH (ref 70–99)
Potassium: 2.9 mmol/L — ABNORMAL LOW (ref 3.5–5.1)
Sodium: 143 mmol/L (ref 135–145)

## 2023-02-21 LAB — CBC WITH DIFFERENTIAL/PLATELET
Abs Immature Granulocytes: 0.05 10*3/uL (ref 0.00–0.07)
Basophils Absolute: 0 10*3/uL (ref 0.0–0.1)
Basophils Relative: 0 %
Eosinophils Absolute: 0.4 10*3/uL (ref 0.0–0.5)
Eosinophils Relative: 3 %
HCT: 22.6 % — ABNORMAL LOW (ref 36.0–46.0)
Hemoglobin: 6.8 g/dL — CL (ref 12.0–15.0)
Immature Granulocytes: 0 %
Lymphocytes Relative: 7 %
Lymphs Abs: 0.7 10*3/uL (ref 0.7–4.0)
MCH: 33 pg (ref 26.0–34.0)
MCHC: 30.1 g/dL (ref 30.0–36.0)
MCV: 109.7 fL — ABNORMAL HIGH (ref 80.0–100.0)
Monocytes Absolute: 0.7 10*3/uL (ref 0.1–1.0)
Monocytes Relative: 6 %
Neutro Abs: 9.4 10*3/uL — ABNORMAL HIGH (ref 1.7–7.7)
Neutrophils Relative %: 84 %
Platelets: 214 10*3/uL (ref 150–400)
RBC: 2.06 MIL/uL — ABNORMAL LOW (ref 3.87–5.11)
RDW: 13.6 % (ref 11.5–15.5)
WBC: 11.2 10*3/uL — ABNORMAL HIGH (ref 4.0–10.5)
nRBC: 0 % (ref 0.0–0.2)

## 2023-02-21 LAB — PREPARE RBC (CROSSMATCH)

## 2023-02-21 LAB — GLUCOSE, CAPILLARY: Glucose-Capillary: 138 mg/dL — ABNORMAL HIGH (ref 70–99)

## 2023-02-21 LAB — MAGNESIUM: Magnesium: 1.7 mg/dL (ref 1.7–2.4)

## 2023-02-21 MED ORDER — PANTOPRAZOLE SODIUM 40 MG IV SOLR
40.0000 mg | Freq: Two times a day (BID) | INTRAVENOUS | Status: DC
  Administered 2023-02-21 – 2023-02-24 (×7): 40 mg via INTRAVENOUS
  Filled 2023-02-21 (×7): qty 10

## 2023-02-21 MED ORDER — SODIUM CHLORIDE 0.9% IV SOLUTION: Freq: Once | INTRAVENOUS | Status: AC

## 2023-02-21 MED ORDER — POTASSIUM CHLORIDE 10 MEQ/100ML IV SOLN
10.0000 meq | INTRAVENOUS | Status: AC
  Administered 2023-02-21 (×4): 10 meq via INTRAVENOUS
  Filled 2023-02-21 (×4): qty 100

## 2023-02-21 NOTE — Progress Notes (Signed)
Progress Note  Patient Name: [name] Date of Encounter: 02/21/2023  Primary Cardiologist:   None   Subjective   No chest pain.  She is confused.  No obvious SOB.   Inpatient Medications    Scheduled Meds:  Chlorhexidine Gluconate Cloth  6 each Topical Daily   pantoprazole (PROTONIX) IV  40 mg Intravenous Q12H   Continuous Infusions:  sodium chloride Stopped (02/20/23 1319)   dextrose 5 % and 0.9 % NaCl with KCl 20 mEq/L 75 mL/hr at 02/21/23 0813   piperacillin-tazobactam (ZOSYN)  IV 3.375 g (02/21/23 0549)   potassium chloride 10 mEq (02/21/23 0954)   PRN Meds: sodium chloride, acetaminophen **OR** acetaminophen, magic mouthwash, morphine injection, ondansetron **OR** ondansetron (ZOFRAN) IV   Vital Signs    Vitals:   02/21/23 0930 02/21/23 0935 02/21/23 0944 02/21/23 0945  BP: (!) 141/77 (!) 141/77 131/65 131/65  Pulse: 87 90 92 90  Resp: 14 (!) 93 16 (!) 21  Temp: (!) 97.5 F (36.4 C)   (!) 97.5 F (36.4 C)  TempSrc: Axillary     SpO2: 100% 100% 100% 100%  Weight:      Height:        Intake/Output Summary (Last 24 hours) at 02/21/2023 1026 Last data filed at 02/21/2023 4401 Gross per 24 hour  Intake 2145.45 ml  Output 875 ml  Net 1270.45 ml   Filed Weights   02/19/23 1403 02/20/23 0318  Weight: 54.6 kg 52.1 kg    Telemetry    NSR - Personally Reviewed  ECG    NA - Personally Reviewed  Physical Exam   GEN:   Very frail and chronically ill appearing Neck: No  JVD Cardiac: RRR, no murmurs, rubs, or gallops.  Respiratory: Clear  to auscultation bilaterally. GI: Soft, nontender, non-distended  MS: No  edema; No deformity. Neuro:  Nonfocal  Psych:  Confused.   Labs    Chemistry Recent Labs  Lab 02/19/23 1415 02/19/23 1424 02/20/23 0150 02/20/23 0642 02/21/23 0414  NA 136   < > 141 141 143  K 3.4*   < > 2.7* 2.6* 2.9*  CL 93*   < > 103 106 112*  CO2 22  --   --  24 24  GLUCOSE 136*   < > 109* 126* 140*  BUN 51*   < > 39* 36* 20   CREATININE 1.38*   < > 1.10* 1.02* 0.77  CALCIUM 9.6  --   --  8.5* 8.0*  PROT 6.2*  --   --   --   --   ALBUMIN 3.4*  --   --   --   --   AST 29  --   --   --   --   ALT 23  --   --   --   --   ALKPHOS 49  --   --   --   --   BILITOT 1.0  --   --   --   --   GFRNONAA 39*  --   --  56* >60  ANIONGAP 21*  --   --  11 7   < > = values in this interval not displayed.     Hematology Recent Labs  Lab 02/19/23 1415 02/19/23 1424 02/20/23 0150 02/20/23 0642 02/21/23 0414  WBC 29.2*  --   --  20.2* 11.2*  RBC 2.73*  --   --  2.21* 2.06*  HGB 9.5*   < >  7.5* 7.6* 6.8*  HCT 28.1*   < > 22.0* 23.9* 22.6*  MCV 102.9*  --   --  108.1* 109.7*  MCH 34.8*  --   --  34.4* 33.0  MCHC 33.8  --   --  31.8 30.1  RDW 13.6  --   --  13.7 13.6  PLT 343  --   --  220 214   < > = values in this interval not displayed.    Cardiac EnzymesNo results for input(s): "TROPONINI" in the last 168 hours. No results for input(s): "TROPIPOC" in the last 168 hours.   BNPNo results for input(s): "BNP", "PROBNP" in the last 168 hours.   DDimer No results for input(s): "DDIMER" in the last 168 hours.   Radiology    DG Abd Portable 1V-Small Bowel Obstruction Protocol-initial, 8 hr delay  Result Date: 02/20/2023 CLINICAL DATA:  SBO 8 hour delay EXAM: PORTABLE ABDOMEN - 1 VIEW COMPARISON:  02/20/2023, CT 02/19/2023 FINDINGS: Esophageal tube tip at the distal esophagus. Mild central small bowel distension. Enteral contrast appears within the colon. Partially visualized left hip replacement IMPRESSION: 1. Esophageal tube tip at the distal esophageal region 2. Enteral contrast visible in the colon. Mild residual small bowel distension but probably decreased compared to prior radiograph Electronically Signed   By: Jasmine Pang M.D.   On: 02/20/2023 21:24   ECHOCARDIOGRAM COMPLETE  Result Date: 02/20/2023    ECHOCARDIOGRAM REPORT   Patient Name:   [name] Date of Exam: 02/20/2023 Medical Rec #:  [id number]     Height:       65.0 in Accession #:    [id number]   Weight:       120.4 lb Date of Birth:  [birth year]    BSA:          1.594 m Patient Age:    62 years     BP:           131/65 mmHg Patient Gender: F            HR:           97 bpm. Exam Location:  Inpatient Procedure: 2D Echo, Color Doppler and Cardiac Doppler Indications:    Abnormal ECG  History:        Patient has prior history of Echocardiogram examinations, most                 recent 02/08/2023. Stroke; Risk Factors:Hypertension and Current                 Smoker.  Sonographer:    Milda Smart Referring Phys: [id number] DONALD WICKLINE  Sonographer Comments: Image acquisition challenging due to patient body habitus and Image acquisition challenging due to respiratory motion. IMPRESSIONS  1. Left ventricular ejection fraction, by estimation, is 65 to 70%. The left ventricle has normal function. The left ventricle has no regional wall motion abnormalities. Left ventricular diastolic parameters are consistent with Grade I diastolic dysfunction (impaired relaxation).  2. Right ventricular systolic function is normal. The right ventricular size is mildly enlarged. There is normal pulmonary artery systolic pressure. The estimated right ventricular systolic pressure is 19.6 mmHg.  3. The mitral valve is grossly normal. Trivial mitral valve regurgitation. No evidence of mitral stenosis.  4. The aortic valve is tricuspid. There is mild calcification of the aortic valve. There is mild thickening of the aortic valve. Aortic valve regurgitation is not visualized. Aortic valve sclerosis/calcification is present, without any evidence of aortic  stenosis. Aortic valve area, by VTI measures 1.74 cm.  5. The inferior vena cava is normal in size with greater than 50% respiratory variability, suggesting right atrial pressure of 3 mmHg. Comparison(s): No significant change from prior study. FINDINGS  Left Ventricle: Left ventricular ejection fraction, by estimation, is 65 to 70%. The  left ventricle has normal function. The left ventricle has no regional wall motion abnormalities. The left ventricular internal cavity size was normal in size. There is  no left ventricular hypertrophy. Left ventricular diastolic parameters are consistent with Grade I diastolic dysfunction (impaired relaxation). Right Ventricle: The right ventricular size is mildly enlarged. No increase in right ventricular wall thickness. Right ventricular systolic function is normal. There is normal pulmonary artery systolic pressure. The tricuspid regurgitant velocity is 2.04  m/s, and with an assumed right atrial pressure of 3 mmHg, the estimated right ventricular systolic pressure is 19.6 mmHg. Left Atrium: Left atrial size was normal in size. Right Atrium: Right atrial size was normal in size. Pericardium: There is no evidence of pericardial effusion. Presence of epicardial fat layer. Mitral Valve: The mitral valve is grossly normal. Trivial mitral valve regurgitation. No evidence of mitral valve stenosis. MV peak gradient, 7.2 mmHg. The mean mitral valve gradient is 3.0 mmHg. Tricuspid Valve: The tricuspid valve is grossly normal. Tricuspid valve regurgitation is trivial. No evidence of tricuspid stenosis. The flow in the hepatic veins is normal during ventricular systole. Aortic Valve: The aortic valve is tricuspid. There is mild calcification of the aortic valve. There is mild thickening of the aortic valve. Aortic valve regurgitation is not visualized. Aortic valve sclerosis/calcification is present, without any evidence of aortic stenosis. Aortic valve mean gradient measures 9.0 mmHg. Aortic valve peak gradient measures 16.2 mmHg. Aortic valve area, by VTI measures 1.74 cm. Pulmonic Valve: The pulmonic valve was grossly normal. Pulmonic valve regurgitation is not visualized. No evidence of pulmonic stenosis. Aorta: The aortic root, ascending aorta and aortic arch are all structurally normal, with no evidence of  dilitation or obstruction and the aortic root is normal in size and structure. Venous: The inferior vena cava is normal in size with greater than 50% respiratory variability, suggesting right atrial pressure of 3 mmHg. IAS/Shunts: The atrial septum is grossly normal.  LEFT VENTRICLE PLAX 2D LVIDd:         3.40 cm   Diastology LVIDs:         2.20 cm   LV e' medial:    4.57 cm/s LV PW:         0.80 cm   LV E/e' medial:  16.9 LV IVS:        0.80 cm   LV e' lateral:   4.62 cm/s LVOT diam:     1.70 cm   LV E/e' lateral: 16.7 LV SV:         58 LV SV Index:   36 LVOT Area:     2.27 cm  RIGHT VENTRICLE RV Basal diam:  3.40 cm RV S prime:     12.40 cm/s TAPSE (M-mode): 2.0 cm LEFT ATRIUM             Index       RIGHT ATRIUM          Index LA diam:        2.20 cm 1.38 cm/m  RA Area:     7.90 cm LA Vol (A2C):   14.7 ml 9.22 ml/m  RA Volume:   14.90 ml 9.35 ml/m  LA Vol (A4C):   14.4 ml 9.00 ml/m LA Biplane Vol: 15.5 ml 9.72 ml/m  AORTIC VALVE AV Area (Vmax):    1.71 cm AV Area (Vmean):   1.67 cm AV Area (VTI):     1.74 cm AV Vmax:           201.00 cm/s AV Vmean:          148.000 cm/s AV VTI:            0.334 m AV Peak Grad:      16.2 mmHg AV Mean Grad:      9.0 mmHg LVOT Vmax:         151.00 cm/s LVOT Vmean:        109.000 cm/s LVOT VTI:          0.256 m LVOT/AV VTI ratio: 0.77  AORTA Ao Root diam: 2.80 cm MITRAL VALVE                TRICUSPID VALVE MV Area (PHT): 2.85 cm     TR Peak grad:   16.6 mmHg MV Area VTI:   2.55 cm     TR Vmax:        204.00 cm/s MV Peak grad:  7.2 mmHg MV Mean grad:  3.0 mmHg     SHUNTS MV Vmax:       1.34 m/s     Systemic VTI:  0.26 m MV Vmean:      87.4 cm/s    Systemic Diam: 1.70 cm MV Decel Time: 266 msec MV E velocity: 77.40 cm/s MV A velocity: 105.00 cm/s MV E/A ratio:  0.74 Lennie Odor MD Electronically signed by Lennie Odor MD Signature Date/Time: 02/20/2023/7:36:29 AM    Final    DG Abdomen 1 View  Result Date: 02/20/2023 CLINICAL DATA:  ng tube placement EXAM: ABDOMEN - 1  VIEW COMPARISON:  CT abdomen pelvis 02/19/2023 FINDINGS: Enteric tube courses below the hemidiaphragm with tip overlying the expected region of the gastroesophageal junction/proximal gastric lumen and side port overlying the expected region of the distal esophagus. Dilatation of several loops of small bowel. No radio-opaque calculi or other significant radiographic abnormality are seen. IMPRESSION: 1. Enteric tube courses below the hemidiaphragm with tip overlying the expected region of the gastroesophageal junction/proximal gastric lumen and side port overlying the expected region of the distal esophagus. Recommend advancing by 8 cm. 2. Small-bowel obstruction. Electronically Signed   By: Tish Frederickson M.D.   On: 02/20/2023 03:03   DG Chest Portable 1 View  Result Date: 02/20/2023 CLINICAL DATA:  Pain, vomiting EXAM: PORTABLE CHEST 1 VIEW COMPARISON:  02/19/2023 FINDINGS: The heart size and mediastinal contours are within normal limits. Both lungs are clear. The visualized skeletal structures are unremarkable. IMPRESSION: Normal study. Electronically Signed   By: Charlett Nose M.D.   On: 02/20/2023 02:05   DG Chest Portable 1 View  Result Date: 02/19/2023 CLINICAL DATA:  NG tube advancement. EXAM: PORTABLE CHEST 1 VIEW COMPARISON:  Radiographs 02/19/2023 at 9:39 p.m. FINDINGS: Similar position of the NG tube tip near the gastroesophageal junction and side-port in the distal esophagus. Recommend advancement approximately 10 cm to ensure side port is within the stomach. Remainder unchanged. IMPRESSION: Similar position of the NG tube in the distal esophagus. Recommend advancement approximately 10 cm to ensure side port is within the stomach. Electronically Signed   By: Minerva Fester M.D.   On: 02/19/2023 23:44   DG Chest Port 1 View  Result Date: 02/19/2023  CLINICAL DATA:  NG tube placement EXAM: PORTABLE CHEST 1 VIEW COMPARISON:  02/12/2023 FINDINGS: NG tube tip is in the distal esophagus. Heart  mediastinal contours within normal limits. Visualized lungs clear. No effusions. Lung apices not visualized. IMPRESSION: NG tube tip in the distal esophagus. This could be advanced several cm into the stomach. Electronically Signed   By: Charlett Nose M.D.   On: 02/19/2023 22:14   CT ABDOMEN PELVIS W CONTRAST  Result Date: 02/19/2023 CLINICAL DATA:  Nausea and vomiting for 3 days.  Abdominal pain. EXAM: CT ABDOMEN AND PELVIS WITH CONTRAST TECHNIQUE: Multidetector CT imaging of the abdomen and pelvis was performed using the standard protocol following bolus administration of intravenous contrast. RADIATION DOSE REDUCTION: This exam was performed according to the departmental dose-optimization program which includes automated exposure control, adjustment of the mA and/or kV according to patient size and/or use of iterative reconstruction technique. CONTRAST:  75mL OMNIPAQUE IOHEXOL 300 MG/ML  SOLN COMPARISON:  07/07/2015 FINDINGS: Lower chest: No acute abnormality. Hepatobiliary: Respiratory motion mildly degrades detail in the upper abdomen. Unremarkable liver. Cholecystectomy. Prominent bile duct likely due to reservoir effect. Pancreas: Coarse calcifications in the pancreatic head due to sequela of chronic pancreatitis. No acute pancreatitis. No ductal dilation. Spleen: Unremarkable. Adrenals/Urinary Tract: Stable adrenal glands. No urinary calculi or hydronephrosis. Nondistended bladder about a Foley catheter. Stomach/Bowel: Stomach is within normal limits. Postoperative changes with neoterminal ileum. Marked dilation of the small bowel with air-fluid levels and transition point in the low posterior abdomen (series 2/image 50). Decompressed colon. Colonic diverticulosis without diverticulitis. Duodenal diverticulum. Vascular/Lymphatic: Aortic atherosclerosis. No enlarged abdominal or pelvic lymph nodes. Reproductive: Uterus and bilateral adnexa are unremarkable. Other: No free intraperitoneal fluid or air.  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 Musculoskeletal: Left THA.  No acute fracture. IMPRESSION: 1. Small-bowel obstruction with transition point in the low posterior abdomen. Aortic Atherosclerosis (ICD10-I70.0). Electronically Signed   By: Minerva Fester M.D.   On: 02/19/2023 18:04   CT Head Wo Contrast  Result Date: 02/19/2023 CLINICAL DATA:  Headache, sudden, severe.  Nausea and vomiting. EXAM: CT HEAD WITHOUT CONTRAST TECHNIQUE: Contiguous axial images were obtained from the base of the skull through the vertex without intravenous contrast. RADIATION DOSE REDUCTION: This exam was performed according to the departmental dose-optimization program which includes automated exposure control, adjustment of the mA and/or kV according to patient size and/or use of iterative reconstruction technique. COMPARISON:  CTA head and neck 02/09/2023.  MRI head 02/08/2023. FINDINGS: Brain: Hypodensity associated with the posterior right MCA territory infarct has decreased compared to the prior CT with cortex currently only being subtly hypodense likely reflecting fogging phenomenon. No new infarct, acute hemorrhage, mass, or midline shift is evident. Small chronic left parieto-occipital cortical infarcts are again noted, and chronic small vessel ischemia is again noted involving the cerebral white matter bilaterally. Prominent extra-axial CSF over both cerebral convexities is unchanged and attributed to atrophy. Prominent extra-axial CSF in the posterior fossa overlying the left greater than right cerebellar hemispheres is also unchanged. Vascular: Calcified atherosclerosis at the skull base. No hyperdense vessel. Skull: No acute fracture or suspicious osseous lesion. Sinuses/Orbits: Paranasal sinuses and mastoid air cells are clear. Unremarkable orbits. Other: None. IMPRESSION: 1. No evidence of acute intracranial abnormality. 2. Interval evolution of a subacute posterior right MCA territory infarct. 3.  Chronic small vessel ischemia. Electronically Signed   By: Sebastian Ache M.D.   On: 02/19/2023 18:02    Cardiac Studies   Echo:  1. Left ventricular ejection fraction, by estimation, is 65 to 70%. The  left ventricle has normal function. The left ventricle has no regional  wall motion abnormalities. Left ventricular diastolic parameters are  consistent with Grade I diastolic  dysfunction (impaired relaxation).   2. Right ventricular systolic function is normal. The right ventricular  size is mildly enlarged. There is normal pulmonary artery systolic  pressure. The estimated right ventricular systolic pressure is 19.6 mmHg.   3. The mitral valve is grossly normal. Trivial mitral valve  regurgitation. No evidence of mitral stenosis.   4. The aortic valve is tricuspid. There is mild calcification of the  aortic valve. There is mild thickening of the aortic valve. Aortic valve  regurgitation is not visualized. Aortic valve sclerosis/calcification is  present, without any evidence of  aortic stenosis. Aortic valve area, by VTI measures 1.74 cm.   5. The inferior vena cava is normal in size with greater than 50%  respiratory variability, suggesting right atrial pressure of 3 mmHg.    Patient Profile     77 y.o. female  with a hx of HTN, CKD, Crohn's, PAD s/p CEA in 2004, h/o prior bowel obstruction s/p hemicolectomy who is being seen 02/20/2023 for the evaluation of EKG changes and abnormal HS troponin at the request of Dr. Bebe Shaggy.   Assessment & Plan    SBO with possible GI bleed:  Conservative management per surgery and primary team.   Transfusions per primary team.  Hbg still less than 7.    Abnormal EKG:  Plan was medical management given high risk comorbid conditions and no acute cardiac symptoms.  EF OK as above.  Not a candidate for any med titration.   We will see as needed.     For questions or updates, please contact [facility] Please consult [email or web address] for contact  info under Cardiology/STEMI.   Signed, Rollene Rotunda, MD  02/21/2023, 10:26 AM

## 2023-02-21 NOTE — Progress Notes (Signed)
PROGRESS NOTE    [name]  RUE:[id number] DOB: Nov 04, 1945 DOA: 02/19/2023 PCP: Assunta Found, MD    Brief Narrative:  77 year old with history of previous GI bleed, hypertension, Crohn's disease, recent stroke with left hemiparesis, recent admission to the hospital 8/24-8/30 for acute stroke and discharged to a skilled nursing home.  She presents back to the emergency room from SNF with abdominal pain, distention and nausea vomiting over the last 3 days.  Also reported headache and back pain. At the emergency room normotensive.  Normothermic.  Heart rate 111-127.  Oxygen 93% on room air.  After given a dose of fentanyl, oxygen saturations dropped to 87%.  Leukocytosis with white blood cell count of 29,000.  Potassium 3.4.  Patient was started on IV fluid boluses, cultures were collected and is started on IV Zosyn.  She had 2200 mL urine, Foley was inserted.  CT scan abdomen pelvis showed small bowel obstruction with transition point in the lower posterior abdomen. Initial EKG suggested ST elevation, ruled out.  Patient was ultimately transferred to [facility] for higher level of care. Conservatively treated.  Remains in poor clinical status. 9/6, drop in hemoglobin and black tarry stool.  Blood transfusion started.   Assessment & Plan:   Partial small bowel obstruction due to adhesions: With clinical evidence of improvement.  Started on clears today.  Aggressive electrolyte corrections.  Surgery following.  NG tube out.  Sepsis secondary to UTI: UA consistent with UTI.  Blood cultures negative.  Urine culture growing E. coli. Zosyn started.  Continue until final improvement.  Abnormal EKG with ST elevation: Seen by cardiology.  Mildly elevated troponins, echo findings unremarkable.  Normal ejection fraction.  Patient with acute small bowel obstruction, recent stroke.  Symptomatic treatment.  Not a candidate for anticoagulation or left heart cath.  Will continue to follow.   Cardiology following.  Even not able to tolerate antiplatelet therapy due to below.  Acute GI bleeding: Suspect lower GI bleeding.  NG tube suction without evidence of bleeding. Hemoglobin dropped from recent baseline of 11-13-6.8.  Reported black tarry stool overnight. Patient is not a candidate for anesthesia endoscopy now. Started on PPI twice daily. Blood pressures are adequate.  1 unit transfusion ordered.  Since patient is not able to have more procedures, will write 1 more unit transfusion.  Hypokalemia: Severe and persistent.  Replace aggressively and monitor levels.  Magnesium is adequate.  Chronic medical issues including AKI, continue IV fluids. History of stroke, hold aspirin and Plavix. Hypertension, blood pressure stable.  Holding antihypertensives. Chron's disease, holding sulfasalazine.  Acute urinary retention: Foley catheter.  Continue until clinical improvement.  Goal of care: Remains in very poor clinical status. History of GI bleeding, recently stroke, now lethargic.  Ongoing GI bleeding.  Abnormal EKG unable to be anticoagulated or put on antiplatelet therapy.  Short-term prognosis is poor. Detailed discussion with patient's daughter and niece as well as her sister at the bedside.  Discussed and updated about difficult to treat conditions.  Also discussed about CODE STATUS. Changed her to DNR/DNI. Also seen by palliative care, changed to DNR with limited intervention.  If no improvement or deterioration, will treat with palliation and hospice.   DVT prophylaxis: SCDs Start: 02/20/23 1478   Code Status: Full code Family Communication: Daughter at bedside.  Niece at the bedside. Disposition Plan: Status is: Inpatient Remains inpatient appropriate because: Multiple acute medical issues.     Consultants:  General Surgery Cardiology Palliative  Procedures:  None  Antimicrobials:  Zosyn 9/5---   Subjective:  Patient seen and examined multiple times  today.  Family arrived at the bedside.  Patient remains lethargic.  She is turning her head on right side and not much following commands.  She does verbalize some understanding including her name and her daughter's name.  Asking for water in the morning.  Objective: Vitals:   02/21/23 0935 02/21/23 0944 02/21/23 0945 02/21/23 1133  BP: (!) 141/77 131/65 131/65 139/71  Pulse: 90 92 90 81  Resp: (!) 93 16 (!) 21 16  Temp:   (!) 97.5 F (36.4 C) (!) 97.5 F (36.4 C)  TempSrc:    Axillary  SpO2: 100% 100% 100% 100%  Weight:      Height:        Intake/Output Summary (Last 24 hours) at 02/21/2023 1439 Last data filed at 02/21/2023 1133 Gross per 24 hour  Intake 2521.28 ml  Output 525 ml  Net 1996.28 ml   Filed Weights   02/19/23 1403 02/20/23 0318  Weight: 54.6 kg 52.1 kg    Examination:  General exam: Sick looking.  Lethargic.  On room air. Respiratory system: No added sounds. Cardiovascular system: S1 & S2 heard, RRR.  Gastrointestinal system: Soft.  Nontender.  Bowel sound present. Central nervous system: Alert , anxious.  Grossly weak on left side.  Foley catheter with clear urine.    Data Reviewed: I have personally reviewed following labs and imaging studies  CBC: Recent Labs  Lab 02/19/23 1415 02/19/23 1424 02/20/23 0150 02/20/23 0642 02/21/23 0414  WBC 29.2*  --   --  20.2* 11.2*  NEUTROABS 26.8*  --   --   --  9.4*  HGB 9.5* 10.2* 7.5* 7.6* 6.8*  HCT 28.1* 30.0* 22.0* 23.9* 22.6*  MCV 102.9*  --   --  108.1* 109.7*  PLT 343  --   --  220 214   Basic Metabolic Panel: Recent Labs  Lab 02/19/23 1415 02/19/23 1424 02/20/23 0150 02/20/23 0642 02/21/23 0414  NA 136 135 141 141 143  K 3.4* 3.6 2.7* 2.6* 2.9*  CL 93* 96* 103 106 112*  CO2 22  --   --  24 24  GLUCOSE 136* 133* 109* 126* 140*  BUN 51* 47* 39* 36* 20  CREATININE 1.38* 1.50* 1.10* 1.02* 0.77  CALCIUM 9.6  --   --  8.5* 8.0*  MG  --   --   --   --  1.7  PHOS  --   --   --   --  1.5*    GFR: Estimated Creatinine Clearance: 47.7 mL/min (by C-G formula based on SCr of 0.77 mg/dL). Liver Function Tests: Recent Labs  Lab 02/19/23 1415  AST 29  ALT 23  ALKPHOS 49  BILITOT 1.0  PROT 6.2*  ALBUMIN 3.4*   Recent Labs  Lab 02/19/23 1415  LIPASE 64*   No results for input(s): "AMMONIA" in the last 168 hours. Coagulation Profile: No results for input(s): "INR", "PROTIME" in the last 168 hours. Cardiac Enzymes: No results for input(s): "CKTOTAL", "CKMB", "CKMBINDEX", "TROPONINI" in the last 168 hours. BNP (last 3 results) No results for input(s): "PROBNP" in the last 8760 hours. HbA1C: No results for input(s): "HGBA1C" in the last 72 hours. CBG: Recent Labs  Lab 02/19/23 2244 02/20/23 0135 02/20/23 1158 02/20/23 1612 02/21/23 0758  GLUCAP 109* 106* 106* 108* 138*   Lipid Profile: No results for input(s): "CHOL", "HDL", "LDLCALC", "TRIG", "CHOLHDL", "LDLDIRECT" in the last 72  hours. Thyroid Function Tests: No results for input(s): "TSH", "T4TOTAL", "FREET4", "T3FREE", "THYROIDAB" in the last 72 hours. Anemia Panel: No results for input(s): "VITAMINB12", "FOLATE", "FERRITIN", "TIBC", "IRON", "RETICCTPCT" in the last 72 hours. Sepsis Labs: Recent Labs  Lab 02/19/23 2139 02/20/23 0149  LATICACIDVEN 1.3 1.1    Recent Results (from the past 240 hour(s))  SARS Coronavirus 2 by RT PCR (hospital order, performed in [facility] hospital lab) *cepheid single result test* Anterior Nasal Swab     Status: None   Collection Time: 02/19/23  2:13 PM   Specimen: Anterior Nasal Swab  Result Value Ref Range Status   SARS Coronavirus 2 by RT PCR NEGATIVE NEGATIVE Final    Comment: (NOTE) SARS-CoV-2 target nucleic acids are NOT DETECTED.  The SARS-CoV-2 RNA is generally detectable in upper and lower respiratory specimens during the acute phase of infection. The lowest concentration of SARS-CoV-2 viral copies this assay can detect is 250 copies / mL. A negative  result does not preclude SARS-CoV-2 infection and should not be used as the sole basis for treatment or other patient management decisions.  A negative result may occur with improper specimen collection / handling, submission of specimen other than nasopharyngeal swab, presence of viral mutation(s) within the areas targeted by this assay, and inadequate number of viral copies (<250 copies / mL). A negative result must be combined with clinical observations, patient history, and epidemiological information.  Fact Sheet for Patients:   RoadLapTop.co.za  Fact Sheet for Healthcare Providers: http://kim-miller.com/  This test is not yet approved or  cleared by the [place] FDA and has been authorized for detection and/or diagnosis of SARS-CoV-2 by FDA under an Emergency Use Authorization (EUA).  This EUA will remain in effect (meaning this test can be used) for the duration of the COVID-19 declaration under Section 564(b)(1) of the Act, 21 U.S.C. section 360bbb-3(b)(1), unless the authorization is terminated or revoked sooner.  Performed at [facility], [address], [place], [place] [address]   Culture, blood (Routine X 2) w Reflex to ID Panel     Status: None (Preliminary result)   Collection Time: 02/19/23  9:30 PM   Specimen: BLOOD  Result Value Ref Range Status   Specimen Description BLOOD BLOOD RIGHT HAND  Final   Special Requests   Final    BOTTLES DRAWN AEROBIC ONLY Blood Culture results may not be optimal due to an inadequate volume of blood received in culture bottles   Culture   Final    NO GROWTH 2 DAYS Performed at [facility], [address], [place], [place] [address]    Report Status PENDING  Incomplete  Urine Culture (for pregnant, neutropenic or urologic patients or patients with an indwelling urinary catheter)     Status: Abnormal (Preliminary result)   Collection Time: 02/19/23  9:30 PM   Specimen: Urine,  Catheterized  Result Value Ref Range Status   Specimen Description   Final    URINE, CATHETERIZED Performed at [facility], [address], [place], [place] [address]    Special Requests   Final    NONE Performed at [facility], [address], [place], [place] [address]    Culture >=100,000 COLONIES/mL ESCHERICHIA COLI (A)  Final   Report Status PENDING  Incomplete  Culture, blood (Routine X 2) w Reflex to ID Panel     Status: None (Preliminary result)   Collection Time: 02/19/23 11:19 PM   Specimen: BLOOD  Result Value Ref Range Status  Specimen Description BLOOD LEFT ANTECUBITAL  Final   Special Requests   Final    BOTTLES DRAWN AEROBIC AND ANAEROBIC Blood Culture results may not be optimal due to an excessive volume of blood received in culture bottles   Culture   Final    NO GROWTH 2 DAYS Performed at [facility] [address], [place], [place] [address]    Report Status PENDING  Incomplete         Radiology Studies: DG Abd Portable 1V-Small Bowel Obstruction Protocol-initial, 8 hr delay  Result Date: 02/20/2023 CLINICAL DATA:  SBO 8 hour delay EXAM: PORTABLE ABDOMEN - 1 VIEW COMPARISON:  02/20/2023, CT 02/19/2023 FINDINGS: Esophageal tube tip at the distal esophagus. Mild central small bowel distension. Enteral contrast appears within the colon. Partially visualized left hip replacement IMPRESSION: 1. Esophageal tube tip at the distal esophageal region 2. Enteral contrast visible in the colon. Mild residual small bowel distension but probably decreased compared to prior radiograph Electronically Signed   By: Jasmine Pang M.D.   On: 02/20/2023 21:24   ECHOCARDIOGRAM COMPLETE  Result Date: 02/20/2023    ECHOCARDIOGRAM REPORT   Patient Name:   [name] Date of Exam: 02/20/2023 Medical Rec #:  [id number]    Height:       65.0 in Accession #:    [id number]   Weight:       120.4 lb Date of Birth:  May 21, 1946    BSA:          1.594 m Patient Age:    23 years     BP:            131/65 mmHg Patient Gender: F            HR:           97 bpm. Exam Location:  Inpatient Procedure: 2D Echo, Color Doppler and Cardiac Doppler Indications:    Abnormal ECG  History:        Patient has prior history of Echocardiogram examinations, most                 recent 02/08/2023. Stroke; Risk Factors:Hypertension and Current                 Smoker.  Sonographer:    Milda Smart Referring Phys: [phone] DONALD WICKLINE  Sonographer Comments: Image acquisition challenging due to patient body habitus and Image acquisition challenging due to respiratory motion. IMPRESSIONS  1. Left ventricular ejection fraction, by estimation, is 65 to 70%. The left ventricle has normal function. The left ventricle has no regional wall motion abnormalities. Left ventricular diastolic parameters are consistent with Grade I diastolic dysfunction (impaired relaxation).  2. Right ventricular systolic function is normal. The right ventricular size is mildly enlarged. There is normal pulmonary artery systolic pressure. The estimated right ventricular systolic pressure is 19.6 mmHg.  3. The mitral valve is grossly normal. Trivial mitral valve regurgitation. No evidence of mitral stenosis.  4. The aortic valve is tricuspid. There is mild calcification of the aortic valve. There is mild thickening of the aortic valve. Aortic valve regurgitation is not visualized. Aortic valve sclerosis/calcification is present, without any evidence of aortic stenosis. Aortic valve area, by VTI measures 1.74 cm.  5. The inferior vena cava is normal in size with greater than 50% respiratory variability, suggesting right atrial pressure of 3 mmHg. Comparison(s): No significant change from prior study. FINDINGS  Left Ventricle: Left ventricular ejection fraction, by estimation, is 65 to 70%.  The left ventricle has normal function. The left ventricle has no regional wall motion abnormalities. The left ventricular internal cavity size was normal in size. There is   no left ventricular hypertrophy. Left ventricular diastolic parameters are consistent with Grade I diastolic dysfunction (impaired relaxation). Right Ventricle: The right ventricular size is mildly enlarged. No increase in right ventricular wall thickness. Right ventricular systolic function is normal. There is normal pulmonary artery systolic pressure. The tricuspid regurgitant velocity is 2.04  m/s, and with an assumed right atrial pressure of 3 mmHg, the estimated right ventricular systolic pressure is 19.6 mmHg. Left Atrium: Left atrial size was normal in size. Right Atrium: Right atrial size was normal in size. Pericardium: There is no evidence of pericardial effusion. Presence of epicardial fat layer. Mitral Valve: The mitral valve is grossly normal. Trivial mitral valve regurgitation. No evidence of mitral valve stenosis. MV peak gradient, 7.2 mmHg. The mean mitral valve gradient is 3.0 mmHg. Tricuspid Valve: The tricuspid valve is grossly normal. Tricuspid valve regurgitation is trivial. No evidence of tricuspid stenosis. The flow in the hepatic veins is normal during ventricular systole. Aortic Valve: The aortic valve is tricuspid. There is mild calcification of the aortic valve. There is mild thickening of the aortic valve. Aortic valve regurgitation is not visualized. Aortic valve sclerosis/calcification is present, without any evidence of aortic stenosis. Aortic valve mean gradient measures 9.0 mmHg. Aortic valve peak gradient measures 16.2 mmHg. Aortic valve area, by VTI measures 1.74 cm. Pulmonic Valve: The pulmonic valve was grossly normal. Pulmonic valve regurgitation is not visualized. No evidence of pulmonic stenosis. Aorta: The aortic root, ascending aorta and aortic arch are all structurally normal, with no evidence of dilitation or obstruction and the aortic root is normal in size and structure. Venous: The inferior vena cava is normal in size with greater than 50% respiratory variability,  suggesting right atrial pressure of 3 mmHg. IAS/Shunts: The atrial septum is grossly normal.  LEFT VENTRICLE PLAX 2D LVIDd:         3.40 cm   Diastology LVIDs:         2.20 cm   LV e' medial:    4.57 cm/s LV PW:         0.80 cm   LV E/e' medial:  16.9 LV IVS:        0.80 cm   LV e' lateral:   4.62 cm/s LVOT diam:     1.70 cm   LV E/e' lateral: 16.7 LV SV:         58 LV SV Index:   36 LVOT Area:     2.27 cm  RIGHT VENTRICLE RV Basal diam:  3.40 cm RV S prime:     12.40 cm/s TAPSE (M-mode): 2.0 cm LEFT ATRIUM             Index       RIGHT ATRIUM          Index LA diam:        2.20 cm 1.38 cm/m  RA Area:     7.90 cm LA Vol (A2C):   14.7 ml 9.22 ml/m  RA Volume:   14.90 ml 9.35 ml/m LA Vol (A4C):   14.4 ml 9.00 ml/m LA Biplane Vol: 15.5 ml 9.72 ml/m  AORTIC VALVE AV Area (Vmax):    1.71 cm AV Area (Vmean):   1.67 cm AV Area (VTI):     1.74 cm AV Vmax:  201.00 cm/s AV Vmean:          148.000 cm/s AV VTI:            0.334 m AV Peak Grad:      16.2 mmHg AV Mean Grad:      9.0 mmHg LVOT Vmax:         151.00 cm/s LVOT Vmean:        109.000 cm/s LVOT VTI:          0.256 m LVOT/AV VTI ratio: 0.77  AORTA Ao Root diam: 2.80 cm MITRAL VALVE                TRICUSPID VALVE MV Area (PHT): 2.85 cm     TR Peak grad:   16.6 mmHg MV Area VTI:   2.55 cm     TR Vmax:        204.00 cm/s MV Peak grad:  7.2 mmHg MV Mean grad:  3.0 mmHg     SHUNTS MV Vmax:       1.34 m/s     Systemic VTI:  0.26 m MV Vmean:      87.4 cm/s    Systemic Diam: 1.70 cm MV Decel Time: 266 msec MV E velocity: 77.40 cm/s MV A velocity: 105.00 cm/s MV E/A ratio:  0.74 Lennie Odor MD Electronically signed by Lennie Odor MD Signature Date/Time: 02/20/2023/7:36:29 AM    Final    DG Abdomen 1 View  Result Date: 02/20/2023 CLINICAL DATA:  ng tube placement EXAM: ABDOMEN - 1 VIEW COMPARISON:  CT abdomen pelvis 02/19/2023 FINDINGS: Enteric tube courses below the hemidiaphragm with tip overlying the expected region of the gastroesophageal  junction/proximal gastric lumen and side port overlying the expected region of the distal esophagus. Dilatation of several loops of small bowel. No radio-opaque calculi or other significant radiographic abnormality are seen. IMPRESSION: 1. Enteric tube courses below the hemidiaphragm with tip overlying the expected region of the gastroesophageal junction/proximal gastric lumen and side port overlying the expected region of the distal esophagus. Recommend advancing by 8 cm. 2. Small-bowel obstruction. Electronically Signed   By: Tish Frederickson M.D.   On: 02/20/2023 03:03   DG Chest Portable 1 View  Result Date: 02/20/2023 CLINICAL DATA:  Pain, vomiting EXAM: PORTABLE CHEST 1 VIEW COMPARISON:  02/19/2023 FINDINGS: The heart size and mediastinal contours are within normal limits. Both lungs are clear. The visualized skeletal structures are unremarkable. IMPRESSION: Normal study. Electronically Signed   By: Charlett Nose M.D.   On: 02/20/2023 02:05   DG Chest Portable 1 View  Result Date: 02/19/2023 CLINICAL DATA:  NG tube advancement. EXAM: PORTABLE CHEST 1 VIEW COMPARISON:  Radiographs 02/19/2023 at 9:39 p.m. FINDINGS: Similar position of the NG tube tip near the gastroesophageal junction and side-port in the distal esophagus. Recommend advancement approximately 10 cm to ensure side port is within the stomach. Remainder unchanged. IMPRESSION: Similar position of the NG tube in the distal esophagus. Recommend advancement approximately 10 cm to ensure side port is within the stomach. Electronically Signed   By: Minerva Fester M.D.   On: 02/19/2023 23:44   DG Chest Port 1 View  Result Date: 02/19/2023 CLINICAL DATA:  NG tube placement EXAM: PORTABLE CHEST 1 VIEW COMPARISON:  02/12/2023 FINDINGS: NG tube tip is in the distal esophagus. Heart mediastinal contours within normal limits. Visualized lungs clear. No effusions. Lung apices not visualized. IMPRESSION: NG tube tip in the distal esophagus. This could be  advanced several cm into the  stomach. Electronically Signed   By: Charlett Nose M.D.   On: 02/19/2023 22:14   CT ABDOMEN PELVIS W CONTRAST  Result Date: 02/19/2023 CLINICAL DATA:  Nausea and vomiting for 3 days.  Abdominal pain. EXAM: CT ABDOMEN AND PELVIS WITH CONTRAST TECHNIQUE: Multidetector CT imaging of the abdomen and pelvis was performed using the standard protocol following bolus administration of intravenous contrast. RADIATION DOSE REDUCTION: This exam was performed according to the departmental dose-optimization program which includes automated exposure control, adjustment of the mA and/or kV according to patient size and/or use of iterative reconstruction technique. CONTRAST:  75mL OMNIPAQUE IOHEXOL 300 MG/ML  SOLN COMPARISON:  07/07/2015 FINDINGS: Lower chest: No acute abnormality. Hepatobiliary: Respiratory motion mildly degrades detail in the upper abdomen. Unremarkable liver. Cholecystectomy. Prominent bile duct likely due to reservoir effect. Pancreas: Coarse calcifications in the pancreatic head due to sequela of chronic pancreatitis. No acute pancreatitis. No ductal dilation. Spleen: Unremarkable. Adrenals/Urinary Tract: Stable adrenal glands. No urinary calculi or hydronephrosis. Nondistended bladder about a Foley catheter. Stomach/Bowel: Stomach is within normal limits. Postoperative changes with neoterminal ileum. Marked dilation of the small bowel with air-fluid levels and transition point in the low posterior abdomen (series 2/image 50). Decompressed colon. Colonic diverticulosis without diverticulitis. Duodenal diverticulum. Vascular/Lymphatic: Aortic atherosclerosis. No enlarged abdominal or pelvic lymph nodes. Reproductive: Uterus and bilateral adnexa are unremarkable. Other: No free intraperitoneal fluid or air. 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 Musculoskeletal: Left THA.  No acute fracture. IMPRESSION: 1. Small-bowel obstruction with transition point in the low  posterior abdomen. Aortic Atherosclerosis (ICD10-I70.0). Electronically Signed   By: Minerva Fester M.D.   On: 02/19/2023 18:04   CT Head Wo Contrast  Result Date: 02/19/2023 CLINICAL DATA:  Headache, sudden, severe.  Nausea and vomiting. EXAM: CT HEAD WITHOUT CONTRAST TECHNIQUE: Contiguous axial images were obtained from the base of the skull through the vertex without intravenous contrast. RADIATION DOSE REDUCTION: This exam was performed according to the departmental dose-optimization program which includes automated exposure control, adjustment of the mA and/or kV according to patient size and/or use of iterative reconstruction technique. COMPARISON:  CTA head and neck 02/09/2023.  MRI head 02/08/2023. FINDINGS: Brain: Hypodensity associated with the posterior right MCA territory infarct has decreased compared to the prior CT with cortex currently only being subtly hypodense likely reflecting fogging phenomenon. No new infarct, acute hemorrhage, mass, or midline shift is evident. Small chronic left parieto-occipital cortical infarcts are again noted, and chronic small vessel ischemia is again noted involving the cerebral white matter bilaterally. Prominent extra-axial CSF over both cerebral convexities is unchanged and attributed to atrophy. Prominent extra-axial CSF in the posterior fossa overlying the left greater than right cerebellar hemispheres is also unchanged. Vascular: Calcified atherosclerosis at the skull base. No hyperdense vessel. Skull: No acute fracture or suspicious osseous lesion. Sinuses/Orbits: Paranasal sinuses and mastoid air cells are clear. Unremarkable orbits. Other: None. IMPRESSION: 1. No evidence of acute intracranial abnormality. 2. Interval evolution of a subacute posterior right MCA territory infarct. 3. Chronic small vessel ischemia. Electronically Signed   By: Sebastian Ache M.D.   On: 02/19/2023 18:02        Scheduled Meds:  Chlorhexidine Gluconate Cloth  6 each Topical  Daily   pantoprazole (PROTONIX) IV  40 mg Intravenous Q12H   Continuous Infusions:  sodium chloride Stopped (02/20/23 1319)   dextrose 5 % and 0.9 % NaCl with KCl 20 mEq/L 75 mL/hr at 02/21/23 0813   piperacillin-tazobactam (ZOSYN)  IV 3.375 g (02/21/23  1301)     LOS: 1 day    Time spent: 35 minutes    Dorcas Carrow, MD [facility]

## 2023-02-21 NOTE — Progress Notes (Signed)
Subjective: Patient just says "water, water" this morning.  Just had a BM this morning and RN reports it was black, tarry in appearance.  Getting blood for hgb of 6.8.  ROS: See above, otherwise other systems negative  Objective: Vital signs in last 24 hours: Temp:  [98.2 F (36.8 C)-99.2 F (37.3 C)] 98.7 F (37.1 C) (09/07 0800) Pulse Rate:  [86-108] 86 (09/07 0642) Resp:  [0-24] 14 (09/07 0642) BP: (116-143)/(62-71) 139/70 (09/07 0800) SpO2:  [99 %-100 %] 100 % (09/07 0642) Last BM Date :  (pt doesn't remember)  Intake/Output from previous day: 09/06 0701 - 09/07 0700 In: 2145.5 [I.V.:1894.3; IV Piggyback:251.2] Out: 875 [Urine:825; Emesis/NG output:50] Intake/Output this shift: No intake/output data recorded.  PE: Gen: laying in bed and just repeats water despite different questions Abd: soft, NT, mild distention, NGT with no output since yesterday.  Lab Results:  Recent Labs    02/20/23 0642 02/21/23 0414  WBC 20.2* 11.2*  HGB 7.6* 6.8*  HCT 23.9* 22.6*  PLT 220 214   BMET Recent Labs    02/20/23 0642 02/21/23 0414  NA 141 143  K 2.6* 2.9*  CL 106 112*  CO2 24 24  GLUCOSE 126* 140*  BUN 36* 20  CREATININE 1.02* 0.77  CALCIUM 8.5* 8.0*   PT/INR No results for input(s): "LABPROT", "INR" in the last 72 hours. CMP     Component Value Date/Time   NA 143 02/21/2023 0414   K 2.9 (L) 02/21/2023 0414   CL 112 (H) 02/21/2023 0414   CO2 24 02/21/2023 0414   GLUCOSE 140 (H) 02/21/2023 0414   BUN 20 02/21/2023 0414   CREATININE 0.77 02/21/2023 0414   CREATININE 1.08 (H) 12/03/2021 1040   CALCIUM 8.0 (L) 02/21/2023 0414   PROT 6.2 (L) 02/19/2023 1415   ALBUMIN 3.4 (L) 02/19/2023 1415   AST 29 02/19/2023 1415   ALT 23 02/19/2023 1415   ALKPHOS 49 02/19/2023 1415   BILITOT 1.0 02/19/2023 1415   GFRNONAA >60 02/21/2023 0414   GFRAA >60 07/07/2015 1255   Lipase     Component Value Date/Time   LIPASE 64 (H) 02/19/2023 1415        Studies/Results: DG Abd Portable 1V-Small Bowel Obstruction Protocol-initial, 8 hr delay  Result Date: 02/20/2023 CLINICAL DATA:  SBO 8 hour delay EXAM: PORTABLE ABDOMEN - 1 VIEW COMPARISON:  02/20/2023, CT 02/19/2023 FINDINGS: Esophageal tube tip at the distal esophagus. Mild central small bowel distension. Enteral contrast appears within the colon. Partially visualized left hip replacement IMPRESSION: 1. Esophageal tube tip at the distal esophageal region 2. Enteral contrast visible in the colon. Mild residual small bowel distension but probably decreased compared to prior radiograph Electronically Signed   By: Jasmine Pang M.D.   On: 02/20/2023 21:24   ECHOCARDIOGRAM COMPLETE  Result Date: 02/20/2023    ECHOCARDIOGRAM REPORT   Patient Name:   [name] Date of Exam: 02/20/2023 Medical Rec #:  [id number]    Height:       65.0 in Accession #:    [id number]   Weight:       120.4 lb Date of Birth:  [birth year]    BSA:          1.594 m Patient Age:    77 years     BP:           131/65 mmHg Patient Gender: F            HR:  97 bpm. Exam Location:  Inpatient Procedure: 2D Echo, Color Doppler and Cardiac Doppler Indications:    Abnormal ECG  History:        Patient has prior history of Echocardiogram examinations, most                 recent 02/08/2023. Stroke; Risk Factors:Hypertension and Current                 Smoker.  Sonographer:    Milda Smart Referring Phys: [phone] DONALD WICKLINE  Sonographer Comments: Image acquisition challenging due to patient body habitus and Image acquisition challenging due to respiratory motion. IMPRESSIONS  1. Left ventricular ejection fraction, by estimation, is 65 to 70%. The left ventricle has normal function. The left ventricle has no regional wall motion abnormalities. Left ventricular diastolic parameters are consistent with Grade I diastolic dysfunction (impaired relaxation).  2. Right ventricular systolic function is normal. The right ventricular size  is mildly enlarged. There is normal pulmonary artery systolic pressure. The estimated right ventricular systolic pressure is 19.6 mmHg.  3. The mitral valve is grossly normal. Trivial mitral valve regurgitation. No evidence of mitral stenosis.  4. The aortic valve is tricuspid. There is mild calcification of the aortic valve. There is mild thickening of the aortic valve. Aortic valve regurgitation is not visualized. Aortic valve sclerosis/calcification is present, without any evidence of aortic stenosis. Aortic valve area, by VTI measures 1.74 cm.  5. The inferior vena cava is normal in size with greater than 50% respiratory variability, suggesting right atrial pressure of 3 mmHg. Comparison(s): No significant change from prior study. FINDINGS  Left Ventricle: Left ventricular ejection fraction, by estimation, is 65 to 70%. The left ventricle has normal function. The left ventricle has no regional wall motion abnormalities. The left ventricular internal cavity size was normal in size. There is  no left ventricular hypertrophy. Left ventricular diastolic parameters are consistent with Grade I diastolic dysfunction (impaired relaxation). Right Ventricle: The right ventricular size is mildly enlarged. No increase in right ventricular wall thickness. Right ventricular systolic function is normal. There is normal pulmonary artery systolic pressure. The tricuspid regurgitant velocity is 2.04  m/s, and with an assumed right atrial pressure of 3 mmHg, the estimated right ventricular systolic pressure is 19.6 mmHg. Left Atrium: Left atrial size was normal in size. Right Atrium: Right atrial size was normal in size. Pericardium: There is no evidence of pericardial effusion. Presence of epicardial fat layer. Mitral Valve: The mitral valve is grossly normal. Trivial mitral valve regurgitation. No evidence of mitral valve stenosis. MV peak gradient, 7.2 mmHg. The mean mitral valve gradient is 3.0 mmHg. Tricuspid Valve: The  tricuspid valve is grossly normal. Tricuspid valve regurgitation is trivial. No evidence of tricuspid stenosis. The flow in the hepatic veins is normal during ventricular systole. Aortic Valve: The aortic valve is tricuspid. There is mild calcification of the aortic valve. There is mild thickening of the aortic valve. Aortic valve regurgitation is not visualized. Aortic valve sclerosis/calcification is present, without any evidence of aortic stenosis. Aortic valve mean gradient measures 9.0 mmHg. Aortic valve peak gradient measures 16.2 mmHg. Aortic valve area, by VTI measures 1.74 cm. Pulmonic Valve: The pulmonic valve was grossly normal. Pulmonic valve regurgitation is not visualized. No evidence of pulmonic stenosis. Aorta: The aortic root, ascending aorta and aortic arch are all structurally normal, with no evidence of dilitation or obstruction and the aortic root is normal in size and structure. Venous: The inferior vena cava is  normal in size with greater than 50% respiratory variability, suggesting right atrial pressure of 3 mmHg. IAS/Shunts: The atrial septum is grossly normal.  LEFT VENTRICLE PLAX 2D LVIDd:         3.40 cm   Diastology LVIDs:         2.20 cm   LV e' medial:    4.57 cm/s LV PW:         0.80 cm   LV E/e' medial:  16.9 LV IVS:        0.80 cm   LV e' lateral:   4.62 cm/s LVOT diam:     1.70 cm   LV E/e' lateral: 16.7 LV SV:         58 LV SV Index:   36 LVOT Area:     2.27 cm  RIGHT VENTRICLE RV Basal diam:  3.40 cm RV S prime:     12.40 cm/s TAPSE (M-mode): 2.0 cm LEFT ATRIUM             Index       RIGHT ATRIUM          Index LA diam:        2.20 cm 1.38 cm/m  RA Area:     7.90 cm LA Vol (A2C):   14.7 ml 9.22 ml/m  RA Volume:   14.90 ml 9.35 ml/m LA Vol (A4C):   14.4 ml 9.00 ml/m LA Biplane Vol: 15.5 ml 9.72 ml/m  AORTIC VALVE AV Area (Vmax):    1.71 cm AV Area (Vmean):   1.67 cm AV Area (VTI):     1.74 cm AV Vmax:           201.00 cm/s AV Vmean:          148.000 cm/s AV VTI:             0.334 m AV Peak Grad:      16.2 mmHg AV Mean Grad:      9.0 mmHg LVOT Vmax:         151.00 cm/s LVOT Vmean:        109.000 cm/s LVOT VTI:          0.256 m LVOT/AV VTI ratio: 0.77  AORTA Ao Root diam: 2.80 cm MITRAL VALVE                TRICUSPID VALVE MV Area (PHT): 2.85 cm     TR Peak grad:   16.6 mmHg MV Area VTI:   2.55 cm     TR Vmax:        204.00 cm/s MV Peak grad:  7.2 mmHg MV Mean grad:  3.0 mmHg     SHUNTS MV Vmax:       1.34 m/s     Systemic VTI:  0.26 m MV Vmean:      87.4 cm/s    Systemic Diam: 1.70 cm MV Decel Time: 266 msec MV E velocity: 77.40 cm/s MV A velocity: 105.00 cm/s MV E/A ratio:  0.74 Lennie Odor MD Electronically signed by Lennie Odor MD Signature Date/Time: 02/20/2023/7:36:29 AM    Final    DG Abdomen 1 View  Result Date: 02/20/2023 CLINICAL DATA:  ng tube placement EXAM: ABDOMEN - 1 VIEW COMPARISON:  CT abdomen pelvis 02/19/2023 FINDINGS: Enteric tube courses below the hemidiaphragm with tip overlying the expected region of the gastroesophageal junction/proximal gastric lumen and side port overlying the expected region of the distal esophagus. Dilatation of several loops of small bowel.  No radio-opaque calculi or other significant radiographic abnormality are seen. IMPRESSION: 1. Enteric tube courses below the hemidiaphragm with tip overlying the expected region of the gastroesophageal junction/proximal gastric lumen and side port overlying the expected region of the distal esophagus. Recommend advancing by 8 cm. 2. Small-bowel obstruction. Electronically Signed   By: Tish Frederickson M.D.   On: 02/20/2023 03:03   DG Chest Portable 1 View  Result Date: 02/20/2023 CLINICAL DATA:  Pain, vomiting EXAM: PORTABLE CHEST 1 VIEW COMPARISON:  02/19/2023 FINDINGS: The heart size and mediastinal contours are within normal limits. Both lungs are clear. The visualized skeletal structures are unremarkable. IMPRESSION: Normal study. Electronically Signed   By: Charlett Nose M.D.   On:  02/20/2023 02:05   DG Chest Portable 1 View  Result Date: 02/19/2023 CLINICAL DATA:  NG tube advancement. EXAM: PORTABLE CHEST 1 VIEW COMPARISON:  Radiographs 02/19/2023 at 9:39 p.m. FINDINGS: Similar position of the NG tube tip near the gastroesophageal junction and side-port in the distal esophagus. Recommend advancement approximately 10 cm to ensure side port is within the stomach. Remainder unchanged. IMPRESSION: Similar position of the NG tube in the distal esophagus. Recommend advancement approximately 10 cm to ensure side port is within the stomach. Electronically Signed   By: Minerva Fester M.D.   On: 02/19/2023 23:44   DG Chest Port 1 View  Result Date: 02/19/2023 CLINICAL DATA:  NG tube placement EXAM: PORTABLE CHEST 1 VIEW COMPARISON:  02/12/2023 FINDINGS: NG tube tip is in the distal esophagus. Heart mediastinal contours within normal limits. Visualized lungs clear. No effusions. Lung apices not visualized. IMPRESSION: NG tube tip in the distal esophagus. This could be advanced several cm into the stomach. Electronically Signed   By: Charlett Nose M.D.   On: 02/19/2023 22:14   CT ABDOMEN PELVIS W CONTRAST  Result Date: 02/19/2023 CLINICAL DATA:  Nausea and vomiting for 3 days.  Abdominal pain. EXAM: CT ABDOMEN AND PELVIS WITH CONTRAST TECHNIQUE: Multidetector CT imaging of the abdomen and pelvis was performed using the standard protocol following bolus administration of intravenous contrast. RADIATION DOSE REDUCTION: This exam was performed according to the departmental dose-optimization program which includes automated exposure control, adjustment of the mA and/or kV according to patient size and/or use of iterative reconstruction technique. CONTRAST:  75mL OMNIPAQUE IOHEXOL 300 MG/ML  SOLN COMPARISON:  07/07/2015 FINDINGS: Lower chest: No acute abnormality. Hepatobiliary: Respiratory motion mildly degrades detail in the upper abdomen. Unremarkable liver. Cholecystectomy. Prominent bile duct  likely due to reservoir effect. Pancreas: Coarse calcifications in the pancreatic head due to sequela of chronic pancreatitis. No acute pancreatitis. No ductal dilation. Spleen: Unremarkable. Adrenals/Urinary Tract: Stable adrenal glands. No urinary calculi or hydronephrosis. Nondistended bladder about a Foley catheter. Stomach/Bowel: Stomach is within normal limits. Postoperative changes with neoterminal ileum. Marked dilation of the small bowel with air-fluid levels and transition point in the low posterior abdomen (series 2/image 50). Decompressed colon. Colonic diverticulosis without diverticulitis. Duodenal diverticulum. Vascular/Lymphatic: Aortic atherosclerosis. No enlarged abdominal or pelvic lymph nodes. Reproductive: Uterus and bilateral adnexa are unremarkable. Other: No free intraperitoneal fluid or air. 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 Musculoskeletal: Left THA.  No acute fracture. IMPRESSION: 1. Small-bowel obstruction with transition point in the low posterior abdomen. Aortic Atherosclerosis (ICD10-I70.0). Electronically Signed   By: Minerva Fester M.D.   On: 02/19/2023 18:04   CT Head Wo Contrast  Result Date: 02/19/2023 CLINICAL DATA:  Headache, sudden, severe.  Nausea and vomiting. EXAM: CT HEAD WITHOUT CONTRAST TECHNIQUE:  Contiguous axial images were obtained from the base of the skull through the vertex without intravenous contrast. RADIATION DOSE REDUCTION: This exam was performed according to the departmental dose-optimization program which includes automated exposure control, adjustment of the mA and/or kV according to patient size and/or use of iterative reconstruction technique. COMPARISON:  CTA head and neck 02/09/2023.  MRI head 02/08/2023. FINDINGS: Brain: Hypodensity associated with the posterior right MCA territory infarct has decreased compared to the prior CT with cortex currently only being subtly hypodense likely reflecting fogging phenomenon. No new infarct,  acute hemorrhage, mass, or midline shift is evident. Small chronic left parieto-occipital cortical infarcts are again noted, and chronic small vessel ischemia is again noted involving the cerebral white matter bilaterally. Prominent extra-axial CSF over both cerebral convexities is unchanged and attributed to atrophy. Prominent extra-axial CSF in the posterior fossa overlying the left greater than right cerebellar hemispheres is also unchanged. Vascular: Calcified atherosclerosis at the skull base. No hyperdense vessel. Skull: No acute fracture or suspicious osseous lesion. Sinuses/Orbits: Paranasal sinuses and mastoid air cells are clear. Unremarkable orbits. Other: None. IMPRESSION: 1. No evidence of acute intracranial abnormality. 2. Interval evolution of a subacute posterior right MCA territory infarct. 3. Chronic small vessel ischemia. Electronically Signed   By: Sebastian Ache M.D.   On: 02/19/2023 18:02    Anti-infectives: Anti-infectives (From admission, onward)    Start     Dose/Rate Route Frequency Ordered Stop   02/19/23 2200  piperacillin-tazobactam (ZOSYN) IVPB 3.375 g        3.375 g 12.5 mL/hr over 240 Minutes Intravenous Every 8 hours 02/19/23 1450     02/19/23 1445  piperacillin-tazobactam (ZOSYN) IVPB 3.375 g        3.375 g 100 mL/hr over 30 Minutes Intravenous  Once 02/19/23 1433 02/19/23 1630        Assessment/Plan pSBO with possible UGI bleed -8-hr delay films reviewed and contrast in her colon.  She is moving her bowels -BM was black, tarry per RN report and hgb 6.8.  getting blood.  Apparently  had a recent UGI bleed.  NGT is clear with no bloody output from this so if she were oozing, it would be further down. -d/w primary service.  Doesn't feel candidate for endo and given overall stability, will continue conservative monitoring -allow CLD and monitor  FEN - CLD VTE - on hold due to anemia ID - zosyn, for UTI  UTI Recent CVA EKG changes HTN Crohn's  disease AKI Anemia   I reviewed Consultant cardiology notes, hospitalist notes, last 24 h vitals and pain scores, last 48 h intake and output, last 24 h labs and trends, and last 24 h imaging results.   LOS: 1 day    Letha Cape , [facility] Surgery 02/21/2023, 8:41 AM Please see Amion for pager number during day hours 7:00am-4:30pm or 7:00am -11:30am on weekends

## 2023-02-21 NOTE — Progress Notes (Addendum)
Patient with single episode of black tarry stool.  A.m. hemoglobin 6.8.  Serial H&H's ordered.  Protonix 40 mg IV twice daily ordered.  Transfusion ordered.  SCDs for DVT prophylaxis.  Patient with recent upper GI bleed.  ASA, Plavix on hold. Will defer to a.m. team timing of GI consult given patient's with SBO, NGT in place.

## 2023-02-21 NOTE — Progress Notes (Signed)
NG tube removed per Barnetta Chapel PA pt tolerated well

## 2023-02-21 NOTE — Consult Note (Signed)
Consultation Note Date: 02/21/2023   Patient Name: [name]  DOB: 12-May-1946  MRN: [id number]  Age / Sex: 77 y.o., female  PCP: Assunta Found, MD Referring Physician: Dorcas Carrow, MD  Reason for Consultation: Establishing goals of care  HPI/Patient Profile: 77 y.o. female  with past medical history of GI bleed, hypertension, Crohn's, recent stroke with left hemiparesis, small bowel obstruction admitted on 02/19/2023 with abdominal pain.   Recent admission to the hospital 8/24-8/30 for acute stroke and discharged to a skilled nursing home. She presents back to the emergency room from SNF with abdominal pain, distention and nausea vomiting over the last 3 days. CT scan abdomen pelvis showed small bowel obstruction with transition point in the lower posterior abdomen.  Patient also had abnormal EKG, possible GI bleed, sepsis secondary to UTI, AKI.  PMT has been consulted to assist with goals of care conversation.  Clinical Assessment and Goals of Care:  I have reviewed medical records including EPIC notes, labs and imaging, assessed the patient and then had a phone conversation with patient's daughter [name] to discuss diagnosis prognosis, GOC, EOL wishes, disposition and options.  I introduced Palliative Medicine as specialized medical care for people living with serious illness. It focuses on providing relief from the symptoms and stress of a serious illness. The goal is to improve quality of life for both the patient and the family.  We discussed a brief life review of the patient and then focused on their current illness.   I attempted to elicit values and goals of care important to the patient.    Medical History Review and Understanding:  We discussed patient's acute illness as well as her most recent hospitalization.  Patient's daughter seems to understand the severity of patient's illness and high risk  for further decline.  Social History: Patient has been married twice, now divorced.  She has 1 daughter.  She is a [place] and previously enjoyed reading her Bible until her cataract surgery.  She tries to go to church on Sundays as much as she can, though daughter has been unable to accompany her as much as she would like.  Daughter states that "just about anything" makes her happy.  She is described as always in a hurry "to do things" despite being encouraged to slow down.  Functional and Nutritional State: Daughter reports functional decline since a fall patient had in May.  She uses a walker.  Her appetite was good until then as well.  Albumin of 3.4 noted on 9/5.  Palliative Symptoms: Daughter unaware of any symptoms  Advance Directives: A detailed discussion regarding advanced directives was had.  Daughter reports that patient has never completed advanced directives.  Code Status: Concepts specific to code status, artifical feeding and hydration, and rehospitalization were considered and discussed.    Discussion: We had a thorough conversation about patient's current medical management and rationale for poor candidacy for cardiac cath, anticoagulation, endoscopy.  We reviewed that patient is likely approaching a point where the medical team is concerned that further medical interventions are going to cause more harm than good.  She does share that she understands this.  They have never discussed patient's wishes for her care when she does reach this point in her life.  Patient has burial insurance but not much else has been arranged.   Acknowledged difficulty in navigating complex medical decision making without previous guidance from the patient and daughter is appreciative.  Encouraged her to consider patient's personality and other  values.  We discussed options available if patient begins to suffer excessively or does not improve from his acute illness, including comfort focused care.   She is open to discussing this if it becomes necessary.  While continuing with the current care plan, I recommended setting limitations on further escalation of care including ICU admission. [name] agrees that this would be best and to give patient more time with the current care plan.   The difference between aggressive medical intervention and comfort care was considered in light of the patient's goals of care.   Discussed the importance of continued conversation with family and the medical providers regarding overall plan of care and treatment options, ensuring decisions are within the context of the patient's values and GOCs.   Questions and concerns were addressed. The family was encouraged to call with questions or concerns.  PMT will continue to support holistically.   SUMMARY OF RECOMMENDATIONS   -CODE STATUS changed to DNR/DNI -Continue current medical interventions, no escalation or ICU if she continues to decline -Ongoing goals of care discussions pending clinical course; patient's daughter is willing to consider comfort focused care if patient declines -Psychosocial and emotional for provided -PMT will continue to follow and support  Prognosis:  Poor  Discharge Planning: To Be Determined      Primary Diagnoses: Present on Admission:  Upper GI bleed  Small bowel obstruction (HCC)  Crohn's disease (HCC)  AKI (acute kidney injury) (HCC)  HTN (hypertension)  Sepsis secondary to UTI [facility])  Urinary retention  Stroke (cerebrum) (HCC)  Aortic dissection (HCC)    Physical Exam Vitals and nursing note reviewed.  Constitutional:      General: She is not in acute distress.    Appearance: She is ill-appearing.     Interventions: Nasal cannula in place.  Cardiovascular:     Rate and Rhythm: Normal rate.  Pulmonary:     Effort: Pulmonary effort is normal. No respiratory distress.  Skin:    General: Skin is warm and dry.  Neurological:     Mental Status: She is  lethargic.  Psychiatric:        Cognition and Memory: Cognition is impaired.     Comments: Repetitive speech     Vital Signs: BP 139/71   Pulse 81   Temp (!) 97.5 F (36.4 C) (Axillary)   Resp 16   Ht 5\' 5"  (1.651 m)   Wt 52.1 kg   SpO2 100%   BMI 19.10 kg/m  Pain Scale: Faces   Pain Score: 0-No pain   SpO2: SpO2: 100 % O2 Device:SpO2: 100 % O2 Flow Rate: .O2 Flow Rate (L/min): 3 L/min    MDM: High   Kiwanna Spraker Jeni Salles, PA-C  Palliative Medicine Team Team phone # [phone]  Thank you for allowing the Palliative Medicine Team to assist in the care of this patient. Please utilize secure chat with additional questions, if there is no response within 30 minutes please call the above phone number.  Palliative Medicine Team providers are available by phone from 7am to 7pm daily and can be reached through the team cell phone.  Should this patient require assistance outside of these hours, please call the patient's attending physician.

## 2023-02-21 NOTE — Plan of Care (Signed)
  Problem: Clinical Measurements: Goal: Ability to maintain clinical measurements within normal limits will improve Outcome: Progressing Goal: Will remain free from infection Outcome: Progressing Goal: Respiratory complications will improve Outcome: Progressing Goal: Cardiovascular complication will be avoided Outcome: Progressing   Problem: Elimination: Goal: Will not experience complications related to bowel motility Outcome: Progressing Goal: Will not experience complications related to urinary retention Outcome: Progressing   Problem: Pain Managment: Goal: General experience of comfort will improve Outcome: Progressing   Problem: Safety: Goal: Ability to remain free from injury will improve Outcome: Progressing   Problem: Skin Integrity: Goal: Risk for impaired skin integrity will decrease Outcome: Progressing

## 2023-02-21 NOTE — Progress Notes (Signed)
   02/21/23 0520  Vitals  Pulse Rate 98  ECG Heart Rate 99  Resp (!) 23  MEWS COLOR  MEWS Score Color Yellow  Oxygen Therapy  SpO2 99 %  MEWS Score  MEWS Temp 0  MEWS Systolic 0  MEWS Pulse 0  MEWS RR 1  MEWS LOC 1  MEWS Score 2  Provider Notification  Provider Name/Title Dr. Joneen Roach  Date Provider Notified 02/21/23  Time Provider Notified [phone]  Method of Notification Page [facility] chat)  Notification Reason Critical Result  Test performed and critical result Hemoglobin 6.8  Date Critical Result Received 02/21/23  Time Critical Result Received 0520  Provider response See new orders  Date of Provider Response 02/21/23  Time of Provider Response 0530   Received critical lab value. Noted black tarry stool. Notified Dr. Joneen Roach and made new orders.

## 2023-02-21 NOTE — Progress Notes (Signed)
   02/20/23 2342  Assess: MEWS Score  Temp 99 F (37.2 C)  BP 137/62  MAP (mmHg) 84  Pulse Rate (!) 102  ECG Heart Rate (!) 103  Resp 14  Level of Consciousness Responds to Voice  SpO2 100 %  O2 Device Nasal Cannula  Assess: MEWS Score  MEWS Temp 0  MEWS Systolic 0  MEWS Pulse 1  MEWS RR 0  MEWS LOC 1  MEWS Score 2  MEWS Score Color Yellow  Assess: if the MEWS score is Yellow or Red  Were vital signs accurate and taken at a resting state? Yes  Does the patient meet 2 or more of the SIRS criteria? No  MEWS guidelines implemented  Yes, yellow  Treat  MEWS Interventions Considered administering scheduled or prn medications/treatments as ordered  Take Vital Signs  Increase Vital Sign Frequency  Yellow: Q2hr x1, continue Q4hrs until patient remains green for 12hrs  Escalate  MEWS: Escalate Yellow: Discuss with charge nurse and consider notifying provider and/or RRT  Notify: Charge Nurse/RN  Name of Charge Nurse/RN Notified Dana, RN  Assess: SIRS CRITERIA  SIRS Temperature  0  SIRS Pulse 1  SIRS Respirations  0  SIRS WBC 0  SIRS Score Sum  1

## 2023-02-22 DIAGNOSIS — Z7189 Other specified counseling: Secondary | ICD-10-CM

## 2023-02-22 DIAGNOSIS — Z515 Encounter for palliative care: Secondary | ICD-10-CM | POA: Diagnosis not present

## 2023-02-22 DIAGNOSIS — K56609 Unspecified intestinal obstruction, unspecified as to partial versus complete obstruction: Secondary | ICD-10-CM | POA: Diagnosis not present

## 2023-02-22 LAB — BLOOD CULTURE ID PANEL (REFLEXED) - BCID2

## 2023-02-22 LAB — TYPE AND SCREEN
ABO/RH(D): O POS
Antibody Screen: NEGATIVE
Unit division: 0
Unit division: 0

## 2023-02-22 LAB — BPAM RBC
Blood Product Expiration Date: 202409262359
Blood Product Expiration Date: 202410042359
ISSUE DATE / TIME: 202409070920
ISSUE DATE / TIME: 202409071503
Unit Type and Rh: 5100
Unit Type and Rh: 5100

## 2023-02-22 LAB — CBC WITH DIFFERENTIAL/PLATELET
Abs Immature Granulocytes: 0.06 10*3/uL (ref 0.00–0.07)
Basophils Absolute: 0 10*3/uL (ref 0.0–0.1)
Basophils Relative: 0 %
Eosinophils Absolute: 0.4 10*3/uL (ref 0.0–0.5)
Eosinophils Relative: 4 %
HCT: 37.5 % (ref 36.0–46.0)
Hemoglobin: 12.4 g/dL (ref 12.0–15.0)
Immature Granulocytes: 1 %
Lymphocytes Relative: 10 %
Lymphs Abs: 1 10*3/uL (ref 0.7–4.0)
MCH: 31.2 pg (ref 26.0–34.0)
MCHC: 33.1 g/dL (ref 30.0–36.0)
MCV: 94.5 fL (ref 80.0–100.0)
Monocytes Absolute: 0.6 10*3/uL (ref 0.1–1.0)
Monocytes Relative: 6 %
Neutro Abs: 7.8 10*3/uL — ABNORMAL HIGH (ref 1.7–7.7)
Neutrophils Relative %: 79 %
Platelets: 169 10*3/uL (ref 150–400)
RBC: 3.97 MIL/uL (ref 3.87–5.11)
RDW: 20.1 % — ABNORMAL HIGH (ref 11.5–15.5)
WBC: 9.9 10*3/uL (ref 4.0–10.5)
nRBC: 0 % (ref 0.0–0.2)

## 2023-02-22 LAB — COMPREHENSIVE METABOLIC PANEL
ALT: 17 U/L (ref 0–44)
AST: 25 U/L (ref 15–41)
Albumin: 2 g/dL — ABNORMAL LOW (ref 3.5–5.0)
Alkaline Phosphatase: 31 U/L — ABNORMAL LOW (ref 38–126)
Anion gap: 7 (ref 5–15)
BUN: 13 mg/dL (ref 8–23)
CO2: 24 mmol/L (ref 22–32)
Calcium: 8.1 mg/dL — ABNORMAL LOW (ref 8.9–10.3)
Chloride: 111 mmol/L (ref 98–111)
Creatinine, Ser: 0.83 mg/dL (ref 0.44–1.00)
GFR, Estimated: >60 mL/min (ref >60)
Glucose, Bld: 113 mg/dL — ABNORMAL HIGH (ref 70–99)
Potassium: 3.7 mmol/L (ref 3.5–5.1)
Sodium: 142 mmol/L (ref 135–145)
Total Bilirubin: 0.4 mg/dL (ref 0.3–1.2)
Total Protein: 4.5 g/dL — ABNORMAL LOW (ref 6.5–8.1)

## 2023-02-22 LAB — GLUCOSE, CAPILLARY
Glucose-Capillary: 128 mg/dL — ABNORMAL HIGH (ref 70–99)
Glucose-Capillary: 98 mg/dL (ref 70–99)
Glucose-Capillary: 98 mg/dL (ref 70–99)

## 2023-02-22 LAB — URINE CULTURE: Culture: >=100000 — AB

## 2023-02-22 LAB — MAGNESIUM: Magnesium: 1.6 mg/dL — ABNORMAL LOW (ref 1.7–2.4)

## 2023-02-22 MED ORDER — SALINE SPRAY 0.65 % NA SOLN
1.0000 | NASAL | Status: DC | PRN
  Administered 2023-02-22: 1 via NASAL
  Filled 2023-02-22: qty 44

## 2023-02-22 MED ORDER — MAGNESIUM SULFATE 2 GM/50ML IV SOLN
2.0000 g | Freq: Once | INTRAVENOUS | Status: AC
  Administered 2023-02-22: 2 g via INTRAVENOUS
  Filled 2023-02-22: qty 50

## 2023-02-22 MED ORDER — MORPHINE SULFATE (PF) 2 MG/ML IV SOLN
1.0000 mg | INTRAVENOUS | Status: DC | PRN
  Administered 2023-02-22 – 2023-02-26 (×15): 1 mg via INTRAVENOUS
  Filled 2023-02-22 (×16): qty 1

## 2023-02-22 MED ORDER — FLUCONAZOLE IN SODIUM CHLORIDE 400-0.9 MG/200ML-% IV SOLN
800.0000 mg | Freq: Once | INTRAVENOUS | Status: DC
  Filled 2023-02-22: qty 400

## 2023-02-22 MED ORDER — SODIUM CHLORIDE 0.9 % IV SOLN
100.0000 mg | INTRAVENOUS | Status: DC
  Administered 2023-02-22 – 2023-02-26 (×5): 100 mg via INTRAVENOUS
  Filled 2023-02-22 (×6): qty 5

## 2023-02-22 MED ORDER — POTASSIUM PHOSPHATES 15 MMOLE/5ML IV SOLN
20.0000 mmol | Freq: Once | INTRAVENOUS | Status: AC
  Administered 2023-02-22: 20 mmol via INTRAVENOUS
  Filled 2023-02-22: qty 6.67

## 2023-02-22 MED ORDER — FLUCONAZOLE IN SODIUM CHLORIDE 400-0.9 MG/200ML-% IV SOLN: 400.0000 mg | INTRAVENOUS | Status: DC

## 2023-02-22 NOTE — Progress Notes (Signed)
   Subjective/Chief Complaint: Having flatus, tol liquids   Objective: Vital signs in last 24 hours: Temp:  [97.5 F (36.4 C)-98.6 F (37 C)] 98 F (36.7 C) (09/08 0828) Pulse Rate:  [73-92] 88 (09/08 0828) Resp:  [13-93] 18 (09/08 0828) BP: (112-143)/(64-77) 137/71 (09/08 0828) SpO2:  [94 %-100 %] 94 % (09/08 0828) Last BM Date : 02/21/23  Intake/Output from previous day: 09/07 0701 - 09/08 0700 In: 2395.2 [P.O.:200; I.V.:1246; Blood:800.4; IV Piggyback:148.8] Out: 350 [Urine:350] Intake/Output this shift: No intake/output data recorded.  Ab soft nontender nd   Lab Results:  Recent Labs    02/21/23 0414 02/22/23 0446  WBC 11.2* 9.9  HGB 6.8* 12.4  HCT 22.6* 37.5  PLT 214 169   BMET Recent Labs    02/21/23 0414 02/22/23 0446  NA 143 142  K 2.9* 3.7  CL 112* 111  CO2 24 24  GLUCOSE 140* 113*  BUN 20 13  CREATININE 0.77 0.83  CALCIUM 8.0* 8.1*   PT/INR No results for input(s): "LABPROT", "INR" in the last 72 hours. ABG No results for input(s): "PHART", "HCO3" in the last 72 hours.  Invalid input(s): "PCO2", "PO2"  Studies/Results: DG Abd Portable 1V-Small Bowel Obstruction Protocol-initial, 8 hr delay  Result Date: 02/20/2023 CLINICAL DATA:  SBO 8 hour delay EXAM: PORTABLE ABDOMEN - 1 VIEW COMPARISON:  02/20/2023, CT 02/19/2023 FINDINGS: Esophageal tube tip at the distal esophagus. Mild central small bowel distension. Enteral contrast appears within the colon. Partially visualized left hip replacement IMPRESSION: 1. Esophageal tube tip at the distal esophageal region 2. Enteral contrast visible in the colon. Mild residual small bowel distension but probably decreased compared to prior radiograph Electronically Signed   By: Jasmine Pang M.D.   On: 02/20/2023 21:24    Anti-infectives: Anti-infectives (From admission, onward)    Start     Dose/Rate Route Frequency Ordered Stop   02/19/23 2200  piperacillin-tazobactam (ZOSYN) IVPB 3.375 g        3.375  g 12.5 mL/hr over 240 Minutes Intravenous Every 8 hours 02/19/23 1450     02/19/23 1445  piperacillin-tazobactam (ZOSYN) IVPB 3.375 g        3.375 g 100 mL/hr over 30 Minutes Intravenous  Once 02/19/23 1433 02/19/23 1630       Assessment/Plan: pSBO with possible UGI bleed -8-hr delay films reviewed and contrast in her colon.  She is moving her bowels -BM was black, tarry per RN report and hgb 6.8.  getting blood.  Apparently  had a recent UGI bleed.  Hct went up a lot with blood, not sure if initial correct -adat    FEN - CLD VTE - on hold due to anemia ID - zosyn, for UTI   UTI Recent CVA EKG changes HTN Crohn's disease AKI Anemia    I reviewed Consultant cardiology notes, hospitalist notes, last 24 h vitals and pain scores, last 48 h intake and output, last 24 h labs and trends, and last 24 h imaging results.  Emelia Loron 02/22/2023

## 2023-02-22 NOTE — Progress Notes (Signed)
PROGRESS NOTE    [name]  FAO:[id number] DOB: 1945/09/11 DOA: 02/19/2023 PCP: Assunta Found, MD    Brief Narrative:  77 year old with history of previous GI bleed, hypertension, Crohn's disease, recent stroke with left hemiparesis, recent admission to the hospital 8/24-8/30 for acute stroke and discharged to a skilled nursing home.  She presents back to the emergency room from SNF with abdominal pain, distention and nausea vomiting over the last 3 days.  Also reported headache and back pain. At the emergency room normotensive.  Normothermic.  Heart rate 111-127.  Oxygen 93% on room air.  After given a dose of fentanyl, oxygen saturations dropped to 87%.  Leukocytosis with white blood cell count of 29,000.  Potassium 3.4.  Patient was started on IV fluid boluses, cultures were collected and is started on IV Zosyn.  She had 2200 mL urine, Foley was inserted.  CT scan abdomen pelvis showed small bowel obstruction with transition point in the lower posterior abdomen. Initial EKG suggested ST elevation, ruled out.  Patient was ultimately transferred to [facility] for higher level of care. Conservatively treated.  Remains in poor clinical status. 9/6, drop in hemoglobin and black tarry stool.  Blood transfusion started. Blood cultures with yeast Urine culture with E. coli.   Assessment & Plan:   Partial small bowel obstruction due to adhesions: With clinical evidence of improvement.  Tolerating diet today.  Surgery following.  Bowel functions returned.    Sepsis present on admission secondary to UTI: UA consistent with UTI.  Blood cultures 9/5 growing yeast.  Start micafungin.  Repeat blood cultures today.  Will wait until final cultures to do further investigations.Urine culture growing E. coli.  Continue Zosyn until clinical improvement.  Abnormal EKG with ST elevation: Seen by cardiology.  Mildly elevated troponins, echo findings unremarkable.  Normal ejection fraction.  Patient  with acute small bowel obstruction, recent stroke.  Symptomatic treatment.  Not a candidate for anticoagulation or left heart cath.  Will continue to follow.  Cardiology following.  Even not able to tolerate antiplatelet therapy due to below.  Acute GI bleeding: Suspect lower GI bleeding.  NG tube suction without evidence of bleeding. Hemoglobin dropped from recent baseline of 11-13-6.8.  Reported black tarry stool. Patient is not a candidate for anesthesia endoscopy now. Started on PPI twice daily. Hemoglobin 6.8-dramatic response with 2 units of PRBC with hemoglobin 12.  Likely false results.  Recheck tomorrow.  Currently no evidence of ongoing bleeding.  Hypokalemia:  Hypomagnesemia:  Hypophosphatemia:  Replace aggressively.  Monitor levels.    Chronic medical issues including AKI, continue IV fluids. History of stroke, hold aspirin and Plavix. Hypertension, blood pressure stable.  Holding antihypertensives. Chron's disease, holding sulfasalazine.  Acute urinary retention: Foley catheter.  Continue until clinical improvement.  Goal of care: Remains in very poor clinical status. History of GI bleeding, recent stroke. unable to be anticoagulated or put on antiplatelet therapy.  Poor prognosis. Family updated including daughter. Palliative care following. Followed by palliative care, changed to DNR with limited intervention.  If no improvement or deterioration, will treat with palliation and hospice.   DVT prophylaxis: SCDs Start: 02/20/23 0338   Code Status: DNR/DNI.  Limited intervention. Family Communication: None today.  Palliative provider updated family. Disposition Plan: Status is: Inpatient Remains inpatient appropriate because: Multiple acute medical issues.     Consultants:  General Surgery Cardiology Palliative  Procedures:  None  Antimicrobials:  Zosyn 9/5--- Micafungin 9/8--   Subjective:  Patient seen and  examined.  She is more awake and interactive  today.  Able to answer questions appropriately.  Complains of neck pain and lays on her right face difficult to move around.  Will start PT OT.  Heart rate remains stable.  Patient denies any pain.  Cannot move left side.  Objective: Vitals:   02/22/23 0005 02/22/23 0353 02/22/23 0828 02/22/23 1310  BP: 112/64 122/69 137/71 133/76  Pulse: 73 81 88 83  Resp: 15 18 18 20   Temp: 97.6 F (36.4 C) 97.9 F (36.6 C) 98 F (36.7 C) 98 F (36.7 C)  TempSrc: Axillary Axillary Axillary Axillary  SpO2: 99% 100% 94% 97%  Weight:      Height:        Intake/Output Summary (Last 24 hours) at 02/22/2023 1429 Last data filed at 02/22/2023 0600 Gross per 24 hour  Intake 2019.36 ml  Output 350 ml  Net 1669.36 ml   Filed Weights   02/19/23 1403 02/20/23 0318  Weight: 54.6 kg 52.1 kg    Examination:  General exam: Sick looking.  Alert awake and mostly oriented today. Respiratory system: No added sounds. Cardiovascular system: S1 & S2 heard, RRR.  Gastrointestinal system: Soft.  Nontender.  Bowel sound present. Central nervous system: Alert , anxious.  Grossly weak on left side.  Foley catheter with clear urine.    Data Reviewed: I have personally reviewed following labs and imaging studies  CBC: Recent Labs  Lab 02/19/23 1415 02/19/23 1424 02/20/23 0150 02/20/23 0642 02/21/23 0414 02/22/23 0446  WBC 29.2*  --   --  20.2* 11.2* 9.9  NEUTROABS 26.8*  --   --   --  9.4* 7.8*  HGB 9.5* 10.2* 7.5* 7.6* 6.8* 12.4  HCT 28.1* 30.0* 22.0* 23.9* 22.6* 37.5  MCV 102.9*  --   --  108.1* 109.7* 94.5  PLT 343  --   --  220 214 169   Basic Metabolic Panel: Recent Labs  Lab 02/19/23 1415 02/19/23 1424 02/20/23 0150 02/20/23 0642 02/21/23 0414 02/22/23 0446  NA 136 135 141 141 143 142  K 3.4* 3.6 2.7* 2.6* 2.9* 3.7  CL 93* 96* 103 106 112* 111  CO2 22  --   --  24 24 24   GLUCOSE 136* 133* 109* 126* 140* 113*  BUN 51* 47* 39* 36* 20 13  CREATININE 1.38* 1.50* 1.10* 1.02* 0.77 0.83   CALCIUM 9.6  --   --  8.5* 8.0* 8.1*  MG  --   --   --   --  1.7 1.6*  PHOS  --   --   --   --  1.5*  --    GFR: Estimated Creatinine Clearance: 45.9 mL/min (by C-G formula based on SCr of 0.83 mg/dL). Liver Function Tests: Recent Labs  Lab 02/19/23 1415 02/22/23 0446  AST 29 25  ALT 23 17  ALKPHOS 49 31*  BILITOT 1.0 0.4  PROT 6.2* 4.5*  ALBUMIN 3.4* 2.0*   Recent Labs  Lab 02/19/23 1415  LIPASE 64*   No results for input(s): "AMMONIA" in the last 168 hours. Coagulation Profile: No results for input(s): "INR", "PROTIME" in the last 168 hours. Cardiac Enzymes: No results for input(s): "CKTOTAL", "CKMB", "CKMBINDEX", "TROPONINI" in the last 168 hours. BNP (last 3 results) No results for input(s): "PROBNP" in the last 8760 hours. HbA1C: No results for input(s): "HGBA1C" in the last 72 hours. CBG: Recent Labs  Lab 02/20/23 1158 02/20/23 1612 02/21/23 0758 02/22/23 0127 02/22/23 1051  GLUCAP  106* 108* 138* 128* 98   Lipid Profile: No results for input(s): "CHOL", "HDL", "LDLCALC", "TRIG", "CHOLHDL", "LDLDIRECT" in the last 72 hours. Thyroid Function Tests: No results for input(s): "TSH", "T4TOTAL", "FREET4", "T3FREE", "THYROIDAB" in the last 72 hours. Anemia Panel: No results for input(s): "VITAMINB12", "FOLATE", "FERRITIN", "TIBC", "IRON", "RETICCTPCT" in the last 72 hours. Sepsis Labs: Recent Labs  Lab 02/19/23 2139 02/20/23 0149  LATICACIDVEN 1.3 1.1    Recent Results (from the past 240 hour(s))  SARS Coronavirus 2 by RT PCR (hospital order, performed in [facility] hospital lab) *cepheid single result test* Anterior Nasal Swab     Status: None   Collection Time: 02/19/23  2:13 PM   Specimen: Anterior Nasal Swab  Result Value Ref Range Status   SARS Coronavirus 2 by RT PCR NEGATIVE NEGATIVE Final    Comment: (NOTE) SARS-CoV-2 target nucleic acids are NOT DETECTED.  The SARS-CoV-2 RNA is generally detectable in upper and lower respiratory specimens  during the acute phase of infection. The lowest concentration of SARS-CoV-2 viral copies this assay can detect is 250 copies / mL. A negative result does not preclude SARS-CoV-2 infection and should not be used as the sole basis for treatment or other patient management decisions.  A negative result may occur with improper specimen collection / handling, submission of specimen other than nasopharyngeal swab, presence of viral mutation(s) within the areas targeted by this assay, and inadequate number of viral copies (<250 copies / mL). A negative result must be combined with clinical observations, patient history, and epidemiological information.  Fact Sheet for Patients:   RoadLapTop.co.za  Fact Sheet for Healthcare Providers: http://kim-miller.com/  This test is not yet approved or  cleared by the [place] FDA and has been authorized for detection and/or diagnosis of SARS-CoV-2 by FDA under an Emergency Use Authorization (EUA).  This EUA will remain in effect (meaning this test can be used) for the duration of the COVID-19 declaration under Section 564(b)(1) of the Act, 21 U.S.C. section 360bbb-3(b)(1), unless the authorization is terminated or revoked sooner.  Performed at [facility], [address], [place], [place] [address]   Culture, blood (Routine X 2) w Reflex to ID Panel     Status: None (Preliminary result)   Collection Time: 02/19/23  9:30 PM   Specimen: BLOOD  Result Value Ref Range Status   Specimen Description BLOOD BLOOD RIGHT HAND  Final   Special Requests   Final    BOTTLES DRAWN AEROBIC ONLY Blood Culture results may not be optimal due to an inadequate volume of blood received in culture bottles   Culture   Final    NO GROWTH 3 DAYS Performed at [facility], [address], [place], [place] [address]    Report Status PENDING  Incomplete  Urine Culture (for pregnant, neutropenic or urologic patients or patients  with an indwelling urinary catheter)     Status: Abnormal   Collection Time: 02/19/23  9:30 PM   Specimen: Urine, Catheterized  Result Value Ref Range Status   Specimen Description   Final    URINE, CATHETERIZED Performed at [facility], [address] [place], [place] [address]    Special Requests   Final    NONE Performed at [facility], [address], [place], [place] [address]    Culture >=100,000 COLONIES/mL ESCHERICHIA COLI (A)  Final   Report Status 02/22/2023 FINAL  Final   Organism ID, Bacteria ESCHERICHIA COLI (A)  Final      Susceptibility  Escherichia coli - MIC*    AMPICILLIN >=32 RESISTANT Resistant     CEFAZOLIN <=4 SENSITIVE Sensitive     CEFEPIME <=0.12 SENSITIVE Sensitive     CEFTRIAXONE <=0.25 SENSITIVE Sensitive     CIPROFLOXACIN >=4 RESISTANT Resistant     GENTAMICIN <=1 SENSITIVE Sensitive     IMIPENEM <=0.25 SENSITIVE Sensitive     NITROFURANTOIN <=16 SENSITIVE Sensitive     TRIMETH/SULFA <=20 SENSITIVE Sensitive     AMPICILLIN/SULBACTAM 8 SENSITIVE Sensitive     PIP/TAZO <=4 SENSITIVE Sensitive     * >=100,000 COLONIES/mL ESCHERICHIA COLI  Culture, blood (Routine X 2) w Reflex to ID Panel     Status: None (Preliminary result)   Collection Time: 02/19/23 11:19 PM   Specimen: BLOOD  Result Value Ref Range Status   Specimen Description   Final    BLOOD LEFT ANTECUBITAL Performed at [facility], [address] [place], [place] [address]    Special Requests   Final    BOTTLES DRAWN AEROBIC AND ANAEROBIC Blood Culture results may not be optimal due to an excessive volume of blood received in culture bottles Performed at [facility], [address] [place], [place] [address]    Culture  Setup Time   Final    YEAST AEROBIC BOTTLE ONLY Gram Stain Report Called to,Read Back By and Verified With: L WALKER AT [facility] AT 0925 ON [id number] BY S [place] CRITICAL RESULT CALLED TO, READ BACK BY AND VERIFIED WITH: PHARMD JESSICA MILLEN [phone] AT 1414 BY  EC Performed at [facility], [address], [place], [place] [address]    Culture YEAST  Final   Report Status PENDING  Incomplete  Blood Culture ID Panel (Reflexed)     Status: Abnormal   Collection Time: 02/19/23 11:19 PM  Result Value Ref Range Status   Enterococcus faecalis NOT DETECTED NOT DETECTED Final   Enterococcus Faecium NOT DETECTED NOT DETECTED Final   Listeria monocytogenes NOT DETECTED NOT DETECTED Final   Staphylococcus species NOT DETECTED NOT DETECTED Final   Staphylococcus aureus (BCID) NOT DETECTED NOT DETECTED Final   Staphylococcus epidermidis NOT DETECTED NOT DETECTED Final   Staphylococcus lugdunensis NOT DETECTED NOT DETECTED Final   Streptococcus species NOT DETECTED NOT DETECTED Final   Streptococcus agalactiae NOT DETECTED NOT DETECTED Final   Streptococcus pneumoniae NOT DETECTED NOT DETECTED Final   Streptococcus pyogenes NOT DETECTED NOT DETECTED Final   A.calcoaceticus-baumannii NOT DETECTED NOT DETECTED Final   Bacteroides fragilis NOT DETECTED NOT DETECTED Final   Enterobacterales NOT DETECTED NOT DETECTED Final   Enterobacter cloacae complex NOT DETECTED NOT DETECTED Final   Escherichia coli NOT DETECTED NOT DETECTED Final   Klebsiella aerogenes NOT DETECTED NOT DETECTED Final   Klebsiella oxytoca NOT DETECTED NOT DETECTED Final   Klebsiella pneumoniae NOT DETECTED NOT DETECTED Final   Proteus species NOT DETECTED NOT DETECTED Final   Salmonella species NOT DETECTED NOT DETECTED Final   Serratia marcescens NOT DETECTED NOT DETECTED Final   Haemophilus influenzae NOT DETECTED NOT DETECTED Final   Neisseria meningitidis NOT DETECTED NOT DETECTED Final   Pseudomonas aeruginosa NOT DETECTED NOT DETECTED Final   Stenotrophomonas maltophilia NOT DETECTED NOT DETECTED Final   Candida albicans NOT DETECTED NOT DETECTED Final   Candida auris NOT DETECTED NOT DETECTED Final   Candida glabrata DETECTED (A) NOT DETECTED Final    Comment: CRITICAL  RESULT CALLED TO, READ BACK BY AND VERIFIED WITH: PHARMD JESSICA MILLEN [phone] AT 1414 BY EC  Candida krusei NOT DETECTED NOT DETECTED Final   Candida parapsilosis NOT DETECTED NOT DETECTED Final   Candida tropicalis NOT DETECTED NOT DETECTED Final   Cryptococcus neoformans/gattii NOT DETECTED NOT DETECTED Final    Comment: Performed at [facility], [address], [place], [place] [address]         Radiology Studies: DG Abd Portable 1V-Small Bowel Obstruction Protocol-initial, 8 hr delay  Result Date: 02/20/2023 CLINICAL DATA:  SBO 8 hour delay EXAM: PORTABLE ABDOMEN - 1 VIEW COMPARISON:  02/20/2023, CT 02/19/2023 FINDINGS: Esophageal tube tip at the distal esophagus. Mild central small bowel distension. Enteral contrast appears within the colon. Partially visualized left hip replacement IMPRESSION: 1. Esophageal tube tip at the distal esophageal region 2. Enteral contrast visible in the colon. Mild residual small bowel distension but probably decreased compared to prior radiograph Electronically Signed   By: Jasmine Pang M.D.   On: 02/20/2023 21:24        Scheduled Meds:  Chlorhexidine Gluconate Cloth  6 each Topical Daily   pantoprazole (PROTONIX) IV  40 mg Intravenous Q12H   Continuous Infusions:  sodium chloride Stopped (02/20/23 1319)   dextrose 5 % and 0.9 % NaCl with KCl 20 mEq/L 75 mL/hr at 02/21/23 0813   magnesium sulfate bolus IVPB     micafungin (MYCAMINE) 100 mg in sodium chloride 0.9 % 100 mL IVPB 100 mg (02/22/23 1420)   piperacillin-tazobactam (ZOSYN)  IV 3.375 g (02/22/23 1414)   potassium PHOSPHATE IVPB (in mmol)       LOS: 2 days    Time spent: 35 minutes    Dorcas Carrow, MD [facility]

## 2023-02-22 NOTE — Progress Notes (Signed)
Yeast on aerobic bottle per [facility] MD informed

## 2023-02-22 NOTE — Progress Notes (Signed)
Pharmacy Antimicrobial Note   [name] is a 77 y.o. female admitted on 02/19/2023 with  an intra-abdominal infection and candidemia .  Pharmacy has been consulted for Zosyn and micafungin dosing.   Plan: Micafungin 100 mg IV q24h Zosyn 3.375 gm IV q8h (4 hour infusion) Monitor bcx and renal function F/u abx LOT and narrow abx as able   Height: 5\' 5"  (165.1 cm) Weight: 52.1 kg (114 lb 12.8 oz) IBW/kg (Calculated) : 57   Temp (24hrs), Avg:98.1 F (36.7 C), Min:97.5 F (36.4 C), Max:98.6 F (37 C)   Last Labs            Recent Labs  Lab 02/19/23 1415 02/19/23 1424 02/19/23 2139 02/20/23 0149 02/20/23 0150 02/20/23 0642 02/21/23 0414 02/22/23 0446  WBC 29.2*  --   --   --   --  20.2* 11.2* 9.9  CREATININE 1.38* 1.50*  --   --  1.10* 1.02* 0.77 0.83  LATICACIDVEN  --   --  1.3 1.1  --   --   --   --       Estimated Creatinine Clearance: 45.9 mL/min (by C-G formula based on SCr of 0.83 mg/dL).     Allergies       Allergies  Allergen Reactions   Flagyl [Metronidazole Hcl] Other (See Comments)      "makes me feel whoozy and weird"   Other        Unknown antibiotic and unknown pain medications          Antimicrobials this admission: 9/5 Zosyn >>  9/8 Micafungin >>    Microbiology results: 9/5 BCx: yeast 9/5 UCx: E Coli    Thank you for allowing pharmacy to be a part of this patient's care.  Enos Fling, PharmD PGY-1 Acute Care Pharmacy Resident 02/22/2023 12:03 PM

## 2023-02-22 NOTE — Progress Notes (Deleted)
Pharmacy Antimicrobial Note  [name] is a 77 y.o. female admitted on 02/19/2023 with  an intra-abdominal infection and candidemia .  Pharmacy has been consulted for Zosyn and fluconazole dosing.  Plan: Fluconazole 800 mg IV x1, then fluconazole IV 400 mg every 24 hours Zosyn 3.375 gm IV q8h (4 hour infusion) Monitor bcx and renal function F/u abx LOT and narrow abx as able  Height: 5\' 5"  (165.1 cm) Weight: 52.1 kg (114 lb 12.8 oz) IBW/kg (Calculated) : 57  Temp (24hrs), Avg:98.1 F (36.7 C), Min:97.5 F (36.4 C), Max:98.6 F (37 C)  Recent Labs  Lab 02/19/23 1415 02/19/23 1424 02/19/23 2139 02/20/23 0149 02/20/23 0150 02/20/23 0642 02/21/23 0414 02/22/23 0446  WBC 29.2*  --   --   --   --  20.2* 11.2* 9.9  CREATININE 1.38* 1.50*  --   --  1.10* 1.02* 0.77 0.83  LATICACIDVEN  --   --  1.3 1.1  --   --   --   --     Estimated Creatinine Clearance: 45.9 mL/min (by C-G formula based on SCr of 0.83 mg/dL).    Allergies  Allergen Reactions   Flagyl [Metronidazole Hcl] Other (See Comments)    "makes me feel whoozy and weird"   Other     Unknown antibiotic and unknown pain medications     Antimicrobials this admission: 9/5 Zosyn >>  9/8 Fluconazole >>   Microbiology results: 9/5 BCx: yeast 9/5 UCx: E Coli   Thank you for allowing pharmacy to be a part of this patient's care.  Enos Fling, PharmD PGY-1 Acute Care Pharmacy Resident 02/22/2023 10:06 AM

## 2023-02-22 NOTE — Plan of Care (Signed)
  Problem: Clinical Measurements: Goal: Ability to maintain clinical measurements within normal limits will improve Outcome: Progressing Goal: Respiratory complications will improve Outcome: Progressing Goal: Cardiovascular complication will be avoided Outcome: Progressing   Problem: Elimination: Goal: Will not experience complications related to urinary retention Outcome: Progressing   Problem: Safety: Goal: Ability to remain free from injury will improve Outcome: Progressing   Problem: Skin Integrity: Goal: Risk for impaired skin integrity will decrease Outcome: Progressing

## 2023-02-22 NOTE — Progress Notes (Signed)
   Palliative Medicine Inpatient Follow Up Note HPI: 77 y.o. female  with past medical history of GI bleed, hypertension, Crohn's, recent stroke with left hemiparesis, small bowel obstruction admitted on 02/19/2023 with abdominal pain.    Recent admission to the hospital 8/24-8/30 for acute stroke and discharged to a skilled nursing home. She presents back to the emergency room from SNF with abdominal pain, distention and nausea vomiting over the last 3 days. CT scan abdomen pelvis showed small bowel obstruction with transition point in the lower posterior abdomen.  Patient also had abnormal EKG, possible GI bleed, sepsis secondary to UTI, AKI.   PMT has been consulted to assist with goals of care conversation  Today's Discussion 02/22/2023  *Please note that this is a verbal dictation therefore any spelling or grammatical errors are due to the "[employer]" system interpretation.  Chart reviewed inclusive of vital signs, progress notes, laboratory results, and diagnostic images.   I met with Maneh this afternoon. She is awake and alert. She denies pain in her abdomen, nausea, or SOB. She shares that she is having a headache and feels "stuffed up". We discussed getting her some tylenol and nasal spray.   Created space and opportunity for patient to explore thoughts feelings and fears regarding current medical situation.  I called and spoke with patients daughter, [name] who shares that from her understanding through speaking to the RN, Conswello is doing better today. We reviewed her labs and it was decided to continue present course of treatment.   Questions and concerns addressed/Palliative Support Provided.   Objective Assessment: Vital Signs Vitals:   02/22/23 0828 02/22/23 1310  BP: 137/71 133/76  Pulse: 88 83  Resp: 18 20  Temp: 98 F (36.7 C) 98 F (36.7 C)  SpO2: 94% 97%    Intake/Output Summary (Last 24 hours) at 02/22/2023 1343 Last data filed at 02/22/2023 0600 Gross per 24  hour  Intake 2019.36 ml  Output 350 ml  Net 1669.36 ml   Last Weight  Most recent update: 02/20/2023  4:50 AM    Weight  52.1 kg (114 lb 12.8 oz)            Gen:  Frail elderly caucasian F in NAD HEENT: moist mucous membranes CV: Regular rate and rhythm  PULM:  On RA, breathing is even and non-labored ABD: soft/nontender  EXT: No edema  Neuro: Alert and oriented x3   SUMMARY OF RECOMMENDATIONS   DNAR/DNI  Continue to allow time for outcomes  Awaiting PT/OT evaluations  Will benefit on discharge from OP Palliative support  PMT will continue to follow along  Billing based on MDM: Moderate ______________________________________________________________________________________ [name]  Palliative Medicine Team Team Cell Phone: [phone] Please utilize secure chat with additional questions, if there is no response within 30 minutes please call the above phone number  Palliative Medicine Team providers are available by phone from 7am to 7pm daily and can be reached through the team cell phone.  Should this patient require assistance outside of these hours, please call the patient's attending physician.

## 2023-02-22 NOTE — Progress Notes (Signed)
PHARMACY - PHYSICIAN COMMUNICATION CRITICAL VALUE ALERT - BLOOD CULTURE IDENTIFICATION (BCID)  [name] is an 77 y.o. female who presented to [facility] on 02/19/2023 with a chief complaint of abdominal pain.   Assessment:  Patient has a PMH significant for Crohn's disease and prior GI bleed. Patient admitted 8/24-8/30 for acute stroke and discharge to a SNF. Patient was admitted due to a small  bowel obstruction on 02/19/23. Urine cultures have >100,000 colonies E. Coli and blood cultures growing Candida glabrata.   Name of physician (or Provider) Contacted: Dr. Kara Mead  Current antibiotics: Zosyn 3.375 gm every 8 hours Micafungin 100 mg IV q24h  Changes to prescribed antibiotics recommended:  Patient is on recommended antibiotics - No changes needed  Results for orders placed or performed during the hospital encounter of 02/19/23  Blood Culture ID Panel (Reflexed) (Collected: 02/19/2023 11:19 PM)  Result Value Ref Range   Enterococcus faecalis NOT DETECTED NOT DETECTED   Enterococcus Faecium NOT DETECTED NOT DETECTED   Listeria monocytogenes NOT DETECTED NOT DETECTED   Staphylococcus species NOT DETECTED NOT DETECTED   Staphylococcus aureus (BCID) NOT DETECTED NOT DETECTED   Staphylococcus epidermidis NOT DETECTED NOT DETECTED   Staphylococcus lugdunensis NOT DETECTED NOT DETECTED   Streptococcus species NOT DETECTED NOT DETECTED   Streptococcus agalactiae NOT DETECTED NOT DETECTED   Streptococcus pneumoniae NOT DETECTED NOT DETECTED   Streptococcus pyogenes NOT DETECTED NOT DETECTED   A.calcoaceticus-baumannii NOT DETECTED NOT DETECTED   Bacteroides fragilis NOT DETECTED NOT DETECTED   Enterobacterales NOT DETECTED NOT DETECTED   Enterobacter cloacae complex NOT DETECTED NOT DETECTED   Escherichia coli NOT DETECTED NOT DETECTED   Klebsiella aerogenes NOT DETECTED NOT DETECTED   Klebsiella oxytoca NOT DETECTED NOT DETECTED   Klebsiella pneumoniae NOT DETECTED NOT  DETECTED   Proteus species NOT DETECTED NOT DETECTED   Salmonella species NOT DETECTED NOT DETECTED   Serratia marcescens NOT DETECTED NOT DETECTED   Haemophilus influenzae NOT DETECTED NOT DETECTED   Neisseria meningitidis NOT DETECTED NOT DETECTED   Pseudomonas aeruginosa NOT DETECTED NOT DETECTED   Stenotrophomonas maltophilia NOT DETECTED NOT DETECTED   Candida albicans NOT DETECTED NOT DETECTED   Candida auris NOT DETECTED NOT DETECTED   Candida glabrata DETECTED (A) NOT DETECTED   Candida krusei NOT DETECTED NOT DETECTED   Candida parapsilosis NOT DETECTED NOT DETECTED   Candida tropicalis NOT DETECTED NOT DETECTED   Cryptococcus neoformans/gattii NOT DETECTED NOT DETECTED   Enos Fling, PharmD PGY-1 Acute Care Pharmacy Resident 02/22/2023 2:33 PM

## 2023-02-22 NOTE — Consult Note (Signed)
[facility]                                                                                        Patient Identification: Patient Name: [name] MRN: [id number] Admit Date: 02/19/2023  1:45 PM Today's Date: 02/22/2023 Reason for consult: candidemia  Requesting provider: Riley Lam consult  Principal Problem:   Small bowel obstruction (HCC) Active Problems:   Crohn's disease (HCC)   HTN (hypertension)   Upper GI bleed   Stroke (cerebrum) (HCC)   AKI (acute kidney injury) (HCC)   Sepsis secondary to UTI [facility])   Urinary retention   Abnormal ECG   Demand ischemia   Aortic dissection (HCC)   Antibiotics:  Zosyn 9/5  Lines/Hardware: left THA  Assessment # Fungemia ( C glabrata): likely intraabdominal source, r/o endocarditis  # SBO - conservative management, general surgery following # Possible GIB , getting PRBCs transfused # Urine cultures + for E coli - no GU symptoms, covered by zosyn   Recommendations  Will start micafungin Repeat 2 sets of blood cultures  TTE, may need TEE Ophthalmology eval to r/o endopthalmitis given fungemia No clear intraabdominal infcetion, can complete 5 days course,   Rest of the management as per the primary team. Please call with questions or concerns.  Thank you for the consult  __________________________________________________________________________________________________________ HPI and Hospital Course: 77 year old female with PMH of, HTN, recent CVA with left hemiparesis, GIB, Chron's disease who presented to the ED on 9/5 from [facility] for nausea and vomiting, abdominal pain/distension  for 3 days as well as headache and back pain  At ED afebrile, tachycardic tachypneic SpO2 93% on room air, placed on [facility] remarkable for WBC 29.2, K3.4 AKI with creatinine 1.38, albumin 3.4, UA with large leukocytes, negative nitrite 9/5 urine  cx with E coli, no blood cx  Was given IV fentanyl, IVF, IV Zosyn for concern of infection Was inserted given 2200 cc of urine in the bladder Patient was initially to be admitted for SBO but EKG performed with concern for STEMI versus pericarditis and hence transferred to [facility] to be seen by cardiology Evaluated by cardiology., not a candidate for anticoagulation. Evaluated by general surgery and plan for conservative management for SBO  ROS: limited complains of dry nose, neck and back, body pain   Past Medical History:  Diagnosis Date   Anemia, unspecified    Arthritis    Crohn's disease (HCC)    Degenerative disc disease, lumbar    Hypertension    Sciatica of right side    Stroke [facility])    Ventral hernia    Past Surgical History:  Procedure Laterality Date   CAROTID ENDARTERECTOMY     2004   CHOLECYSTECTOMY     2008   ESOPHAGOGASTRODUODENOSCOPY N/A 02/24/2015   Procedure: ESOPHAGOGASTRODUODENOSCOPY (EGD);  Surgeon: Corbin Ade, MD;  Location: [facility] ENDO SUITE;  Service: Endoscopy;  Laterality: N/A;   HEMICOLECTOMY     right   HIP ARTHROPLASTY Left 04/09/2015   Procedure: INSERT PARTIAL HIP REPLACEMENT LEFT;  Surgeon: Vickki Hearing, MD;  Location: [facility] ORS;  Service: Orthopedics;  Laterality: Left;   INCISIONAL HERNIA REPAIR N/A 05/20/2013   Procedure: Sherald Hess HERNIORRHAPHY WITH MESH;  Surgeon: Dalia Heading, MD;  Location: [facility] ORS;  Service: General;  Laterality: N/A;   INCISIONAL HERNIA REPAIR N/A 05/19/2014   Procedure: RECURRENT HERNIA REPAIR INCISIONAL;  Surgeon: Dalia Heading, MD;  Location: [facility] ORS;  Service: General;  Laterality: N/A;   INSERTION OF MESH N/A 05/20/2013   Procedure: INSERTION OF MESH;  Surgeon: Dalia Heading, MD;  Location: [facility] ORS;  Service: General;  Laterality: N/A;   INSERTION OF MESH N/A 05/19/2014   Procedure: INSERTION OF MESH;  Surgeon: Dalia Heading, MD;  Location: [facility] ORS;  Service: General;  Laterality: N/A;   TUBAL LIGATION        Scheduled Meds:  Chlorhexidine Gluconate Cloth  6 each Topical Daily   pantoprazole (PROTONIX) IV  40 mg Intravenous Q12H   Continuous Infusions:  sodium chloride Stopped (02/20/23 1319)   dextrose 5 % and 0.9 % NaCl with KCl 20 mEq/L 75 mL/hr at 02/21/23 0813   piperacillin-tazobactam (ZOSYN)  IV 3.375 g (02/22/23 0515)   PRN Meds:.sodium chloride, acetaminophen **OR** acetaminophen, magic mouthwash, morphine injection, ondansetron **OR** ondansetron (ZOFRAN) IV  Allergies  Allergen Reactions   Flagyl [Metronidazole Hcl] Other (See Comments)    "makes me feel whoozy and weird"   Other     Unknown antibiotic and unknown pain medications    Social History   Socioeconomic History   Marital status: Widowed    Spouse name: Not on file   Number of children: Not on file   Years of education: Not on file   Highest education level: Not on file  Occupational History   Not on file  Tobacco Use   Smoking status: Every Day    Current packs/day: 1.50    Average packs/day: 1.5 packs/day for 30.0 years (45.0 ttl pk-yrs)    Types: Cigarettes   Smokeless tobacco: Never   Tobacco comments:    1-2 pack day x 20 yrs.  Vaping Use   Vaping status: Never Used  Substance and Sexual Activity   Alcohol use: No    Alcohol/week: 1.0 standard drink of alcohol    Types: 1 Standard drinks or equivalent per week    Comment: one every 2 -3 months.   Drug use: No   Sexual activity: Not on file  Other Topics Concern   Not on file  Social History Narrative   Not on file   Social Determinants of Health   Financial Resource Strain: Not on file  Food Insecurity: No Food Insecurity (02/20/2023)   Hunger Vital Sign    Worried About Running Out of Food in the Last Year: Never true    Ran Out of Food in the Last Year: Never true  Transportation Needs: No Transportation Needs (02/20/2023)   PRAPARE - Administrator, Civil Service (Medical): No    Lack of Transportation (Non-Medical):  No  Physical Activity: Not on file  Stress: Not on file  Social Connections: Not on file  Intimate Partner Violence: Not At Risk (02/20/2023)   Humiliation, Afraid, Rape, and Kick questionnaire    Fear of Current or Ex-Partner: No    Emotionally Abused: No    Physically Abused: No    Sexually Abused: No   Family History  Problem Relation Age of Onset   GI problems Father    Stroke Brother    Heart attack Mother    Hypotension Sister  Vitals BP 137/71 (BP Location: Left Arm)   Pulse 88   Temp 98 F (36.7 C) (Axillary)   Resp 18   Ht 5\' 5"  (1.651 m)   Wt 52.1 kg   SpO2 94%   BMI 19.10 kg/m    Physical Exam Constitutional:  elderly female lying in the  bed and head turned to rt side, prefers to turn rt side per RN     Comments: dry oral mucosa. No thrush   Cardiovascular:     Rate and Rhythm: Normal rate and regular rhythm.     Heart sounds: s1s2  Pulmonary:     Effort: Pulmonary effort is normal.     Comments: Normal lung sounds   Abdominal:     Palpations: Abdomen is soft.     Tenderness: non distended and non tender   Musculoskeletal:        General: No swelling or tenderness in peripheral joints   Skin:    Comments: no rashe, no cvc  Neurological:     General: awake, alert and oriented, following commands   Pertinent Microbiology Results for orders placed or performed during the hospital encounter of 02/19/23  SARS Coronavirus 2 by RT PCR (hospital order, performed in [facility] hospital lab) *cepheid single result test* Anterior Nasal Swab     Status: None   Collection Time: 02/19/23  2:13 PM   Specimen: Anterior Nasal Swab  Result Value Ref Range Status   SARS Coronavirus 2 by RT PCR NEGATIVE NEGATIVE Final    Comment: (NOTE) SARS-CoV-2 target nucleic acids are NOT DETECTED.  The SARS-CoV-2 RNA is generally detectable in upper and lower respiratory specimens during the acute phase of infection. The lowest concentration of SARS-CoV-2 viral  copies this assay can detect is 250 copies / mL. A negative result does not preclude SARS-CoV-2 infection and should not be used as the sole basis for treatment or other patient management decisions.  A negative result may occur with improper specimen collection / handling, submission of specimen other than nasopharyngeal swab, presence of viral mutation(s) within the areas targeted by this assay, and inadequate number of viral copies (<250 copies / mL). A negative result must be combined with clinical observations, patient history, and epidemiological information.  Fact Sheet for Patients:   RoadLapTop.co.za  Fact Sheet for Healthcare Providers: http://kim-miller.com/  This test is not yet approved or  cleared by the [place] FDA and has been authorized for detection and/or diagnosis of SARS-CoV-2 by FDA under an Emergency Use Authorization (EUA).  This EUA will remain in effect (meaning this test can be used) for the duration of the COVID-19 declaration under Section 564(b)(1) of the Act, 21 U.S.C. section 360bbb-3(b)(1), unless the authorization is terminated or revoked sooner.  Performed at [facility], [address], [place], [place] [address]   Culture, blood (Routine X 2) w Reflex to ID Panel     Status: None (Preliminary result)   Collection Time: 02/19/23  9:30 PM   Specimen: BLOOD  Result Value Ref Range Status   Specimen Description BLOOD BLOOD RIGHT HAND  Final   Special Requests   Final    BOTTLES DRAWN AEROBIC ONLY Blood Culture results may not be optimal due to an inadequate volume of blood received in culture bottles   Culture   Final    NO GROWTH 3 DAYS Performed at [facility], [address], [place], [place] [address]    Report Status PENDING  Incomplete  Urine Culture (for  pregnant, neutropenic or urologic patients or patients with an indwelling urinary catheter)     Status: Abnormal   Collection Time:  02/19/23  9:30 PM   Specimen: Urine, Catheterized  Result Value Ref Range Status   Specimen Description   Final    URINE, CATHETERIZED Performed at [facility], [address] [place], [place] [address]    Special Requests   Final    NONE Performed at [facility], [address], [place], [place] [address]    Culture >=100,000 COLONIES/mL ESCHERICHIA COLI (A)  Final   Report Status 02/22/2023 FINAL  Final   Organism ID, Bacteria ESCHERICHIA COLI (A)  Final      Susceptibility   Escherichia coli - MIC*    AMPICILLIN >=32 RESISTANT Resistant     CEFAZOLIN <=4 SENSITIVE Sensitive     CEFEPIME <=0.12 SENSITIVE Sensitive     CEFTRIAXONE <=0.25 SENSITIVE Sensitive     CIPROFLOXACIN >=4 RESISTANT Resistant     GENTAMICIN <=1 SENSITIVE Sensitive     IMIPENEM <=0.25 SENSITIVE Sensitive     NITROFURANTOIN <=16 SENSITIVE Sensitive     TRIMETH/SULFA <=20 SENSITIVE Sensitive     AMPICILLIN/SULBACTAM 8 SENSITIVE Sensitive     PIP/TAZO <=4 SENSITIVE Sensitive     * >=100,000 COLONIES/mL ESCHERICHIA COLI  Culture, blood (Routine X 2) w Reflex to ID Panel     Status: None (Preliminary result)   Collection Time: 02/19/23 11:19 PM   Specimen: BLOOD  Result Value Ref Range Status   Specimen Description BLOOD LEFT ANTECUBITAL  Final   Special Requests   Final    BOTTLES DRAWN AEROBIC AND ANAEROBIC Blood Culture results may not be optimal due to an excessive volume of blood received in culture bottles   Culture  Setup Time   Final    YEAST AEROBIC BOTTLE ONLY Gram Stain Report Called to,Read Back By and Verified With: Jerene Canny AT [facility] AT 0925 ON [id number] BY S DALTON Performed at [facility], [address] [place], [place] [address]    Culture YEAST  Final   Report Status PENDING  Incomplete   Pertinent Lab seen by me:    Latest Ref Rng & Units 02/22/2023    4:46 AM 02/21/2023    4:14 AM 02/20/2023    6:42 AM  CBC  WBC 4.0 - 10.5 K/uL 9.9  11.2  20.2   Hemoglobin 12.0 - 15.0 g/dL 72.5  6.8   7.6   Hematocrit 36.0 - 46.0 % 37.5  22.6  23.9   Platelets 150 - 400 K/uL 169  214  220       Latest Ref Rng & Units 02/22/2023    4:46 AM 02/21/2023    4:14 AM 02/20/2023    6:42 AM  CMP  Glucose 70 - 99 mg/dL 366  440  347   BUN 8 - 23 mg/dL 13  20  36   Creatinine 0.44 - 1.00 mg/dL 4.25  9.56  3.87   Sodium 135 - 145 mmol/L 142  143  141   Potassium 3.5 - 5.1 mmol/L 3.7  2.9  2.6   Chloride 98 - 111 mmol/L 111  112  106   CO2 22 - 32 mmol/L 24  24  24    Calcium 8.9 - 10.3 mg/dL 8.1  8.0  8.5   Total Protein 6.5 - 8.1 g/dL 4.5     Total Bilirubin 0.3 - 1.2 mg/dL 0.4     Alkaline Phos 38 - 126 U/L  31     AST 15 - 41 U/L 25     ALT 0 - 44 U/L 17        Pertinent Imagings/Other Imagings Plain films and CT images have been personally visualized and interpreted; radiology reports have been reviewed. Decision making incorporated into the Impression / Recommendations.  DG Abd Portable 1V-Small Bowel Obstruction Protocol-initial, 8 hr delay  Result Date: 02/20/2023 CLINICAL DATA:  SBO 8 hour delay EXAM: PORTABLE ABDOMEN - 1 VIEW COMPARISON:  02/20/2023, CT 02/19/2023 FINDINGS: Esophageal tube tip at the distal esophagus. Mild central small bowel distension. Enteral contrast appears within the colon. Partially visualized left hip replacement IMPRESSION: 1. Esophageal tube tip at the distal esophageal region 2. Enteral contrast visible in the colon. Mild residual small bowel distension but probably decreased compared to prior radiograph Electronically Signed   By: Jasmine Pang M.D.   On: 02/20/2023 21:24   ECHOCARDIOGRAM COMPLETE  Result Date: 02/20/2023    ECHOCARDIOGRAM REPORT   Patient Name:   [name] Date of Exam: 02/20/2023 Medical Rec #:  [id number]    Height:       65.0 in Accession #:    [id number]   Weight:       120.4 lb Date of Birth:  06-06-1946    BSA:          1.594 m Patient Age:    35 years     BP:           131/65 mmHg Patient Gender: F            HR:           97 bpm. Exam  Location:  Inpatient Procedure: 2D Echo, Color Doppler and Cardiac Doppler Indications:    Abnormal ECG  History:        Patient has prior history of Echocardiogram examinations, most                 recent 02/08/2023. Stroke; Risk Factors:Hypertension and Current                 Smoker.  Sonographer:    Milda Smart Referring Phys: [phone] DONALD WICKLINE  Sonographer Comments: Image acquisition challenging due to patient body habitus and Image acquisition challenging due to respiratory motion. IMPRESSIONS  1. Left ventricular ejection fraction, by estimation, is 65 to 70%. The left ventricle has normal function. The left ventricle has no regional wall motion abnormalities. Left ventricular diastolic parameters are consistent with Grade I diastolic dysfunction (impaired relaxation).  2. Right ventricular systolic function is normal. The right ventricular size is mildly enlarged. There is normal pulmonary artery systolic pressure. The estimated right ventricular systolic pressure is 19.6 mmHg.  3. The mitral valve is grossly normal. Trivial mitral valve regurgitation. No evidence of mitral stenosis.  4. The aortic valve is tricuspid. There is mild calcification of the aortic valve. There is mild thickening of the aortic valve. Aortic valve regurgitation is not visualized. Aortic valve sclerosis/calcification is present, without any evidence of aortic stenosis. Aortic valve area, by VTI measures 1.74 cm.  5. The inferior vena cava is normal in size with greater than 50% respiratory variability, suggesting right atrial pressure of 3 mmHg. Comparison(s): No significant change from prior study. FINDINGS  Left Ventricle: Left ventricular ejection fraction, by estimation, is 65 to 70%. The left ventricle has normal function. The left ventricle has no regional wall motion abnormalities. The left ventricular internal cavity size was normal in size. There  is  no left ventricular hypertrophy. Left ventricular diastolic  parameters are consistent with Grade I diastolic dysfunction (impaired relaxation). Right Ventricle: The right ventricular size is mildly enlarged. No increase in right ventricular wall thickness. Right ventricular systolic function is normal. There is normal pulmonary artery systolic pressure. The tricuspid regurgitant velocity is 2.04  m/s, and with an assumed right atrial pressure of 3 mmHg, the estimated right ventricular systolic pressure is 19.6 mmHg. Left Atrium: Left atrial size was normal in size. Right Atrium: Right atrial size was normal in size. Pericardium: There is no evidence of pericardial effusion. Presence of epicardial fat layer. Mitral Valve: The mitral valve is grossly normal. Trivial mitral valve regurgitation. No evidence of mitral valve stenosis. MV peak gradient, 7.2 mmHg. The mean mitral valve gradient is 3.0 mmHg. Tricuspid Valve: The tricuspid valve is grossly normal. Tricuspid valve regurgitation is trivial. No evidence of tricuspid stenosis. The flow in the hepatic veins is normal during ventricular systole. Aortic Valve: The aortic valve is tricuspid. There is mild calcification of the aortic valve. There is mild thickening of the aortic valve. Aortic valve regurgitation is not visualized. Aortic valve sclerosis/calcification is present, without any evidence of aortic stenosis. Aortic valve mean gradient measures 9.0 mmHg. Aortic valve peak gradient measures 16.2 mmHg. Aortic valve area, by VTI measures 1.74 cm. Pulmonic Valve: The pulmonic valve was grossly normal. Pulmonic valve regurgitation is not visualized. No evidence of pulmonic stenosis. Aorta: The aortic root, ascending aorta and aortic arch are all structurally normal, with no evidence of dilitation or obstruction and the aortic root is normal in size and structure. Venous: The inferior vena cava is normal in size with greater than 50% respiratory variability, suggesting right atrial pressure of 3 mmHg. IAS/Shunts: The  atrial septum is grossly normal.  LEFT VENTRICLE PLAX 2D LVIDd:         3.40 cm   Diastology LVIDs:         2.20 cm   LV e' medial:    4.57 cm/s LV PW:         0.80 cm   LV E/e' medial:  16.9 LV IVS:        0.80 cm   LV e' lateral:   4.62 cm/s LVOT diam:     1.70 cm   LV E/e' lateral: 16.7 LV SV:         58 LV SV Index:   36 LVOT Area:     2.27 cm  RIGHT VENTRICLE RV Basal diam:  3.40 cm RV S prime:     12.40 cm/s TAPSE (M-mode): 2.0 cm LEFT ATRIUM             Index       RIGHT ATRIUM          Index LA diam:        2.20 cm 1.38 cm/m  RA Area:     7.90 cm LA Vol (A2C):   14.7 ml 9.22 ml/m  RA Volume:   14.90 ml 9.35 ml/m LA Vol (A4C):   14.4 ml 9.00 ml/m LA Biplane Vol: 15.5 ml 9.72 ml/m  AORTIC VALVE AV Area (Vmax):    1.71 cm AV Area (Vmean):   1.67 cm AV Area (VTI):     1.74 cm AV Vmax:           201.00 cm/s AV Vmean:          148.000 cm/s AV VTI:  0.334 m AV Peak Grad:      16.2 mmHg AV Mean Grad:      9.0 mmHg LVOT Vmax:         151.00 cm/s LVOT Vmean:        109.000 cm/s LVOT VTI:          0.256 m LVOT/AV VTI ratio: 0.77  AORTA Ao Root diam: 2.80 cm MITRAL VALVE                TRICUSPID VALVE MV Area (PHT): 2.85 cm     TR Peak grad:   16.6 mmHg MV Area VTI:   2.55 cm     TR Vmax:        204.00 cm/s MV Peak grad:  7.2 mmHg MV Mean grad:  3.0 mmHg     SHUNTS MV Vmax:       1.34 m/s     Systemic VTI:  0.26 m MV Vmean:      87.4 cm/s    Systemic Diam: 1.70 cm MV Decel Time: 266 msec MV E velocity: 77.40 cm/s MV A velocity: 105.00 cm/s MV E/A ratio:  0.74 Lennie Odor MD Electronically signed by Lennie Odor MD Signature Date/Time: 02/20/2023/7:36:29 AM    Final    DG Abdomen 1 View  Result Date: 02/20/2023 CLINICAL DATA:  ng tube placement EXAM: ABDOMEN - 1 VIEW COMPARISON:  CT abdomen pelvis 02/19/2023 FINDINGS: Enteric tube courses below the hemidiaphragm with tip overlying the expected region of the gastroesophageal junction/proximal gastric lumen and side port overlying the expected  region of the distal esophagus. Dilatation of several loops of small bowel. No radio-opaque calculi or other significant radiographic abnormality are seen. IMPRESSION: 1. Enteric tube courses below the hemidiaphragm with tip overlying the expected region of the gastroesophageal junction/proximal gastric lumen and side port overlying the expected region of the distal esophagus. Recommend advancing by 8 cm. 2. Small-bowel obstruction. Electronically Signed   By: Tish Frederickson M.D.   On: 02/20/2023 03:03   DG Chest Portable 1 View  Result Date: 02/20/2023 CLINICAL DATA:  Pain, vomiting EXAM: PORTABLE CHEST 1 VIEW COMPARISON:  02/19/2023 FINDINGS: The heart size and mediastinal contours are within normal limits. Both lungs are clear. The visualized skeletal structures are unremarkable. IMPRESSION: Normal study. Electronically Signed   By: Charlett Nose M.D.   On: 02/20/2023 02:05   DG Chest Portable 1 View  Result Date: 02/19/2023 CLINICAL DATA:  NG tube advancement. EXAM: PORTABLE CHEST 1 VIEW COMPARISON:  Radiographs 02/19/2023 at 9:39 p.m. FINDINGS: Similar position of the NG tube tip near the gastroesophageal junction and side-port in the distal esophagus. Recommend advancement approximately 10 cm to ensure side port is within the stomach. Remainder unchanged. IMPRESSION: Similar position of the NG tube in the distal esophagus. Recommend advancement approximately 10 cm to ensure side port is within the stomach. Electronically Signed   By: Minerva Fester M.D.   On: 02/19/2023 23:44   DG Chest Port 1 View  Result Date: 02/19/2023 CLINICAL DATA:  NG tube placement EXAM: PORTABLE CHEST 1 VIEW COMPARISON:  02/12/2023 FINDINGS: NG tube tip is in the distal esophagus. Heart mediastinal contours within normal limits. Visualized lungs clear. No effusions. Lung apices not visualized. IMPRESSION: NG tube tip in the distal esophagus. This could be advanced several cm into the stomach. Electronically Signed   By:  Charlett Nose M.D.   On: 02/19/2023 22:14   CT ABDOMEN PELVIS W CONTRAST  Result Date: 02/19/2023 CLINICAL DATA:  Nausea and vomiting for 3 days.  Abdominal pain. EXAM: CT ABDOMEN AND PELVIS WITH CONTRAST TECHNIQUE: Multidetector CT imaging of the abdomen and pelvis was performed using the standard protocol following bolus administration of intravenous contrast. RADIATION DOSE REDUCTION: This exam was performed according to the departmental dose-optimization program which includes automated exposure control, adjustment of the mA and/or kV according to patient size and/or use of iterative reconstruction technique. CONTRAST:  75mL OMNIPAQUE IOHEXOL 300 MG/ML  SOLN COMPARISON:  07/07/2015 FINDINGS: Lower chest: No acute abnormality. Hepatobiliary: Respiratory motion mildly degrades detail in the upper abdomen. Unremarkable liver. Cholecystectomy. Prominent bile duct likely due to reservoir effect. Pancreas: Coarse calcifications in the pancreatic head due to sequela of chronic pancreatitis. No acute pancreatitis. No ductal dilation. Spleen: Unremarkable. Adrenals/Urinary Tract: Stable adrenal glands. No urinary calculi or hydronephrosis. Nondistended bladder about a Foley catheter. Stomach/Bowel: Stomach is within normal limits. Postoperative changes with neoterminal ileum. Marked dilation of the small bowel with air-fluid levels and transition point in the low posterior abdomen (series 2/image 50). Decompressed colon. Colonic diverticulosis without diverticulitis. Duodenal diverticulum. Vascular/Lymphatic: Aortic atherosclerosis. No enlarged abdominal or pelvic lymph nodes. Reproductive: Uterus and bilateral adnexa are unremarkable. Other: No free intraperitoneal fluid or air. 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 Musculoskeletal: Left THA.  No acute fracture. IMPRESSION: 1. Small-bowel obstruction with transition point in the low posterior abdomen. Aortic Atherosclerosis (ICD10-I70.0). Electronically  Signed   By: Minerva Fester M.D.   On: 02/19/2023 18:04   CT Head Wo Contrast  Result Date: 02/19/2023 CLINICAL DATA:  Headache, sudden, severe.  Nausea and vomiting. EXAM: CT HEAD WITHOUT CONTRAST TECHNIQUE: Contiguous axial images were obtained from the base of the skull through the vertex without intravenous contrast. RADIATION DOSE REDUCTION: This exam was performed according to the departmental dose-optimization program which includes automated exposure control, adjustment of the mA and/or kV according to patient size and/or use of iterative reconstruction technique. COMPARISON:  CTA head and neck 02/09/2023.  MRI head 02/08/2023. FINDINGS: Brain: Hypodensity associated with the posterior right MCA territory infarct has decreased compared to the prior CT with cortex currently only being subtly hypodense likely reflecting fogging phenomenon. No new infarct, acute hemorrhage, mass, or midline shift is evident. Small chronic left parieto-occipital cortical infarcts are again noted, and chronic small vessel ischemia is again noted involving the cerebral white matter bilaterally. Prominent extra-axial CSF over both cerebral convexities is unchanged and attributed to atrophy. Prominent extra-axial CSF in the posterior fossa overlying the left greater than right cerebellar hemispheres is also unchanged. Vascular: Calcified atherosclerosis at the skull base. No hyperdense vessel. Skull: No acute fracture or suspicious osseous lesion. Sinuses/Orbits: Paranasal sinuses and mastoid air cells are clear. Unremarkable orbits. Other: None. IMPRESSION: 1. No evidence of acute intracranial abnormality. 2. Interval evolution of a subacute posterior right MCA territory infarct. 3. Chronic small vessel ischemia. Electronically Signed   By: Sebastian Ache M.D.   On: 02/19/2023 18:02   DG Chest 2 View  Result Date: 02/12/2023 CLINICAL DATA:  Pneumonia EXAM: CHEST - 2 VIEW COMPARISON:  01/24/2023 FINDINGS: Heart and mediastinal  contours are within normal limits. No focal opacities or effusions. No acute bony abnormality. IMPRESSION: No active cardiopulmonary disease. Electronically Signed   By: Charlett Nose M.D.   On: 02/12/2023 10:21   CT ANGIO HEAD NECK W WO CM  Result Date: 02/09/2023 CLINICAL DATA:  Neuro deficit, acute, stroke suspected; Neck pain, acute, no red flags EXAM: CT CERVICAL SPINE WITHOUT CONTRAST CT ANGIOGRAPHY  HEAD AND NECK WITH AND WITHOUT CONTRAST TECHNIQUE: Multidetector CT imaging of the cervical spine was performed without intravenous contrast. Multiplanar CT image reconstructions were also generated. Multidetector CT imaging of the head and neck was performed using the standard protocol during bolus administration of intravenous contrast. Multiplanar CT image reconstructions and MIPs were obtained to evaluate the vascular anatomy. Carotid stenosis measurements (when applicable) are obtained utilizing NASCET criteria, using the distal internal carotid diameter as the denominator. RADIATION DOSE REDUCTION: This exam was performed according to the departmental dose-optimization program which includes automated exposure control, adjustment of the mA and/or kV according to patient size and/or use of iterative reconstruction technique. CONTRAST:  75mL OMNIPAQUE IOHEXOL 350 MG/ML SOLN COMPARISON:  None Available. FINDINGS: CT HEAD FINDINGS See same day brain MRI for intracranial findings. No contrast-enhancing lesion is visualized. There is decreased arborization in the distal right MCA territory. CT CERVICAL SPINE FINDINGS: Alignment: Straightening of the normal cervical lordosis. Trace retrolisthesis of C5 on C6. Trace anterolisthesis of C7 on T1. Skull base and vertebrae: No acute fracture. No primary bone lesion or focal pathologic process. Soft tissues and spinal canal: No prevertebral fluid or swelling. No visible canal hematoma. Disc levels:  No evidence of high-grade spinal canal stenosis Upper chest: Mild  centrilobular emphysema Other: None. CTA NECK FINDINGS Aortic arch: Standard branching. Imaged portion shows no evidence of aneurysm or dissection. No significant stenosis of the major arch vessel origins. Mild narrowing of the origin of the right subclavian artery. Mild narrowing of the mid segment of the left subclavian artery. Right carotid system: There is severe, nearly occlusive stenosis in the proximal right ICA. The lumen of the level of stenosis is difficult to visualize and likely measures less than 1 mm of the site of greatest stenosis (series 19, image 33). Left carotid system: There is likely a carotid web in the proximal left ICA (series 18, image 31). Likely postsurgical changes from a prior left-sided carotid endarterectomy. Vertebral arteries: Left dominant. The right vertebral artery terminates in a PICA. No evidence of dissection, stenosis (50% or greater), or occlusion. Skeleton: See above Other neck: Negative. Upper chest: See above Review of the MIP images confirms the above findings CTA HEAD FINDINGS Anterior circulation: There is severe focal stenosis in an M3/M4 segment of the right MCA (series 507, image 102). No proximal occlusion. There is a small 2 mm inferiorly projecting outpouching of the ICA terminus on the left (series 508, image 120), which could represent a small aneurysm. Posterior circulation: No significant stenosis, proximal occlusion, aneurysm, or vascular malformation. Venous sinuses: As permitted by contrast timing, patent. Anatomic variants: Fetal PCA on the right. Review of the MIP images confirms the above findings IMPRESSION: 1. Severe, nearly occlusive stenosis in the proximal right ICA. The lumen of the level of stenosis is difficult to visualize and likely measures less than 1 mm of the site of greatest stenosis. 2. Severe focal stenosis in an M3/M4 segment of the right MCA. 3. No acute fracture or traumatic listhesis in the cervical spine. 4. Small 2 mm inferiorly  projecting outpouching of the ICA terminus on the left, which could represent a small aneurysm. 5. See same day brain MRI for intracranial findings. 6. Emphysema. Emphysema (ICD10-J43.9). Electronically Signed   By: Lorenza Cambridge M.D.   On: 02/09/2023 18:34   CT C-SPINE NO CHARGE  Result Date: 02/09/2023 CLINICAL DATA:  Neuro deficit, acute, stroke suspected; Neck pain, acute, no red flags EXAM: CT CERVICAL SPINE WITHOUT CONTRAST  CT ANGIOGRAPHY HEAD AND NECK WITH AND WITHOUT CONTRAST TECHNIQUE: Multidetector CT imaging of the cervical spine was performed without intravenous contrast. Multiplanar CT image reconstructions were also generated. Multidetector CT imaging of the head and neck was performed using the standard protocol during bolus administration of intravenous contrast. Multiplanar CT image reconstructions and MIPs were obtained to evaluate the vascular anatomy. Carotid stenosis measurements (when applicable) are obtained utilizing NASCET criteria, using the distal internal carotid diameter as the denominator. RADIATION DOSE REDUCTION: This exam was performed according to the departmental dose-optimization program which includes automated exposure control, adjustment of the mA and/or kV according to patient size and/or use of iterative reconstruction technique. CONTRAST:  75mL OMNIPAQUE IOHEXOL 350 MG/ML SOLN COMPARISON:  None Available. FINDINGS: CT HEAD FINDINGS See same day brain MRI for intracranial findings. No contrast-enhancing lesion is visualized. There is decreased arborization in the distal right MCA territory. CT CERVICAL SPINE FINDINGS: Alignment: Straightening of the normal cervical lordosis. Trace retrolisthesis of C5 on C6. Trace anterolisthesis of C7 on T1. Skull base and vertebrae: No acute fracture. No primary bone lesion or focal pathologic process. Soft tissues and spinal canal: No prevertebral fluid or swelling. No visible canal hematoma. Disc levels:  No evidence of high-grade  spinal canal stenosis Upper chest: Mild centrilobular emphysema Other: None. CTA NECK FINDINGS Aortic arch: Standard branching. Imaged portion shows no evidence of aneurysm or dissection. No significant stenosis of the major arch vessel origins. Mild narrowing of the origin of the right subclavian artery. Mild narrowing of the mid segment of the left subclavian artery. Right carotid system: There is severe, nearly occlusive stenosis in the proximal right ICA. The lumen of the level of stenosis is difficult to visualize and likely measures less than 1 mm of the site of greatest stenosis (series 19, image 33). Left carotid system: There is likely a carotid web in the proximal left ICA (series 18, image 31). Likely postsurgical changes from a prior left-sided carotid endarterectomy. Vertebral arteries: Left dominant. The right vertebral artery terminates in a PICA. No evidence of dissection, stenosis (50% or greater), or occlusion. Skeleton: See above Other neck: Negative. Upper chest: See above Review of the MIP images confirms the above findings CTA HEAD FINDINGS Anterior circulation: There is severe focal stenosis in an M3/M4 segment of the right MCA (series 507, image 102). No proximal occlusion. There is a small 2 mm inferiorly projecting outpouching of the ICA terminus on the left (series 508, image 120), which could represent a small aneurysm. Posterior circulation: No significant stenosis, proximal occlusion, aneurysm, or vascular malformation. Venous sinuses: As permitted by contrast timing, patent. Anatomic variants: Fetal PCA on the right. Review of the MIP images confirms the above findings IMPRESSION: 1. Severe, nearly occlusive stenosis in the proximal right ICA. The lumen of the level of stenosis is difficult to visualize and likely measures less than 1 mm of the site of greatest stenosis. 2. Severe focal stenosis in an M3/M4 segment of the right MCA. 3. No acute fracture or traumatic listhesis in the  cervical spine. 4. Small 2 mm inferiorly projecting outpouching of the ICA terminus on the left, which could represent a small aneurysm. 5. See same day brain MRI for intracranial findings. 6. Emphysema. Emphysema (ICD10-J43.9). Electronically Signed   By: Lorenza Cambridge M.D.   On: 02/09/2023 18:34   MR BRAIN WO CONTRAST  Result Date: 02/08/2023 CLINICAL DATA:  Neuro deficit, acute, stroke suspected. EXAM: MRI HEAD WITHOUT CONTRAST TECHNIQUE: Multiplanar, multiecho pulse sequences  of the brain and surrounding structures were obtained without intravenous contrast. COMPARISON:  Head CT 02/07/2023 FINDINGS: Brain: There is a moderate-sized acute to early subacute posterior right MCA infarct involving portions of the parietal lobe, lateral occipital lobe, and posterior temporal and frontal lobes. A single focus of microhemorrhage is noted in the right parietal lobe and may be chronic. There is cytotoxic edema associated with the infarct with mild sulcal effacement but no midline shift or other significant mass effect. There are small chronic left parieto-occipital cortical infarcts. T2 hyperintensities in the cerebral white matter bilaterally are nonspecific but compatible with moderate chronic small vessel ischemic disease. Chronic lacunar infarcts are noted in the deep cerebral white matter bilaterally. T2 hyperintensities throughout the basal ganglia bilaterally predominantly reflect dilated perivascular spaces, possibly with some interspersed chronic lacunar infarcts. No mass or extra-axial fluid collection is evident. There is moderate cerebral atrophy. Vascular: Major intracranial vascular flow voids are preserved. Skull and upper cervical spine: Unremarkable bone marrow signal. Sinuses/Orbits: Remarkable orbits. Clear paranasal sinuses. Trace bilateral mastoid effusions. Other: None. IMPRESSION: 1. Moderate-sized acute to early subacute posterior right MCA infarct. 2. Moderate chronic small vessel ischemic  disease and cerebral atrophy. Electronically Signed   By: Sebastian Ache M.D.   On: 02/08/2023 18:09   ECHOCARDIOGRAM COMPLETE  Result Date: 02/08/2023    ECHOCARDIOGRAM REPORT   Patient Name:   [name] Date of Exam: 02/08/2023 Medical Rec #:  [id number]    Height:       65.0 in Accession #:    [id number]   Weight:       117.1 lb Date of Birth:  April 07, 1946    BSA:          1.575 m Patient Age:    74 years     BP:           148/82 mmHg Patient Gender: F            HR:           98 bpm. [facility] Procedure: 2D Echo, Cardiac Doppler, Color Doppler and Intracardiac            Opacification Agent Indications:    Stroke l63.9  History:        Patient has no prior history of Echocardiogram examinations.                 Risk Factors:Hypertension and Current Smoker.  Sonographer:    Celesta Gentile RCS Referring Phys: [id number] OLADAPO ADEFESO  Sonographer Comments: Technically difficult study due to poor echo windows. IMPRESSIONS  1. Left ventricular ejection fraction, by estimation, is 70 to 75%. The left ventricle has hyperdynamic function. The left ventricle has no regional wall motion abnormalities. There is mild asymmetric left ventricular hypertrophy of the basal-septal segment. Left ventricular diastolic parameters are consistent with Grade I diastolic dysfunction (impaired relaxation).  2. Right ventricular systolic function is normal. The right ventricular size is normal.  3. The mitral valve was not well visualized. No evidence of mitral valve regurgitation.  4. The aortic valve was not well visualized. There is mild calcification of the aortic valve. Aortic valve regurgitation is not visualized. Comparison(s): No prior Echocardiogram. FINDINGS  Left Ventricle: Left ventricular ejection fraction, by estimation, is 70 to 75%. The left ventricle has hyperdynamic function. The left ventricle has no regional wall motion abnormalities. Definity contrast agent was given IV to delineate the left  ventricular endocardial borders. The left ventricular internal cavity  size was normal in size. There is mild asymmetric left ventricular hypertrophy of the basal-septal segment. Left ventricular diastolic parameters are consistent with Grade I diastolic dysfunction (impaired relaxation). Right Ventricle: The right ventricular size is normal. No increase in right ventricular wall thickness. Right ventricular systolic function is normal. Left Atrium: Left atrial size was normal in size. Right Atrium: Right atrial size was normal in size. Pericardium: The pericardium was not well visualized. Mitral Valve: The mitral valve was not well visualized. No evidence of mitral valve regurgitation. Tricuspid Valve: The tricuspid valve is grossly normal. Tricuspid valve regurgitation is not demonstrated. No evidence of tricuspid stenosis. Aortic Valve: The aortic valve was not well visualized. There is mild calcification of the aortic valve. Aortic valve regurgitation is not visualized. Pulmonic Valve: The pulmonic valve was not well visualized. Pulmonic valve regurgitation is not visualized. No evidence of pulmonic stenosis. Aorta: The ascending aorta was not well visualized, the aortic arch was not well visualized and the aortic root was not well visualized. IAS/Shunts: The interatrial septum was not well visualized.  LEFT VENTRICLE PLAX 2D LVIDd:         2.60 cm   Diastology LVIDs:         2.00 cm   LV e' medial:    4.24 cm/s LV PW:         0.90 cm   LV E/e' medial:  11.8 LV IVS:        1.10 cm   LV e' lateral:   7.29 cm/s LVOT diam:     1.70 cm   LV E/e' lateral: 6.9 LV SV:         33 LV SV Index:   21 LVOT Area:     2.27 cm  RIGHT VENTRICLE RV S prime:     26.90 cm/s TAPSE (M-mode): 1.7 cm LEFT ATRIUM           Index        RIGHT ATRIUM          Index LA diam:      2.00 cm 1.27 cm/m   RA Area:     8.06 cm LA Vol (A2C): 17.4 ml 11.04 ml/m  RA Volume:   12.70 ml 8.06 ml/m LA Vol (A4C): 13.5 ml 8.57 ml/m  AORTIC VALVE  LVOT Vmax:   95.40 cm/s LVOT Vmean:  59.800 cm/s LVOT VTI:    0.146 m  AORTA Ao Root diam: 2.60 cm MITRAL VALVE                TRICUSPID VALVE MV Area (PHT): 1.27 cm     TR Peak grad:   28.7 mmHg MV Decel Time: 599 msec     TR Vmax:        268.00 cm/s MV E velocity: 50.10 cm/s MV A velocity: 106.00 cm/s  SHUNTS MV E/A ratio:  0.47         Systemic VTI:  0.15 m                             Systemic Diam: 1.70 cm Riley Lam MD Electronically signed by Riley Lam MD Signature Date/Time: 02/08/2023/5:04:37 PM    Final    US Carotid Bilateral  Result Date: 02/08/2023 CLINICAL DATA:  Stroke Fall from bed 2 days ago Hypertension Tobacco user Prior left endarterectomy EXAM: BILATERAL CAROTID DUPLEX ULTRASOUND TECHNIQUE: Wallace Cullens scale imaging, color Doppler and duplex ultrasound were performed of bilateral  carotid and vertebral arteries in the neck. COMPARISON:  None available FINDINGS: Criteria: Quantification of carotid stenosis is based on velocity parameters that correlate the residual internal carotid diameter with NASCET-based stenosis levels, using the diameter of the distal internal carotid lumen as the denominator for stenosis measurement. The following velocity measurements were obtained: RIGHT ICA: 205/57 cm/sec CCA: 46/16 cm/sec SYSTOLIC ICA/CCA RATIO:  4.5 ECA: 150 cm/sec LEFT ICA: 481/138 cm/sec CCA: 56/6 cm/sec SYSTOLIC ICA/CCA RATIO:  8.6 ECA: 45 cm/sec RIGHT CAROTID ARTERY: Extensive calcified plaque of the right internal carotid artery origin. RIGHT VERTEBRAL ARTERY:  Antegrade flow. LEFT CAROTID ARTERY: Mild scattered atheromatous plaque of the common carotid artery. Minimal mixed echogenicity plaque of the left carotid bulb. LEFT VERTEBRAL ARTERY:  Antegrade flow. IMPRESSION: 1. Extensive atheromatous plaque of the right ICA origin with velocity parameters consistent with 50-69% stenosis. 2. Post endarterectomy changes of the left internal carotid artery are seen. Minimal mixed  echogenicity plaque noted in the left carotid bulb. Significantly elevated velocity in the proximal left internal carotid artery suggestive of greater than 70% stenosis is discordant with the grayscale appearance of this segment. The exact degree of stenosis is difficult to determine given the discordant findings. 3. Given the discordant findings of the left internal carotid artery and the extensive atheromatous plaque of the right internal carotid artery, further evaluation with CT angiography of the neck should be performed for more precise quantification of stenosis. Electronically Signed   By: Acquanetta Belling M.D.   On: 02/08/2023 16:09   CT HEAD WO CONTRAST  Result Date: 02/07/2023 CLINICAL DATA:  Recent fall yesterday with increasing left-sided weakness, initial encounter EXAM: CT HEAD WITHOUT CONTRAST TECHNIQUE: Contiguous axial images were obtained from the base of the skull through the vertex without intravenous contrast. RADIATION DOSE REDUCTION: This exam was performed according to the departmental dose-optimization program which includes automated exposure control, adjustment of the mA and/or kV according to patient size and/or use of iterative reconstruction technique. COMPARISON:  01/24/2023 FINDINGS: Brain: No acute hemorrhage or space-occupying mass lesion is noted. Generalized atrophic changes are seen. There is a geographic area of decreased attenuation identified the right temporoparietal region posteriorly consistent within the evolving infarct likely of a subacute nature in the degree of decreased attenuation. Scattered chronic white matter ischemic changes are noted. 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 Vascular: No hyperdense vessel or unexpected calcification. Skull: Normal. Negative for fracture or focal lesion. Sinuses/Orbits: No acute finding. Other: None. IMPRESSION: Geographic area of decreased attenuation in the right posterior parietal lobe extending inferiorly into the  posterior temporal lobe consistent with subacute infarct. No focal abnormality is seen. Generalized atrophic changes are noted stable from the prior study. Electronically Signed   By: Alcide Clever M.D.   On: 02/07/2023 22:35   CT Head Wo Contrast  Result Date: 01/24/2023 CLINICAL DATA:  Transient ischemic attack. Unsteady. Weakness. Recent fall. EXAM: CT HEAD WITHOUT CONTRAST TECHNIQUE: Contiguous axial images were obtained from the base of the skull through the vertex without intravenous contrast. RADIATION DOSE REDUCTION: This exam was performed according to the departmental dose-optimization program which includes automated exposure control, adjustment of the mA and/or kV according to patient size and/or use of iterative reconstruction technique. COMPARISON:  04/09/2015 FINDINGS: Brain: No evidence of intracranial hemorrhage, acute infarction, hydrocephalus, extra-axial collection, or mass lesion/mass effect. Moderate to severe diffuse cerebral and cerebellar atrophy shows mild increase since prior study. Old left posterior parietal infarct again noted. Moderate chronic small vessel disease  again demonstrated. Vascular:  No hyperdense vessel or other acute findings. Skull: No evidence of fracture or other significant bone abnormality. Sinuses/Orbits:  No acute findings. Other: None. IMPRESSION: No acute intracranial abnormality. Diffuse cerebral atrophy, chronic small vessel disease, and old left posterior parietal infarct. Electronically Signed   By: Danae Orleans M.D.   On: 01/24/2023 16:27   DG Chest Portable 1 View  Result Date: 01/24/2023 CLINICAL DATA:  Weakness and fatigue. EXAM: PORTABLE CHEST 1 VIEW COMPARISON:  One-view chest x-ray 04/09/2015 FINDINGS: Heart size is normal. Atherosclerotic changes are present at the aortic arch. Changes of COPD are noted. No focal airspace disease is present. No edema or effusion is present. The visualized soft tissues and bony thorax are unremarkable.  IMPRESSION: 1. No acute cardiopulmonary disease. 2. COPD. Electronically Signed   By: Marin Roberts M.D.   On: 01/24/2023 15:13    I have personally spent 85 minutes involved in face-to-face and non-face-to-face activities for this patient on the day of the visit. Professional time spent includes the following activities: Preparing to see the patient (review of tests), Obtaining and/or reviewing separately obtained history (admission/discharge record), Performing a medically appropriate examination and/or evaluation , Ordering medications/tests/procedures, referring and communicating with other health care professionals, Documenting clinical information in the EMR, Independently interpreting results (not separately reported), Communicating results to the patient/family/caregiver, Counseling and educating the patient/family/caregiver and Care coordination (not separately reported).  Electronically signed by:   Plan d/w requesting provider as well as ID pharm D  Note: This document was prepared using dragon voice recognition software and may include unintentional dictation errors.   Odette Fraction, MD Infectious Disease Physician [facility] Pager: [phone]

## 2023-02-23 DIAGNOSIS — B49 Unspecified mycosis: Secondary | ICD-10-CM | POA: Diagnosis not present

## 2023-02-23 DIAGNOSIS — Z515 Encounter for palliative care: Secondary | ICD-10-CM | POA: Diagnosis not present

## 2023-02-23 DIAGNOSIS — K56609 Unspecified intestinal obstruction, unspecified as to partial versus complete obstruction: Secondary | ICD-10-CM | POA: Diagnosis not present

## 2023-02-23 DIAGNOSIS — Z7189 Other specified counseling: Secondary | ICD-10-CM | POA: Diagnosis not present

## 2023-02-23 LAB — CBC WITH DIFFERENTIAL/PLATELET
Abs Immature Granulocytes: 0.06 10*3/uL (ref 0.00–0.07)
Basophils Absolute: 0 10*3/uL (ref 0.0–0.1)
Basophils Relative: 0 %
Eosinophils Absolute: 0.5 10*3/uL (ref 0.0–0.5)
Eosinophils Relative: 5 %
HCT: 36.3 % (ref 36.0–46.0)
Hemoglobin: 12.2 g/dL (ref 12.0–15.0)
Immature Granulocytes: 1 %
Lymphocytes Relative: 12 %
Lymphs Abs: 1.2 10*3/uL (ref 0.7–4.0)
MCH: 31 pg (ref 26.0–34.0)
MCHC: 33.6 g/dL (ref 30.0–36.0)
MCV: 92.4 fL (ref 80.0–100.0)
Monocytes Absolute: 0.6 10*3/uL (ref 0.1–1.0)
Monocytes Relative: 6 %
Neutro Abs: 7.7 10*3/uL (ref 1.7–7.7)
Neutrophils Relative %: 76 %
Platelets: 155 10*3/uL (ref 150–400)
RBC: 3.93 MIL/uL (ref 3.87–5.11)
RDW: 18.1 % — ABNORMAL HIGH (ref 11.5–15.5)
WBC: 10.1 10*3/uL (ref 4.0–10.5)
nRBC: 0 % (ref 0.0–0.2)

## 2023-02-23 LAB — GLUCOSE, CAPILLARY
Glucose-Capillary: 110 mg/dL — ABNORMAL HIGH (ref 70–99)
Glucose-Capillary: 82 mg/dL (ref 70–99)
Glucose-Capillary: 88 mg/dL (ref 70–99)

## 2023-02-23 LAB — COMPREHENSIVE METABOLIC PANEL
ALT: 18 U/L (ref 0–44)
AST: 28 U/L (ref 15–41)
Albumin: 2.1 g/dL — ABNORMAL LOW (ref 3.5–5.0)
Alkaline Phosphatase: 38 U/L (ref 38–126)
Anion gap: 7 (ref 5–15)
BUN: 12 mg/dL (ref 8–23)
CO2: 24 mmol/L (ref 22–32)
Calcium: 7.8 mg/dL — ABNORMAL LOW (ref 8.9–10.3)
Chloride: 102 mmol/L (ref 98–111)
Creatinine, Ser: 0.85 mg/dL (ref 0.44–1.00)
GFR, Estimated: >60 mL/min (ref >60)
Glucose, Bld: 87 mg/dL (ref 70–99)
Potassium: 3.5 mmol/L (ref 3.5–5.1)
Sodium: 133 mmol/L — ABNORMAL LOW (ref 135–145)
Total Bilirubin: 0.9 mg/dL (ref 0.3–1.2)
Total Protein: 4.6 g/dL — ABNORMAL LOW (ref 6.5–8.1)

## 2023-02-23 LAB — PHOSPHORUS: Phosphorus: 2.1 mg/dL — ABNORMAL LOW (ref 2.5–4.6)

## 2023-02-23 LAB — MAGNESIUM: Magnesium: 1.9 mg/dL (ref 1.7–2.4)

## 2023-02-23 MED ORDER — POTASSIUM PHOSPHATES 15 MMOLE/5ML IV SOLN
15.0000 mmol | Freq: Once | INTRAVENOUS | Status: AC
  Administered 2023-02-23: 15 mmol via INTRAVENOUS
  Filled 2023-02-23: qty 5

## 2023-02-23 MED ORDER — POTASSIUM CHLORIDE CRYS ER 20 MEQ PO TBCR
40.0000 meq | EXTENDED_RELEASE_TABLET | Freq: Once | ORAL | Status: AC
  Administered 2023-02-23: 40 meq via ORAL
  Filled 2023-02-23: qty 2

## 2023-02-23 MED ORDER — ENSURE ENLIVE PO LIQD
237.0000 mL | Freq: Two times a day (BID) | ORAL | Status: DC
  Administered 2023-02-24: 237 mL via ORAL

## 2023-02-23 NOTE — [place] FL2 (Signed)
[place] MEDICAID [id number] LEVEL OF CARE FORM     IDENTIFICATION  Patient Name: [name] Birthdate: [birth year] Sex: female Admission Date (Current Location): 02/19/2023  [facility] and [place] Number:  Producer, television/film/video and Address:  [facility], [address], [place], [place] [address]      Provider Number: [id number]  Attending Physician Name and Address:  Noralee Stain, DO  Relative Name and Phone Number:  [name] (daughter) [phone]    Current Level of Care: Hospital Recommended Level of Care: Skilled Nursing Facility Prior Approval Number:    Date Approved/Denied:   PASRR Number: [id number]  Discharge Plan: SNF    Current Diagnoses: Patient Active Problem List   Diagnosis Date Noted   Abnormal ECG 02/20/2023   Demand ischemia 02/20/2023   Aortic dissection (HCC) 02/20/2023   Sepsis secondary to UTI (HCC) 02/19/2023   Urinary retention 02/19/2023   Hyponatremia 02/08/2023   AKI (acute kidney injury) (HCC) 02/08/2023   Leukocytosis 02/08/2023   Stroke (cerebrum) (HCC) 02/07/2023   Postoperative anemia due to acute blood loss    Subcapital fracture of hip (HCC) 04/09/2015   Hip fracture (HCC) 04/09/2015   Left displaced femoral neck fracture (HCC) 04/09/2015   Gastric ulceration    Upper GI bleed    UTI (lower urinary tract infection) 02/21/2015   Trichomonal infection 02/21/2015   Small bowel obstruction (HCC) 05/14/2013   Ventral hernia 06/03/2011   HTN (hypertension) 06/03/2011   Crohn's disease (HCC) 05/31/2011   Tobacco abuse 05/31/2011   Benign essential HTN 05/31/2011    Orientation RESPIRATION BLADDER Height & Weight     Self, Place  Normal Incontinent (Urethral Catheter) Weight: 114 lb 12.8 oz (52.1 kg) Height:  5\' 5"  (165.1 cm)  BEHAVIORAL SYMPTOMS/MOOD NEUROLOGICAL BOWEL NUTRITION STATUS      Continent Diet (Please see discharge summary)  AMBULATORY STATUS COMMUNICATION OF NEEDS Skin   Extensive Assist  Verbally Other (Comment) (Blister,hip,L,ecchymosis,arm,Bil.,Erythema,buttocks,Bil.,wound/Incision LDAs,wound,Wound/Incision open or dehisced,non-pressure wound vertebral column,upper,1cmx1cmx1cm,with 2 cm undermining,change daily)                       Personal Care Assistance Level of Assistance  Bathing, Feeding, Dressing Bathing Assistance: Maximum assistance Feeding assistance: Maximum assistance Dressing Assistance: Maximum assistance     Functional Limitations Info  Sight, Hearing, Speech   Hearing Info: Adequate Speech Info: Adequate    SPECIAL CARE FACTORS FREQUENCY  PT (By licensed PT), OT (By licensed OT)     PT Frequency: 5x min weekly OT Frequency: 5x min weekly            Contractures Contractures Info: Not present    Additional Factors Info  Code Status, Allergies Code Status Info: DNR-Limited Allergies Info: Flagyl (metronidazole Hcl),Other- Unknown antibiotic and unknown pain medications           Current Medications (02/23/2023):  This is the current hospital active medication list Current Facility-Administered Medications  Medication Dose Route Frequency Provider Last Rate Last Admin   0.9 %  sodium chloride infusion   Intravenous PRN Adefeso, Oladapo, DO   Stopped at 02/20/23 1319   acetaminophen (TYLENOL) tablet 650 mg  650 mg Oral Q6H PRN Emokpae, Ejiroghene E, MD   650 mg at 02/22/23 1225   Or   acetaminophen (TYLENOL) suppository 650 mg  650 mg Rectal Q6H PRN Emokpae, Ejiroghene E, MD       Chlorhexidine Gluconate Cloth 2 % PADS 6 each  6 each Topical  Daily Emokpae, Ejiroghene E, MD   6 each at 02/23/23 1016   dextrose 5 % and 0.9 % NaCl with KCl 20 mEq/L infusion   Intravenous Continuous Emokpae, Ejiroghene E, MD 75 mL/hr at 02/21/23 0813 New Bag at 02/21/23 0813   magic mouthwash  5 mL Oral QID PRN Dorcas Carrow, MD   5 mL at 02/20/23 1558   micafungin (MYCAMINE) 100 mg in sodium chloride 0.9 % 100 mL IVPB  100 mg Intravenous Q24H Arma Heading, RPH 105 mL/hr at 02/22/23 1420 100 mg at 02/22/23 1420   morphine (PF) 2 MG/ML injection 1 mg  1 mg Intravenous Q2H PRN Dorcas Carrow, MD   1 mg at 02/23/23 0515   ondansetron (ZOFRAN) tablet 4 mg  4 mg Oral Q6H PRN Emokpae, Ejiroghene E, MD       Or   ondansetron (ZOFRAN) injection 4 mg  4 mg Intravenous Q6H PRN Emokpae, Ejiroghene E, MD       pantoprazole (PROTONIX) injection 40 mg  40 mg Intravenous Q12H Crosley, Debby, MD   40 mg at 02/23/23 0942   piperacillin-tazobactam (ZOSYN) IVPB 3.375 g  3.375 g Intravenous Q8H Emokpae, Ejiroghene E, MD 12.5 mL/hr at 02/23/23 0534 3.375 g at 02/23/23 0534   sodium chloride (OCEAN) 0.65 % nasal spray 1 spray  1 spray Each Nare PRN Ernie Avena, NP   1 spray at 02/22/23 2343     Discharge Medications: Please see discharge summary for a list of discharge medications.  Relevant Imaging Results:  Relevant Lab Results:   Additional Information [id number]  Delilah Shan, LCSWA

## 2023-02-23 NOTE — Evaluation (Signed)
Physical Therapy Evaluation Patient Details Name: [name] MRN: [id number] DOB: 1945/08/28 Today's Date: 02/23/2023  History of Present Illness  77 yo female [facility]5 from [facility] for n/v x3 days. Workup for SBO. Recent admission 8/24-8/30 for subacute CVA, R ICA stenosis, AKI. PMH includes GIB, HTN, crohn's disease, CVA, SBO s/p hemicolectomy.  Clinical Impression   Pt presents with lethargy, impaired seated balance, debility, and decreased activity tolerance. Pt to benefit from acute PT to address deficits. Pt requiring total +2 assist for moving to/from EOB, pt with heavy posterior bias and unable to tolerate sitting for longer than 1 minute this date. Per pt, she is mostly bedlevel at [facility] at this time. Recommend continued post-acute rehab once d/c from acute setting. PT to progress mobility as tolerated, and will continue to follow acutely.          If plan is discharge home, recommend the following: Help with stairs or ramp for entrance;Assistance with cooking/housework;Two people to help with walking and/or transfers;Assist for transportation;Supervision due to cognitive status;Direct supervision/assist for medications management;Assistance with feeding   Can travel by private vehicle        Equipment Recommendations None recommended by PT  Recommendations for Other Services       Functional Status Assessment Patient has had a recent decline in their functional status and/or demonstrates limited ability to make significant improvements in function in a reasonable and predictable amount of time     Precautions / Restrictions Precautions Precautions: Fall Restrictions Weight Bearing Restrictions: No      Mobility  Bed Mobility Overal bed mobility: Needs Assistance Bed Mobility: Supine to Sit, Sit to Supine     Supine to sit: HOB elevated, Total assist, +2 for physical assistance Sit to supine: +2 for physical assistance, Total assist, HOB  elevated   General bed mobility comments: total +2 all aspects, including boost up in bed    Transfers                   General transfer comment: unable to attempt this date given poor tolerance sitting EOB    Ambulation/Gait                  Stairs            Wheelchair Mobility     Tilt Bed    Modified Rankin (Stroke Patients Only)       Balance Overall balance assessment: Needs assistance Sitting-balance support: Feet supported, No upper extremity supported Sitting balance-Leahy Scale: Poor Sitting balance - Comments: heavy posterior bias, limited hip flexion sitting EOB Postural control: Posterior lean                                   Pertinent Vitals/Pain Pain Assessment Pain Assessment: Faces Faces Pain Scale: Hurts even more Pain Location: generalized with mobility Pain Descriptors / Indicators: Grimacing, Guarding Pain Intervention(s): Limited activity within patient's tolerance, Monitored during session, Repositioned    Home Living Family/patient expects to be discharged to:: Skilled nursing facility                 Home Equipment: Agricultural consultant (2 wheels);Shower seat - built in;Cane - single point;Wheelchair - manual      Prior Function Prior Level of Function : Needs assist             Mobility Comments: two weeks ago, documented  pt is a household ambulator. Pt now states she is mostly bed-level, does get to w/c with assist from staff       Extremity/Trunk Assessment   Upper Extremity Assessment Upper Extremity Assessment: Defer to OT evaluation    Lower Extremity Assessment Lower Extremity Assessment: Generalized weakness (weaker L vs R, history of CVA. 50% AAROM hip and knee with heel slide bilat)    Cervical / Trunk Assessment Cervical / Trunk Assessment: Kyphotic  Communication   Communication Communication: No apparent difficulties  Cognition Arousal: Lethargic Behavior During  Therapy: WFL for tasks assessed/performed Overall Cognitive Status: Impaired/Different from baseline Area of Impairment: Memory, Following commands, Safety/judgement, Awareness, Problem solving                   Current Attention Level: Focused   Following Commands: Follows one step commands inconsistently, Follows one step commands with increased time Safety/Judgement: Decreased awareness of safety, Decreased awareness of deficits Awareness: Intellectual Problem Solving: Slow processing, Decreased initiation, Difficulty sequencing, Requires verbal cues, Requires tactile cues General Comments: requires near-constant stimulation to stay awake during session, closes eyes for a majority of the time. unsure of pt history giving, states she is bedlevel but per previous notes 2 weeks ago pt was walking with rollator        General Comments      Exercises General Exercises - Lower Extremity Heel Slides: AAROM, Both, 5 reps, Supine   Assessment/Plan    PT Assessment Patient needs continued PT services  PT Problem List Decreased strength;Decreased activity tolerance;Decreased balance;Decreased mobility;Decreased coordination;Decreased safety awareness;Decreased range of motion;Decreased knowledge of precautions;Decreased cognition;Decreased knowledge of use of DME;Pain       PT Treatment Interventions DME instruction;Gait training;Functional mobility training;Therapeutic activities;Therapeutic exercise;Balance training;Neuromuscular re-education    PT Goals (Current goals can be found in the Care Plan section)  Acute Rehab PT Goals PT Goal Formulation: Patient unable to participate in goal setting Time For Goal Achievement: 03/09/23 Potential to Achieve Goals: Fair    Frequency Min 1X/week     Co-evaluation PT/OT/SLP Co-Evaluation/Treatment: Yes Reason for Co-Treatment: For patient/therapist safety;To address functional/ADL transfers PT goals addressed during session:  Balance;Mobility/safety with mobility         AM-PAC PT "6 Clicks" Mobility  Outcome Measure Help needed turning from your back to your side while in a flat bed without using bedrails?: A Lot Help needed moving from lying on your back to sitting on the side of a flat bed without using bedrails?: Total Help needed moving to and from a bed to a chair (including a wheelchair)?: Total Help needed standing up from a chair using your arms (e.g., wheelchair or bedside chair)?: Total Help needed to walk in hospital room?: Total Help needed climbing 3-5 steps with a railing? : Total 6 Click Score: 7    End of Session   Activity Tolerance: Patient limited by fatigue;Patient limited by pain Patient left: in bed;with call bell/phone within reach;with bed alarm set Nurse Communication: Mobility status PT Visit Diagnosis: Unsteadiness on feet (R26.81);Other abnormalities of gait and mobility (R26.89);Muscle weakness (generalized) (M62.81)    Time: 1610-9604 PT Time Calculation (min) (ACUTE ONLY): 18 min   Charges:   PT Evaluation $PT Eval Low Complexity: 1 Low   PT General Charges $$ ACUTE PT VISIT: 1 Visit         Marye Round, PT DPT Acute Rehabilitation Services Secure Chat Preferred  Office [phone]   Anyah Swallow E Christain [place] 02/23/2023, 11:31 AM

## 2023-02-23 NOTE — Progress Notes (Signed)
[facility]  Date of Admission:  02/19/2023       Lines: Peripheral iv's  Abx: 9/5-c micafungin  ASSESSMENT: 77 yo female hx cva and left hemiparesis, crohn's disease, admitted 9/5 from nursing home for n/v/abd pain found to have sbo and concomittant candida glabrata fungemia  9/5 ucx ecoli R cipro, S bactrim 9/5 bcx 1 of 4 bottles with yeast; bcid candida glabrata 9/8 repeat bcx ngtd  Afebrile on admission; 20 wbc leukocytosis had resolved  Clinically doing better and starting to eat.  No focal joint/back pain or visual sx to suggest metastatic complication of candida glabrata fungemia  Tte unremarkable   PLAN: Sensitivity profile asked but won't be back for another 2 weeks Would finish 2 week course antifungal with micafungin No other abx needed Would recommend outpatient baseline dilated fundoscopic eye exam to make sure no vitrious involvement with fungemia If repeat bcx 9/8 remains negative on 9/10 can place midline to finish iv micafungin Id and opat f/u as below Discussed with primary team      OPAT Orders Discharge antibiotics to be given via PICC line Discharge antibiotics: Micafungin 100 mg iv daily  OPAT Order Details             .Outpatient Parenteral Antibiotic Therapy Consult  Until discontinued       Provider:  (Not yet assigned)  Question Answer Comment  Antibiotic Other   Comments micafungin   Indications for use Candida glabrata fungermia   End Date 03/08/2023   Approving ID Provider's Name Jamey Demchak                 [facility] Care Per Protocol:  Home health RN for IV administration and teaching; PICC line care and labs.    Labs weekly while on IV antibiotics: _x_ CBC with differential __ BMP _x_ CMP __ CRP __ ESR __ Vancomycin trough __ CK  _x_ Please pull PIC at completion of IV antibiotics __ Please leave PIC in place until doctor has seen patient or been notified  Fax weekly labs to [phone]  Clinic Follow Up Appt: 10/4 @ 1030  @  [facility] [address], [place], [place] [address] Phone: [phone]    Principal Problem:   Small bowel obstruction (HCC) Active Problems:   Crohn's disease (HCC)   HTN (hypertension)   Upper GI bleed   Stroke (cerebrum) (HCC)   AKI (acute kidney injury) (HCC)   Sepsis secondary to UTI (HCC)   Urinary retention   Abnormal ECG   Demand ischemia   Aortic dissection (HCC)   Allergies  Allergen Reactions   Flagyl [Metronidazole Hcl] Other (See Comments)    "makes me feel whoozy and weird"   Other     Unknown antibiotic and unknown pain medications     Scheduled Meds:  Chlorhexidine Gluconate Cloth  6 each Topical Daily   feeding supplement  237 mL Oral BID BM   pantoprazole (PROTONIX) IV  40 mg Intravenous Q12H   potassium chloride  40 mEq Oral Once   Continuous Infusions:  sodium chloride Stopped (02/20/23 1319)   micafungin (MYCAMINE) 100 mg in sodium chloride 0.9 % 100 mL IVPB 100 mg (02/23/23 1422)   potassium PHOSPHATE IVPB (in mmol)     PRN Meds:.sodium chloride, acetaminophen **OR** acetaminophen, magic mouthwash, morphine injection, ondansetron **OR** ondansetron (ZOFRAN) IV, sodium chloride   SUBJECTIVE: Feeling better No n/v Eating some Still weak  over all  No headache, focal joint pain, visual concern    Review of Systems: ROS All other ROS was negative, except mentioned above     OBJECTIVE: Vitals:   02/23/23 0109 02/23/23 0311 02/23/23 1021 02/23/23 1203  BP: 139/73 (!) 143/96 (!) 142/79 (!) 144/82  Pulse: 78 79 75 81  Resp: 18 12 12 12   Temp:   98.3 F (36.8 C) 98 F (36.7 C)  TempSrc:   Oral Oral  SpO2: 95% 97% 95% 94%  Weight:      Height:       Body mass index is 19.1 kg/m.  Physical Exam General/constitutional: no distress, pleasant, conversant, lying in bed HEENT: Normocephalic, PER, Conj Clear, EOMI, Oropharynx clear Neck supple CV: rrr no mrg Lungs:  clear to auscultation, normal respiratory effort Abd: Soft, Nontender Ext: no edema Skin: No Rash Neuro: alert/oriented; no new deficit MSK: no peripheral joint swelling/tenderness/warmth; back spines nontender   Lab Results Lab Results  Component Value Date   WBC 10.1 02/23/2023   HGB 12.2 02/23/2023   HCT 36.3 02/23/2023   MCV 92.4 02/23/2023   PLT 155 02/23/2023    Lab Results  Component Value Date   CREATININE 0.85 02/23/2023   BUN 12 02/23/2023   NA 133 (L) 02/23/2023   K 3.5 02/23/2023   CL 102 02/23/2023   CO2 24 02/23/2023    Lab Results  Component Value Date   ALT 18 02/23/2023   AST 28 02/23/2023   ALKPHOS 38 02/23/2023   BILITOT 0.9 02/23/2023      Microbiology: Recent Results (from the past 240 hour(s))  SARS Coronavirus 2 by RT PCR (hospital order, performed in [facility] hospital lab) *cepheid single result test* Anterior Nasal Swab     Status: None   Collection Time: 02/19/23  2:13 PM   Specimen: Anterior Nasal Swab  Result Value Ref Range Status   SARS Coronavirus 2 by RT PCR NEGATIVE NEGATIVE Final    Comment: (NOTE) SARS-CoV-2 target nucleic acids are NOT DETECTED.  The SARS-CoV-2 RNA is generally detectable in upper and lower respiratory specimens during the acute phase of infection. The lowest concentration of SARS-CoV-2 viral copies this assay can detect is 250 copies / mL. A negative result does not preclude SARS-CoV-2 infection and should not be used as the sole basis for treatment or other patient management decisions.  A negative result may occur with improper specimen collection / handling, submission of specimen other than nasopharyngeal swab, presence of viral mutation(s) within the areas targeted by this assay, and inadequate number of viral copies (<250 copies / mL). A negative result must be combined with clinical observations, patient history, and epidemiological information.  Fact Sheet for Patients:    RoadLapTop.co.za  Fact Sheet for Healthcare Providers: http://kim-miller.com/  This test is not yet approved or  cleared by the [place] FDA and has been authorized for detection and/or diagnosis of SARS-CoV-2 by FDA under an Emergency Use Authorization (EUA).  This EUA will remain in effect (meaning this test can be used) for the duration of the COVID-19 declaration under Section 564(b)(1) of the Act, 21 U.S.C. section 360bbb-3(b)(1), unless the authorization is terminated or revoked sooner.  Performed at [facility], [address], [place], [place] [address]   Culture, blood (Routine X 2) w Reflex to ID Panel     Status: None (Preliminary result)   Collection Time: 02/19/23  9:30 PM   Specimen: BLOOD  Result Value Ref Range Status  Specimen Description BLOOD BLOOD RIGHT HAND  Final   Special Requests   Final    BOTTLES DRAWN AEROBIC ONLY Blood Culture results may not be optimal due to an inadequate volume of blood received in culture bottles   Culture   Final    NO GROWTH 4 DAYS Performed at [facility], [address], [place], [place] [address]    Report Status PENDING  Incomplete  Urine Culture (for pregnant, neutropenic or urologic patients or patients with an indwelling urinary catheter)     Status: Abnormal   Collection Time: 02/19/23  9:30 PM   Specimen: Urine, Catheterized  Result Value Ref Range Status   Specimen Description   Final    URINE, CATHETERIZED Performed at [facility], [address], [place], [place] [address]    Special Requests   Final    NONE Performed at [facility], [address], [place], [place] [address]    Culture >=100,000 COLONIES/mL ESCHERICHIA COLI (A)  Final   Report Status 02/22/2023 FINAL  Final   Organism ID, Bacteria ESCHERICHIA COLI (A)  Final      Susceptibility   Escherichia coli - MIC*    AMPICILLIN >=32 RESISTANT Resistant     CEFAZOLIN <=4 SENSITIVE Sensitive      CEFEPIME <=0.12 SENSITIVE Sensitive     CEFTRIAXONE <=0.25 SENSITIVE Sensitive     CIPROFLOXACIN >=4 RESISTANT Resistant     GENTAMICIN <=1 SENSITIVE Sensitive     IMIPENEM <=0.25 SENSITIVE Sensitive     NITROFURANTOIN <=16 SENSITIVE Sensitive     TRIMETH/SULFA <=20 SENSITIVE Sensitive     AMPICILLIN/SULBACTAM 8 SENSITIVE Sensitive     PIP/TAZO <=4 SENSITIVE Sensitive     * >=100,000 COLONIES/mL ESCHERICHIA COLI  Culture, blood (Routine X 2) w Reflex to ID Panel     Status: None (Preliminary result)   Collection Time: 02/19/23 11:19 PM   Specimen: BLOOD  Result Value Ref Range Status   Specimen Description   Final    BLOOD LEFT ANTECUBITAL Performed at [facility], [address] [place], [place] [address]    Special Requests   Final    BOTTLES DRAWN AEROBIC AND ANAEROBIC Blood Culture results may not be optimal due to an excessive volume of blood received in culture bottles Performed at [facility], [address], [place], [place] [address]    Culture  Setup Time   Final    YEAST AEROBIC BOTTLE ONLY Gram Stain Report Called to,Read Back By and Verified With: L WALKER AT [facility] AT 0925 ON [id number] BY S [place] CRITICAL RESULT CALLED TO, READ BACK BY AND VERIFIED WITH: PHARMD JESSICA MILLEN [id number] AT 1414 BY EC Performed at [facility], [address] [place], [place] [address]    Culture YEAST  Final   Report Status PENDING  Incomplete  Blood Culture ID Panel (Reflexed)     Status: Abnormal   Collection Time: 02/19/23 11:19 PM  Result Value Ref Range Status   Enterococcus faecalis NOT DETECTED NOT DETECTED Final   Enterococcus Faecium NOT DETECTED NOT DETECTED Final   Listeria monocytogenes NOT DETECTED NOT DETECTED Final   Staphylococcus species NOT DETECTED NOT DETECTED Final   Staphylococcus aureus (BCID) NOT DETECTED NOT DETECTED Final   Staphylococcus epidermidis NOT DETECTED NOT DETECTED Final   Staphylococcus lugdunensis NOT DETECTED NOT DETECTED Final    Streptococcus species NOT DETECTED NOT DETECTED Final   Streptococcus agalactiae NOT DETECTED NOT DETECTED Final   Streptococcus pneumoniae NOT DETECTED NOT  DETECTED Final   Streptococcus pyogenes NOT DETECTED NOT DETECTED Final   A.calcoaceticus-baumannii NOT DETECTED NOT DETECTED Final   Bacteroides fragilis NOT DETECTED NOT DETECTED Final   Enterobacterales NOT DETECTED NOT DETECTED Final   Enterobacter cloacae complex NOT DETECTED NOT DETECTED Final   Escherichia coli NOT DETECTED NOT DETECTED Final   Klebsiella aerogenes NOT DETECTED NOT DETECTED Final   Klebsiella oxytoca NOT DETECTED NOT DETECTED Final   Klebsiella pneumoniae NOT DETECTED NOT DETECTED Final   Proteus species NOT DETECTED NOT DETECTED Final   Salmonella species NOT DETECTED NOT DETECTED Final   Serratia marcescens NOT DETECTED NOT DETECTED Final   Haemophilus influenzae NOT DETECTED NOT DETECTED Final   Neisseria meningitidis NOT DETECTED NOT DETECTED Final   Pseudomonas aeruginosa NOT DETECTED NOT DETECTED Final   Stenotrophomonas maltophilia NOT DETECTED NOT DETECTED Final   Candida albicans NOT DETECTED NOT DETECTED Final   Candida auris NOT DETECTED NOT DETECTED Final   Candida glabrata DETECTED (A) NOT DETECTED Final    Comment: CRITICAL RESULT CALLED TO, READ BACK BY AND VERIFIED WITH: PHARMD JESSICA MILLEN [id number] AT 1414 BY EC    Candida krusei NOT DETECTED NOT DETECTED Final   Candida parapsilosis NOT DETECTED NOT DETECTED Final   Candida tropicalis NOT DETECTED NOT DETECTED Final   Cryptococcus neoformans/gattii NOT DETECTED NOT DETECTED Final    Comment: Performed at [facility], [address] [place], [place] [address]  Culture, blood (Routine X 2) w Reflex to ID Panel     Status: None (Preliminary result)   Collection Time: 02/22/23 11:10 AM   Specimen: BLOOD LEFT ARM  Result Value Ref Range Status   Specimen Description BLOOD LEFT ARM  Final   Special Requests   Final    BOTTLES DRAWN  AEROBIC AND ANAEROBIC Blood Culture adequate volume   Culture   Final    NO GROWTH < 24 HOURS Performed at [facility], [address], [place], [place] [address]    Report Status PENDING  Incomplete  Culture, blood (Routine X 2) w Reflex to ID Panel     Status: None (Preliminary result)   Collection Time: 02/22/23 11:21 AM   Specimen: BLOOD RIGHT HAND  Result Value Ref Range Status   Specimen Description BLOOD RIGHT HAND  Final   Special Requests   Final    BOTTLES DRAWN AEROBIC ONLY Blood Culture results may not be optimal due to an inadequate volume of blood received in culture bottles   Culture   Final    NO GROWTH < 24 HOURS Performed at [facility], [address], [place], [place] [address]    Report Status PENDING  Incomplete     Serology:   Imaging: If present, new imagings (plain films, ct scans, and mri) have been personally visualized and interpreted; radiology reports have been reviewed. Decision making incorporated into the Impression / Recommendations.   9/6 cxr Normal study   02/20/23 tte  1. Left ventricular ejection fraction, by estimation, is 65 to 70%. The left ventricle has normal function. The left ventricle has no regional wall motion abnormalities. Left ventricular diastolic parameters are consistent with Grade I diastolic  dysfunction (impaired relaxation).   2. Right ventricular systolic function is normal. The right ventricular size is mildly enlarged. There is normal pulmonary artery systolic pressure. The estimated right ventricular systolic pressure is 19.6 mmHg.   3. The mitral valve is grossly normal. Trivial mitral valve  regurgitation. No evidence  of mitral stenosis.   4. The aortic valve is tricuspid. There is mild calcification of the aortic valve. There is mild thickening of the aortic valve. Aortic valve regurgitation is not visualized. Aortic valve sclerosis/calcification is present, without any evidence of  aortic stenosis.  Aortic valve area, by VTI measures 1.74 cm.   5. The inferior vena cava is normal in size with greater than 50% respiratory variability, suggesting right atrial pressure of 3 mmHg.   Comparison(s): No significant change from prior study.    9/5 ct abd pelv 1. Small-bowel obstruction with transition point in the low posterior abdomen.    Raymondo Band, MD [facility] [phone] pager    02/23/2023, 2:22 PM

## 2023-02-23 NOTE — Progress Notes (Addendum)
PROGRESS NOTE    [name]  ZOX:[id number] DOB: [birth year] DOA: 02/19/2023 PCP: Assunta Found, MD     Brief Narrative:  [name] is a 77 year old with history of previous GI bleed, hypertension, Crohn's disease, recent stroke with left hemiparesis, recent admission to the hospital 8/24-8/30 for acute stroke and discharged to a skilled nursing home.  She presents back to the emergency room from SNF with abdominal pain, distention and nausea vomiting over the last 3 days.  Also reported headache and back pain. At the emergency room normotensive.  Normothermic.  Heart rate 111-127.  Oxygen 93% on room air.  After given a dose of fentanyl, oxygen saturations dropped to 87%.  Leukocytosis with white blood cell count of 29,000.  Potassium 3.4.  Patient was started on IV fluid boluses, cultures were collected and is started on IV Zosyn.  She had 2200 mL urine, Foley was inserted.  CT scan abdomen pelvis showed small bowel obstruction with transition point in the lower posterior abdomen. Initial EKG suggested ST elevation, ruled out.  Patient was ultimately transferred to [facility] for higher level of care. Hospitalization further complicated by concern for GI bleed, UTI, Candida bacteremia.  New events last 24 hours / Subjective: No complaints of abdominal pain or nausea.  Has been tolerating diet.  Assessment & Plan:  Principal Problem:   Small bowel obstruction (HCC) Active Problems:   Sepsis secondary to UTI (HCC)   Urinary retention   Crohn's disease (HCC)   Upper GI bleed   HTN (hypertension)   Stroke (cerebrum) (HCC)   AKI (acute kidney injury) (HCC)   Abnormal ECG   Demand ischemia   Aortic dissection (HCC)   Partial small bowel obstruction due to adhesions: -General Surgery now signed off.  Tolerating diet.  Resolved.   Sepsis present on admission secondary to E. coli UTI:  -Completed Zosyn  Candida bacteremia: -Discussed with Dr. Renold Don today, plan for 2 weeks of  micafungin.  Follow-up outpatient ophthalmology to rule out endophthalmitis. Repeat blood cultures pending, if blood cultures remain negative and afebrile, can consider PICC placement for discharge.    Abnormal EKG with ST elevation:  -Seen by cardiology.  Mildly elevated troponins, echo findings unremarkable.  Normal ejection fraction.  Patient with acute small bowel obstruction, recent stroke.  Symptomatic treatment.  Not a candidate for anticoagulation or left heart cath.   Acute GI bleeding:  -Suspect lower GI bleeding.  NG tube suction without evidence of bleeding. Patient is not a candidate for anesthesia endoscopy now. Started on PPI twice daily.   History of stroke:  -Aspirin and Plavix now on hold due to concern for GI bleeding    Acute urinary retention:  -Foley catheter in place   Goal of care: -Palliative care medicine following    DVT prophylaxis:  SCDs Start: 02/20/23 0338  Code Status: DNR Family Communication: None Disposition Plan: SNF Status is: Inpatient Remains inpatient appropriate because: IV therapies    Antimicrobials:  Anti-infectives (From admission, onward)    Start     Dose/Rate Route Frequency Ordered Stop   02/23/23 1400  fluconazole (DIFLUCAN) IVPB 400 mg  Status:  Discontinued       Placed in "Followed by" Linked Group   400 mg 100 mL/hr over 120 Minutes Intravenous Every 24 hours 02/22/23 1148 02/22/23 1149   02/22/23 1400  fluconazole (DIFLUCAN) IVPB 800 mg  Status:  Discontinued       Placed in "Followed by" Linked Group  800 mg 200 mL/hr over 120 Minutes Intravenous  Once 02/22/23 1148 02/22/23 1149   02/22/23 1400  micafungin (MYCAMINE) 100 mg in sodium chloride 0.9 % 100 mL IVPB        100 mg 105 mL/hr over 1 Hours Intravenous Every 24 hours 02/22/23 1204     02/19/23 2200  piperacillin-tazobactam (ZOSYN) IVPB 3.375 g  Status:  Discontinued        3.375 g 12.5 mL/hr over 240 Minutes Intravenous Every 8 hours 02/19/23 1450 02/23/23  1233   02/19/23 1445  piperacillin-tazobactam (ZOSYN) IVPB 3.375 g        3.375 g 100 mL/hr over 30 Minutes Intravenous  Once 02/19/23 1433 02/19/23 1630        Objective: Vitals:   02/23/23 0109 02/23/23 0311 02/23/23 1021 02/23/23 1203  BP: 139/73 (!) 143/96 (!) 142/79 (!) 144/82  Pulse: 78 79 75 81  Resp: 18 12 12 12   Temp:   98.3 F (36.8 C) 98 F (36.7 C)  TempSrc:   Oral Oral  SpO2: 95% 97% 95% 94%  Weight:      Height:        Intake/Output Summary (Last 24 hours) at 02/23/2023 1303 Last data filed at 02/23/2023 0600 Gross per 24 hour  Intake 715.4 ml  Output 550 ml  Net 165.4 ml   Filed Weights   02/19/23 1403 02/20/23 0318  Weight: 54.6 kg 52.1 kg    Examination:  General exam: Appears calm and comfortable , chronically ill-appearing Respiratory system: Clear to auscultation. Respiratory effort normal. No respiratory distress. No conversational dyspnea.  Cardiovascular system: S1 & S2 heard, RRR. No murmurs. No pedal edema. Gastrointestinal system: Abdomen is nondistended, soft and nontender. Normal bowel sounds heard. Central nervous system: Alert and oriented. No focal neurological deficits.  Psychiatry: Judgement and insight appear normal. Mood & affect appropriate.   Data Reviewed: I have personally reviewed following labs and imaging studies  CBC: Recent Labs  Lab 02/19/23 1415 02/19/23 1424 02/20/23 0150 02/20/23 0642 02/21/23 0414 02/22/23 0446 02/23/23 0434  WBC 29.2*  --   --  20.2* 11.2* 9.9 10.1  NEUTROABS 26.8*  --   --   --  9.4* 7.8* 7.7  HGB 9.5*   < > 7.5* 7.6* 6.8* 12.4 12.2  HCT 28.1*   < > 22.0* 23.9* 22.6* 37.5 36.3  MCV 102.9*  --   --  108.1* 109.7* 94.5 92.4  PLT 343  --   --  220 214 169 155   < > = values in this interval not displayed.   Basic Metabolic Panel: Recent Labs  Lab 02/19/23 1415 02/19/23 1424 02/20/23 0150 02/20/23 0642 02/21/23 0414 02/22/23 0446 02/23/23 0434  NA 136   < > 141 141 143 142 133*  K  3.4*   < > 2.7* 2.6* 2.9* 3.7 3.5  CL 93*   < > 103 106 112* 111 102  CO2 22  --   --  24 24 24 24   GLUCOSE 136*   < > 109* 126* 140* 113* 87  BUN 51*   < > 39* 36* 20 13 12   CREATININE 1.38*   < > 1.10* 1.02* 0.77 0.83 0.85  CALCIUM 9.6  --   --  8.5* 8.0* 8.1* 7.8*  MG  --   --   --   --  1.7 1.6* 1.9  PHOS  --   --   --   --  1.5*  --  2.1*   < > =  values in this interval not displayed.   GFR: Estimated Creatinine Clearance: 44.9 mL/min (by C-G formula based on SCr of 0.85 mg/dL). Liver Function Tests: Recent Labs  Lab 02/19/23 1415 02/22/23 0446 02/23/23 0434  AST 29 25 28   ALT 23 17 18   ALKPHOS 49 31* 38  BILITOT 1.0 0.4 0.9  PROT 6.2* 4.5* 4.6*  ALBUMIN 3.4* 2.0* 2.1*   Recent Labs  Lab 02/19/23 1415  LIPASE 64*   No results for input(s): "AMMONIA" in the last 168 hours. Coagulation Profile: No results for input(s): "INR", "PROTIME" in the last 168 hours. Cardiac Enzymes: No results for input(s): "CKTOTAL", "CKMB", "CKMBINDEX", "TROPONINI" in the last 168 hours. BNP (last 3 results) No results for input(s): "PROBNP" in the last 8760 hours. HbA1C: No results for input(s): "HGBA1C" in the last 72 hours. CBG: Recent Labs  Lab 02/22/23 0127 02/22/23 1051 02/22/23 1737 02/23/23 0005 02/23/23 0748  GLUCAP 128* 98 98 110* 88   Lipid Profile: No results for input(s): "CHOL", "HDL", "LDLCALC", "TRIG", "CHOLHDL", "LDLDIRECT" in the last 72 hours. Thyroid Function Tests: No results for input(s): "TSH", "T4TOTAL", "FREET4", "T3FREE", "THYROIDAB" in the last 72 hours. Anemia Panel: No results for input(s): "VITAMINB12", "FOLATE", "FERRITIN", "TIBC", "IRON", "RETICCTPCT" in the last 72 hours. Sepsis Labs: Recent Labs  Lab 02/19/23 2139 02/20/23 0149  LATICACIDVEN 1.3 1.1    Recent Results (from the past 240 hour(s))  SARS Coronavirus 2 by RT PCR (hospital order, performed in [facility] hospital lab) *cepheid single result test* Anterior Nasal Swab     Status:  None   Collection Time: 02/19/23  2:13 PM   Specimen: Anterior Nasal Swab  Result Value Ref Range Status   SARS Coronavirus 2 by RT PCR NEGATIVE NEGATIVE Final    Comment: (NOTE) SARS-CoV-2 target nucleic acids are NOT DETECTED.  The SARS-CoV-2 RNA is generally detectable in upper and lower respiratory specimens during the acute phase of infection. The lowest concentration of SARS-CoV-2 viral copies this assay can detect is 250 copies / mL. A negative result does not preclude SARS-CoV-2 infection and should not be used as the sole basis for treatment or other patient management decisions.  A negative result may occur with improper specimen collection / handling, submission of specimen other than nasopharyngeal swab, presence of viral mutation(s) within the areas targeted by this assay, and inadequate number of viral copies (<250 copies / mL). A negative result must be combined with clinical observations, patient history, and epidemiological information.  Fact Sheet for Patients:   RoadLapTop.co.za  Fact Sheet for Healthcare Providers: http://kim-miller.com/  This test is not yet approved or  cleared by the [place] FDA and has been authorized for detection and/or diagnosis of SARS-CoV-2 by FDA under an Emergency Use Authorization (EUA).  This EUA will remain in effect (meaning this test can be used) for the duration of the COVID-19 declaration under Section 564(b)(1) of the Act, 21 U.S.C. section 360bbb-3(b)(1), unless the authorization is terminated or revoked sooner.  Performed at [facility], [address], [place], [place] [address]   Culture, blood (Routine X 2) w Reflex to ID Panel     Status: None (Preliminary result)   Collection Time: 02/19/23  9:30 PM   Specimen: BLOOD  Result Value Ref Range Status   Specimen Description BLOOD BLOOD RIGHT HAND  Final   Special Requests   Final    BOTTLES DRAWN AEROBIC ONLY Blood  Culture results may not be optimal due to an inadequate volume of  blood received in culture bottles   Culture   Final    NO GROWTH 4 DAYS Performed at [facility], [address] [place], [place] [address]    Report Status PENDING  Incomplete  Urine Culture (for pregnant, neutropenic or urologic patients or patients with an indwelling urinary catheter)     Status: Abnormal   Collection Time: 02/19/23  9:30 PM   Specimen: Urine, Catheterized  Result Value Ref Range Status   Specimen Description   Final    URINE, CATHETERIZED Performed at [facility], [address] [place], [place] [address]    Special Requests   Final    NONE Performed at [facility], [address], [place], [place] [address]    Culture >=100,000 COLONIES/mL ESCHERICHIA COLI (A)  Final   Report Status 02/22/2023 FINAL  Final   Organism ID, Bacteria ESCHERICHIA COLI (A)  Final      Susceptibility   Escherichia coli - MIC*    AMPICILLIN >=32 RESISTANT Resistant     CEFAZOLIN <=4 SENSITIVE Sensitive     CEFEPIME <=0.12 SENSITIVE Sensitive     CEFTRIAXONE <=0.25 SENSITIVE Sensitive     CIPROFLOXACIN >=4 RESISTANT Resistant     GENTAMICIN <=1 SENSITIVE Sensitive     IMIPENEM <=0.25 SENSITIVE Sensitive     NITROFURANTOIN <=16 SENSITIVE Sensitive     TRIMETH/SULFA <=20 SENSITIVE Sensitive     AMPICILLIN/SULBACTAM 8 SENSITIVE Sensitive     PIP/TAZO <=4 SENSITIVE Sensitive     * >=100,000 COLONIES/mL ESCHERICHIA COLI  Culture, blood (Routine X 2) w Reflex to ID Panel     Status: None (Preliminary result)   Collection Time: 02/19/23 11:19 PM   Specimen: BLOOD  Result Value Ref Range Status   Specimen Description   Final    BLOOD LEFT ANTECUBITAL Performed at [facility], [address] [place], [place] [address]    Special Requests   Final    BOTTLES DRAWN AEROBIC AND ANAEROBIC Blood Culture results may not be optimal due to an excessive volume of blood received in culture bottles Performed at [facility], [address] [place], [place] [address]    Culture  Setup Time   Final    YEAST AEROBIC BOTTLE ONLY Gram Stain Report Called to,Read Back By and Verified With: L WALKER AT [facility] AT 0925 ON [id number] BY S [place] CRITICAL RESULT CALLED TO, READ BACK BY AND VERIFIED WITH: PHARMD JESSICA MILLEN [id number] AT 1414 BY EC Performed at [facility], [address], [place], [place] [address]    Culture YEAST  Final   Report Status PENDING  Incomplete  Blood Culture ID Panel (Reflexed)     Status: Abnormal   Collection Time: 02/19/23 11:19 PM  Result Value Ref Range Status   Enterococcus faecalis NOT DETECTED NOT DETECTED Final   Enterococcus Faecium NOT DETECTED NOT DETECTED Final   Listeria monocytogenes NOT DETECTED NOT DETECTED Final   Staphylococcus species NOT DETECTED NOT DETECTED Final   Staphylococcus aureus (BCID) NOT DETECTED NOT DETECTED Final   Staphylococcus epidermidis NOT DETECTED NOT DETECTED Final   Staphylococcus lugdunensis NOT DETECTED NOT DETECTED Final   Streptococcus species NOT DETECTED NOT DETECTED Final   Streptococcus agalactiae NOT DETECTED NOT DETECTED Final   Streptococcus pneumoniae NOT DETECTED NOT DETECTED Final   Streptococcus pyogenes NOT DETECTED NOT DETECTED Final   A.calcoaceticus-baumannii NOT DETECTED NOT DETECTED Final   Bacteroides fragilis NOT DETECTED NOT DETECTED Final   Enterobacterales NOT DETECTED NOT DETECTED Final  Enterobacter cloacae complex NOT DETECTED NOT DETECTED Final   Escherichia coli NOT DETECTED NOT DETECTED Final   Klebsiella aerogenes NOT DETECTED NOT DETECTED Final   Klebsiella oxytoca NOT DETECTED NOT DETECTED Final   Klebsiella pneumoniae NOT DETECTED NOT DETECTED Final   Proteus species NOT DETECTED NOT DETECTED Final   Salmonella species NOT DETECTED NOT DETECTED Final   Serratia marcescens NOT DETECTED NOT DETECTED Final   Haemophilus influenzae NOT DETECTED NOT DETECTED Final   Neisseria meningitidis NOT DETECTED  NOT DETECTED Final   Pseudomonas aeruginosa NOT DETECTED NOT DETECTED Final   Stenotrophomonas maltophilia NOT DETECTED NOT DETECTED Final   Candida albicans NOT DETECTED NOT DETECTED Final   Candida auris NOT DETECTED NOT DETECTED Final   Candida glabrata DETECTED (A) NOT DETECTED Final    Comment: CRITICAL RESULT CALLED TO, READ BACK BY AND VERIFIED WITH: PHARMD JESSICA MILLEN [id number] AT 1414 BY EC    Candida krusei NOT DETECTED NOT DETECTED Final   Candida parapsilosis NOT DETECTED NOT DETECTED Final   Candida tropicalis NOT DETECTED NOT DETECTED Final   Cryptococcus neoformans/gattii NOT DETECTED NOT DETECTED Final    Comment: Performed at [facility], [address], [place], [place] [address]  Culture, blood (Routine X 2) w Reflex to ID Panel     Status: None (Preliminary result)   Collection Time: 02/22/23 11:10 AM   Specimen: BLOOD LEFT ARM  Result Value Ref Range Status   Specimen Description BLOOD LEFT ARM  Final   Special Requests   Final    BOTTLES DRAWN AEROBIC AND ANAEROBIC Blood Culture adequate volume   Culture   Final    NO GROWTH < 24 HOURS Performed at [facility], [address], [place], [place] [address]    Report Status PENDING  Incomplete  Culture, blood (Routine X 2) w Reflex to ID Panel     Status: None (Preliminary result)   Collection Time: 02/22/23 11:21 AM   Specimen: BLOOD RIGHT HAND  Result Value Ref Range Status   Specimen Description BLOOD RIGHT HAND  Final   Special Requests   Final    BOTTLES DRAWN AEROBIC ONLY Blood Culture results may not be optimal due to an inadequate volume of blood received in culture bottles   Culture   Final    NO GROWTH < 24 HOURS Performed at [facility], [address], [place], [place] [address]    Report Status PENDING  Incomplete      Radiology Studies: No results found.    Scheduled Meds:  Chlorhexidine Gluconate Cloth  6 each Topical Daily   pantoprazole (PROTONIX) IV  40 mg  Intravenous Q12H   Continuous Infusions:  sodium chloride Stopped (02/20/23 1319)   dextrose 5 % and 0.9 % NaCl with KCl 20 mEq/L 75 mL/hr at 02/21/23 0813   micafungin (MYCAMINE) 100 mg in sodium chloride 0.9 % 100 mL IVPB 100 mg (02/22/23 1420)     LOS: 3 days   Time spent: 35 minutes   Noralee Stain, DO [facility] 02/23/2023, 1:03 PM   Available via Epic secure chat 7am-7pm After these hours, please refer to coverage provider listed on [email or web address]

## 2023-02-23 NOTE — Progress Notes (Signed)
   Subjective/Chief Complaint: Needs to have a BM right now.  Ate well yesterday.  Not hungry today.  No pain or nausea.   Objective: Vital signs in last 24 hours: Temp:  [97.8 F (36.6 C)-98 F (36.7 C)] 97.9 F (36.6 C) (09/09 0006) Pulse Rate:  [77-87] 79 (09/09 0311) Resp:  [12-21] 12 (09/09 0311) BP: (133-161)/(73-96) 143/96 (09/09 0311) SpO2:  [95 %-97 %] 97 % (09/09 0311) Last BM Date : 02/22/23  Intake/Output from previous day: 09/08 0701 - 09/09 0700 In: 715.4 [P.O.:450; IV Piggyback:265.4] Out: 550 [Urine:550] Intake/Output this shift: No intake/output data recorded.  Abd: soft nontender nd   Lab Results:  Recent Labs    02/22/23 0446 02/23/23 0434  WBC 9.9 10.1  HGB 12.4 12.2  HCT 37.5 36.3  PLT 169 155   BMET Recent Labs    02/22/23 0446 02/23/23 0434  NA 142 133*  K 3.7 3.5  CL 111 102  CO2 24 24  GLUCOSE 113* 87  BUN 13 12  CREATININE 0.83 0.85  CALCIUM 8.1* 7.8*   PT/INR No results for input(s): "LABPROT", "INR" in the last 72 hours. ABG No results for input(s): "PHART", "HCO3" in the last 72 hours.  Invalid input(s): "PCO2", "PO2"  Studies/Results: No results found.  Anti-infectives: Anti-infectives (From admission, onward)    Start     Dose/Rate Route Frequency Ordered Stop   02/23/23 1400  fluconazole (DIFLUCAN) IVPB 400 mg  Status:  Discontinued       Placed in "Followed by" Linked Group   400 mg 100 mL/hr over 120 Minutes Intravenous Every 24 hours 02/22/23 1148 02/22/23 1149   02/22/23 1400  fluconazole (DIFLUCAN) IVPB 800 mg  Status:  Discontinued       Placed in "Followed by" Linked Group   800 mg 200 mL/hr over 120 Minutes Intravenous  Once 02/22/23 1148 02/22/23 1149   02/22/23 1400  micafungin (MYCAMINE) 100 mg in sodium chloride 0.9 % 100 mL IVPB        100 mg 105 mL/hr over 1 Hours Intravenous Every 24 hours 02/22/23 1204     02/19/23 2200  piperacillin-tazobactam (ZOSYN) IVPB 3.375 g        3.375 g 12.5 mL/hr  over 240 Minutes Intravenous Every 8 hours 02/19/23 1450     02/19/23 1445  piperacillin-tazobactam (ZOSYN) IVPB 3.375 g        3.375 g 100 mL/hr over 30 Minutes Intravenous  Once 02/19/23 1433 02/19/23 1630       Assessment/Plan: pSBO with possible UGI bleed -8-hr delay films reviewed and contrast in her colon.  She is moving her bowels -tolerating diet -hgb stable -no further surgical needs.  We are available as needed    FEN - soft VTE - on hold due to anemia ID - zosyn, for UTI   UTI Recent CVA EKG changes HTN Crohn's disease AKI Anemia    I reviewed Consultant cardiology notes, hospitalist notes, last 24 h vitals and pain scores, last 48 h intake and output, last 24 h labs and trends, and last 24 h imaging results.  [name] 02/23/2023

## 2023-02-23 NOTE — Progress Notes (Signed)
PHARMACY CONSULT NOTE FOR:  OUTPATIENT  PARENTERAL ANTIBIOTIC THERAPY (OPAT)  Indication: Candidemia Regimen: Micafungin 100 mg IV q24h End date: 03/08/23  IV antibiotic discharge orders are pended. To discharging provider:  please sign these orders via discharge navigator,  Select New Orders & click on the button choice - Manage This Unsigned Work.   Thank you for allowing pharmacy to be a part of this patient's care.  Lora Paula, PharmD PGY-2 Infectious Diseases Pharmacy Resident 02/23/2023 1:52 PM

## 2023-02-23 NOTE — TOC Initial Note (Addendum)
Transition of Care [facility]) - Initial/Assessment Note    Patient Details  Name: [name] MRN: [id number] Date of Birth: 12/12/1945  Transition of [facility]) CM/SW Contact:    Delilah Shan, LCSWA Phone Number: 02/23/2023, 11:43 AM  Clinical Narrative:                  CSW received consult for possible SNF placement at time of discharge. Due to patients current orientation CSW spoke with patients daughter [name] regarding PT recommendation of SNF placement at time of discharge.  Patients daughter expressed understanding of PT recommendation and is agreeable to SNF placement at time of discharge. Patients daughter informed CSW that plan is for patient to return back to [facility] for short term rehab.CSW discussed insurance authorization process.  No further questions reported at this time. CSW following to start insurance authorization closer to patient being medically ready for dc.CSW to continue to follow and assist with discharge planning needs.   Expected Discharge Plan: Skilled Nursing Facility Barriers to Discharge: Continued Medical Work up   Patient Goals and CMS Choice   CMS Medicare.gov Compare Post Acute Care list provided to:: Patient Represenative (must comment) (Patients daughter [name]) Choice offered to / list presented to : Adult Children (Patients daughter [name])      Expected Discharge Plan and Services In-house Referral: Clinical Social Work     Living arrangements for the past 2 months:  (from [facility] Short term lives at home with her daughter and sister)                                      Prior Living Arrangements/Services Living arrangements for the past 2 months:  (from [facility] term lives at home with her daughter and sister) Lives with:: Adult Children, Siblings (from [place] short term lives at home with daughter and sister) Patient language and need for interpreter reviewed:: Yes Do you feel safe going back to the  place where you live?: No   SNF  Need for Family Participation in Patient Care: Yes (Comment) Care giver support system in place?: Yes (comment)   Criminal Activity/Legal Involvement Pertinent to Current Situation/Hospitalization: No - Comment as needed  Activities of Daily Living Home Assistive Devices/Equipment: Wheelchair, Environmental consultant (specify type) ADL Screening (condition at time of admission) Patient's cognitive ability adequate to safely complete daily activities?: Yes Is the patient deaf or have difficulty hearing?: No Does the patient have difficulty seeing, even when wearing glasses/contacts?: No Does the patient have difficulty concentrating, remembering, or making decisions?: No Patient able to express need for assistance with ADLs?: Yes Does the patient have difficulty dressing or bathing?: Yes Independently performs ADLs?: No Communication: Independent Dressing (OT): Needs assistance Is this a change from baseline?: Pre-admission baseline Grooming: Needs assistance Is this a change from baseline?: Pre-admission baseline Feeding: Needs assistance Is this a change from baseline?: Pre-admission baseline Bathing: Dependent Is this a change from baseline?: Pre-admission baseline Toileting: Dependent Is this a change from baseline?: Pre-admission baseline In/Out Bed: Needs assistance Is this a change from baseline?: Pre-admission baseline Walks in Home: Needs assistance Is this a change from baseline?: Pre-admission baseline Does the patient have difficulty walking or climbing stairs?: Yes Weakness of Legs: Both Weakness of Arms/Hands: Both  Permission Sought/Granted Permission sought to share information with : Case Manager, Family Supports, Magazine features editor Permission granted to share information with :  No  Share Information with NAME: due to patients current orientation CSw spoke with patients daughter [name]  Permission granted to share info w AGENCY: due  to patients current orientation CSW spoke with patients daughter Kitty/SNF  Permission granted to share info w Relationship: due to patients current orientation CSw spoke with patients daughter [name]  Permission granted to share info w Contact Information: due to patients current orientation CSw spoke with patients daughter [name] [phone]  Emotional Assessment       Orientation: : Oriented to Self, Oriented to Place Alcohol / Substance Use: Not Applicable Psych Involvement: No (comment)  Admission diagnosis:  Small bowel obstruction (HCC) [K56.609] SBO (small bowel obstruction) (HCC) [K56.609] Abnormal ECG [R94.31] Acute cystitis with hematuria [N30.01] Leukocytosis, unspecified type [D72.829] Aortic dissection (HCC) [I71.00] Patient Active Problem List   Diagnosis Date Noted   Abnormal ECG 02/20/2023   Demand ischemia 02/20/2023   Aortic dissection (HCC) 02/20/2023   Sepsis secondary to UTI (HCC) 02/19/2023   Urinary retention 02/19/2023   Hyponatremia 02/08/2023   AKI (acute kidney injury) (HCC) 02/08/2023   Leukocytosis 02/08/2023   Stroke (cerebrum) (HCC) 02/07/2023   Postoperative anemia due to acute blood loss    Subcapital fracture of hip (HCC) 04/09/2015   Hip fracture (HCC) 04/09/2015   Left displaced femoral neck fracture (HCC) 04/09/2015   Gastric ulceration    Upper GI bleed    UTI (lower urinary tract infection) 02/21/2015   Trichomonal infection 02/21/2015   Small bowel obstruction (HCC) 05/14/2013   Ventral hernia 06/03/2011   HTN (hypertension) 06/03/2011   Crohn's disease (HCC) 05/31/2011   Tobacco abuse 05/31/2011   Benign essential HTN 05/31/2011   PCP:  Assunta Found, MD Pharmacy:   [facility] [place] [place] - [address] [place] [address] Phone: [phone] Fax: [phone]     Social Determinants of Health (SDOH) Social History: SDOH Screenings   Food Insecurity: No Food Insecurity (02/20/2023)   Housing: Low Risk  (02/20/2023)  Transportation Needs: No Transportation Needs (02/20/2023)  Utilities: Not At Risk (02/20/2023)  Tobacco Use: High Risk (02/19/2023)   SDOH Interventions:     Readmission Risk Interventions     No data to display

## 2023-02-23 NOTE — Evaluation (Signed)
Occupational Therapy Evaluation Patient Details Name: [name] MRN: [id number] DOB: 1946-01-05 Today's Date: 02/23/2023   History of Present Illness 77 yo female [facility] from [place] for n/v x3 days. Workup for SBO. Recent admission 8/24-8/30 for subacute CVA, R ICA stenosis, AKI. PMH includes GIB, HTN, crohn's disease, CVA, SBO s/p hemicolectomy.   Clinical Impression   Patient with minimal participation with OT assessment.  Patient resisting movement and unable to sit at the EOB for any length of time.  Patient stating she wanted to be left alone, and wished to remain in the bed.  Unclear what her status may have been at the SNF, but assumed assist as needed.  OT will defer any potential OT screen to the next level of care.  OT discontinued in the acute setting.         If plan is discharge home, recommend the following: Assist for transportation;Assistance with feeding;Two people to help with walking and/or transfers;A lot of help with bathing/dressing/bathroom;Direct supervision/assist for financial management;Direct supervision/assist for medications management    Functional Status Assessment  Patient has had a recent decline in their functional status and demonstrates the ability to make significant improvements in function in a reasonable and predictable amount of time.  Equipment Recommendations  None recommended by OT    Recommendations for Other Services       Precautions / Restrictions Precautions Precautions: Fall Precaution Comments: low vision Restrictions Weight Bearing Restrictions: No      Mobility Bed Mobility Overal bed mobility: Needs Assistance Bed Mobility: Supine to Sit, Sit to Supine     Supine to sit: HOB elevated, Total assist, +2 for physical assistance Sit to supine: +2 for physical assistance, Total assist, HOB elevated        Transfers                          Balance Overall balance assessment: Needs  assistance Sitting-balance support: Feet supported, No upper extremity supported Sitting balance-Leahy Scale: Zero                                     ADL either performed or assessed with clinical judgement   ADL Overall ADL's : At baseline                                       General ADL Comments: Assist as needed at bedlevel     Vision Ability to See in Adequate Light: 1 Impaired Patient Visual Report: Blurring of vision       Perception Perception: Not tested       Praxis Praxis: Impaired Praxis Impairment Details: Initiation     Pertinent Vitals/Pain Pain Assessment Pain Assessment: Faces Faces Pain Scale: Hurts even more Pain Location: generalized with mobility Pain Descriptors / Indicators: Grimacing, Guarding Pain Intervention(s): Monitored during session     Extremity/Trunk Assessment Upper Extremity Assessment Upper Extremity Assessment: LUE deficits/detail LUE Deficits / Details: Able to grip washcloth with assist, no active movement at elbow, wrist, or shoulder observed, PROM WFL LUE Sensation: WNL LUE Coordination: decreased fine motor;decreased gross motor   Lower Extremity Assessment Lower Extremity Assessment: Defer to PT evaluation   Cervical / Trunk Assessment Cervical / Trunk Assessment: Kyphotic   Communication Communication Communication: No apparent  difficulties   Cognition Arousal: Lethargic Behavior During Therapy: Restless, Anxious, Agitated Overall Cognitive Status: History of cognitive impairments - at baseline                                       General Comments   VSS on RA    Exercises     Shoulder Instructions      Home Living Family/patient expects to be discharged to:: Skilled nursing facility                   Bathroom Shower/Tub: Walk-in shower   Bathroom Toilet: Handicapped height     Home Equipment: Agricultural consultant (2 wheels);Shower seat - built in;Cane  - single point;Wheelchair - manual          Prior Functioning/Environment Prior Level of Function : Needs assist       Physical Assist : ADLs (physical);Mobility (physical)     Mobility Comments: two weeks ago, documented pt is a household ambulator. Pt now states she is mostly bed-level, does get to w/c with assist from staff ADLs Comments: Assist for bathing, lower body dressing, and IADL's.        OT Problem List: Decreased strength;Decreased range of motion;Decreased activity tolerance;Impaired balance (sitting and/or standing);Impaired vision/perception;Decreased coordination;Decreased safety awareness;Impaired tone;Impaired UE functional use      OT Treatment/Interventions:      OT Goals(Current goals can be found in the care plan section) Acute Rehab OT Goals OT Goal Formulation: Patient unable to participate in goal setting Time For Goal Achievement: 03/09/23  OT Frequency:      Co-evaluation PT/OT/SLP Co-Evaluation/Treatment: Yes Reason for Co-Treatment: For patient/therapist safety;To address functional/ADL transfers PT goals addressed during session: Balance;Mobility/safety with mobility OT goals addressed during session: ADL's and self-care      AM-PAC OT "6 Clicks" Daily Activity     Outcome Measure Help from another person eating meals?: A Lot Help from another person taking care of personal grooming?: A Lot Help from another person toileting, which includes using toliet, bedpan, or urinal?: Total Help from another person bathing (including washing, rinsing, drying)?: Total Help from another person to put on and taking off regular upper body clothing?: A Lot Help from another person to put on and taking off regular lower body clothing?: Total 6 Click Score: 9   End of Session Nurse Communication: Mobility status  Activity Tolerance: Patient limited by pain Patient left: in bed;with call bell/phone within reach;with bed alarm set  OT Visit Diagnosis:  Other abnormalities of gait and mobility (R26.89);Muscle weakness (generalized) (M62.81);History of falling (Z91.81);Other symptoms and signs involving the nervous system (R29.898);Hemiplegia and hemiparesis;Unsteadiness on feet (R26.81) Hemiplegia - Right/Left: Left Hemiplegia - dominant/non-dominant: Non-Dominant Hemiplegia - caused by: Cerebral infarction                Time: 1045-1105 OT Time Calculation (min): 20 min Charges:  OT General Charges $OT Visit: 1 Visit OT Evaluation $OT Eval Moderate Complexity: 1 Mod  02/23/2023  RP, OTR/L  Acute Rehabilitation Services  Office:  [phone]   Suzanna Obey 02/23/2023, 11:35 AM

## 2023-02-23 NOTE — Progress Notes (Signed)
   Palliative Medicine Inpatient Follow Up Note HPI: 77 y.o. female  with past medical history of GI bleed, hypertension, Crohn's, recent stroke with left hemiparesis, small bowel obstruction admitted on 02/19/2023 with abdominal pain.    Recent admission to the hospital 8/24-8/30 for acute stroke and discharged to a skilled nursing home. She presents back to the emergency room from SNF with abdominal pain, distention and nausea vomiting over the last 3 days. CT scan abdomen pelvis showed small bowel obstruction with transition point in the lower posterior abdomen.  Patient also had abnormal EKG, possible GI bleed, sepsis secondary to UTI, AKI.   PMT has been consulted to assist with goals of care conversation  Today's Discussion 02/23/2023  *Please note that this is a verbal dictation therefore any spelling or grammatical errors are due to the "Dragon Medical One" system interpretation.  Chart reviewed inclusive of vital signs, progress notes, laboratory results, and diagnostic images.   I met with Dondra Spry this morning. She was awake and share that she is feeling like she needs to be repositioned. This was pursued. She denied pain, nausea, or shortness of breath. We discussed that she will likely need rehabilitation from here which she is in agreement with.   Questions and concerns addressed/Palliative Support Provided.   Objective Assessment: Vital Signs Vitals:   02/23/23 1021 02/23/23 1203  BP: (!) 142/79 (!) 144/82  Pulse: 75 81  Resp: 12 12  Temp: 98.3 F (36.8 C) 98 F (36.7 C)  SpO2: 95% 94%    Intake/Output Summary (Last 24 hours) at 02/23/2023 1453 Last data filed at 02/23/2023 0600 Gross per 24 hour  Intake 715.4 ml  Output 550 ml  Net 165.4 ml   Last Weight  Most recent update: 02/20/2023  4:50 AM    Weight  52.1 kg (114 lb 12.8 oz)            Gen:  Frail elderly caucasian F in NAD HEENT: moist mucous membranes CV: Regular rate and rhythm  PULM:  On RA, breathing is  even and non-labored ABD: soft/nontender  EXT: No edema  Neuro: Alert and oriented x3   SUMMARY OF RECOMMENDATIONS   DNAR/DNI  Continue to allow time for outcomes  Will need SNF placement for rehab  Will benefit on discharge from OP Palliative support  PMT will continue to follow along  Billing based on MDM: Low ______________________________________________________________________________________ [name] [place] Palliative Medicine Team Team Cell Phone: [phone] Please utilize secure chat with additional questions, if there is no response within 30 minutes please call the above phone number  Palliative Medicine Team providers are available by phone from 7am to 7pm daily and can be reached through the team cell phone.  Should this patient require assistance outside of these hours, please call the patient's attending physician.

## 2023-02-23 NOTE — Care Management Important Message (Signed)
Important Message  Patient Details  Name: [name] MRN: [id number] Date of Birth: [birth year]   Medicare Important Message Given:  Yes     Daouda Lonzo Stefan Church 02/23/2023, 3:11 PM

## 2023-02-23 NOTE — Care Management Important Message (Signed)
Important Message  Patient Details  Name: CHARVETTE KRENEK MRN: [id number] Date of Birth: 12/22/1945   Medicare Important Message Given:  Yes     Sherilyn Banker 02/23/2023, 1:51 PM

## 2023-02-23 NOTE — Plan of Care (Signed)
  Problem: Clinical Measurements: Goal: Respiratory complications will improve Outcome: Progressing Goal: Cardiovascular complication will be avoided Outcome: Progressing   Problem: Nutrition: Goal: Adequate nutrition will be maintained Outcome: Progressing   Problem: Elimination: Goal: Will not experience complications related to bowel motility Outcome: Progressing Goal: Will not experience complications related to urinary retention Outcome: Progressing   Problem: Pain Managment: Goal: General experience of comfort will improve Outcome: Progressing   Problem: Safety: Goal: Ability to remain free from injury will improve Outcome: Progressing   Problem: Skin Integrity: Goal: Risk for impaired skin integrity will decrease Outcome: Progressing

## 2023-02-24 DIAGNOSIS — Z7189 Other specified counseling: Secondary | ICD-10-CM | POA: Diagnosis not present

## 2023-02-24 DIAGNOSIS — K56609 Unspecified intestinal obstruction, unspecified as to partial versus complete obstruction: Secondary | ICD-10-CM | POA: Diagnosis not present

## 2023-02-24 DIAGNOSIS — Z515 Encounter for palliative care: Secondary | ICD-10-CM | POA: Diagnosis not present

## 2023-02-24 LAB — GLUCOSE, CAPILLARY
Glucose-Capillary: 78 mg/dL (ref 70–99)
Glucose-Capillary: 121 mg/dL — ABNORMAL HIGH (ref 70–99)
Glucose-Capillary: 115 mg/dL — ABNORMAL HIGH (ref 70–99)
Glucose-Capillary: 100 mg/dL — ABNORMAL HIGH (ref 70–99)

## 2023-02-24 LAB — BASIC METABOLIC PANEL
Anion gap: 11 (ref 5–15)
BUN: 9 mg/dL (ref 8–23)
CO2: 19 mmol/L — ABNORMAL LOW (ref 22–32)
Calcium: 8.2 mg/dL — ABNORMAL LOW (ref 8.9–10.3)
Chloride: 103 mmol/L (ref 98–111)
Creatinine, Ser: 0.72 mg/dL (ref 0.44–1.00)
GFR, Estimated: >60 mL/min (ref >60)
Glucose, Bld: 108 mg/dL — ABNORMAL HIGH (ref 70–99)
Potassium: 3.9 mmol/L (ref 3.5–5.1)
Sodium: 133 mmol/L — ABNORMAL LOW (ref 135–145)

## 2023-02-24 LAB — CBC
HCT: 39.7 % (ref 36.0–46.0)
Hemoglobin: 13.5 g/dL (ref 12.0–15.0)
MCH: 32.2 pg (ref 26.0–34.0)
MCHC: 34 g/dL (ref 30.0–36.0)
MCV: 94.7 fL (ref 80.0–100.0)
Platelets: 160 10*3/uL (ref 150–400)
RBC: 4.19 MIL/uL (ref 3.87–5.11)
RDW: 16.6 % — ABNORMAL HIGH (ref 11.5–15.5)
WBC: 8.2 10*3/uL (ref 4.0–10.5)
nRBC: 0 % (ref 0.0–0.2)

## 2023-02-24 MED ORDER — SODIUM CHLORIDE 0.9% FLUSH
10.0000 mL | Freq: Two times a day (BID) | INTRAVENOUS | Status: DC
  Administered 2023-02-24 – 2023-02-26 (×4): 10 mL

## 2023-02-24 MED ORDER — SODIUM CHLORIDE 0.9% FLUSH: 10.0000 mL | INTRAVENOUS | Status: DC | PRN

## 2023-02-24 MED ORDER — PANTOPRAZOLE SODIUM 40 MG PO TBEC
40.0000 mg | DELAYED_RELEASE_TABLET | Freq: Two times a day (BID) | ORAL | Status: DC
  Administered 2023-02-24 – 2023-02-26 (×5): 40 mg via ORAL
  Filled 2023-02-24 (×5): qty 1

## 2023-02-24 NOTE — Progress Notes (Signed)
Initial Nutrition Assessment  DOCUMENTATION CODES:   Non-severe (moderate) malnutrition in context of chronic illness, Underweight  INTERVENTION:  D/c Ensure Plus High Protein po BID, each supplement provides 350 kcal and 20 grams of protein.  Vanilla Magic cup BID with meals, each supplement provides 290 kcal and 9 grams of protein  Added patient to feeder list   NUTRITION DIAGNOSIS:   Moderate Malnutrition related to chronic illness as evidenced by moderate fat depletion, moderate muscle depletion.  GOAL:   Patient will meet greater than or equal to 90% of their needs  MONITOR:   PO intake, Weight trends, Supplement acceptance, Skin, I & O's, Labs  REASON FOR ASSESSMENT:   Malnutrition Screening Tool    ASSESSMENT:   77 y.o. female  with past medical history of GI bleed, hypertension, Crohn's, recent stroke with left hemiparesis, small bowel obstruction admitted for SBO  Visited patient at bedside who speaks with her eyes closed. No family at bedside to help provide hx. Patient reports she does not like the ensure or boost breeze at bedside. She reports liking vanilla ice cream, so RD to trial magic cups ONS. Patient's lunch was at bedside and she is unable to feed herself. RD added patient to feeder list.   RD reviewed patient's chart for further hx and noted that patient was admitted for partial SBO which surgery has since signed off. She had a suspected GIB but was not a good candidate for EGD. Patient is to dc with OP Palliative.   Labs: CBG 115, Na 133, Phos 2.1, last A1c 4.2 (02/08/23) Meds: protonix, NS, potassium phosphate  Wt:  02/20/23 52.1 kg  02/10/23 54.6 kg  01/24/23 49.4 kg  01/15/23 49.4 kg  12/03/21 56.8 kg   PO: 0-25% x last 4 documented meals   I/O's: -854 ml (net cumulative)   NUTRITION - FOCUSED PHYSICAL EXAM:  Flowsheet Row Most Recent Value  Orbital Region Moderate depletion  Upper Arm Region Unable to assess  Thoracic and Lumbar Region  Moderate depletion  Buccal Region Moderate depletion  Temple Region Moderate depletion  Clavicle Bone Region Severe depletion  Clavicle and Acromion Bone Region Severe depletion  Scapular Bone Region Unable to assess  Dorsal Hand Unable to assess  Patellar Region Moderate depletion  Anterior Thigh Region Moderate depletion  Posterior Calf Region Moderate depletion  Edema (RD Assessment) None  Hair Reviewed  Eyes Unable to assess  Mouth Unable to assess  Skin Reviewed  Nails Reviewed       Diet Order:   Diet Order             DIET SOFT Room service appropriate? Yes; Fluid consistency: Thin  Diet effective now                   EDUCATION NEEDS:   Not appropriate for education at this time  Skin:  Skin Assessment: Reviewed RN Assessment  Last BM:  9/9 type 6  Height:   Ht Readings from Last 1 Encounters:  02/19/23 5\' 5"  (1.651 m)    Weight:   Wt Readings from Last 1 Encounters:  02/20/23 52.1 kg    Ideal Body Weight:     BMI:  Body mass index is 19.1 kg/m.  Estimated Nutritional Needs:   Kcal:  1560-1820  Protein:  65-80 g  Fluid:  > 1.8 L  Leodis Rains, RDN, LDN  Clinical Nutrition

## 2023-02-24 NOTE — Progress Notes (Signed)
Palliative Medicine Inpatient Follow Up Note HPI: 77 y.o. female  with past medical history of GI bleed, hypertension, Crohn's, recent stroke with left hemiparesis, small bowel obstruction admitted on 02/19/2023 with abdominal pain.    Recent admission to the hospital 8/24-8/30 for acute stroke and discharged to a skilled nursing home. She presents back to the emergency room from SNF with abdominal pain, distention and nausea vomiting over the last 3 days. CT scan abdomen pelvis showed small bowel obstruction with transition point in the lower posterior abdomen.  Patient also had abnormal EKG, possible GI bleed, sepsis secondary to UTI, AKI.   PMT has been consulted to assist with goals of care conversation  Today's Discussion 02/24/2023  *Please note that this is a verbal dictation therefore any spelling or grammatical errors are due to the "Dragon Medical One" system interpretation.  Chart reviewed inclusive of vital signs, progress notes, laboratory results, and diagnostic images.   I met with [name] at bedside in the presence of her nurse technician this morning. I was able to provide support to the nurse technician and help in changing Deneka's linens. She was complaining of some pain in her hand and her L arm was far more swollen than the day prior. I share that I would alert the primary medical team.  We discussed that she will transition to rehabilitation from here in the oncoming days(s).  I called and spoke to patients daughter, [name]. She and I reviewed the significance of [name]'s recent hospitalization in the setting of her partial SBO and her sepsis from UTI. I shared my concerns in the setting of [name]'s profound weakness presently. We reviewed that this is in addition to her prior declined functional state from her stroke. We reviewed the chronic disease trajectory and when it may be reasonable to consider hospice care. I explained the differences between palliative and hospice support. At  this time patients daughter is in agreement with OP Palliative support on discharge.   Questions and concerns addressed/Palliative Support Provided.   Objective Assessment: Vital Signs Vitals:   02/24/23 0013 02/24/23 0356  BP: (!) 124/94 (!) 122/99  Pulse: (!) 103 (!) 111  Resp: 20 17  Temp: 98.3 F (36.8 C) 98.6 F (37 C)  SpO2: 95% 95%    Intake/Output Summary (Last 24 hours) at 02/24/2023 1325 Last data filed at 02/24/2023 0358 Gross per 24 hour  Intake 379.5 ml  Output 2700 ml  Net -2320.5 ml   Last Weight  Most recent update: 02/20/2023  4:50 AM    Weight  52.1 kg (114 lb 12.8 oz)            Gen:  Frail elderly caucasian F in NAD HEENT: moist mucous membranes CV: Regular rate and rhythm  PULM:  On RA, breathing is even and non-labored ABD: soft/nontender  EXT: LUE swollen, RUE w. ecchymosis Neuro: Alert and oriented x3   SUMMARY OF RECOMMENDATIONS   DNAR/DNI  Continue to allow time for outcomes  Open and honest conversations held with patients daughter in the setting of her declining health  Will need SNF placement for rehab  OP Palliative support on discharge  PMT will continue to follow along  Billing based on MDM: High ______________________________________________________________________________________ [name] [place] Palliative Medicine Team Team Cell Phone: [phone] Please utilize secure chat with additional questions, if there is no response within 30 minutes please call the above phone number  Palliative Medicine Team providers are available by phone from 7am to 7pm  daily and can be reached through the team cell phone.  Should this patient require assistance outside of these hours, please call the patient's attending physician.

## 2023-02-24 NOTE — Plan of Care (Signed)

## 2023-02-24 NOTE — TOC Progression Note (Addendum)
Transition of Care [facility]) - Progression Note    Patient Details  Name: [name] MRN: [id number] Date of Birth: Apr 16, 1946  Transition of Care [facility]) CM/SW Contact  Delilah Shan, LCSWA Phone Number: 02/24/2023, 1:51 PM  Clinical Narrative:     CSW started insurance authorization for patient Auth ID# [id number]. Insurance authorization currently pending. CSW spoke with facility. Facility informed CSW that can accept patient back with the iv med,that everything would have to go through one IV, no piggyback. CSW informed MD. Patient has SNF bed at [facility]. CSW received consult for patient. CSW spoke with patients daughter [name]. [name] gave CSW permission to make palliative referral to [facility] compassionate care. CSW LVM, awaiting call back from [place] compassionate care.CSW will continue to follow and assist with patients dc planning needs.  Expected Discharge Plan: Skilled Nursing Facility Barriers to Discharge: Continued Medical Work up  Expected Discharge Plan and Services In-house Referral: Clinical Social Work     Living arrangements for the past 2 months:  (from [facility] Short term lives at home with her daughter and sister)                                       Social Determinants of Health (SDOH) Interventions SDOH Screenings   Food Insecurity: No Food Insecurity (02/20/2023)  Housing: Low Risk  (02/20/2023)  Transportation Needs: No Transportation Needs (02/20/2023)  Utilities: Not At Risk (02/20/2023)  Tobacco Use: High Risk (02/19/2023)    Readmission Risk Interventions     No data to display

## 2023-02-24 NOTE — Progress Notes (Signed)
PROGRESS NOTE    [name]  ZDG:[id number] DOB: [birth year] DOA: 02/19/2023 PCP: Assunta Found, MD     Brief Narrative:  [name] is a 77 year old with history of previous GI bleed, hypertension, Crohn's disease, recent stroke with left hemiparesis, recent admission to the hospital 8/24-8/30 for acute stroke and discharged to a skilled nursing home.  She presents back to the emergency room from SNF with abdominal pain, distention and nausea vomiting over the last 3 days.  Also reported headache and back pain. At the emergency room normotensive.  Normothermic.  Heart rate 111-127.  Oxygen 93% on room air.  After given a dose of fentanyl, oxygen saturations dropped to 87%.  Leukocytosis with white blood cell count of 29,000.  Potassium 3.4.  Patient was started on IV fluid boluses, cultures were collected and is started on IV Zosyn.  She had 2200 mL urine, Foley was inserted.  CT scan abdomen pelvis showed small bowel obstruction with transition point in the lower posterior abdomen. Initial EKG suggested ST elevation, ruled out.  Patient was ultimately transferred to [facility] for higher level of care. Hospitalization further complicated by concern for GI bleed, UTI, Candida bacteremia.  New events last 24 hours / Subjective: Denies any abdominal complaints.  No acute events overnight.  Assessment & Plan:  Principal Problem:   Small bowel obstruction (HCC) Active Problems:   Sepsis secondary to UTI (HCC)   Urinary retention   Crohn's disease (HCC)   Upper GI bleed   HTN (hypertension)   Stroke (cerebrum) (HCC)   AKI (acute kidney injury) (HCC)   Abnormal ECG   Demand ischemia   Aortic dissection (HCC)   Fungemia   Partial small bowel obstruction due to adhesions: -General Surgery now signed off.  Tolerating diet.  Resolved.   Sepsis present on admission secondary to E. coli UTI:  -Completed Zosyn.   Candida bacteremia: -Plan for 2 weeks of micafungin.  Follow-up  outpatient ophthalmology to rule out endophthalmitis. Repeat blood cultures 9/8 are negative to date.  Midline ordered. OPAT in place    Abnormal EKG with ST elevation:  -Seen by cardiology.  Mildly elevated troponins, echo findings unremarkable.  Normal ejection fraction.  Patient with acute small bowel obstruction, recent stroke.  Symptomatic treatment.  Not a candidate for anticoagulation or left heart cath.   Acute GI bleeding:  -Suspect lower GI bleeding.  NG tube suction without evidence of bleeding. Patient is not a candidate for anesthesia endoscopy now. Started on PPI twice daily.   History of stroke:  -Aspirin and Plavix now on hold due to concern for GI bleeding    Acute urinary retention:  -Foley catheter in place   Goal of care: -Palliative care medicine following    DVT prophylaxis:  SCDs Start: 02/20/23 0338  Code Status: DNR Family Communication: Daughter over the phone  Disposition Plan: SNF Status is: Inpatient Remains inpatient appropriate because: Midline placement today.  Once insurance authorization ready.    Antimicrobials:  Anti-infectives (From admission, onward)    Start     Dose/Rate Route Frequency Ordered Stop   02/23/23 1400  fluconazole (DIFLUCAN) IVPB 400 mg  Status:  Discontinued       Placed in "Followed by" Linked Group   400 mg 100 mL/hr over 120 Minutes Intravenous Every 24 hours 02/22/23 1148 02/22/23 1149   02/22/23 1400  fluconazole (DIFLUCAN) IVPB 800 mg  Status:  Discontinued       Placed in "Followed by"  Linked Group   800 mg 200 mL/hr over 120 Minutes Intravenous  Once 02/22/23 1148 02/22/23 1149   02/22/23 1400  micafungin (MYCAMINE) 100 mg in sodium chloride 0.9 % 100 mL IVPB        100 mg 105 mL/hr over 1 Hours Intravenous Every 24 hours 02/22/23 1204     02/19/23 2200  piperacillin-tazobactam (ZOSYN) IVPB 3.375 g  Status:  Discontinued        3.375 g 12.5 mL/hr over 240 Minutes Intravenous Every 8 hours 02/19/23 1450  02/23/23 1233   02/19/23 1445  piperacillin-tazobactam (ZOSYN) IVPB 3.375 g        3.375 g 100 mL/hr over 30 Minutes Intravenous  Once 02/19/23 1433 02/19/23 1630        Objective: Vitals:   02/23/23 1610 02/23/23 1943 02/24/23 0013 02/24/23 0356  BP: (!) 159/86 (!) 145/98 (!) 124/94 (!) 122/99  Pulse: 84 89 (!) 103 (!) 111  Resp: 15 17 20 17   Temp: (!) 97.4 F (36.3 C) 97.6 F (36.4 C) 98.3 F (36.8 C) 98.6 F (37 C)  TempSrc: Axillary Oral Oral Axillary  SpO2: 95% 96% 95% 95%  Weight:      Height:        Intake/Output Summary (Last 24 hours) at 02/24/2023 1224 Last data filed at 02/24/2023 0358 Gross per 24 hour  Intake 409.5 ml  Output 2700 ml  Net -2290.5 ml   Filed Weights   02/19/23 1403 02/20/23 0318  Weight: 54.6 kg 52.1 kg    Examination:  General exam: Appears calm and comfortable , chronically ill-appearing Respiratory system: Clear to auscultation. Respiratory effort normal. No respiratory distress. No conversational dyspnea.  Cardiovascular system: S1 & S2 heard, tachycardic. No murmurs. No pedal edema. Gastrointestinal system: Abdomen is nondistended, soft and nontender. Normal bowel sounds heard.   Data Reviewed: I have personally reviewed following labs and imaging studies  CBC: Recent Labs  Lab 02/19/23 1415 02/19/23 1424 02/20/23 0642 02/21/23 0414 02/22/23 0446 02/23/23 0434 02/24/23 0711  WBC 29.2*  --  20.2* 11.2* 9.9 10.1 8.2  NEUTROABS 26.8*  --   --  9.4* 7.8* 7.7  --   HGB 9.5*   < > 7.6* 6.8* 12.4 12.2 13.5  HCT 28.1*   < > 23.9* 22.6* 37.5 36.3 39.7  MCV 102.9*  --  108.1* 109.7* 94.5 92.4 94.7  PLT 343  --  220 214 169 155 160   < > = values in this interval not displayed.   Basic Metabolic Panel: Recent Labs  Lab 02/20/23 0642 02/21/23 0414 02/22/23 0446 02/23/23 0434 02/24/23 0711  NA 141 143 142 133* 133*  K 2.6* 2.9* 3.7 3.5 3.9  CL 106 112* 111 102 103  CO2 24 24 24 24  19*  GLUCOSE 126* 140* 113* 87 108*  BUN  36* 20 13 12 9   CREATININE 1.02* 0.77 0.83 0.85 0.72  CALCIUM 8.5* 8.0* 8.1* 7.8* 8.2*  MG  --  1.7 1.6* 1.9  --   PHOS  --  1.5*  --  2.1*  --    GFR: Estimated Creatinine Clearance: 47.7 mL/min (by C-G formula based on SCr of 0.72 mg/dL). Liver Function Tests: Recent Labs  Lab 02/19/23 1415 02/22/23 0446 02/23/23 0434  AST 29 25 28   ALT 23 17 18   ALKPHOS 49 31* 38  BILITOT 1.0 0.4 0.9  PROT 6.2* 4.5* 4.6*  ALBUMIN 3.4* 2.0* 2.1*   Recent Labs  Lab 02/19/23 1415  LIPASE 64*  No results for input(s): "AMMONIA" in the last 168 hours. Coagulation Profile: No results for input(s): "INR", "PROTIME" in the last 168 hours. Cardiac Enzymes: No results for input(s): "CKTOTAL", "CKMB", "CKMBINDEX", "TROPONINI" in the last 168 hours. BNP (last 3 results) No results for input(s): "PROBNP" in the last 8760 hours. HbA1C: No results for input(s): "HGBA1C" in the last 72 hours. CBG: Recent Labs  Lab 02/23/23 0005 02/23/23 0748 02/23/23 1609 02/24/23 0118 02/24/23 0810  GLUCAP 110* 88 82 78 121*   Lipid Profile: No results for input(s): "CHOL", "HDL", "LDLCALC", "TRIG", "CHOLHDL", "LDLDIRECT" in the last 72 hours. Thyroid Function Tests: No results for input(s): "TSH", "T4TOTAL", "FREET4", "T3FREE", "THYROIDAB" in the last 72 hours. Anemia Panel: No results for input(s): "VITAMINB12", "FOLATE", "FERRITIN", "TIBC", "IRON", "RETICCTPCT" in the last 72 hours. Sepsis Labs: Recent Labs  Lab 02/19/23 2139 02/20/23 0149  LATICACIDVEN 1.3 1.1    Recent Results (from the past 240 hour(s))  SARS Coronavirus 2 by RT PCR (hospital order, performed in [facility] hospital lab) *cepheid single result test* Anterior Nasal Swab     Status: None   Collection Time: 02/19/23  2:13 PM   Specimen: Anterior Nasal Swab  Result Value Ref Range Status   SARS Coronavirus 2 by RT PCR NEGATIVE NEGATIVE Final    Comment: (NOTE) SARS-CoV-2 target nucleic acids are NOT DETECTED.  The SARS-CoV-2  RNA is generally detectable in upper and lower respiratory specimens during the acute phase of infection. The lowest concentration of SARS-CoV-2 viral copies this assay can detect is 250 copies / mL. A negative result does not preclude SARS-CoV-2 infection and should not be used as the sole basis for treatment or other patient management decisions.  A negative result may occur with improper specimen collection / handling, submission of specimen other than nasopharyngeal swab, presence of viral mutation(s) within the areas targeted by this assay, and inadequate number of viral copies (<250 copies / mL). A negative result must be combined with clinical observations, patient history, and epidemiological information.  Fact Sheet for Patients:   RoadLapTop.co.za  Fact Sheet for Healthcare Providers: http://kim-miller.com/  This test is not yet approved or  cleared by the [place] FDA and has been authorized for detection and/or diagnosis of SARS-CoV-2 by FDA under an Emergency Use Authorization (EUA).  This EUA will remain in effect (meaning this test can be used) for the duration of the COVID-19 declaration under Section 564(b)(1) of the Act, 21 U.S.C. section 360bbb-3(b)(1), unless the authorization is terminated or revoked sooner.  Performed at [facility], [address], [place], [place] [address]   Culture, blood (Routine X 2) w Reflex to ID Panel     Status: None (Preliminary result)   Collection Time: 02/19/23  9:30 PM   Specimen: BLOOD  Result Value Ref Range Status   Specimen Description BLOOD BLOOD RIGHT HAND  Final   Special Requests   Final    BOTTLES DRAWN AEROBIC ONLY Blood Culture results may not be optimal due to an inadequate volume of blood received in culture bottles   Culture   Final    NO GROWTH 4 DAYS Performed at [facility], [address] [place], [place] [address]    Report Status PENDING  Incomplete   Urine Culture (for pregnant, neutropenic or urologic patients or patients with an indwelling urinary catheter)     Status: Abnormal   Collection Time: 02/19/23  9:30 PM   Specimen: Urine, Catheterized  Result Value Ref Range Status  Specimen Description   Final    URINE, CATHETERIZED Performed at [facility], [address] [place], [place] [address]    Special Requests   Final    NONE Performed at [facility], [address] [place], [place] [address]    Culture >=100,000 COLONIES/mL ESCHERICHIA COLI (A)  Final   Report Status 02/22/2023 FINAL  Final   Organism ID, Bacteria ESCHERICHIA COLI (A)  Final      Susceptibility   Escherichia coli - MIC*    AMPICILLIN >=32 RESISTANT Resistant     CEFAZOLIN <=4 SENSITIVE Sensitive     CEFEPIME <=0.12 SENSITIVE Sensitive     CEFTRIAXONE <=0.25 SENSITIVE Sensitive     CIPROFLOXACIN >=4 RESISTANT Resistant     GENTAMICIN <=1 SENSITIVE Sensitive     IMIPENEM <=0.25 SENSITIVE Sensitive     NITROFURANTOIN <=16 SENSITIVE Sensitive     TRIMETH/SULFA <=20 SENSITIVE Sensitive     AMPICILLIN/SULBACTAM 8 SENSITIVE Sensitive     PIP/TAZO <=4 SENSITIVE Sensitive     * >=100,000 COLONIES/mL ESCHERICHIA COLI  Culture, blood (Routine X 2) w Reflex to ID Panel     Status: Abnormal (Preliminary result)   Collection Time: 02/19/23 11:19 PM   Specimen: BLOOD  Result Value Ref Range Status   Specimen Description   Final    BLOOD LEFT ANTECUBITAL Performed at [facility], [address], [place], [place] [address]    Special Requests   Final    BOTTLES DRAWN AEROBIC AND ANAEROBIC Blood Culture results may not be optimal due to an excessive volume of blood received in culture bottles Performed at [facility], [address], [place], [place] [address]    Culture  Setup Time   Final    YEAST AEROBIC BOTTLE ONLY Gram Stain Report Called to,Read Back By and Verified With: L WALKER AT [facility] AT 0925 ON [id number] BY S [place] CRITICAL RESULT CALLED TO, READ  BACK BY AND VERIFIED WITH: PHARMD JESSICA MILLEN [id number] AT 1414 BY EC Performed at [facility], [address] [place], [place] [address]    Culture CANDIDA GLABRATA (A)  Final   Report Status PENDING  Incomplete  Blood Culture ID Panel (Reflexed)     Status: Abnormal   Collection Time: 02/19/23 11:19 PM  Result Value Ref Range Status   Enterococcus faecalis NOT DETECTED NOT DETECTED Final   Enterococcus Faecium NOT DETECTED NOT DETECTED Final   Listeria monocytogenes NOT DETECTED NOT DETECTED Final   Staphylococcus species NOT DETECTED NOT DETECTED Final   Staphylococcus aureus (BCID) NOT DETECTED NOT DETECTED Final   Staphylococcus epidermidis NOT DETECTED NOT DETECTED Final   Staphylococcus lugdunensis NOT DETECTED NOT DETECTED Final   Streptococcus species NOT DETECTED NOT DETECTED Final   Streptococcus agalactiae NOT DETECTED NOT DETECTED Final   Streptococcus pneumoniae NOT DETECTED NOT DETECTED Final   Streptococcus pyogenes NOT DETECTED NOT DETECTED Final   A.calcoaceticus-baumannii NOT DETECTED NOT DETECTED Final   Bacteroides fragilis NOT DETECTED NOT DETECTED Final   Enterobacterales NOT DETECTED NOT DETECTED Final   Enterobacter cloacae complex NOT DETECTED NOT DETECTED Final   Escherichia coli NOT DETECTED NOT DETECTED Final   Klebsiella aerogenes NOT DETECTED NOT DETECTED Final   Klebsiella oxytoca NOT DETECTED NOT DETECTED Final   Klebsiella pneumoniae NOT DETECTED NOT DETECTED Final   Proteus species NOT DETECTED NOT DETECTED Final   Salmonella species NOT DETECTED NOT DETECTED Final   Serratia marcescens NOT DETECTED NOT DETECTED Final   Haemophilus influenzae NOT  DETECTED NOT DETECTED Final   Neisseria meningitidis NOT DETECTED NOT DETECTED Final   Pseudomonas aeruginosa NOT DETECTED NOT DETECTED Final   Stenotrophomonas maltophilia NOT DETECTED NOT DETECTED Final   Candida albicans NOT DETECTED NOT DETECTED Final   Candida auris NOT DETECTED NOT  DETECTED Final   Candida glabrata DETECTED (A) NOT DETECTED Final    Comment: CRITICAL RESULT CALLED TO, READ BACK BY AND VERIFIED WITH: PHARMD JESSICA MILLEN [id number] AT 1414 BY EC    Candida krusei NOT DETECTED NOT DETECTED Final   Candida parapsilosis NOT DETECTED NOT DETECTED Final   Candida tropicalis NOT DETECTED NOT DETECTED Final   Cryptococcus neoformans/gattii NOT DETECTED NOT DETECTED Final    Comment: Performed at [facility], [address], [place], [place] [address]  Culture, blood (Routine X 2) w Reflex to ID Panel     Status: None (Preliminary result)   Collection Time: 02/22/23 11:10 AM   Specimen: BLOOD LEFT ARM  Result Value Ref Range Status   Specimen Description BLOOD LEFT ARM  Final   Special Requests   Final    BOTTLES DRAWN AEROBIC AND ANAEROBIC Blood Culture adequate volume   Culture   Final    NO GROWTH 2 DAYS Performed at [facility], [address], [place], [place] [address]    Report Status PENDING  Incomplete  Culture, blood (Routine X 2) w Reflex to ID Panel     Status: None (Preliminary result)   Collection Time: 02/22/23 11:21 AM   Specimen: BLOOD RIGHT HAND  Result Value Ref Range Status   Specimen Description BLOOD RIGHT HAND  Final   Special Requests   Final    BOTTLES DRAWN AEROBIC ONLY Blood Culture results may not be optimal due to an inadequate volume of blood received in culture bottles   Culture   Final    NO GROWTH 2 DAYS Performed at [facility], [address], [place], [place] [address]    Report Status PENDING  Incomplete      Radiology Studies: No results found.    Scheduled Meds:  Chlorhexidine Gluconate Cloth  6 each Topical Daily   feeding supplement  237 mL Oral BID BM   pantoprazole (PROTONIX) IV  40 mg Intravenous Q12H   Continuous Infusions:  sodium chloride Stopped (02/20/23 1319)   micafungin (MYCAMINE) 100 mg in sodium chloride 0.9 % 100 mL IVPB 100 mg (02/23/23 1422)     LOS: 4 days    Time spent: 25 minutes   Noralee Stain, DO [facility] 02/24/2023, 12:24 PM   Available via Epic secure chat 7am-7pm After these hours, please refer to coverage provider listed on [email or web address]

## 2023-02-25 ENCOUNTER — Inpatient Hospital Stay (HOSPITAL_COMMUNITY): Payer: Medicare Other

## 2023-02-25 DIAGNOSIS — Z7189 Other specified counseling: Secondary | ICD-10-CM | POA: Diagnosis not present

## 2023-02-25 DIAGNOSIS — K5000 Crohn's disease of small intestine without complications: Secondary | ICD-10-CM

## 2023-02-25 DIAGNOSIS — A419 Sepsis, unspecified organism: Secondary | ICD-10-CM | POA: Diagnosis not present

## 2023-02-25 DIAGNOSIS — Z515 Encounter for palliative care: Secondary | ICD-10-CM | POA: Diagnosis not present

## 2023-02-25 DIAGNOSIS — R609 Edema, unspecified: Secondary | ICD-10-CM | POA: Diagnosis not present

## 2023-02-25 DIAGNOSIS — K922 Gastrointestinal hemorrhage, unspecified: Secondary | ICD-10-CM

## 2023-02-25 DIAGNOSIS — B49 Unspecified mycosis: Secondary | ICD-10-CM

## 2023-02-25 LAB — RENAL FUNCTION PANEL
Albumin: 2.3 g/dL — ABNORMAL LOW (ref 3.5–5.0)
Anion gap: 10 (ref 5–15)
BUN: 10 mg/dL (ref 8–23)
CO2: 22 mmol/L (ref 22–32)
Calcium: 8.5 mg/dL — ABNORMAL LOW (ref 8.9–10.3)
Chloride: 103 mmol/L (ref 98–111)
Creatinine, Ser: 0.85 mg/dL (ref 0.44–1.00)
GFR, Estimated: >60 mL/min (ref >60)
Glucose, Bld: 102 mg/dL — ABNORMAL HIGH (ref 70–99)
Phosphorus: 3.3 mg/dL (ref 2.5–4.6)
Potassium: 4 mmol/L (ref 3.5–5.1)
Sodium: 135 mmol/L (ref 135–145)

## 2023-02-25 LAB — GLUCOSE, CAPILLARY
Glucose-Capillary: 102 mg/dL — ABNORMAL HIGH (ref 70–99)
Glucose-Capillary: 88 mg/dL (ref 70–99)

## 2023-02-25 LAB — CULTURE, BLOOD (ROUTINE X 2): Culture: NO GROWTH

## 2023-02-25 MED ORDER — CARMEX CLASSIC LIP BALM EX OINT
1.0000 | TOPICAL_OINTMENT | CUTANEOUS | Status: DC | PRN
  Filled 2023-02-25: qty 10

## 2023-02-25 MED ORDER — ENSURE ENLIVE PO LIQD
237.0000 mL | Freq: Two times a day (BID) | ORAL | Status: DC
  Administered 2023-02-25 (×2): 237 mL via ORAL

## 2023-02-25 MED ORDER — POLYETHYLENE GLYCOL 3350 17 G PO PACK
17.0000 g | PACK | Freq: Every day | ORAL | Status: DC
  Administered 2023-02-25 – 2023-02-26 (×2): 17 g via ORAL
  Filled 2023-02-25 (×2): qty 1

## 2023-02-25 NOTE — Progress Notes (Signed)
Upper extremity venous duplex completed. Please see CV Procedures for preliminary results.  Shona Simpson, RVT 02/25/23 2:43 PM

## 2023-02-25 NOTE — Progress Notes (Signed)
[facility]  PROGRESS NOTE  [name] WUJ:[id number] DOB: 1946-03-23 DOA: 02/19/2023 PCP: Assunta Found, MD   Brief HPI:    77 year old with history of previous GI bleed, hypertension, Crohn's disease, recent stroke with left hemiparesis, recent admission to the hospital 8/24-8/30 for acute stroke and discharged to a skilled nursing home.  She presents back to the emergency room from SNF with abdominal pain, distention and nausea vomiting over the last 3 days.  Also reported headache and back pain. .  Leukocytosis with white blood cell count of 29,000.  Potassium 3.4.  Patient was started on IV fluid boluses, cultures were collected and is started on IV Zosyn.    CT scan abdomen pelvis showed small bowel obstruction with transition point in the lower posterior abdomen. Initial EKG suggested ST elevation, ruled out.  Patient was ultimately transferred to [facility] for higher level of care. Hospitalization further complicated by concern for GI bleed, UTI, Candida bacteremia.    Assessment/Plan:    Partial small bowel obstruction due to adhesions -Resolved -General Surgery was consulted; patient moving her bowels -Tolerating diet, general surgery has signed off    Sepsis present on admission secondary to E. coli UTI:  -Completed treatment with Zosyn   Candida bacteremia: -Plan for 2 weeks of micafungin.   Follow-up outpatient ophthalmology to rule out endophthalmitis.  Repeat blood cultures 9/8 are negative to date.   Midline ordered. OPAT in place    Abnormal EKG with ST elevation:  -Seen by cardiology.  Mildly elevated troponins, echo findings unremarkable.   -Normal ejection fraction.   -Patient with acute small bowel obstruction, recent stroke.  - Symptomatic treatment.   -Not a candidate for anticoagulation or left heart cath.   Acute GI bleeding:  -Suspect lower GI bleeding.   -NG tube suction without evidence of bleeding.  -Patient is not a candidate for  anesthesia endoscopy now.  -Started on PPI twice daily.    History of stroke:  -Aspirin and Plavix now on hold due to concern for GI bleeding    Acute urinary retention:  -Foley catheter removed   Goals of care: -Palliative care medicine consulted -Patient is DNR/DNI   Medications     Chlorhexidine Gluconate Cloth  6 each Topical Daily   feeding supplement  237 mL Oral BID BM   pantoprazole  40 mg Oral BID AC   sodium chloride flush  10-40 mL Intracatheter Q12H     Data Reviewed:   CBG:  Recent Labs  Lab 02/24/23 0118 02/24/23 0810 02/24/23 1518 02/24/23 2322 02/25/23 0757  GLUCAP 78 121* 115* 100* 102*    SpO2: 96 % O2 Flow Rate (L/min): 3 L/min    Vitals:   02/24/23 1500 02/24/23 1519 02/24/23 1931 02/25/23 0342  BP: 126/83 133/82 (!) 157/90 (!) 134/95  Pulse: 99 99 100 (!) 104  Resp: (!) 23 20 20 17   Temp:  98.7 F (37.1 C) 99 F (37.2 C) 99 F (37.2 C)  TempSrc:  Axillary Oral Axillary  SpO2: 96% 97% 95% 96%  Weight:      Height:          Data Reviewed:  Basic Metabolic Panel: Recent Labs  Lab 02/20/23 0642 02/21/23 0414 02/22/23 0446 02/23/23 0434 02/24/23 0711  NA 141 143 142 133* 133*  K 2.6* 2.9* 3.7 3.5 3.9  CL 106 112* 111 102 103  CO2 24 24 24 24  19*  GLUCOSE 126* 140* 113* 87 108*  BUN 36* 20  13 12 9   CREATININE 1.02* 0.77 0.83 0.85 0.72  CALCIUM 8.5* 8.0* 8.1* 7.8* 8.2*  MG  --  1.7 1.6* 1.9  --   PHOS  --  1.5*  --  2.1*  --     CBC: Recent Labs  Lab 02/19/23 1415 02/19/23 1424 02/20/23 0642 02/21/23 0414 02/22/23 0446 02/23/23 0434 02/24/23 0711  WBC 29.2*  --  20.2* 11.2* 9.9 10.1 8.2  NEUTROABS 26.8*  --   --  9.4* 7.8* 7.7  --   HGB 9.5*   < > 7.6* 6.8* 12.4 12.2 13.5  HCT 28.1*   < > 23.9* 22.6* 37.5 36.3 39.7  MCV 102.9*  --  108.1* 109.7* 94.5 92.4 94.7  PLT 343  --  220 214 169 155 160   < > = values in this interval not displayed.    LFT Recent Labs  Lab 02/19/23 1415 02/22/23 0446  02/23/23 0434  AST 29 25 28   ALT 23 17 18   ALKPHOS 49 31* 38  BILITOT 1.0 0.4 0.9  PROT 6.2* 4.5* 4.6*  ALBUMIN 3.4* 2.0* 2.1*     Antibiotics: Anti-infectives (From admission, onward)    Start     Dose/Rate Route Frequency Ordered Stop   02/23/23 1400  fluconazole (DIFLUCAN) IVPB 400 mg  Status:  Discontinued       Placed in "Followed by" Linked Group   400 mg 100 mL/hr over 120 Minutes Intravenous Every 24 hours 02/22/23 1148 02/22/23 1149   02/22/23 1400  fluconazole (DIFLUCAN) IVPB 800 mg  Status:  Discontinued       Placed in "Followed by" Linked Group   800 mg 200 mL/hr over 120 Minutes Intravenous  Once 02/22/23 1148 02/22/23 1149   02/22/23 1400  micafungin (MYCAMINE) 100 mg in sodium chloride 0.9 % 100 mL IVPB        100 mg 105 mL/hr over 1 Hours Intravenous Every 24 hours 02/22/23 1204     02/19/23 2200  piperacillin-tazobactam (ZOSYN) IVPB 3.375 g  Status:  Discontinued        3.375 g 12.5 mL/hr over 240 Minutes Intravenous Every 8 hours 02/19/23 1450 02/23/23 1233   02/19/23 1445  piperacillin-tazobactam (ZOSYN) IVPB 3.375 g        3.375 g 100 mL/hr over 30 Minutes Intravenous  Once 02/19/23 1433 02/19/23 1630        DVT prophylaxis: SCDs  Code Status: DNR  Family Communication: No family at bedside   CONSULTS    Subjective   Denies abdominal pain.  Foley catheter removed today   Objective    Physical Examination:   General-appears in no acute distress Heart-S1-S2, regular, no murmur auscultated Lungs-clear to auscultation bilaterally, no wheezing or crackles auscultated Abdomen-soft, nontender, no organomegaly Extremities-no edema in the lower extremities Neuro-alert, oriented x3, no focal deficit noted  Status is: Inpatient:             [name]   [facility] If 7PM-7AM, please contact night-coverage at [email or web address], Office  [phone]   02/25/2023, 9:59 AM  LOS: 5 days

## 2023-02-25 NOTE — Progress Notes (Signed)
   Palliative Medicine Inpatient Follow Up Note HPI: 77 y.o. female  with past medical history of GI bleed, hypertension, Crohn's, recent stroke with left hemiparesis, small bowel obstruction admitted on 02/19/2023 with abdominal pain.    Recent admission to the hospital 8/24-8/30 for acute stroke and discharged to a skilled nursing home. She presents back to the emergency room from SNF with abdominal pain, distention and nausea vomiting over the last 3 days. CT scan abdomen pelvis showed small bowel obstruction with transition point in the lower posterior abdomen.  Patient also had abnormal EKG, possible GI bleed, sepsis secondary to UTI, AKI.   PMT has been consulted to assist with goals of care conversation  Today's Discussion 02/25/2023  *Please note that this is a verbal dictation therefore any spelling or grammatical errors are due to the "Dragon Medical One" system interpretation.  Chart reviewed inclusive of vital signs, progress notes, laboratory results, and diagnostic images.   I met with [name] at bedside this morning. She is awake and oriented. She shares that she is not feeling comfortable, when asked where she refers to her stomach. I was able to palpate though she said it was quite painful. I shared that I would inform her primary team. Regarding [name]'s L arm swelling it has improved with elevation.   I told [name] that we are still awaiting her insurance for SNF approval.   Questions and concerns addressed/Palliative Support Provided.   Objective Assessment: Vital Signs Vitals:   02/24/23 1931 02/25/23 0342  BP: (!) 157/90 (!) 134/95  Pulse: 100 (!) 104  Resp: 20 17  Temp: 99 F (37.2 C) 99 F (37.2 C)  SpO2: 95% 96%    Intake/Output Summary (Last 24 hours) at 02/25/2023 1139 Last data filed at 02/25/2023 0345 Gross per 24 hour  Intake 130 ml  Output 450 ml  Net -320 ml   Last Weight  Most recent update: 02/20/2023  4:50 AM    Weight  52.1 kg (114 lb 12.8 oz)             Gen:  Frail elderly caucasian F in NAD HEENT: moist mucous membranes CV: Regular rate and rhythm  PULM:  On RA, breathing is even and non-labored ABD: distended, tender to palpation EXT: LUE swollen, RUE w. ecchymosis Neuro: Alert and oriented x3   SUMMARY OF RECOMMENDATIONS   DNAR/DNI  Continue to allow time for outcomes  Have informed Dr. Sharl Ma of abdominal tenderness  Continue elevation to LUE  Plan for SNF placement -->OP Palliative support on discharge  PMT will peripherally follow along  Billing based on MDM: Moderate  ______________________________________________________________________________________ [name] [place] Palliative Medicine Team Team Cell Phone: [phone] Please utilize secure chat with additional questions, if there is no response within 30 minutes please call the above phone number  Palliative Medicine Team providers are available by phone from 7am to 7pm daily and can be reached through the team cell phone.  Should this patient require assistance outside of these hours, please call the patient's attending physician.

## 2023-02-25 NOTE — Progress Notes (Signed)

## 2023-02-25 NOTE — Consult Note (Signed)
   [facility]  [facility] Inpatient Consult   02/25/2023  [name] 06-18-1945 [id number]  [facility] [[facility]]  Accountable Care Organization [ACO] Patient: [name] Medicare   Primary Care Provider:  Assunta Found, MD with [facility]   Patient was reviewed for less than 7 days readmission with medium risk score risk score with a 5 day length of stay for barriers to post hospital care needs.  Patient was screened for hospitalization with a recent ischemic stroke and with work up for small bowell obstruction s/p hemicolectomy.  Reviewed and on behalf of [facility] Care Coordination to assess for post hospital community care needs.  Patient is being considered for a skilled nursing facility level of care for post hospital transition. Met with patient at the bedside, and she is awaiting for a SNF transition.  She responds but keeps her eyes closed.  Noted Palliative Care consult as well.  If the patient goes to a [facility] affiliated facility then, patient can be followed by [facility] RN with traditional Medicare and approved Medicare Advantage plans. Currently, patient is noted for a non-[facility] affiliated facility.   Plan:   Will sign off, if goes to non-[facility] affiliated facility as needs are to be met for post hospital care at a ST- SNF rehab level of care.  For questions or referrals, please contact:   Charlesetta Shanks, RN BSN CCM [facility]  [phone] business mobile phone Toll free office [phone]  Fax number: [phone] [email or web address]

## 2023-02-25 NOTE — TOC Progression Note (Addendum)
Transition of Care [facility]) - Progression Note    Patient Details  Name: [name] MRN: [id number] Date of Birth: June 18, 1945  Transition of Care [facility]) CM/SW Contact  Delilah Shan, LCSWA Phone Number: 02/25/2023, 11:03 AM  Clinical Narrative:     Update-11:45am- Patients insurance authorization approved. Plan Auth ID# [id number]. Insurance authorization approved from 9/11-9/13. [name] with [facility] confirmed patient can dc over today if medically ready. CSW informed MD. MD informed CSW not medically ready today. CSW informed [name] with [facility].  Patient has SNF bed at [facility]. Insurance authorization currently pending. Patients daughter gave permission for CSW to make referral to [facility] for palliative services to follow patient at SNF. CSW spoke with Rae Halsted with [facility] and made referral for palliative services to follow patient at SNF. CSW will continue to follow and assist with patients dc planning needs.  Expected Discharge Plan: Skilled Nursing Facility Barriers to Discharge: Continued Medical Work up  Expected Discharge Plan and Services In-house Referral: Clinical Social Work     Living arrangements for the past 2 months:  (from [facility] Short term lives at home with her daughter and sister)                                       Social Determinants of Health (SDOH) Interventions SDOH Screenings   Food Insecurity: No Food Insecurity (02/20/2023)  Housing: Low Risk  (02/20/2023)  Transportation Needs: No Transportation Needs (02/20/2023)  Utilities: Not At Risk (02/20/2023)  Tobacco Use: High Risk (02/19/2023)    Readmission Risk Interventions     No data to display

## 2023-02-26 DIAGNOSIS — F17210 Nicotine dependence, cigarettes, uncomplicated: Secondary | ICD-10-CM | POA: Diagnosis not present

## 2023-02-26 DIAGNOSIS — E871 Hypo-osmolality and hyponatremia: Secondary | ICD-10-CM | POA: Diagnosis not present

## 2023-02-26 DIAGNOSIS — I639 Cerebral infarction, unspecified: Secondary | ICD-10-CM | POA: Diagnosis not present

## 2023-02-26 DIAGNOSIS — Z1619 Resistance to other specified beta lactam antibiotics: Secondary | ICD-10-CM | POA: Diagnosis not present

## 2023-02-26 DIAGNOSIS — R279 Unspecified lack of coordination: Secondary | ICD-10-CM | POA: Diagnosis not present

## 2023-02-26 DIAGNOSIS — R109 Unspecified abdominal pain: Secondary | ICD-10-CM | POA: Diagnosis not present

## 2023-02-26 DIAGNOSIS — T83511A Infection and inflammatory reaction due to indwelling urethral catheter, initial encounter: Secondary | ICD-10-CM | POA: Diagnosis not present

## 2023-02-26 DIAGNOSIS — Z515 Encounter for palliative care: Secondary | ICD-10-CM | POA: Diagnosis not present

## 2023-02-26 DIAGNOSIS — D72829 Elevated white blood cell count, unspecified: Secondary | ICD-10-CM | POA: Diagnosis not present

## 2023-02-26 DIAGNOSIS — R1311 Dysphagia, oral phase: Secondary | ICD-10-CM | POA: Diagnosis not present

## 2023-02-26 DIAGNOSIS — K649 Unspecified hemorrhoids: Secondary | ICD-10-CM | POA: Diagnosis not present

## 2023-02-26 DIAGNOSIS — Y731 Therapeutic (nonsurgical) and rehabilitative gastroenterology and urology devices associated with adverse incidents: Secondary | ICD-10-CM | POA: Diagnosis present

## 2023-02-26 DIAGNOSIS — K51919 Ulcerative colitis, unspecified with unspecified complications: Secondary | ICD-10-CM | POA: Diagnosis not present

## 2023-02-26 DIAGNOSIS — B961 Klebsiella pneumoniae [K. pneumoniae] as the cause of diseases classified elsewhere: Secondary | ICD-10-CM | POA: Diagnosis present

## 2023-02-26 DIAGNOSIS — N179 Acute kidney failure, unspecified: Secondary | ICD-10-CM | POA: Diagnosis not present

## 2023-02-26 DIAGNOSIS — Z7401 Bed confinement status: Secondary | ICD-10-CM | POA: Diagnosis not present

## 2023-02-26 DIAGNOSIS — Z1629 Resistance to other single specified antibiotic: Secondary | ICD-10-CM | POA: Diagnosis not present

## 2023-02-26 DIAGNOSIS — G4489 Other headache syndrome: Secondary | ICD-10-CM | POA: Diagnosis not present

## 2023-02-26 DIAGNOSIS — M6281 Muscle weakness (generalized): Secondary | ICD-10-CM | POA: Diagnosis not present

## 2023-02-26 DIAGNOSIS — R54 Age-related physical debility: Secondary | ICD-10-CM | POA: Diagnosis not present

## 2023-02-26 DIAGNOSIS — Z7902 Long term (current) use of antithrombotics/antiplatelets: Secondary | ICD-10-CM | POA: Diagnosis not present

## 2023-02-26 DIAGNOSIS — R6889 Other general symptoms and signs: Secondary | ICD-10-CM | POA: Diagnosis not present

## 2023-02-26 DIAGNOSIS — G9341 Metabolic encephalopathy: Secondary | ICD-10-CM | POA: Diagnosis not present

## 2023-02-26 DIAGNOSIS — K5090 Crohn's disease, unspecified, without complications: Secondary | ICD-10-CM | POA: Diagnosis not present

## 2023-02-26 DIAGNOSIS — K922 Gastrointestinal hemorrhage, unspecified: Secondary | ICD-10-CM | POA: Diagnosis not present

## 2023-02-26 DIAGNOSIS — N3001 Acute cystitis with hematuria: Secondary | ICD-10-CM | POA: Diagnosis not present

## 2023-02-26 DIAGNOSIS — R112 Nausea with vomiting, unspecified: Secondary | ICD-10-CM | POA: Diagnosis not present

## 2023-02-26 DIAGNOSIS — Z9049 Acquired absence of other specified parts of digestive tract: Secondary | ICD-10-CM | POA: Diagnosis not present

## 2023-02-26 DIAGNOSIS — I69354 Hemiplegia and hemiparesis following cerebral infarction affecting left non-dominant side: Secondary | ICD-10-CM | POA: Diagnosis not present

## 2023-02-26 DIAGNOSIS — R9431 Abnormal electrocardiogram [ECG] [EKG]: Secondary | ICD-10-CM | POA: Diagnosis not present

## 2023-02-26 DIAGNOSIS — Z743 Need for continuous supervision: Secondary | ICD-10-CM | POA: Diagnosis not present

## 2023-02-26 DIAGNOSIS — R7881 Bacteremia: Secondary | ICD-10-CM | POA: Diagnosis not present

## 2023-02-26 DIAGNOSIS — M1990 Unspecified osteoarthritis, unspecified site: Secondary | ICD-10-CM | POA: Diagnosis not present

## 2023-02-26 DIAGNOSIS — K5190 Ulcerative colitis, unspecified, without complications: Secondary | ICD-10-CM | POA: Diagnosis not present

## 2023-02-26 DIAGNOSIS — K5000 Crohn's disease of small intestine without complications: Secondary | ICD-10-CM | POA: Diagnosis not present

## 2023-02-26 DIAGNOSIS — K8689 Other specified diseases of pancreas: Secondary | ICD-10-CM | POA: Diagnosis not present

## 2023-02-26 DIAGNOSIS — K861 Other chronic pancreatitis: Secondary | ICD-10-CM | POA: Diagnosis not present

## 2023-02-26 DIAGNOSIS — Z8673 Personal history of transient ischemic attack (TIA), and cerebral infarction without residual deficits: Secondary | ICD-10-CM | POA: Diagnosis not present

## 2023-02-26 DIAGNOSIS — K219 Gastro-esophageal reflux disease without esophagitis: Secondary | ICD-10-CM | POA: Diagnosis not present

## 2023-02-26 DIAGNOSIS — K5730 Diverticulosis of large intestine without perforation or abscess without bleeding: Secondary | ICD-10-CM | POA: Diagnosis not present

## 2023-02-26 DIAGNOSIS — K560 Paralytic ileus: Secondary | ICD-10-CM | POA: Diagnosis not present

## 2023-02-26 DIAGNOSIS — I10 Essential (primary) hypertension: Secondary | ICD-10-CM | POA: Diagnosis not present

## 2023-02-26 DIAGNOSIS — K56609 Unspecified intestinal obstruction, unspecified as to partial versus complete obstruction: Secondary | ICD-10-CM | POA: Diagnosis not present

## 2023-02-26 DIAGNOSIS — K56600 Partial intestinal obstruction, unspecified as to cause: Secondary | ICD-10-CM | POA: Diagnosis not present

## 2023-02-26 DIAGNOSIS — E876 Hypokalemia: Secondary | ICD-10-CM | POA: Diagnosis not present

## 2023-02-26 DIAGNOSIS — B49 Unspecified mycosis: Secondary | ICD-10-CM | POA: Diagnosis not present

## 2023-02-26 DIAGNOSIS — E538 Deficiency of other specified B group vitamins: Secondary | ICD-10-CM | POA: Diagnosis not present

## 2023-02-26 DIAGNOSIS — Z9181 History of falling: Secondary | ICD-10-CM | POA: Diagnosis not present

## 2023-02-26 DIAGNOSIS — G894 Chronic pain syndrome: Secondary | ICD-10-CM | POA: Diagnosis not present

## 2023-02-26 DIAGNOSIS — N390 Urinary tract infection, site not specified: Secondary | ICD-10-CM | POA: Diagnosis not present

## 2023-02-26 DIAGNOSIS — Z8249 Family history of ischemic heart disease and other diseases of the circulatory system: Secondary | ICD-10-CM | POA: Diagnosis not present

## 2023-02-26 DIAGNOSIS — E860 Dehydration: Secondary | ICD-10-CM | POA: Diagnosis not present

## 2023-02-26 DIAGNOSIS — Z1611 Resistance to penicillins: Secondary | ICD-10-CM | POA: Diagnosis not present

## 2023-02-26 LAB — GLUCOSE, CAPILLARY
Glucose-Capillary: 198 mg/dL — ABNORMAL HIGH (ref 70–99)
Glucose-Capillary: 66 mg/dL — ABNORMAL LOW (ref 70–99)
Glucose-Capillary: 77 mg/dL (ref 70–99)
Glucose-Capillary: 78 mg/dL (ref 70–99)
Glucose-Capillary: 83 mg/dL (ref 70–99)

## 2023-02-26 MED ORDER — CHLORHEXIDINE GLUCONATE CLOTH 2 % EX PADS
6.0000 | MEDICATED_PAD | Freq: Every day | CUTANEOUS | Status: DC
  Administered 2023-02-26: 6 via TOPICAL

## 2023-02-26 MED ORDER — PANTOPRAZOLE SODIUM 40 MG PO TBEC: 40.0000 mg | DELAYED_RELEASE_TABLET | Freq: Two times a day (BID) | ORAL | Status: DC

## 2023-02-26 MED ORDER — DEXTROSE 50 % IV SOLN
0.5000 | Freq: Once | INTRAVENOUS | Status: AC
  Administered 2023-02-26: 25 mL via INTRAVENOUS
  Filled 2023-02-26: qty 50

## 2023-02-26 MED ORDER — OXYCODONE HCL 5 MG PO TABS
5.0000 mg | ORAL_TABLET | Freq: Three times a day (TID) | ORAL | 0 refills | Status: DC | PRN
Start: 2023-02-26 — End: 2023-04-27

## 2023-02-26 MED ORDER — MICAFUNGIN SODIUM 50 MG IV SOLR: 100.0000 mg | INTRAVENOUS | Status: AC

## 2023-02-26 NOTE — Progress Notes (Signed)
Physical Therapy Treatment Patient Details Name: [name] MRN: [id number] DOB: 10-31-1945 Today's Date: 02/26/2023   History of Present Illness 77 yo female [facility] for n/v x3 days. Workup for SBO. Recent admission 8/24-8/30 for subacute CVA, R ICA stenosis, AKI. PMH includes GIB, HTN, crohn's disease, CVA, SBO s/p hemicolectomy.    PT Comments  Pt reporting headache, limited ability to participate in PT due to this per pt. Pt agreeable to repositioning for pulmonary health and pressure relief, but requires total assist for all bed mobility to achieve this. Pt tolerated LE AAROM exercises well, decreased hip and knee ROM on RLE. Plan remains appropraite.       If plan is discharge home, recommend the following: Help with stairs or ramp for entrance;Assistance with cooking/housework;Two people to help with walking and/or transfers;Assist for transportation;Supervision due to cognitive status;Direct supervision/assist for medications management;Assistance with feeding   Can travel by private vehicle        Equipment Recommendations  None recommended by PT    Recommendations for Other Services       Precautions / Restrictions Precautions Precautions: Fall Precaution Comments: low vision Restrictions Weight Bearing Restrictions: No     Mobility  Bed Mobility Overal bed mobility: Needs Assistance Bed Mobility: Supine to Sit           General bed mobility comments: boost up in bed total assist +1    Transfers                   General transfer comment: nt    Ambulation/Gait               General Gait Details: nt   Stairs             Wheelchair Mobility     Tilt Bed    Modified Rankin (Stroke Patients Only)       Balance                                            Cognition Arousal: Lethargic Behavior During Therapy: Restless, Anxious Overall Cognitive Status: History of  cognitive impairments - at baseline                                 General Comments: pt disoriented to time, thinks it is nighttime and starts praying her evening prayers aloud. Pt self-limting        Exercises General Exercises - Lower Extremity Ankle Circles/Pumps: AAROM, Both, 10 reps, Supine Heel Slides: AAROM, Left, 10 reps, Right, 5 reps, Supine Hip ABduction/ADduction: AAROM, Both, 10 reps, Supine    General Comments        Pertinent Vitals/Pain Pain Assessment Pain Assessment: Faces Faces Pain Scale: Hurts little more Pain Location: head Pain Descriptors / Indicators: Grimacing, Headache Pain Intervention(s): Limited activity within patient's tolerance, Monitored during session, Repositioned    Home Living                          Prior Function            PT Goals (current goals can now be found in the care plan section) Acute Rehab PT Goals PT Goal Formulation: Patient unable to participate in goal setting Time For Goal  Achievement: 03/09/23 Potential to Achieve Goals: Fair Progress towards PT goals: Progressing toward goals    Frequency    Min 1X/week      PT Plan      Co-evaluation              AM-PAC PT "6 Clicks" Mobility   Outcome Measure  Help needed turning from your back to your side while in a flat bed without using bedrails?: A Lot Help needed moving from lying on your back to sitting on the side of a flat bed without using bedrails?: Total Help needed moving to and from a bed to a chair (including a wheelchair)?: Total Help needed standing up from a chair using your arms (e.g., wheelchair or bedside chair)?: Total Help needed to walk in hospital room?: Total Help needed climbing 3-5 steps with a railing? : Total 6 Click Score: 7    End of Session   Activity Tolerance: Patient limited by fatigue;Patient limited by pain Patient left: in bed;with call bell/phone within reach;with bed alarm set;with SCD's  reapplied Nurse Communication: Mobility status PT Visit Diagnosis: Unsteadiness on feet (R26.81);Other abnormalities of gait and mobility (R26.89);Muscle weakness (generalized) (M62.81)     Time: 0981-1914 PT Time Calculation (min) (ACUTE ONLY): 14 min  Charges:    $Therapeutic Activity: 8-22 mins PT General Charges $$ ACUTE PT VISIT: 1 Visit                     Marye Round, PT DPT Acute Rehabilitation Services Secure Chat Preferred  Office [phone]    Paysen Goza Sheliah Plane 02/26/2023, 2:49 PM

## 2023-02-26 NOTE — Progress Notes (Signed)
Pt with no urine out put last night per night shift. Obtained bladder scan and showed . 14 french foley reinserted d/t urinary retention per Dr. Daune Perch order.  Hinton Dyer, RN

## 2023-02-26 NOTE — TOC Progression Note (Addendum)
Transition of Care [facility]) - Progression Note    Patient Details  Name: [name] MRN: [id number] Date of Birth: [birth year]  [facility]) CM/SW Contact  Delilah Shan, LCSWA Phone Number: 02/26/2023, 1:15 PM  Clinical Narrative:     CSW spoke with [name] with [facility] who confirmed patient can dc over today if medically ready. Insurance authorization approved through 9/13. CSW informed MD. CSW will continue to follow.  Expected Discharge Plan: Skilled Nursing Facility Barriers to Discharge: Continued Medical Work up  Expected Discharge Plan and Services In-house Referral: Clinical Social Work     Living arrangements for the past 2 months:  (from [facility] Short term lives at home with her daughter and sister)                                       Social Determinants of Health (SDOH) Interventions SDOH Screenings   Food Insecurity: No Food Insecurity (02/20/2023)  Housing: Low Risk  (02/20/2023)  Transportation Needs: No Transportation Needs (02/20/2023)  Utilities: Not At Risk (02/20/2023)  Tobacco Use: High Risk (02/19/2023)    Readmission Risk Interventions     No data to display

## 2023-02-26 NOTE — Discharge Summary (Signed)
Physician Discharge Summary   Patient: [name] MRN: [id number] DOB: 04-Nov-1945  Admit date:     02/19/2023  Discharge date: 02/26/23  Discharge Physician: Meredeth Ide   PCP: Assunta Found, MD   Recommendations at discharge:   Patient has Foley catheter in place, consider voiding trial in 3 days Continue micafungin 100 mg IV daily for 10 days, last day of therapy 03/08/23 Midline placed for IV antibiotics, midline can be discontinued after IV antibiotics complete  Discharge Diagnoses: Principal Problem:   Small bowel obstruction (HCC) Active Problems:   Sepsis secondary to UTI [facility])   Urinary retention   Crohn's disease (HCC)   Upper GI bleed   HTN (hypertension)   Stroke (cerebrum) (HCC)   AKI (acute kidney injury) (HCC)   Abnormal ECG   Demand ischemia   Aortic dissection (HCC)   Fungemia  Resolved Problems:   * No resolved hospital problems. [facility] Course:   77 year old with history of previous GI bleed, hypertension, Crohn's disease, recent stroke with left hemiparesis, recent admission to the hospital 8/24-8/30 for acute stroke and discharged to a skilled nursing home.  She presents back to the emergency room from SNF with abdominal pain, distention and nausea vomiting over the last 3 days.  Also reported headache and back pain. .  Leukocytosis with white blood cell count of 29,000.  Potassium 3.4.  Patient was started on IV fluid boluses, cultures were collected and is started on IV Zosyn.    CT scan abdomen pelvis showed small bowel obstruction with transition point in the lower posterior abdomen. Initial EKG suggested ST elevation, ruled out.  Patient was ultimately transferred to [facility] for higher level of care. Hospitalization further complicated by concern for GI bleed, UTI, Candida bacteremia  Assessment and Plan:   Partial small bowel obstruction due to adhesions -Resolved -General Surgery was consulted; patient moving her  bowels -Tolerating diet, general surgery has signed off     Sepsis present on admission secondary to E. coli UTI:  -Completed treatment with Zosyn   Candida bacteremia: -Plan for 2 weeks of micafungin.   Follow-up outpatient ophthalmology to rule out endophthalmitis.  Repeat blood cultures 9/8 are negative to date.   Midline ordered. OPAT in place  -Last day of therapy is 03/08/2023   Abnormal EKG with ST elevation:  -Seen by cardiology.  Mildly elevated troponins, echo findings unremarkable.   -Normal ejection fraction.   -Patient with acute small bowel obstruction, recent stroke.  - Symptomatic treatment.   -Not a candidate for anticoagulation or left heart cath.   Acute GI bleeding:  -Suspect lower GI bleeding.   -NG tube suction without evidence of bleeding.  -Patient is not a candidate for anesthesia endoscopy now.  -Started on PPI twice daily.    History of stroke:  -Aspirin and Plavix were held for GI bleed -Discussed with neurology, Dr. Pearlean Brownie -Will restart Plavix and discontinue aspirin -    Acute urinary retention:  -Foley catheter removed; however patient again developed urinary retention -Foley catheter has been reinserted -Consider voiding trial at facility in next 3 days   Goals of care: -Palliative care medicine consulted -Patient is DNR/DNI       Consultants: General Surgery, palliative care Procedures performed:  Disposition: Skilled nursing facility Diet recommendation:  Discharge Diet Orders (From admission, onward)     Start     Ordered   02/26/23 0000  Diet - low sodium heart healthy  02/26/23 1341           Regular diet DISCHARGE MEDICATION: Allergies as of 02/26/2023       Reactions   Flagyl [metronidazole Hcl] Other (See Comments)   "makes me feel whoozy and weird"   Other    Unknown antibiotic and unknown pain medications        Medication List     STOP taking these medications    aspirin EC 81 MG tablet    HYDROcodone-acetaminophen 5-325 MG tablet Commonly known as: NORCO/VICODIN   methocarbamol 500 MG tablet Commonly known as: ROBAXIN       TAKE these medications    acetaminophen 500 MG tablet Commonly known as: TYLENOL Take 1 tablet (500 mg total) by mouth every 6 (six) hours as needed for mild pain.   clopidogrel 75 MG tablet Commonly known as: PLAVIX Take 1 tablet (75 mg total) by mouth daily.   metoprolol succinate 25 MG 24 hr tablet Commonly known as: Toprol XL Take 0.5 tablets (12.5 mg total) by mouth daily.   micafungin 50 MG injection Commonly known as: MYCAMINE Inject 100 mg into the vein daily for 10 days. Indication:  Candidemia First Dose: Yes Last Day of Therapy:  03/08/23 Labs - Once weekly:  CBC/D and BMP, Labs - Once weekly: ESR and CRP Method of administration: Elastomeric Method of administration may be changed at the discretion of home infusion pharmacist based upon assessment of the patient and/or caregiver's ability to self-administer the medication ordered.   nicotine 21 mg/24hr patch Commonly known as: NICODERM CQ - dosed in mg/24 hours Place 1 patch (21 mg total) onto the skin daily.   oxyCODONE 5 MG immediate release tablet Commonly known as: Roxicodone Take 1 tablet (5 mg total) by mouth every 8 (eight) hours as needed.   pantoprazole 40 MG tablet Commonly known as: PROTONIX Take 1 tablet (40 mg total) by mouth 2 (two) times daily before a meal.   sodium bicarbonate 650 MG tablet Take 1 tablet (650 mg total) by mouth 2 (two) times daily.   sulfaSALAzine 500 MG tablet Commonly known as: AZULFIDINE TAKE (2) TABLETS BY MOUTH TWICE DAILY.   urea 15 g Pack oral packet Commonly known as: URE-NA Take 15 g by mouth 2 (two) times daily.               Discharge Care Instructions  (From admission, onward)           Start     Ordered   02/26/23 0000  Change dressing on IV access line weekly and PRN  (Home infusion instructions -  [facility] )        02/26/23 1341            Discharge Exam: Filed Weights   02/19/23 1403 02/20/23 0318  Weight: 54.6 kg 52.1 kg   General-appears in no acute distress Heart-S1-S2, regular, no murmur auscultated Lungs-clear to auscultation bilaterally, no wheezing or crackles auscultated Abdomen-soft, nontender, no organomegaly Extremities-no edema in the lower extremities Neuro-alert, oriented x3, no focal deficit noted  Condition at discharge: good  The results of significant diagnostics from this hospitalization (including imaging, microbiology, ancillary and laboratory) are listed below for reference.   Imaging Studies: VAS [place] UPPER EXTREMITY VENOUS DUPLEX  Result Date: 02/25/2023 UPPER VENOUS STUDY  Patient Name:  [name]  Date of Exam:   02/25/2023 Medical Rec #: [id number]     Accession #:    [id number] Date of Birth:  08/01/1945     Patient Gender: F Patient Age:   29 years Exam Location:  [facility] Procedure:      VAS [place] UPPER EXTREMITY VENOUS DUPLEX Referring Phys: JENNIFER CHOI --------------------------------------------------------------------------------  Indications: Edema Risk Factors: Immobility. Limitations: Body habitus and Limited awareness/range of motion. Comparison Study: No prior study Performing Technologist: Shona Simpson  Examination Guidelines: A complete evaluation includes B-mode imaging, spectral Doppler, color Doppler, and power Doppler as needed of all accessible portions of each vessel. Bilateral testing is considered an integral part of a complete examination. Limited examinations for reoccurring indications may be performed as noted.  Right Findings: +----------+------------+---------+-----------+----------+-------+ RIGHT     CompressiblePhasicitySpontaneousPropertiesSummary +----------+------------+---------+-----------+----------+-------+ Subclavian    Full       Yes       Yes                       +----------+------------+---------+-----------+----------+-------+  Left Findings: +----------+------------+---------+-----------+----------+-------+ LEFT      CompressiblePhasicitySpontaneousPropertiesSummary +----------+------------+---------+-----------+----------+-------+ IJV           Full       Yes       Yes                      +----------+------------+---------+-----------+----------+-------+ Subclavian    Full       Yes       Yes                      +----------+------------+---------+-----------+----------+-------+ Axillary      Full       Yes       Yes                      +----------+------------+---------+-----------+----------+-------+ Brachial      Full       Yes       Yes                      +----------+------------+---------+-----------+----------+-------+ Radial        Full       Yes       Yes                      +----------+------------+---------+-----------+----------+-------+ Ulnar         Full       Yes       Yes                      +----------+------------+---------+-----------+----------+-------+ Cephalic      Full       Yes       Yes                      +----------+------------+---------+-----------+----------+-------+ Basilic       Full       Yes       Yes                      +----------+------------+---------+-----------+----------+-------+  Summary:  Right: No evidence of thrombosis in the subclavian.  Left: No evidence of deep vein thrombosis in the upper extremity. No evidence of superficial vein thrombosis in the upper extremity.  *See table(s) above for measurements and observations.     Preliminary    DG Abd Portable 1V-Small Bowel Obstruction Protocol-initial, 8 hr delay  Result Date: 02/20/2023 CLINICAL DATA:  SBO 8 hour delay EXAM: PORTABLE ABDOMEN - 1 VIEW  COMPARISON:  02/20/2023, CT 02/19/2023 FINDINGS: Esophageal tube tip at the distal esophagus. Mild central small bowel distension. Enteral contrast appears  within the colon. Partially visualized left hip replacement IMPRESSION: 1. Esophageal tube tip at the distal esophageal region 2. Enteral contrast visible in the colon. Mild residual small bowel distension but probably decreased compared to prior radiograph Electronically Signed   By: Jasmine Pang M.D.   On: 02/20/2023 21:24   ECHOCARDIOGRAM COMPLETE  Result Date: 02/20/2023    ECHOCARDIOGRAM REPORT   Patient Name:   [name] Date of Exam: 02/20/2023 Medical Rec #:  [id number]    Height:       65.0 in Accession #:    [id number]   Weight:       120.4 lb Date of Birth:  Apr 14, 1946    BSA:          1.594 m Patient Age:    14 years     BP:           131/65 mmHg Patient Gender: F            HR:           97 bpm. Exam Location:  Inpatient Procedure: 2D Echo, Color Doppler and Cardiac Doppler Indications:    Abnormal ECG  History:        Patient has prior history of Echocardiogram examinations, most                 recent 02/08/2023. Stroke; Risk Factors:Hypertension and Current                 Smoker.  Sonographer:    Milda Smart Referring Phys: [phone] DONALD WICKLINE  Sonographer Comments: Image acquisition challenging due to patient body habitus and Image acquisition challenging due to respiratory motion. IMPRESSIONS  1. Left ventricular ejection fraction, by estimation, is 65 to 70%. The left ventricle has normal function. The left ventricle has no regional wall motion abnormalities. Left ventricular diastolic parameters are consistent with Grade I diastolic dysfunction (impaired relaxation).  2. Right ventricular systolic function is normal. The right ventricular size is mildly enlarged. There is normal pulmonary artery systolic pressure. The estimated right ventricular systolic pressure is 19.6 mmHg.  3. The mitral valve is grossly normal. Trivial mitral valve regurgitation. No evidence of mitral stenosis.  4. The aortic valve is tricuspid. There is mild calcification of the aortic valve. There is mild  thickening of the aortic valve. Aortic valve regurgitation is not visualized. Aortic valve sclerosis/calcification is present, without any evidence of aortic stenosis. Aortic valve area, by VTI measures 1.74 cm.  5. The inferior vena cava is normal in size with greater than 50% respiratory variability, suggesting right atrial pressure of 3 mmHg. Comparison(s): No significant change from prior study. FINDINGS  Left Ventricle: Left ventricular ejection fraction, by estimation, is 65 to 70%. The left ventricle has normal function. The left ventricle has no regional wall motion abnormalities. The left ventricular internal cavity size was normal in size. There is  no left ventricular hypertrophy. Left ventricular diastolic parameters are consistent with Grade I diastolic dysfunction (impaired relaxation). Right Ventricle: The right ventricular size is mildly enlarged. No increase in right ventricular wall thickness. Right ventricular systolic function is normal. There is normal pulmonary artery systolic pressure. The tricuspid regurgitant velocity is 2.04  m/s, and with an assumed right atrial pressure of 3 mmHg, the estimated right ventricular systolic pressure is 19.6 mmHg. Left Atrium: Left atrial size was normal  in size. Right Atrium: Right atrial size was normal in size. Pericardium: There is no evidence of pericardial effusion. Presence of epicardial fat layer. Mitral Valve: The mitral valve is grossly normal. Trivial mitral valve regurgitation. No evidence of mitral valve stenosis. MV peak gradient, 7.2 mmHg. The mean mitral valve gradient is 3.0 mmHg. Tricuspid Valve: The tricuspid valve is grossly normal. Tricuspid valve regurgitation is trivial. No evidence of tricuspid stenosis. The flow in the hepatic veins is normal during ventricular systole. Aortic Valve: The aortic valve is tricuspid. There is mild calcification of the aortic valve. There is mild thickening of the aortic valve. Aortic valve regurgitation  is not visualized. Aortic valve sclerosis/calcification is present, without any evidence of aortic stenosis. Aortic valve mean gradient measures 9.0 mmHg. Aortic valve peak gradient measures 16.2 mmHg. Aortic valve area, by VTI measures 1.74 cm. Pulmonic Valve: The pulmonic valve was grossly normal. Pulmonic valve regurgitation is not visualized. No evidence of pulmonic stenosis. Aorta: The aortic root, ascending aorta and aortic arch are all structurally normal, with no evidence of dilitation or obstruction and the aortic root is normal in size and structure. Venous: The inferior vena cava is normal in size with greater than 50% respiratory variability, suggesting right atrial pressure of 3 mmHg. IAS/Shunts: The atrial septum is grossly normal.  LEFT VENTRICLE PLAX 2D LVIDd:         3.40 cm   Diastology LVIDs:         2.20 cm   LV e' medial:    4.57 cm/s LV PW:         0.80 cm   LV E/e' medial:  16.9 LV IVS:        0.80 cm   LV e' lateral:   4.62 cm/s LVOT diam:     1.70 cm   LV E/e' lateral: 16.7 LV SV:         58 LV SV Index:   36 LVOT Area:     2.27 cm  RIGHT VENTRICLE RV Basal diam:  3.40 cm RV S prime:     12.40 cm/s TAPSE (M-mode): 2.0 cm LEFT ATRIUM             Index       RIGHT ATRIUM          Index LA diam:        2.20 cm 1.38 cm/m  RA Area:     7.90 cm LA Vol (A2C):   14.7 ml 9.22 ml/m  RA Volume:   14.90 ml 9.35 ml/m LA Vol (A4C):   14.4 ml 9.00 ml/m LA Biplane Vol: 15.5 ml 9.72 ml/m  AORTIC VALVE AV Area (Vmax):    1.71 cm AV Area (Vmean):   1.67 cm AV Area (VTI):     1.74 cm AV Vmax:           201.00 cm/s AV Vmean:          148.000 cm/s AV VTI:            0.334 m AV Peak Grad:      16.2 mmHg AV Mean Grad:      9.0 mmHg LVOT Vmax:         151.00 cm/s LVOT Vmean:        109.000 cm/s LVOT VTI:          0.256 m LVOT/AV VTI ratio: 0.77  AORTA Ao Root diam: 2.80 cm MITRAL VALVE  TRICUSPID VALVE MV Area (PHT): 2.85 cm     TR Peak grad:   16.6 mmHg MV Area VTI:   2.55 cm     TR Vmax:         204.00 cm/s MV Peak grad:  7.2 mmHg MV Mean grad:  3.0 mmHg     SHUNTS MV Vmax:       1.34 m/s     Systemic VTI:  0.26 m MV Vmean:      87.4 cm/s    Systemic Diam: 1.70 cm MV Decel Time: 266 msec MV E velocity: 77.40 cm/s MV A velocity: 105.00 cm/s MV E/A ratio:  0.74 Lennie Odor MD Electronically signed by Lennie Odor MD Signature Date/Time: 02/20/2023/7:36:29 AM    Final    DG Abdomen 1 View  Result Date: 02/20/2023 CLINICAL DATA:  ng tube placement EXAM: ABDOMEN - 1 VIEW COMPARISON:  CT abdomen pelvis 02/19/2023 FINDINGS: Enteric tube courses below the hemidiaphragm with tip overlying the expected region of the gastroesophageal junction/proximal gastric lumen and side port overlying the expected region of the distal esophagus. Dilatation of several loops of small bowel. No radio-opaque calculi or other significant radiographic abnormality are seen. IMPRESSION: 1. Enteric tube courses below the hemidiaphragm with tip overlying the expected region of the gastroesophageal junction/proximal gastric lumen and side port overlying the expected region of the distal esophagus. Recommend advancing by 8 cm. 2. Small-bowel obstruction. Electronically Signed   By: Tish Frederickson M.D.   On: 02/20/2023 03:03   DG Chest Portable 1 View  Result Date: 02/20/2023 CLINICAL DATA:  Pain, vomiting EXAM: PORTABLE CHEST 1 VIEW COMPARISON:  02/19/2023 FINDINGS: The heart size and mediastinal contours are within normal limits. Both lungs are clear. The visualized skeletal structures are unremarkable. IMPRESSION: Normal study. Electronically Signed   By: Charlett Nose M.D.   On: 02/20/2023 02:05   DG Chest Portable 1 View  Result Date: 02/19/2023 CLINICAL DATA:  NG tube advancement. EXAM: PORTABLE CHEST 1 VIEW COMPARISON:  Radiographs 02/19/2023 at 9:39 p.m. FINDINGS: Similar position of the NG tube tip near the gastroesophageal junction and side-port in the distal esophagus. Recommend advancement approximately 10 cm to  ensure side port is within the stomach. Remainder unchanged. IMPRESSION: Similar position of the NG tube in the distal esophagus. Recommend advancement approximately 10 cm to ensure side port is within the stomach. Electronically Signed   By: Minerva Fester M.D.   On: 02/19/2023 23:44   DG Chest Port 1 View  Result Date: 02/19/2023 CLINICAL DATA:  NG tube placement EXAM: PORTABLE CHEST 1 VIEW COMPARISON:  02/12/2023 FINDINGS: NG tube tip is in the distal esophagus. Heart mediastinal contours within normal limits. Visualized lungs clear. No effusions. Lung apices not visualized. IMPRESSION: NG tube tip in the distal esophagus. This could be advanced several cm into the stomach. Electronically Signed   By: Charlett Nose M.D.   On: 02/19/2023 22:14   CT ABDOMEN PELVIS W CONTRAST  Result Date: 02/19/2023 CLINICAL DATA:  Nausea and vomiting for 3 days.  Abdominal pain. EXAM: CT ABDOMEN AND PELVIS WITH CONTRAST TECHNIQUE: Multidetector CT imaging of the abdomen and pelvis was performed using the standard protocol following bolus administration of intravenous contrast. RADIATION DOSE REDUCTION: This exam was performed according to the departmental dose-optimization program which includes automated exposure control, adjustment of the mA and/or kV according to patient size and/or use of iterative reconstruction technique. CONTRAST:  75mL OMNIPAQUE IOHEXOL 300 MG/ML  SOLN COMPARISON:  07/07/2015 FINDINGS: Lower  chest: No acute abnormality. Hepatobiliary: Respiratory motion mildly degrades detail in the upper abdomen. Unremarkable liver. Cholecystectomy. Prominent bile duct likely due to reservoir effect. Pancreas: Coarse calcifications in the pancreatic head due to sequela of chronic pancreatitis. No acute pancreatitis. No ductal dilation. Spleen: Unremarkable. Adrenals/Urinary Tract: Stable adrenal glands. No urinary calculi or hydronephrosis. Nondistended bladder about a Foley catheter. Stomach/Bowel: Stomach is  within normal limits. Postoperative changes with neoterminal ileum. Marked dilation of the small bowel with air-fluid levels and transition point in the low posterior abdomen (series 2/image 50). Decompressed colon. Colonic diverticulosis without diverticulitis. Duodenal diverticulum. Vascular/Lymphatic: Aortic atherosclerosis. No enlarged abdominal or pelvic lymph nodes. Reproductive: Uterus and bilateral adnexa are unremarkable. Other: No free intraperitoneal fluid or air. 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 Musculoskeletal: Left THA.  No acute fracture. IMPRESSION: 1. Small-bowel obstruction with transition point in the low posterior abdomen. Aortic Atherosclerosis (ICD10-I70.0). Electronically Signed   By: Minerva Fester M.D.   On: 02/19/2023 18:04   CT Head Wo Contrast  Result Date: 02/19/2023 CLINICAL DATA:  Headache, sudden, severe.  Nausea and vomiting. EXAM: CT HEAD WITHOUT CONTRAST TECHNIQUE: Contiguous axial images were obtained from the base of the skull through the vertex without intravenous contrast. RADIATION DOSE REDUCTION: This exam was performed according to the departmental dose-optimization program which includes automated exposure control, adjustment of the mA and/or kV according to patient size and/or use of iterative reconstruction technique. COMPARISON:  CTA head and neck 02/09/2023.  MRI head 02/08/2023. FINDINGS: Brain: Hypodensity associated with the posterior right MCA territory infarct has decreased compared to the prior CT with cortex currently only being subtly hypodense likely reflecting fogging phenomenon. No new infarct, acute hemorrhage, mass, or midline shift is evident. Small chronic left parieto-occipital cortical infarcts are again noted, and chronic small vessel ischemia is again noted involving the cerebral white matter bilaterally. Prominent extra-axial CSF over both cerebral convexities is unchanged and attributed to atrophy. Prominent extra-axial CSF in  the posterior fossa overlying the left greater than right cerebellar hemispheres is also unchanged. Vascular: Calcified atherosclerosis at the skull base. No hyperdense vessel. Skull: No acute fracture or suspicious osseous lesion. Sinuses/Orbits: Paranasal sinuses and mastoid air cells are clear. Unremarkable orbits. Other: None. IMPRESSION: 1. No evidence of acute intracranial abnormality. 2. Interval evolution of a subacute posterior right MCA territory infarct. 3. Chronic small vessel ischemia. Electronically Signed   By: Sebastian Ache M.D.   On: 02/19/2023 18:02   DG Chest 2 View  Result Date: 02/12/2023 CLINICAL DATA:  Pneumonia EXAM: CHEST - 2 VIEW COMPARISON:  01/24/2023 FINDINGS: Heart and mediastinal contours are within normal limits. No focal opacities or effusions. No acute bony abnormality. IMPRESSION: No active cardiopulmonary disease. Electronically Signed   By: Charlett Nose M.D.   On: 02/12/2023 10:21   CT ANGIO HEAD NECK W WO CM  Result Date: 02/09/2023 CLINICAL DATA:  Neuro deficit, acute, stroke suspected; Neck pain, acute, no red flags EXAM: CT CERVICAL SPINE WITHOUT CONTRAST CT ANGIOGRAPHY HEAD AND NECK WITH AND WITHOUT CONTRAST TECHNIQUE: Multidetector CT imaging of the cervical spine was performed without intravenous contrast. Multiplanar CT image reconstructions were also generated. Multidetector CT imaging of the head and neck was performed using the standard protocol during bolus administration of intravenous contrast. Multiplanar CT image reconstructions and MIPs were obtained to evaluate the vascular anatomy. Carotid stenosis measurements (when applicable) are obtained utilizing NASCET criteria, using the distal internal carotid diameter as the denominator. RADIATION DOSE REDUCTION: This exam was  performed according to the departmental dose-optimization program which includes automated exposure control, adjustment of the mA and/or kV according to patient size and/or use of  iterative reconstruction technique. CONTRAST:  75mL OMNIPAQUE IOHEXOL 350 MG/ML SOLN COMPARISON:  None Available. FINDINGS: CT HEAD FINDINGS See same day brain MRI for intracranial findings. No contrast-enhancing lesion is visualized. There is decreased arborization in the distal right MCA territory. CT CERVICAL SPINE FINDINGS: Alignment: Straightening of the normal cervical lordosis. Trace retrolisthesis of C5 on C6. Trace anterolisthesis of C7 on T1. Skull base and vertebrae: No acute fracture. No primary bone lesion or focal pathologic process. Soft tissues and spinal canal: No prevertebral fluid or swelling. No visible canal hematoma. Disc levels:  No evidence of high-grade spinal canal stenosis Upper chest: Mild centrilobular emphysema Other: None. CTA NECK FINDINGS Aortic arch: Standard branching. Imaged portion shows no evidence of aneurysm or dissection. No significant stenosis of the major arch vessel origins. Mild narrowing of the origin of the right subclavian artery. Mild narrowing of the mid segment of the left subclavian artery. Right carotid system: There is severe, nearly occlusive stenosis in the proximal right ICA. The lumen of the level of stenosis is difficult to visualize and likely measures less than 1 mm of the site of greatest stenosis (series 19, image 33). Left carotid system: There is likely a carotid web in the proximal left ICA (series 18, image 31). Likely postsurgical changes from a prior left-sided carotid endarterectomy. Vertebral arteries: Left dominant. The right vertebral artery terminates in a PICA. No evidence of dissection, stenosis (50% or greater), or occlusion. Skeleton: See above Other neck: Negative. Upper chest: See above Review of the MIP images confirms the above findings CTA HEAD FINDINGS Anterior circulation: There is severe focal stenosis in an M3/M4 segment of the right MCA (series 507, image 102). No proximal occlusion. There is a small 2 mm inferiorly projecting  outpouching of the ICA terminus on the left (series 508, image 120), which could represent a small aneurysm. Posterior circulation: No significant stenosis, proximal occlusion, aneurysm, or vascular malformation. Venous sinuses: As permitted by contrast timing, patent. Anatomic variants: Fetal PCA on the right. Review of the MIP images confirms the above findings IMPRESSION: 1. Severe, nearly occlusive stenosis in the proximal right ICA. The lumen of the level of stenosis is difficult to visualize and likely measures less than 1 mm of the site of greatest stenosis. 2. Severe focal stenosis in an M3/M4 segment of the right MCA. 3. No acute fracture or traumatic listhesis in the cervical spine. 4. Small 2 mm inferiorly projecting outpouching of the ICA terminus on the left, which could represent a small aneurysm. 5. See same day brain MRI for intracranial findings. 6. Emphysema. Emphysema (ICD10-J43.9). Electronically Signed   By: Lorenza Cambridge M.D.   On: 02/09/2023 18:34   CT C-SPINE NO CHARGE  Result Date: 02/09/2023 CLINICAL DATA:  Neuro deficit, acute, stroke suspected; Neck pain, acute, no red flags EXAM: CT CERVICAL SPINE WITHOUT CONTRAST CT ANGIOGRAPHY HEAD AND NECK WITH AND WITHOUT CONTRAST TECHNIQUE: Multidetector CT imaging of the cervical spine was performed without intravenous contrast. Multiplanar CT image reconstructions were also generated. Multidetector CT imaging of the head and neck was performed using the standard protocol during bolus administration of intravenous contrast. Multiplanar CT image reconstructions and MIPs were obtained to evaluate the vascular anatomy. Carotid stenosis measurements (when applicable) are obtained utilizing NASCET criteria, using the distal internal carotid diameter as the denominator. RADIATION DOSE REDUCTION: This  exam was performed according to the departmental dose-optimization program which includes automated exposure control, adjustment of the mA and/or kV  according to patient size and/or use of iterative reconstruction technique. CONTRAST:  75mL OMNIPAQUE IOHEXOL 350 MG/ML SOLN COMPARISON:  None Available. FINDINGS: CT HEAD FINDINGS See same day brain MRI for intracranial findings. No contrast-enhancing lesion is visualized. There is decreased arborization in the distal right MCA territory. CT CERVICAL SPINE FINDINGS: Alignment: Straightening of the normal cervical lordosis. Trace retrolisthesis of C5 on C6. Trace anterolisthesis of C7 on T1. Skull base and vertebrae: No acute fracture. No primary bone lesion or focal pathologic process. Soft tissues and spinal canal: No prevertebral fluid or swelling. No visible canal hematoma. Disc levels:  No evidence of high-grade spinal canal stenosis Upper chest: Mild centrilobular emphysema Other: None. CTA NECK FINDINGS Aortic arch: Standard branching. Imaged portion shows no evidence of aneurysm or dissection. No significant stenosis of the major arch vessel origins. Mild narrowing of the origin of the right subclavian artery. Mild narrowing of the mid segment of the left subclavian artery. Right carotid system: There is severe, nearly occlusive stenosis in the proximal right ICA. The lumen of the level of stenosis is difficult to visualize and likely measures less than 1 mm of the site of greatest stenosis (series 19, image 33). Left carotid system: There is likely a carotid web in the proximal left ICA (series 18, image 31). Likely postsurgical changes from a prior left-sided carotid endarterectomy. Vertebral arteries: Left dominant. The right vertebral artery terminates in a PICA. No evidence of dissection, stenosis (50% or greater), or occlusion. Skeleton: See above Other neck: Negative. Upper chest: See above Review of the MIP images confirms the above findings CTA HEAD FINDINGS Anterior circulation: There is severe focal stenosis in an M3/M4 segment of the right MCA (series 507, image 102). No proximal occlusion. There  is a small 2 mm inferiorly projecting outpouching of the ICA terminus on the left (series 508, image 120), which could represent a small aneurysm. Posterior circulation: No significant stenosis, proximal occlusion, aneurysm, or vascular malformation. Venous sinuses: As permitted by contrast timing, patent. Anatomic variants: Fetal PCA on the right. Review of the MIP images confirms the above findings IMPRESSION: 1. Severe, nearly occlusive stenosis in the proximal right ICA. The lumen of the level of stenosis is difficult to visualize and likely measures less than 1 mm of the site of greatest stenosis. 2. Severe focal stenosis in an M3/M4 segment of the right MCA. 3. No acute fracture or traumatic listhesis in the cervical spine. 4. Small 2 mm inferiorly projecting outpouching of the ICA terminus on the left, which could represent a small aneurysm. 5. See same day brain MRI for intracranial findings. 6. Emphysema. Emphysema (ICD10-J43.9). Electronically Signed   By: Lorenza Cambridge M.D.   On: 02/09/2023 18:34   MR BRAIN WO CONTRAST  Result Date: 02/08/2023 CLINICAL DATA:  Neuro deficit, acute, stroke suspected. EXAM: MRI HEAD WITHOUT CONTRAST TECHNIQUE: Multiplanar, multiecho pulse sequences of the brain and surrounding structures were obtained without intravenous contrast. COMPARISON:  Head CT 02/07/2023 FINDINGS: Brain: There is a moderate-sized acute to early subacute posterior right MCA infarct involving portions of the parietal lobe, lateral occipital lobe, and posterior temporal and frontal lobes. A single focus of microhemorrhage is noted in the right parietal lobe and may be chronic. There is cytotoxic edema associated with the infarct with mild sulcal effacement but no midline shift or other significant mass effect. There are small  chronic left parieto-occipital cortical infarcts. T2 hyperintensities in the cerebral white matter bilaterally are nonspecific but compatible with moderate chronic small vessel  ischemic disease. Chronic lacunar infarcts are noted in the deep cerebral white matter bilaterally. T2 hyperintensities throughout the basal ganglia bilaterally predominantly reflect dilated perivascular spaces, possibly with some interspersed chronic lacunar infarcts. No mass or extra-axial fluid collection is evident. There is moderate cerebral atrophy. Vascular: Major intracranial vascular flow voids are preserved. Skull and upper cervical spine: Unremarkable bone marrow signal. Sinuses/Orbits: Remarkable orbits. Clear paranasal sinuses. Trace bilateral mastoid effusions. Other: None. IMPRESSION: 1. Moderate-sized acute to early subacute posterior right MCA infarct. 2. Moderate chronic small vessel ischemic disease and cerebral atrophy. Electronically Signed   By: Sebastian Ache M.D.   On: 02/08/2023 18:09   ECHOCARDIOGRAM COMPLETE  Result Date: 02/08/2023    ECHOCARDIOGRAM REPORT   Patient Name:   [name] Date of Exam: 02/08/2023 Medical Rec #:  [id number]    Height:       65.0 in Accession #:    [id number]   Weight:       117.1 lb Date of Birth:  August 15, 1945    BSA:          1.575 m Patient Age:    49 years     BP:           148/82 mmHg Patient Gender: F            HR:           98 bpm. [facility] Procedure: 2D Echo, Cardiac Doppler, Color Doppler and Intracardiac            Opacification Agent Indications:    Stroke l63.9  History:        Patient has no prior history of Echocardiogram examinations.                 Risk Factors:Hypertension and Current Smoker.  Sonographer:    Celesta Gentile RCS Referring Phys: [id number] OLADAPO ADEFESO  Sonographer Comments: Technically difficult study due to poor echo windows. IMPRESSIONS  1. Left ventricular ejection fraction, by estimation, is 70 to 75%. The left ventricle has hyperdynamic function. The left ventricle has no regional wall motion abnormalities. There is mild asymmetric left ventricular hypertrophy of the basal-septal segment. Left  ventricular diastolic parameters are consistent with Grade I diastolic dysfunction (impaired relaxation).  2. Right ventricular systolic function is normal. The right ventricular size is normal.  3. The mitral valve was not well visualized. No evidence of mitral valve regurgitation.  4. The aortic valve was not well visualized. There is mild calcification of the aortic valve. Aortic valve regurgitation is not visualized. Comparison(s): No prior Echocardiogram. FINDINGS  Left Ventricle: Left ventricular ejection fraction, by estimation, is 70 to 75%. The left ventricle has hyperdynamic function. The left ventricle has no regional wall motion abnormalities. Definity contrast agent was given IV to delineate the left ventricular endocardial borders. The left ventricular internal cavity size was normal in size. There is mild asymmetric left ventricular hypertrophy of the basal-septal segment. Left ventricular diastolic parameters are consistent with Grade I diastolic dysfunction (impaired relaxation). Right Ventricle: The right ventricular size is normal. No increase in right ventricular wall thickness. Right ventricular systolic function is normal. Left Atrium: Left atrial size was normal in size. Right Atrium: Right atrial size was normal in size. Pericardium: The pericardium was not well visualized. Mitral Valve: The mitral valve was not well visualized. No  evidence of mitral valve regurgitation. Tricuspid Valve: The tricuspid valve is grossly normal. Tricuspid valve regurgitation is not demonstrated. No evidence of tricuspid stenosis. Aortic Valve: The aortic valve was not well visualized. There is mild calcification of the aortic valve. Aortic valve regurgitation is not visualized. Pulmonic Valve: The pulmonic valve was not well visualized. Pulmonic valve regurgitation is not visualized. No evidence of pulmonic stenosis. Aorta: The ascending aorta was not well visualized, the aortic arch was not well visualized and  the aortic root was not well visualized. IAS/Shunts: The interatrial septum was not well visualized.  LEFT VENTRICLE PLAX 2D LVIDd:         2.60 cm   Diastology LVIDs:         2.00 cm   LV e' medial:    4.24 cm/s LV PW:         0.90 cm   LV E/e' medial:  11.8 LV IVS:        1.10 cm   LV e' lateral:   7.29 cm/s LVOT diam:     1.70 cm   LV E/e' lateral: 6.9 LV SV:         33 LV SV Index:   21 LVOT Area:     2.27 cm  RIGHT VENTRICLE RV S prime:     26.90 cm/s TAPSE (M-mode): 1.7 cm LEFT ATRIUM           Index        RIGHT ATRIUM          Index LA diam:      2.00 cm 1.27 cm/m   RA Area:     8.06 cm LA Vol (A2C): 17.4 ml 11.04 ml/m  RA Volume:   12.70 ml 8.06 ml/m LA Vol (A4C): 13.5 ml 8.57 ml/m  AORTIC VALVE LVOT Vmax:   95.40 cm/s LVOT Vmean:  59.800 cm/s LVOT VTI:    0.146 m  AORTA Ao Root diam: 2.60 cm MITRAL VALVE                TRICUSPID VALVE MV Area (PHT): 1.27 cm     TR Peak grad:   28.7 mmHg MV Decel Time: 599 msec     TR Vmax:        268.00 cm/s MV E velocity: 50.10 cm/s MV A velocity: 106.00 cm/s  SHUNTS MV E/A ratio:  0.47         Systemic VTI:  0.15 m                             Systemic Diam: 1.70 cm Riley Lam MD Electronically signed by Riley Lam MD Signature Date/Time: 02/08/2023/5:04:37 PM    Final    US Carotid Bilateral  Result Date: 02/08/2023 CLINICAL DATA:  Stroke Fall from bed 2 days ago Hypertension Tobacco user Prior left endarterectomy EXAM: BILATERAL CAROTID DUPLEX ULTRASOUND TECHNIQUE: Wallace Cullens scale imaging, color Doppler and duplex ultrasound were performed of bilateral carotid and vertebral arteries in the neck. COMPARISON:  None available FINDINGS: Criteria: Quantification of carotid stenosis is based on velocity parameters that correlate the residual internal carotid diameter with NASCET-based stenosis levels, using the diameter of the distal internal carotid lumen as the denominator for stenosis measurement. The following velocity measurements were obtained:  RIGHT ICA: 205/57 cm/sec CCA: 46/16 cm/sec SYSTOLIC ICA/CCA RATIO:  4.5 ECA: 150 cm/sec LEFT ICA: 481/138 cm/sec CCA: 56/6 cm/sec SYSTOLIC ICA/CCA RATIO:  8.6 ECA: 45 cm/sec  RIGHT CAROTID ARTERY: Extensive calcified plaque of the right internal carotid artery origin. RIGHT VERTEBRAL ARTERY:  Antegrade flow. LEFT CAROTID ARTERY: Mild scattered atheromatous plaque of the common carotid artery. Minimal mixed echogenicity plaque of the left carotid bulb. LEFT VERTEBRAL ARTERY:  Antegrade flow. IMPRESSION: 1. Extensive atheromatous plaque of the right ICA origin with velocity parameters consistent with 50-69% stenosis. 2. Post endarterectomy changes of the left internal carotid artery are seen. Minimal mixed echogenicity plaque noted in the left carotid bulb. Significantly elevated velocity in the proximal left internal carotid artery suggestive of greater than 70% stenosis is discordant with the grayscale appearance of this segment. The exact degree of stenosis is difficult to determine given the discordant findings. 3. Given the discordant findings of the left internal carotid artery and the extensive atheromatous plaque of the right internal carotid artery, further evaluation with CT angiography of the neck should be performed for more precise quantification of stenosis. Electronically Signed   By: Acquanetta Belling M.D.   On: 02/08/2023 16:09   CT HEAD WO CONTRAST  Result Date: 02/07/2023 CLINICAL DATA:  Recent fall yesterday with increasing left-sided weakness, initial encounter EXAM: CT HEAD WITHOUT CONTRAST TECHNIQUE: Contiguous axial images were obtained from the base of the skull through the vertex without intravenous contrast. RADIATION DOSE REDUCTION: This exam was performed according to the departmental dose-optimization program which includes automated exposure control, adjustment of the mA and/or kV according to patient size and/or use of iterative reconstruction technique. COMPARISON:  01/24/2023 FINDINGS:  Brain: No acute hemorrhage or space-occupying mass lesion is noted. Generalized atrophic changes are seen. There is a geographic area of decreased attenuation identified the right temporoparietal region posteriorly consistent within the evolving infarct likely of a subacute nature in the degree of decreased attenuation. Scattered chronic white matter ischemic changes are noted. 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 Vascular: No hyperdense vessel or unexpected calcification. Skull: Normal. Negative for fracture or focal lesion. Sinuses/Orbits: No acute finding. Other: None. IMPRESSION: Geographic area of decreased attenuation in the right posterior parietal lobe extending inferiorly into the posterior temporal lobe consistent with subacute infarct. No focal abnormality is seen. Generalized atrophic changes are noted stable from the prior study. Electronically Signed   By: Alcide Clever M.D.   On: 02/07/2023 22:35    Microbiology: Results for orders placed or performed during the hospital encounter of 02/19/23  SARS Coronavirus 2 by RT PCR (hospital order, performed in [facility] hospital lab) *cepheid single result test* Anterior Nasal Swab     Status: None   Collection Time: 02/19/23  2:13 PM   Specimen: Anterior Nasal Swab  Result Value Ref Range Status   SARS Coronavirus 2 by RT PCR NEGATIVE NEGATIVE Final    Comment: (NOTE) SARS-CoV-2 target nucleic acids are NOT DETECTED.  The SARS-CoV-2 RNA is generally detectable in upper and lower respiratory specimens during the acute phase of infection. The lowest concentration of SARS-CoV-2 viral copies this assay can detect is 250 copies / mL. A negative result does not preclude SARS-CoV-2 infection and should not be used as the sole basis for treatment or other patient management decisions.  A negative result may occur with improper specimen collection / handling, submission of specimen other than nasopharyngeal swab, presence of viral  mutation(s) within the areas targeted by this assay, and inadequate number of viral copies (<250 copies / mL). A negative result must be combined with clinical observations, patient history, and epidemiological information.  Fact Sheet for Patients:  RoadLapTop.co.za  Fact Sheet for Healthcare Providers: http://kim-miller.com/  This test is not yet approved or  cleared by the [place] FDA and has been authorized for detection and/or diagnosis of SARS-CoV-2 by FDA under an Emergency Use Authorization (EUA).  This EUA will remain in effect (meaning this test can be used) for the duration of the COVID-19 declaration under Section 564(b)(1) of the Act, 21 U.S.C. section 360bbb-3(b)(1), unless the authorization is terminated or revoked sooner.  Performed at [facility], [address], [place], [place] [address]   Culture, blood (Routine X 2) w Reflex to ID Panel     Status: None   Collection Time: 02/19/23  9:30 PM   Specimen: BLOOD  Result Value Ref Range Status   Specimen Description BLOOD BLOOD RIGHT HAND  Final   Special Requests   Final    BOTTLES DRAWN AEROBIC ONLY Blood Culture results may not be optimal due to an inadequate volume of blood received in culture bottles   Culture   Final    NO GROWTH 6 DAYS Performed at [facility], [address], [place], [place] [address]    Report Status 02/25/2023 FINAL  Final  Urine Culture (for pregnant, neutropenic or urologic patients or patients with an indwelling urinary catheter)     Status: Abnormal   Collection Time: 02/19/23  9:30 PM   Specimen: Urine, Catheterized  Result Value Ref Range Status   Specimen Description   Final    URINE, CATHETERIZED Performed at [facility], [address], [place], [place] [address]    Special Requests   Final    NONE Performed at [facility], [address], [place], [place] [address]    Culture >=100,000 COLONIES/mL ESCHERICHIA COLI (A)   Final   Report Status 02/22/2023 FINAL  Final   Organism ID, Bacteria ESCHERICHIA COLI (A)  Final      Susceptibility   Escherichia coli - MIC*    AMPICILLIN >=32 RESISTANT Resistant     CEFAZOLIN <=4 SENSITIVE Sensitive     CEFEPIME <=0.12 SENSITIVE Sensitive     CEFTRIAXONE <=0.25 SENSITIVE Sensitive     CIPROFLOXACIN >=4 RESISTANT Resistant     GENTAMICIN <=1 SENSITIVE Sensitive     IMIPENEM <=0.25 SENSITIVE Sensitive     NITROFURANTOIN <=16 SENSITIVE Sensitive     TRIMETH/SULFA <=20 SENSITIVE Sensitive     AMPICILLIN/SULBACTAM 8 SENSITIVE Sensitive     PIP/TAZO <=4 SENSITIVE Sensitive     * >=100,000 COLONIES/mL ESCHERICHIA COLI  Culture, blood (Routine X 2) w Reflex to ID Panel     Status: Abnormal (Preliminary result)   Collection Time: 02/19/23 11:19 PM   Specimen: BLOOD  Result Value Ref Range Status   Specimen Description   Final    BLOOD LEFT ANTECUBITAL Performed at [facility], [address], [place], [place] [address]    Special Requests   Final    BOTTLES DRAWN AEROBIC AND ANAEROBIC Blood Culture results may not be optimal due to an excessive volume of blood received in culture bottles Performed at [facility], [address] [place], [place] [address]    Culture  Setup Time   Final    YEAST AEROBIC BOTTLE ONLY Gram Stain Report Called to,Read Back By and Verified With: L WALKER AT [facility] AT 0925 ON [id number] BY S [place] CRITICAL RESULT CALLED TO, READ BACK BY AND VERIFIED WITH: PHARMD JESSICA MILLEN [id number] AT 1414 BY EC    Culture (A)  Final    CANDIDA GLABRATA  Sent to [facility] for further susceptibility testing. Performed at [facility], [address], [place], [place] [address]    Report Status PENDING  Incomplete  Blood Culture ID Panel (Reflexed)     Status: Abnormal   Collection Time: 02/19/23 11:19 PM  Result Value Ref Range Status   Enterococcus faecalis NOT DETECTED NOT DETECTED Final   Enterococcus Faecium NOT DETECTED NOT DETECTED Final    Listeria monocytogenes NOT DETECTED NOT DETECTED Final   Staphylococcus species NOT DETECTED NOT DETECTED Final   Staphylococcus aureus (BCID) NOT DETECTED NOT DETECTED Final   Staphylococcus epidermidis NOT DETECTED NOT DETECTED Final   Staphylococcus lugdunensis NOT DETECTED NOT DETECTED Final   Streptococcus species NOT DETECTED NOT DETECTED Final   Streptococcus agalactiae NOT DETECTED NOT DETECTED Final   Streptococcus pneumoniae NOT DETECTED NOT DETECTED Final   Streptococcus pyogenes NOT DETECTED NOT DETECTED Final   A.calcoaceticus-baumannii NOT DETECTED NOT DETECTED Final   Bacteroides fragilis NOT DETECTED NOT DETECTED Final   Enterobacterales NOT DETECTED NOT DETECTED Final   Enterobacter cloacae complex NOT DETECTED NOT DETECTED Final   Escherichia coli NOT DETECTED NOT DETECTED Final   Klebsiella aerogenes NOT DETECTED NOT DETECTED Final   Klebsiella oxytoca NOT DETECTED NOT DETECTED Final   Klebsiella pneumoniae NOT DETECTED NOT DETECTED Final   Proteus species NOT DETECTED NOT DETECTED Final   Salmonella species NOT DETECTED NOT DETECTED Final   Serratia marcescens NOT DETECTED NOT DETECTED Final   Haemophilus influenzae NOT DETECTED NOT DETECTED Final   Neisseria meningitidis NOT DETECTED NOT DETECTED Final   Pseudomonas aeruginosa NOT DETECTED NOT DETECTED Final   Stenotrophomonas maltophilia NOT DETECTED NOT DETECTED Final   Candida albicans NOT DETECTED NOT DETECTED Final   Candida auris NOT DETECTED NOT DETECTED Final   Candida glabrata DETECTED (A) NOT DETECTED Final    Comment: CRITICAL RESULT CALLED TO, READ BACK BY AND VERIFIED WITH: PHARMD JESSICA MILLEN [id number] AT 1414 BY EC    Candida krusei NOT DETECTED NOT DETECTED Final   Candida parapsilosis NOT DETECTED NOT DETECTED Final   Candida tropicalis NOT DETECTED NOT DETECTED Final   Cryptococcus neoformans/gattii NOT DETECTED NOT DETECTED Final    Comment: Performed at [facility], [address], [place], [place] [address]  Culture, blood (Routine X 2) w Reflex to ID Panel     Status: None (Preliminary result)   Collection Time: 02/22/23 11:10 AM   Specimen: BLOOD LEFT ARM  Result Value Ref Range Status   Specimen Description BLOOD LEFT ARM  Final   Special Requests   Final    BOTTLES DRAWN AEROBIC AND ANAEROBIC Blood Culture adequate volume   Culture   Final    NO GROWTH 4 DAYS Performed at [facility], [address], [place], [place] [address]    Report Status PENDING  Incomplete  Culture, blood (Routine X 2) w Reflex to ID Panel     Status: None (Preliminary result)   Collection Time: 02/22/23 11:21 AM   Specimen: BLOOD RIGHT HAND  Result Value Ref Range Status   Specimen Description BLOOD RIGHT HAND  Final   Special Requests   Final    BOTTLES DRAWN AEROBIC ONLY Blood Culture results may not be optimal due to an inadequate volume of blood received in culture bottles   Culture   Final    NO GROWTH 4 DAYS Performed at [facility], [address] [place], [place] [address]  Report Status PENDING  Incomplete    Labs: CBC: Recent Labs  Lab 02/19/23 1415 02/19/23 1424 02/20/23 0642 02/21/23 0414 02/22/23 0446 02/23/23 0434 02/24/23 0711  WBC 29.2*  --  20.2* 11.2* 9.9 10.1 8.2  NEUTROABS 26.8*  --   --  9.4* 7.8* 7.7  --   HGB 9.5*   < > 7.6* 6.8* 12.4 12.2 13.5  HCT 28.1*   < > 23.9* 22.6* 37.5 36.3 39.7  MCV 102.9*  --  108.1* 109.7* 94.5 92.4 94.7  PLT 343  --  220 214 169 155 160   < > = values in this interval not displayed.   Basic Metabolic Panel: Recent Labs  Lab 02/21/23 0414 02/22/23 0446 02/23/23 0434 02/24/23 0711 02/25/23 1230  NA 143 142 133* 133* 135  K 2.9* 3.7 3.5 3.9 4.0  CL 112* 111 102 103 103  CO2 24 24 24  19* 22  GLUCOSE 140* 113* 87 108* 102*  BUN 20 13 12 9 10   CREATININE 0.77 0.83 0.85 0.72 0.85  CALCIUM 8.0* 8.1* 7.8* 8.2* 8.5*  MG 1.7 1.6* 1.9  --   --   PHOS 1.5*  --  2.1*  --  3.3   Liver Function  Tests: Recent Labs  Lab 02/19/23 1415 02/22/23 0446 02/23/23 0434 02/25/23 1230  AST 29 25 28   --   ALT 23 17 18   --   ALKPHOS 49 31* 38  --   BILITOT 1.0 0.4 0.9  --   PROT 6.2* 4.5* 4.6*  --   ALBUMIN 3.4* 2.0* 2.1* 2.3*   CBG: Recent Labs  Lab 02/25/23 0757 02/25/23 1719 02/26/23 0020 02/26/23 0731 02/26/23 1232  GLUCAP 102* 88 78 77 66*    Discharge time spent: greater than 30 minutes.  Signed: Meredeth Ide, MD [facility] 02/26/2023

## 2023-02-26 NOTE — TOC Transition Note (Addendum)
Transition of Care [facility]) - CM/SW Discharge Note   Patient Details  Name: [name] MRN: [id number] Date of Birth: 1945-07-07  Transition of [facility] CM/SW Contact:  Delilah Shan, LCSWA Phone Number: 02/26/2023, 2:14 PM   Clinical Narrative:     Patient will DC to: [facility] SNF  Anticipated DC date: 02/26/2023  Family notified: Cytogeneticist by: Sharin Mons  ?  Per MD patient ready for DC to [facility] with palliative services to follow . RN, patient, patient's family, Rae Halsted with [place] compassionate care, and facility notified of DC.Discharge Summary sent to facility. RN given number for report tele# [phone] [id number]. DC packet on chart.DNR signed by MD attached to patients DC packet .Ambulance transport requested for patient.  CSW signing off.    Final next level of care: Skilled Nursing Facility Barriers to Discharge: No Barriers Identified   Patient Goals and CMS Choice CMS Medicare.gov Compare Post Acute Care list provided to:: Patient Represenative (must comment) (Patients daughter [name]) Choice offered to / list presented to : Adult Children (patients daughter)  Discharge Placement                Patient chooses bed at:  [facility]) Patient to be transferred to facility by: [facility] Name of family member notified: [name] Patient and family notified of of transfer: 02/26/23  Discharge Plan and Services Additional resources added to the After Visit Summary for   In-house Referral: Clinical Social Work                                   Social Determinants of Health (SDOH) Interventions SDOH Screenings   Food Insecurity: No Food Insecurity (02/20/2023)  Housing: Low Risk  (02/20/2023)  Transportation Needs: No Transportation Needs (02/20/2023)  Utilities: Not At Risk (02/20/2023)  Tobacco Use: High Risk (02/19/2023)     Readmission Risk Interventions     No data to display

## 2023-02-27 DIAGNOSIS — K5190 Ulcerative colitis, unspecified, without complications: Secondary | ICD-10-CM | POA: Diagnosis not present

## 2023-02-27 DIAGNOSIS — I10 Essential (primary) hypertension: Secondary | ICD-10-CM | POA: Diagnosis not present

## 2023-02-27 DIAGNOSIS — M6281 Muscle weakness (generalized): Secondary | ICD-10-CM | POA: Diagnosis not present

## 2023-02-27 DIAGNOSIS — N390 Urinary tract infection, site not specified: Secondary | ICD-10-CM | POA: Diagnosis not present

## 2023-02-27 DIAGNOSIS — G894 Chronic pain syndrome: Secondary | ICD-10-CM | POA: Diagnosis not present

## 2023-02-27 DIAGNOSIS — K922 Gastrointestinal hemorrhage, unspecified: Secondary | ICD-10-CM | POA: Diagnosis not present

## 2023-02-27 DIAGNOSIS — K56600 Partial intestinal obstruction, unspecified as to cause: Secondary | ICD-10-CM | POA: Diagnosis not present

## 2023-02-27 LAB — CULTURE, BLOOD (ROUTINE X 2)
Culture: NO GROWTH
Culture: NO GROWTH
Special Requests: ADEQUATE

## 2023-02-28 LAB — MISC LABCORP TEST (SEND OUT)
LabCorp test name: 9
Labcorp test code: 183119

## 2023-03-02 DIAGNOSIS — K56600 Partial intestinal obstruction, unspecified as to cause: Secondary | ICD-10-CM | POA: Diagnosis not present

## 2023-03-02 DIAGNOSIS — K5190 Ulcerative colitis, unspecified, without complications: Secondary | ICD-10-CM | POA: Diagnosis not present

## 2023-03-02 DIAGNOSIS — I10 Essential (primary) hypertension: Secondary | ICD-10-CM | POA: Diagnosis not present

## 2023-03-02 DIAGNOSIS — G894 Chronic pain syndrome: Secondary | ICD-10-CM | POA: Diagnosis not present

## 2023-03-02 DIAGNOSIS — K922 Gastrointestinal hemorrhage, unspecified: Secondary | ICD-10-CM | POA: Diagnosis not present

## 2023-03-02 DIAGNOSIS — N390 Urinary tract infection, site not specified: Secondary | ICD-10-CM | POA: Diagnosis not present

## 2023-03-02 DIAGNOSIS — M6281 Muscle weakness (generalized): Secondary | ICD-10-CM | POA: Diagnosis not present

## 2023-03-04 DIAGNOSIS — N390 Urinary tract infection, site not specified: Secondary | ICD-10-CM | POA: Diagnosis not present

## 2023-03-04 DIAGNOSIS — K56600 Partial intestinal obstruction, unspecified as to cause: Secondary | ICD-10-CM | POA: Diagnosis not present

## 2023-03-04 DIAGNOSIS — I10 Essential (primary) hypertension: Secondary | ICD-10-CM | POA: Diagnosis not present

## 2023-03-04 DIAGNOSIS — G894 Chronic pain syndrome: Secondary | ICD-10-CM | POA: Diagnosis not present

## 2023-03-04 DIAGNOSIS — K922 Gastrointestinal hemorrhage, unspecified: Secondary | ICD-10-CM | POA: Diagnosis not present

## 2023-03-04 DIAGNOSIS — M6281 Muscle weakness (generalized): Secondary | ICD-10-CM | POA: Diagnosis not present

## 2023-03-04 DIAGNOSIS — K5190 Ulcerative colitis, unspecified, without complications: Secondary | ICD-10-CM | POA: Diagnosis not present

## 2023-03-05 DIAGNOSIS — Z515 Encounter for palliative care: Secondary | ICD-10-CM | POA: Diagnosis not present

## 2023-03-05 DIAGNOSIS — K922 Gastrointestinal hemorrhage, unspecified: Secondary | ICD-10-CM | POA: Diagnosis not present

## 2023-03-05 DIAGNOSIS — E871 Hypo-osmolality and hyponatremia: Secondary | ICD-10-CM | POA: Diagnosis not present

## 2023-03-05 DIAGNOSIS — I10 Essential (primary) hypertension: Secondary | ICD-10-CM | POA: Diagnosis not present

## 2023-03-05 DIAGNOSIS — I639 Cerebral infarction, unspecified: Secondary | ICD-10-CM | POA: Diagnosis not present

## 2023-03-05 DIAGNOSIS — K51919 Ulcerative colitis, unspecified with unspecified complications: Secondary | ICD-10-CM | POA: Diagnosis not present

## 2023-03-05 DIAGNOSIS — R7881 Bacteremia: Secondary | ICD-10-CM | POA: Diagnosis not present

## 2023-03-06 DIAGNOSIS — K56600 Partial intestinal obstruction, unspecified as to cause: Secondary | ICD-10-CM | POA: Diagnosis not present

## 2023-03-06 DIAGNOSIS — N390 Urinary tract infection, site not specified: Secondary | ICD-10-CM | POA: Diagnosis not present

## 2023-03-06 DIAGNOSIS — I10 Essential (primary) hypertension: Secondary | ICD-10-CM | POA: Diagnosis not present

## 2023-03-06 DIAGNOSIS — K922 Gastrointestinal hemorrhage, unspecified: Secondary | ICD-10-CM | POA: Diagnosis not present

## 2023-03-06 DIAGNOSIS — G894 Chronic pain syndrome: Secondary | ICD-10-CM | POA: Diagnosis not present

## 2023-03-06 DIAGNOSIS — K5190 Ulcerative colitis, unspecified, without complications: Secondary | ICD-10-CM | POA: Diagnosis not present

## 2023-03-06 DIAGNOSIS — M6281 Muscle weakness (generalized): Secondary | ICD-10-CM | POA: Diagnosis not present

## 2023-03-06 LAB — CULTURE, BLOOD (ROUTINE X 2)

## 2023-03-09 DIAGNOSIS — I10 Essential (primary) hypertension: Secondary | ICD-10-CM | POA: Diagnosis not present

## 2023-03-09 DIAGNOSIS — K922 Gastrointestinal hemorrhage, unspecified: Secondary | ICD-10-CM | POA: Diagnosis not present

## 2023-03-09 DIAGNOSIS — M6281 Muscle weakness (generalized): Secondary | ICD-10-CM | POA: Diagnosis not present

## 2023-03-09 DIAGNOSIS — K56600 Partial intestinal obstruction, unspecified as to cause: Secondary | ICD-10-CM | POA: Diagnosis not present

## 2023-03-09 DIAGNOSIS — K649 Unspecified hemorrhoids: Secondary | ICD-10-CM | POA: Diagnosis not present

## 2023-03-09 DIAGNOSIS — N390 Urinary tract infection, site not specified: Secondary | ICD-10-CM | POA: Diagnosis not present

## 2023-03-09 DIAGNOSIS — G894 Chronic pain syndrome: Secondary | ICD-10-CM | POA: Diagnosis not present

## 2023-03-09 DIAGNOSIS — K5190 Ulcerative colitis, unspecified, without complications: Secondary | ICD-10-CM | POA: Diagnosis not present

## 2023-03-11 ENCOUNTER — Encounter (HOSPITAL_COMMUNITY): Payer: Self-pay

## 2023-03-11 ENCOUNTER — Inpatient Hospital Stay (HOSPITAL_COMMUNITY)
Admission: EM | Admit: 2023-03-11 | Discharge: 2023-03-17 | DRG: 698 | Disposition: A | Discharge status: Home Health | Payer: Medicare Other | Source: Skilled Nursing Facility | Attending: Family Medicine | Admitting: Family Medicine

## 2023-03-11 ENCOUNTER — Emergency Department (HOSPITAL_COMMUNITY): Payer: Medicare Other

## 2023-03-11 ENCOUNTER — Other Ambulatory Visit: Payer: Self-pay

## 2023-03-11 DIAGNOSIS — E871 Hypo-osmolality and hyponatremia: Secondary | ICD-10-CM | POA: Diagnosis present

## 2023-03-11 DIAGNOSIS — G4489 Other headache syndrome: Secondary | ICD-10-CM | POA: Diagnosis not present

## 2023-03-11 DIAGNOSIS — Z743 Need for continuous supervision: Secondary | ICD-10-CM | POA: Diagnosis not present

## 2023-03-11 DIAGNOSIS — K922 Gastrointestinal hemorrhage, unspecified: Secondary | ICD-10-CM | POA: Diagnosis not present

## 2023-03-11 DIAGNOSIS — Z1629 Resistance to other single specified antibiotic: Secondary | ICD-10-CM | POA: Diagnosis present

## 2023-03-11 DIAGNOSIS — K56600 Partial intestinal obstruction, unspecified as to cause: Secondary | ICD-10-CM | POA: Diagnosis not present

## 2023-03-11 DIAGNOSIS — K219 Gastro-esophageal reflux disease without esophagitis: Secondary | ICD-10-CM | POA: Insufficient documentation

## 2023-03-11 DIAGNOSIS — F17210 Nicotine dependence, cigarettes, uncomplicated: Secondary | ICD-10-CM | POA: Diagnosis not present

## 2023-03-11 DIAGNOSIS — K861 Other chronic pancreatitis: Secondary | ICD-10-CM | POA: Diagnosis present

## 2023-03-11 DIAGNOSIS — K5000 Crohn's disease of small intestine without complications: Secondary | ICD-10-CM

## 2023-03-11 DIAGNOSIS — N390 Urinary tract infection, site not specified: Secondary | ICD-10-CM | POA: Diagnosis not present

## 2023-03-11 DIAGNOSIS — G9341 Metabolic encephalopathy: Secondary | ICD-10-CM | POA: Diagnosis not present

## 2023-03-11 DIAGNOSIS — M1990 Unspecified osteoarthritis, unspecified site: Secondary | ICD-10-CM | POA: Diagnosis present

## 2023-03-11 DIAGNOSIS — R54 Age-related physical debility: Secondary | ICD-10-CM | POA: Diagnosis present

## 2023-03-11 DIAGNOSIS — Z1611 Resistance to penicillins: Secondary | ICD-10-CM | POA: Diagnosis present

## 2023-03-11 DIAGNOSIS — E860 Dehydration: Secondary | ICD-10-CM | POA: Diagnosis not present

## 2023-03-11 DIAGNOSIS — K56609 Unspecified intestinal obstruction, unspecified as to partial versus complete obstruction: Secondary | ICD-10-CM | POA: Diagnosis not present

## 2023-03-11 DIAGNOSIS — Y731 Therapeutic (nonsurgical) and rehabilitative gastroenterology and urology devices associated with adverse incidents: Secondary | ICD-10-CM | POA: Diagnosis present

## 2023-03-11 DIAGNOSIS — B49 Unspecified mycosis: Secondary | ICD-10-CM | POA: Diagnosis not present

## 2023-03-11 DIAGNOSIS — R112 Nausea with vomiting, unspecified: Secondary | ICD-10-CM | POA: Diagnosis not present

## 2023-03-11 DIAGNOSIS — Z1619 Resistance to other specified beta lactam antibiotics: Secondary | ICD-10-CM | POA: Diagnosis not present

## 2023-03-11 DIAGNOSIS — Z8249 Family history of ischemic heart disease and other diseases of the circulatory system: Secondary | ICD-10-CM | POA: Diagnosis not present

## 2023-03-11 DIAGNOSIS — K560 Paralytic ileus: Secondary | ICD-10-CM | POA: Diagnosis present

## 2023-03-11 DIAGNOSIS — E538 Deficiency of other specified B group vitamins: Secondary | ICD-10-CM | POA: Diagnosis not present

## 2023-03-11 DIAGNOSIS — M6281 Muscle weakness (generalized): Secondary | ICD-10-CM | POA: Diagnosis not present

## 2023-03-11 DIAGNOSIS — Z7902 Long term (current) use of antithrombotics/antiplatelets: Secondary | ICD-10-CM

## 2023-03-11 DIAGNOSIS — I10 Essential (primary) hypertension: Secondary | ICD-10-CM | POA: Diagnosis not present

## 2023-03-11 DIAGNOSIS — Z881 Allergy status to other antibiotic agents status: Secondary | ICD-10-CM

## 2023-03-11 DIAGNOSIS — I69354 Hemiplegia and hemiparesis following cerebral infarction affecting left non-dominant side: Secondary | ICD-10-CM

## 2023-03-11 DIAGNOSIS — K649 Unspecified hemorrhoids: Secondary | ICD-10-CM | POA: Diagnosis not present

## 2023-03-11 DIAGNOSIS — K8689 Other specified diseases of pancreas: Secondary | ICD-10-CM | POA: Diagnosis not present

## 2023-03-11 DIAGNOSIS — K5730 Diverticulosis of large intestine without perforation or abscess without bleeding: Secondary | ICD-10-CM | POA: Diagnosis not present

## 2023-03-11 DIAGNOSIS — R109 Unspecified abdominal pain: Secondary | ICD-10-CM | POA: Diagnosis not present

## 2023-03-11 DIAGNOSIS — Z9049 Acquired absence of other specified parts of digestive tract: Secondary | ICD-10-CM

## 2023-03-11 DIAGNOSIS — B961 Klebsiella pneumoniae [K. pneumoniae] as the cause of diseases classified elsewhere: Secondary | ICD-10-CM | POA: Diagnosis present

## 2023-03-11 DIAGNOSIS — T83511A Infection and inflammatory reaction due to indwelling urethral catheter, initial encounter: Secondary | ICD-10-CM | POA: Diagnosis not present

## 2023-03-11 DIAGNOSIS — E876 Hypokalemia: Secondary | ICD-10-CM | POA: Diagnosis not present

## 2023-03-11 DIAGNOSIS — Z8673 Personal history of transient ischemic attack (TIA), and cerebral infarction without residual deficits: Secondary | ICD-10-CM

## 2023-03-11 DIAGNOSIS — K5190 Ulcerative colitis, unspecified, without complications: Secondary | ICD-10-CM | POA: Diagnosis not present

## 2023-03-11 DIAGNOSIS — Z79899 Other long term (current) drug therapy: Secondary | ICD-10-CM

## 2023-03-11 DIAGNOSIS — G894 Chronic pain syndrome: Secondary | ICD-10-CM | POA: Diagnosis not present

## 2023-03-11 DIAGNOSIS — Z96642 Presence of left artificial hip joint: Secondary | ICD-10-CM | POA: Diagnosis present

## 2023-03-11 DIAGNOSIS — Z823 Family history of stroke: Secondary | ICD-10-CM

## 2023-03-11 DIAGNOSIS — K5090 Crohn's disease, unspecified, without complications: Secondary | ICD-10-CM | POA: Diagnosis present

## 2023-03-11 LAB — CBC WITH DIFFERENTIAL/PLATELET
Abs Immature Granulocytes: 0.04 10*3/uL (ref 0.00–0.07)
Basophils Absolute: 0 10*3/uL (ref 0.0–0.1)
Basophils Relative: 0 %
Eosinophils Absolute: 0.1 10*3/uL (ref 0.0–0.5)
Eosinophils Relative: 1 %
HCT: 42.3 % (ref 36.0–46.0)
Hemoglobin: 14.2 g/dL (ref 12.0–15.0)
Immature Granulocytes: 0 %
Lymphocytes Relative: 16 %
Lymphs Abs: 1.6 10*3/uL (ref 0.7–4.0)
MCH: 31.5 pg (ref 26.0–34.0)
MCHC: 33.6 g/dL (ref 30.0–36.0)
MCV: 93.8 fL (ref 80.0–100.0)
Monocytes Absolute: 0.6 10*3/uL (ref 0.1–1.0)
Monocytes Relative: 6 %
Neutro Abs: 7.8 10*3/uL — ABNORMAL HIGH (ref 1.7–7.7)
Neutrophils Relative %: 77 %
Platelets: 289 10*3/uL (ref 150–400)
RBC: 4.51 MIL/uL (ref 3.87–5.11)
RDW: 15.8 % — ABNORMAL HIGH (ref 11.5–15.5)
WBC: 10.2 10*3/uL (ref 4.0–10.5)
nRBC: 0 % (ref 0.0–0.2)

## 2023-03-11 LAB — LACTIC ACID, PLASMA: Lactic Acid, Venous: 0.9 mmol/L (ref 0.5–1.9)

## 2023-03-11 LAB — URINALYSIS, ROUTINE W REFLEX MICROSCOPIC
Bilirubin Urine: NEGATIVE
Glucose, UA: NEGATIVE mg/dL
Ketones, ur: 20 mg/dL — AB
Nitrite: POSITIVE — AB
Protein, ur: 100 mg/dL — AB
Specific Gravity, Urine: 1.023 (ref 1.005–1.030)
WBC, UA: >50 WBC/hpf (ref 0–5)
pH: 5 (ref 5.0–8.0)

## 2023-03-11 LAB — COMPREHENSIVE METABOLIC PANEL
ALT: 19 U/L (ref 0–44)
AST: 41 U/L (ref 15–41)
Albumin: 3.5 g/dL (ref 3.5–5.0)
Alkaline Phosphatase: 68 U/L (ref 38–126)
Anion gap: 18 — ABNORMAL HIGH (ref 5–15)
BUN: 15 mg/dL (ref 8–23)
CO2: 23 mmol/L (ref 22–32)
Calcium: 9.4 mg/dL (ref 8.9–10.3)
Chloride: 93 mmol/L — ABNORMAL LOW (ref 98–111)
Creatinine, Ser: 0.76 mg/dL (ref 0.44–1.00)
GFR, Estimated: >60 mL/min (ref >60)
Glucose, Bld: 96 mg/dL (ref 70–99)
Potassium: 3.7 mmol/L (ref 3.5–5.1)
Sodium: 134 mmol/L — ABNORMAL LOW (ref 135–145)
Total Bilirubin: 1.7 mg/dL — ABNORMAL HIGH (ref 0.3–1.2)
Total Protein: 6.5 g/dL (ref 6.5–8.1)

## 2023-03-11 LAB — LIPASE, BLOOD: Lipase: 38 U/L (ref 11–51)

## 2023-03-11 MED ORDER — FENTANYL CITRATE PF 50 MCG/ML IJ SOSY
25.0000 ug | PREFILLED_SYRINGE | Freq: Once | INTRAMUSCULAR | Status: AC
  Administered 2023-03-11: 25 ug via INTRAVENOUS
  Filled 2023-03-11: qty 1

## 2023-03-11 MED ORDER — IOHEXOL 300 MG/ML  SOLN
100.0000 mL | Freq: Once | INTRAMUSCULAR | Status: AC | PRN
  Administered 2023-03-11: 100 mL via INTRAVENOUS

## 2023-03-11 MED ORDER — LACTATED RINGERS IV BOLUS
500.0000 mL | Freq: Once | INTRAVENOUS | Status: AC
  Administered 2023-03-11: 500 mL via INTRAVENOUS

## 2023-03-11 MED ORDER — SODIUM CHLORIDE 0.9 % IV SOLN
1.0000 g | Freq: Once | INTRAVENOUS | Status: AC
  Administered 2023-03-11: 1 g via INTRAVENOUS
  Filled 2023-03-11: qty 10

## 2023-03-11 NOTE — H&P (Signed)
History and Physical    Patient: [name] VWU:[id number] DOB: 1946/05/07 DOA: 03/11/2023 DOS: the patient was seen and examined on 03/11/2023 PCP: Assunta Found, MD  Patient coming from: SNF  Chief Complaint:  Chief Complaint  Patient presents with   Abdominal Pain   HPI: [name] is a 77 y.o. female with medical history significant of Crohn's disease, hypertension, stroke, and more presents the ED with a chief complaint of abdominal pain per chart review.  Upon my exam patient reports she does not know why she is here.  She reports nothing is bothering her right now except for her right arm where her IV is.  Per EMS patient was having abdominal pain since the day prior.  She had an x-ray done at her facility that showed moderate gaseous distention of the bowel loops compatible with adynamic ileus.  Patient has a Foley in place and her last bowel movement was Monday.  CT abdomen and pelvis done in the ER showed enterocolitis, resolved SBO, and chronic pancreatitis.  Patient was given Rocephin, fentanyl, LR 500 bolus in the ED.  No further history could be obtained at this time Review of Systems: unable to review all systems due to the inability of the patient to answer questions. Past Medical History:  Diagnosis Date   Anemia, unspecified    Arthritis    Crohn's disease (HCC)    Degenerative disc disease, lumbar    Hypertension    Sciatica of right side    Stroke [facility])    Ventral hernia    Past Surgical History:  Procedure Laterality Date   CAROTID ENDARTERECTOMY     2004   CHOLECYSTECTOMY     2008   ESOPHAGOGASTRODUODENOSCOPY N/A 02/24/2015   Procedure: ESOPHAGOGASTRODUODENOSCOPY (EGD);  Surgeon: Corbin Ade, MD;  Location: [facility] ENDO SUITE;  Service: Endoscopy;  Laterality: N/A;   HEMICOLECTOMY     right   HIP ARTHROPLASTY Left 04/09/2015   Procedure: INSERT PARTIAL HIP REPLACEMENT LEFT;  Surgeon: Vickki Hearing, MD;  Location: [facility] ORS;  Service: Orthopedics;   Laterality: Left;   INCISIONAL HERNIA REPAIR N/A 05/20/2013   Procedure: Sherald Hess HERNIORRHAPHY WITH MESH;  Surgeon: Dalia Heading, MD;  Location: [facility] ORS;  Service: General;  Laterality: N/A;   INCISIONAL HERNIA REPAIR N/A 05/19/2014   Procedure: RECURRENT HERNIA REPAIR INCISIONAL;  Surgeon: Dalia Heading, MD;  Location: [facility] ORS;  Service: General;  Laterality: N/A;   INSERTION OF MESH N/A 05/20/2013   Procedure: INSERTION OF MESH;  Surgeon: Dalia Heading, MD;  Location: [facility] ORS;  Service: General;  Laterality: N/A;   INSERTION OF MESH N/A 05/19/2014   Procedure: INSERTION OF MESH;  Surgeon: Dalia Heading, MD;  Location: [facility] ORS;  Service: General;  Laterality: N/A;   TUBAL LIGATION     Social History:  reports that she has been smoking cigarettes. She has a 45 pack-year smoking history. She has never used smokeless tobacco. She reports that she does not drink alcohol and does not use drugs.  Allergies  Allergen Reactions   Flagyl [Metronidazole Hcl] Other (See Comments)    "makes me feel whoozy and weird"   Other     Unknown antibiotic and unknown pain medications     Family History  Problem Relation Age of Onset   GI problems Father    Stroke Brother    Heart attack Mother    Hypotension Sister     Prior to Admission medications   Medication  Sig Start Date End Date Taking? Authorizing Provider  acetaminophen (TYLENOL) 500 MG tablet Take 1 tablet (500 mg total) by mouth every 6 (six) hours as needed for mild pain. 04/12/15   Standley Brooking, MD  clopidogrel (PLAVIX) 75 MG tablet Take 1 tablet (75 mg total) by mouth daily. 02/14/23   Rhetta Mura, MD  metoprolol succinate (TOPROL XL) 25 MG 24 hr tablet Take 0.5 tablets (12.5 mg total) by mouth daily. 02/13/23 02/13/24  Rhetta Mura, MD  nicotine (NICODERM CQ - DOSED IN MG/24 HOURS) 21 mg/24hr patch Place 1 patch (21 mg total) onto the skin daily. 02/14/23   Rhetta Mura, MD  oxyCODONE (ROXICODONE) 5 MG  immediate release tablet Take 1 tablet (5 mg total) by mouth every 8 (eight) hours as needed. 02/26/23   Meredeth Ide, MD  pantoprazole (PROTONIX) 40 MG tablet Take 1 tablet (40 mg total) by mouth 2 (two) times daily before a meal. 02/26/23   Meredeth Ide, MD  sodium bicarbonate 650 MG tablet Take 1 tablet (650 mg total) by mouth 2 (two) times daily. 02/13/23   Rhetta Mura, MD  sulfaSALAzine (AZULFIDINE) 500 MG tablet TAKE (2) TABLETS BY MOUTH TWICE DAILY. 11/13/22   Carlan, Chelsea L, NP  urea (URE-NA) 15 g PACK oral packet Take 15 g by mouth 2 (two) times daily. 02/13/23   Rhetta Mura, MD    Physical Exam: Vitals:   03/11/23 1756 03/11/23 1759 03/11/23 1945  BP: 124/86  (!) 169/97  Pulse: (!) 108  (!) 110  Resp: 20  20  Temp: 98.1 F (36.7 C)  98.3 F (36.8 C)  TempSrc: Oral    SpO2: 92%  94%  Weight:  52.1 kg   Height:  5\' 5"  (1.651 m)    1.  General: Patient lying supine in bed,  no acute distress   2. Psychiatric: Alert and oriented x self and birthday, mood and behavior normal for situation, pleasant and cooperative with exam   3. Neurologic: Speech and language are normal, face is symmetric, moves all 4 extremities voluntarily, at baseline without acute deficits on limited exam   4. HEENMT:  Head is atraumatic, normocephalic, pupils reactive to light, neck is supple, trachea is midline, mucous membranes are moist   5. Respiratory : Lungs are clear to auscultation bilaterally without wheezing, rhonchi, rales, no cyanosis, no increase in work of breathing or accessory muscle use   6. Cardiovascular : Heart rate tachy, rhythm is regular, no murmurs, rubs or gallops, no peripheral edema, peripheral pulses palpated   7. Gastrointestinal:  Abdomen is soft, nondistended, nontender to palpation bowel sounds active, no masses or organomegaly palpated   8. Skin:  Skin is warm, dry and intact without rashes, acute lesions, or ulcers on limited exam    9.Musculoskeletal:  No acute deformities or trauma, no asymmetry in tone, no peripheral edema, peripheral pulses palpated, no tenderness to palpation in the extremities  Data Reviewed: In the ED Afebrile, tachycardic, normal respiratory rate, blood pressure ranging from 124/86-269/97, satting 92% - 94% No leukocytosis, hemoglobin stable Chemistry unremarkable Lactic acid 0.9 UA indicative of UTI CT abdomen pelvis shows enterocolitis, resolved SBO, chronic pancreatitis Patient was given Rocephin, fentanyl, LR 500 Chart was reviewed from last admission where patient had been discharged with a PICC line for treatment of fungemia with micafungin for 2 weeks Admission requested for acute metabolic encephalopathy likely secondary to UTI  Assessment and Plan: * Acute metabolic encephalopathy - Unclear baseline but per  last discharge summary patient was able to provide her own history - Oriented to self and birthdate, but not to current date or context - Likely related to UTI - Continue treatment for UTI - No acute neurodeficits on exam - Continue to monitor  Crohn's disease (HCC) - Continue sulfasalazine  History of stroke - Continue Plavix  GERD (gastroesophageal reflux disease) - Continue Protonix  Fungemia - Recently discharged on 02/26/2023 on 2 weeks of micafungin - Looks like that finished on 9/22 - Repeat blood cultures - Continue to monitor  Lower urinary tract infectious disease - UA indicative of UTI - Previous cultures grew Candida glabrata and E. coli that sensitive to ceftriaxone - Continue ceftriaxone - Pharmacy consult regarding previous treatment for Candida glabrata - Continue to monitor  HTN (hypertension) - Continue metoprolol 12.5 mg      Advance Care Planning:   Code Status: Prior  Consults: None at this time  Family Communication: No family at bedside  Severity of Illness: The appropriate patient status for this patient is OBSERVATION.  Observation status is judged to be reasonable and necessary in order to provide the required intensity of service to ensure the patient's safety. The patient's presenting symptoms, physical exam findings, and initial radiographic and laboratory data in the context of their medical condition is felt to place them at decreased risk for further clinical deterioration. Furthermore, it is anticipated that the patient will be medically stable for discharge from the hospital within 2 midnights of admission.   Author: Lilyan Gilford, DO 03/11/2023 11:01 PM  For on call review [email or web address].

## 2023-03-11 NOTE — ED Provider Notes (Signed)
[place] EMERGENCY DEPARTMENT AT [facility] Provider Note   CSN: [id number] Arrival date & time: 03/11/23  1736     History  Chief Complaint  Patient presents with   Abdominal Pain    [name] is a 77 y.o. female.  History of Crohn's disease, bowel obstruction, UTI sepsis, stroke with left-sided hemiparesis, hypertension, aortic dissection and fungemia.  She presents the ER today from nursing facility for abdominal pain with abnormal abdominal x-ray showing possible ileus from SNF.  No fever or chills, she is on micafungin for her fungemia through PICC line in her right arm.  At the time of my evaluation she states she is no longer having abdominal pain but does have a headache. She is not certain why she is in the ER.  Daughter [name] is her emergency contact.  I spoke with her on the phone as well, states nursing home was concerned about decreased p.o. intake as she has not been eating or drinking well over the past couple of days   Abdominal Pain      Home Medications Prior to Admission medications   Medication Sig Start Date End Date Taking? Authorizing Provider  acetaminophen (TYLENOL) 500 MG tablet Take 1 tablet (500 mg total) by mouth every 6 (six) hours as needed for mild pain. 04/12/15   Standley Brooking, MD  clopidogrel (PLAVIX) 75 MG tablet Take 1 tablet (75 mg total) by mouth daily. 02/14/23   Rhetta Mura, MD  metoprolol succinate (TOPROL XL) 25 MG 24 hr tablet Take 0.5 tablets (12.5 mg total) by mouth daily. 02/13/23 02/13/24  Rhetta Mura, MD  nicotine (NICODERM CQ - DOSED IN MG/24 HOURS) 21 mg/24hr patch Place 1 patch (21 mg total) onto the skin daily. 02/14/23   Rhetta Mura, MD  oxyCODONE (ROXICODONE) 5 MG immediate release tablet Take 1 tablet (5 mg total) by mouth every 8 (eight) hours as needed. 02/26/23   Meredeth Ide, MD  pantoprazole (PROTONIX) 40 MG tablet Take 1 tablet (40 mg total) by mouth 2 (two) times daily before a  meal. 02/26/23   Meredeth Ide, MD  sodium bicarbonate 650 MG tablet Take 1 tablet (650 mg total) by mouth 2 (two) times daily. 02/13/23   Rhetta Mura, MD  sulfaSALAzine (AZULFIDINE) 500 MG tablet TAKE (2) TABLETS BY MOUTH TWICE DAILY. 11/13/22   Carlan, Chelsea L, NP  urea (URE-NA) 15 g PACK oral packet Take 15 g by mouth 2 (two) times daily. 02/13/23   Rhetta Mura, MD      Allergies    Flagyl [metronidazole hcl] and Other    Review of Systems   Review of Systems  Gastrointestinal:  Positive for abdominal pain.    Physical Exam Updated Vital Signs BP (!) 169/97   Pulse (!) 110   Temp 98.3 F (36.8 C)   Resp 20   Ht 5\' 5"  (1.651 m)   Wt 52.1 kg   SpO2 94%   BMI 19.10 kg/m  Physical Exam Vitals and nursing note reviewed.  Constitutional:      General: She is not in acute distress.    Appearance: She is well-developed.  HENT:     Head: Normocephalic and atraumatic.     Mouth/Throat:     Mouth: Mucous membranes are dry.  Eyes:     Conjunctiva/sclera: Conjunctivae normal.  Cardiovascular:     Rate and Rhythm: Regular rhythm. Tachycardia present.     Heart sounds: No murmur heard. Pulmonary:  Effort: Pulmonary effort is normal. No respiratory distress.     Breath sounds: Normal breath sounds.  Abdominal:     Palpations: Abdomen is soft.     Tenderness: There is no abdominal tenderness.  Musculoskeletal:        General: No swelling.     Cervical back: Neck supple.  Skin:    General: Skin is warm and dry.     Capillary Refill: Capillary refill takes less than 2 seconds.  Neurological:     Mental Status: She is alert.  Psychiatric:        Mood and Affect: Mood normal.     ED Results / Procedures / Treatments   Labs (all labs ordered are listed, but only abnormal results are displayed) Labs Reviewed  CBC WITH DIFFERENTIAL/PLATELET - Abnormal; Notable for the following components:      Result Value   RDW 15.8 (*)    Neutro Abs 7.8 (*)    All  other components within normal limits  COMPREHENSIVE METABOLIC PANEL - Abnormal; Notable for the following components:   Sodium 134 (*)    Chloride 93 (*)    Total Bilirubin 1.7 (*)    Anion gap 18 (*)    All other components within normal limits  URINALYSIS, ROUTINE W REFLEX MICROSCOPIC - Abnormal; Notable for the following components:   Color, Urine AMBER (*)    APPearance CLOUDY (*)    Hgb urine dipstick SMALL (*)    Ketones, ur 20 (*)    Protein, ur 100 (*)    Nitrite POSITIVE (*)    Leukocytes,Ua MODERATE (*)    Bacteria, UA MANY (*)    All other components within normal limits  URINE CULTURE  CULTURE, BLOOD (ROUTINE X 2)  CULTURE, BLOOD (ROUTINE X 2)  LIPASE, BLOOD  LACTIC ACID, PLASMA    EKG None  Radiology CT ABDOMEN PELVIS W CONTRAST  Result Date: 03/11/2023 CLINICAL DATA:  Acute nonlocalized abdominal pain EXAM: CT ABDOMEN AND PELVIS WITH CONTRAST TECHNIQUE: Multidetector CT imaging of the abdomen and pelvis was performed using the standard protocol following bolus administration of intravenous contrast. RADIATION DOSE REDUCTION: This exam was performed according to the departmental dose-optimization program which includes automated exposure control, adjustment of the mA and/or kV according to patient size and/or use of iterative reconstruction technique. CONTRAST:  OMNIPAQUE IOHEXOL 300 MG/ML  SOLN COMPARISON:  02/19/2023 FINDINGS: Lower chest: No acute abnormality. Hepatobiliary: No focal liver abnormality is seen. Status post cholecystectomy. No biliary dilatation. Pancreas: Extensive calcification within the head of the pancreas in keeping with changes of chronic pancreatitis in this location. There is preservation of the pancreatic parenchyma within the body and tail the pancreas. The pancreatic duct is not dilated. No peripancreatic inflammatory changes are identified to suggest acute pancreatitis Spleen: Unremarkable Adrenals/Urinary Tract: The adrenal glands are  unremarkable. Tiny cortical hypodensities are seen within the kidneys bilaterally which are too small to accurately characterize but likely represent multiple cortical cysts. No follow-up imaging is recommended for these lesions. The kidneys are otherwise unremarkable. Foley catheter balloon seen within a decompressed bladder lumen. Stomach/Bowel: Previously noted small bowel obstruction has resolved. Moderate sigmoid diverticulosis. There is fluid seen within the distal colon and rectal vault in keeping with a diarrheal state and possibly reflective of underlying enterocolitis. Surgical changes of ileocecectomy are identified. The stomach, small bowel, and large bowel are otherwise unremarkable there is no evidence of obstruction or focal inflammation. No free intraperitoneal gas or fluid. Vascular/Lymphatic: Aortic  atherosclerosis. No enlarged abdominal or pelvic lymph nodes. Reproductive: Uterus and bilateral adnexa are unremarkable. Other: No abdominal wall hernia or abnormality. No abdominopelvic ascites. Musculoskeletal: Left total hip arthroplasty. Osseous structures are diffusely osteopenic. No acute bone abnormality. IMPRESSION: 1. Fluid seen within the distal colon and rectal vault in keeping with a diarrheal state and possibly reflective of underlying enterocolitis. 2. Previously noted small bowel obstruction has resolved. 3. Moderate sigmoid diverticulosis. 4. Changes of chronic pancreatitis. No CT evidence of acute pancreatitis. Aortic Atherosclerosis (ICD10-I70.0). Electronically Signed   By: Helyn Numbers M.D.   On: 03/11/2023 21:36    Procedures Procedures    Medications Ordered in ED Medications  fentaNYL (SUBLIMAZE) injection 25 mcg (25 mcg Intravenous Given 03/11/23 2024)  cefTRIAXone (ROCEPHIN) 1 g in sodium chloride 0.9 % 100 mL IVPB (0 g Intravenous Stopped 03/11/23 2221)  lactated ringers bolus 500 mL (500 mLs Intravenous New Bag/Given 03/11/23 2223)  iohexol (OMNIPAQUE) 300 MG/ML  solution 100 mL (100 mLs Intravenous Contrast Given 03/11/23 2101)    ED Course/ Medical Decision Making/ A&P Clinical Course as of 03/11/23 2258  Wed Mar 11, 2023  22234 A frail 77 year old female who presents the ER for decreased p.o. intake, she is tachycardic to around 110, appears slightly dehydrated, concern was for possible ileus versus bowel obstruction, CT was established does have significant UTI, some mild confusion from her baseline as she was unable to get a correct age and was not certain why she was in the ER.  Given Rocephin for UTI will consult hospitalist for admission [CB]    Clinical Course User Index [CB] Ma Rings, PA-C                                 Medical Decision Making This patient presents to the ED for concern of abdominal pain, decreased p.o. intake, this involves an extensive number of treatment options, and is a complaint that carries with it a high risk of complications and morbidity.  The differential diagnosis includes obstruction, UTI, sepsis, other   Co morbidities that complicate the patient evaluation :   Fungemia, CVA, Crohn's disease   Additional history obtained:  Additional history obtained from EMR External records from outside source obtained and reviewed including notes, labs   Lab Tests:  I Ordered, and personally interpreted labs.  The pertinent results include: Denies significant UTI, CMP has mildly elevated bilirubin and anion gap of 18, CO2 is normal, slightly decreased sodium and chloride, CBC shows no leukocytosis or anemia   Imaging Studies ordered:  I ordered imaging studies including CT abdomen pelvis which shows pancreatic calcification, no renal stones, no bowel obstruction I independently visualized and interpreted imaging within scope of identifying emergent findings  I agree with the radiologist interpretation   Cardiac Monitoring: / EKG:  The patient was maintained on a cardiac monitor.  I personally viewed  and interpreted the cardiac monitored which showed an underlying rhythm of: Sinus tachycardia   Consultations Obtained:  I requested consultation with the hospitalist ,  and discussed lab and imaging findings as well as pertinent plan - they recommend: admission, add blood cultures   Problem List / ED Course / Critical interventions / Medication management  Here for decreased p.o. intake, noted to be mild tachycardic, seems to be slightly confused from her baseline per her daughter's report.  Patient was able to tell me exactly why she was here or why  she had PICC line, told me her age was 21 when she is in reality 77 years old.  No abdominal tenderness on exam, but appears slightly dehydrated, dry mucous membranes.  Noted to have significant UTI, also has increased anion gap with normal CO2, normal lactate, patient ordered IV fluids, Rocephin for her UTI and discussed with hospitalist for admission  I have reviewed the patients home medicines and have made adjustments as needed     Amount and/or Complexity of Data Reviewed Independent Historian:     Details: Gust with patient's daughter [name] who requested we keep her in the hospital if at all possible External Data Reviewed: labs and notes. Labs: ordered. Radiology: ordered.  Risk Prescription drug management. Decision regarding hospitalization.           Final Clinical Impression(s) / ED Diagnoses Final diagnoses:  Urinary tract infection without hematuria, site unspecified    Rx / DC Orders ED Discharge Orders     None         Josem Kaufmann 03/11/23 2259    Cathren Laine, MD 03/12/23 [phone]

## 2023-03-11 NOTE — ED Triage Notes (Signed)
Pt bib [facility] from [facility]. Pt states she has been having ABD pain since yesterday. Pt had an x-ray done at the facility and they sent her here d/t the results, "moderate gaseous distention of the bowel loops compatible with adynamic ileus". Pt had a BM on Monday, pt does have a foley in place. Pt states she has a headache today.

## 2023-03-11 NOTE — ED Notes (Signed)
Patient transported to CT 

## 2023-03-12 DIAGNOSIS — Y731 Therapeutic (nonsurgical) and rehabilitative gastroenterology and urology devices associated with adverse incidents: Secondary | ICD-10-CM | POA: Diagnosis present

## 2023-03-12 DIAGNOSIS — K5000 Crohn's disease of small intestine without complications: Secondary | ICD-10-CM | POA: Diagnosis not present

## 2023-03-12 DIAGNOSIS — Z1611 Resistance to penicillins: Secondary | ICD-10-CM | POA: Diagnosis present

## 2023-03-12 DIAGNOSIS — Z9049 Acquired absence of other specified parts of digestive tract: Secondary | ICD-10-CM | POA: Diagnosis not present

## 2023-03-12 DIAGNOSIS — Z8673 Personal history of transient ischemic attack (TIA), and cerebral infarction without residual deficits: Secondary | ICD-10-CM

## 2023-03-12 DIAGNOSIS — I10 Essential (primary) hypertension: Secondary | ICD-10-CM | POA: Diagnosis not present

## 2023-03-12 DIAGNOSIS — E860 Dehydration: Secondary | ICD-10-CM | POA: Diagnosis present

## 2023-03-12 DIAGNOSIS — R109 Unspecified abdominal pain: Secondary | ICD-10-CM | POA: Diagnosis present

## 2023-03-12 DIAGNOSIS — R54 Age-related physical debility: Secondary | ICD-10-CM | POA: Diagnosis present

## 2023-03-12 DIAGNOSIS — Z7902 Long term (current) use of antithrombotics/antiplatelets: Secondary | ICD-10-CM | POA: Diagnosis not present

## 2023-03-12 DIAGNOSIS — M7989 Other specified soft tissue disorders: Secondary | ICD-10-CM | POA: Diagnosis not present

## 2023-03-12 DIAGNOSIS — R531 Weakness: Secondary | ICD-10-CM | POA: Diagnosis not present

## 2023-03-12 DIAGNOSIS — G9341 Metabolic encephalopathy: Secondary | ICD-10-CM | POA: Diagnosis present

## 2023-03-12 DIAGNOSIS — M25532 Pain in left wrist: Secondary | ICD-10-CM | POA: Diagnosis not present

## 2023-03-12 DIAGNOSIS — Z8249 Family history of ischemic heart disease and other diseases of the circulatory system: Secondary | ICD-10-CM | POA: Diagnosis not present

## 2023-03-12 DIAGNOSIS — E538 Deficiency of other specified B group vitamins: Secondary | ICD-10-CM | POA: Diagnosis present

## 2023-03-12 DIAGNOSIS — B961 Klebsiella pneumoniae [K. pneumoniae] as the cause of diseases classified elsewhere: Secondary | ICD-10-CM | POA: Diagnosis present

## 2023-03-12 DIAGNOSIS — K219 Gastro-esophageal reflux disease without esophagitis: Secondary | ICD-10-CM | POA: Diagnosis present

## 2023-03-12 DIAGNOSIS — T83511A Infection and inflammatory reaction due to indwelling urethral catheter, initial encounter: Secondary | ICD-10-CM | POA: Diagnosis present

## 2023-03-12 DIAGNOSIS — N390 Urinary tract infection, site not specified: Secondary | ICD-10-CM | POA: Diagnosis present

## 2023-03-12 DIAGNOSIS — K560 Paralytic ileus: Secondary | ICD-10-CM | POA: Diagnosis present

## 2023-03-12 DIAGNOSIS — I69354 Hemiplegia and hemiparesis following cerebral infarction affecting left non-dominant side: Secondary | ICD-10-CM | POA: Diagnosis not present

## 2023-03-12 DIAGNOSIS — Z1629 Resistance to other single specified antibiotic: Secondary | ICD-10-CM | POA: Diagnosis present

## 2023-03-12 DIAGNOSIS — K5090 Crohn's disease, unspecified, without complications: Secondary | ICD-10-CM | POA: Diagnosis present

## 2023-03-12 DIAGNOSIS — F17210 Nicotine dependence, cigarettes, uncomplicated: Secondary | ICD-10-CM | POA: Diagnosis present

## 2023-03-12 DIAGNOSIS — M1812 Unilateral primary osteoarthritis of first carpometacarpal joint, left hand: Secondary | ICD-10-CM | POA: Diagnosis not present

## 2023-03-12 DIAGNOSIS — E871 Hypo-osmolality and hyponatremia: Secondary | ICD-10-CM | POA: Diagnosis present

## 2023-03-12 DIAGNOSIS — Z7401 Bed confinement status: Secondary | ICD-10-CM | POA: Diagnosis not present

## 2023-03-12 DIAGNOSIS — Z1619 Resistance to other specified beta lactam antibiotics: Secondary | ICD-10-CM | POA: Diagnosis present

## 2023-03-12 DIAGNOSIS — E876 Hypokalemia: Secondary | ICD-10-CM | POA: Diagnosis not present

## 2023-03-12 DIAGNOSIS — R5381 Other malaise: Secondary | ICD-10-CM | POA: Diagnosis not present

## 2023-03-12 DIAGNOSIS — K861 Other chronic pancreatitis: Secondary | ICD-10-CM | POA: Diagnosis present

## 2023-03-12 DIAGNOSIS — M1990 Unspecified osteoarthritis, unspecified site: Secondary | ICD-10-CM | POA: Diagnosis present

## 2023-03-12 LAB — CBC WITH DIFFERENTIAL/PLATELET
Abs Immature Granulocytes: 0.04 10*3/uL (ref 0.00–0.07)
Basophils Absolute: 0.1 10*3/uL (ref 0.0–0.1)
Basophils Relative: 1 %
Eosinophils Absolute: 0.2 10*3/uL (ref 0.0–0.5)
Eosinophils Relative: 2 %
HCT: 36.2 % (ref 36.0–46.0)
Hemoglobin: 11.7 g/dL — ABNORMAL LOW (ref 12.0–15.0)
Immature Granulocytes: 0 %
Lymphocytes Relative: 18 %
Lymphs Abs: 1.6 10*3/uL (ref 0.7–4.0)
MCH: 31 pg (ref 26.0–34.0)
MCHC: 32.3 g/dL (ref 30.0–36.0)
MCV: 96 fL (ref 80.0–100.0)
Monocytes Absolute: 0.7 10*3/uL (ref 0.1–1.0)
Monocytes Relative: 8 %
Neutro Abs: 6.7 10*3/uL (ref 1.7–7.7)
Neutrophils Relative %: 71 %
Platelets: 237 10*3/uL (ref 150–400)
RBC: 3.77 MIL/uL — ABNORMAL LOW (ref 3.87–5.11)
RDW: 15.9 % — ABNORMAL HIGH (ref 11.5–15.5)
WBC: 9.3 10*3/uL (ref 4.0–10.5)
nRBC: 0 % (ref 0.0–0.2)

## 2023-03-12 LAB — COMPREHENSIVE METABOLIC PANEL
ALT: 17 U/L (ref 0–44)
AST: 32 U/L (ref 15–41)
Albumin: 3 g/dL — ABNORMAL LOW (ref 3.5–5.0)
Alkaline Phosphatase: 57 U/L (ref 38–126)
Anion gap: 13 (ref 5–15)
BUN: 15 mg/dL (ref 8–23)
CO2: 25 mmol/L (ref 22–32)
Calcium: 8.8 mg/dL — ABNORMAL LOW (ref 8.9–10.3)
Chloride: 95 mmol/L — ABNORMAL LOW (ref 98–111)
Creatinine, Ser: 0.68 mg/dL (ref 0.44–1.00)
GFR, Estimated: >60 mL/min (ref >60)
Glucose, Bld: 90 mg/dL (ref 70–99)
Potassium: 3.4 mmol/L — ABNORMAL LOW (ref 3.5–5.1)
Sodium: 133 mmol/L — ABNORMAL LOW (ref 135–145)
Total Bilirubin: 1 mg/dL (ref 0.3–1.2)
Total Protein: 5.7 g/dL — ABNORMAL LOW (ref 6.5–8.1)

## 2023-03-12 LAB — CULTURE, BLOOD (ROUTINE X 2): Special Requests: ADEQUATE

## 2023-03-12 LAB — MAGNESIUM: Magnesium: 1.8 mg/dL (ref 1.7–2.4)

## 2023-03-12 MED ORDER — SULFASALAZINE 500 MG PO TABS
500.0000 mg | ORAL_TABLET | Freq: Two times a day (BID) | ORAL | Status: DC
  Administered 2023-03-12 – 2023-03-17 (×10): 500 mg via ORAL
  Filled 2023-03-12 (×13): qty 1

## 2023-03-12 MED ORDER — KCL IN DEXTROSE-NACL 40-5-0.45 MEQ/L-%-% IV SOLN: INTRAVENOUS | Status: AC

## 2023-03-12 MED ORDER — OXYCODONE HCL 5 MG PO TABS
5.0000 mg | ORAL_TABLET | ORAL | Status: DC | PRN
  Administered 2023-03-12 – 2023-03-16 (×10): 5 mg via ORAL
  Filled 2023-03-12 (×10): qty 1

## 2023-03-12 MED ORDER — METOPROLOL SUCCINATE ER 25 MG PO TB24
12.5000 mg | ORAL_TABLET | Freq: Every day | ORAL | Status: DC
  Administered 2023-03-12 – 2023-03-17 (×6): 12.5 mg via ORAL
  Filled 2023-03-12 (×6): qty 1

## 2023-03-12 MED ORDER — SODIUM CHLORIDE 0.9 % IV SOLN
100.0000 mg | INTRAVENOUS | Status: DC
  Filled 2023-03-12: qty 5

## 2023-03-12 MED ORDER — ENSURE ENLIVE PO LIQD
237.0000 mL | Freq: Two times a day (BID) | ORAL | Status: DC
  Administered 2023-03-12 – 2023-03-17 (×8): 237 mL via ORAL

## 2023-03-12 MED ORDER — SODIUM BICARBONATE 650 MG PO TABS
650.0000 mg | ORAL_TABLET | Freq: Two times a day (BID) | ORAL | Status: DC
  Administered 2023-03-12 – 2023-03-17 (×11): 650 mg via ORAL
  Filled 2023-03-12 (×11): qty 1

## 2023-03-12 MED ORDER — LACTATED RINGERS IV SOLN: INTRAVENOUS | Status: DC

## 2023-03-12 MED ORDER — ONDANSETRON HCL 4 MG PO TABS: 4.0000 mg | ORAL_TABLET | Freq: Four times a day (QID) | ORAL | Status: DC | PRN

## 2023-03-12 MED ORDER — CHLORHEXIDINE GLUCONATE CLOTH 2 % EX PADS: 6.0000 | MEDICATED_PAD | Freq: Every day | CUTANEOUS | Status: DC

## 2023-03-12 MED ORDER — SODIUM CHLORIDE 0.9 % IV SOLN
1.0000 g | INTRAVENOUS | Status: DC
  Administered 2023-03-12: 1 g via INTRAVENOUS
  Filled 2023-03-12: qty 10

## 2023-03-12 MED ORDER — ACETAMINOPHEN 325 MG PO TABS
650.0000 mg | ORAL_TABLET | Freq: Four times a day (QID) | ORAL | Status: DC | PRN
  Administered 2023-03-14 – 2023-03-15 (×2): 650 mg via ORAL
  Filled 2023-03-12 (×2): qty 2

## 2023-03-12 MED ORDER — PANTOPRAZOLE SODIUM 40 MG PO TBEC
40.0000 mg | DELAYED_RELEASE_TABLET | Freq: Two times a day (BID) | ORAL | Status: DC
  Administered 2023-03-12 – 2023-03-17 (×10): 40 mg via ORAL
  Filled 2023-03-12 (×11): qty 1

## 2023-03-12 MED ORDER — ACETAMINOPHEN 650 MG RE SUPP: 650.0000 mg | Freq: Four times a day (QID) | RECTAL | Status: DC | PRN

## 2023-03-12 MED ORDER — CLOPIDOGREL BISULFATE 75 MG PO TABS
75.0000 mg | ORAL_TABLET | Freq: Every day | ORAL | Status: DC
  Administered 2023-03-12 – 2023-03-17 (×6): 75 mg via ORAL
  Filled 2023-03-12 (×6): qty 1

## 2023-03-12 MED ORDER — HEPARIN SODIUM (PORCINE) 5000 UNIT/ML IJ SOLN
5000.0000 [IU] | Freq: Three times a day (TID) | INTRAMUSCULAR | Status: DC
  Administered 2023-03-12 – 2023-03-17 (×14): 5000 [IU] via SUBCUTANEOUS
  Filled 2023-03-12 (×14): qty 1

## 2023-03-12 MED ORDER — CHLORHEXIDINE GLUCONATE CLOTH 2 % EX PADS
6.0000 | MEDICATED_PAD | Freq: Every day | CUTANEOUS | Status: DC
  Administered 2023-03-13 – 2023-03-17 (×5): 6 via TOPICAL

## 2023-03-12 MED ORDER — ONDANSETRON HCL 4 MG/2ML IJ SOLN: 4.0000 mg | Freq: Four times a day (QID) | INTRAMUSCULAR | Status: DC | PRN

## 2023-03-12 NOTE — Progress Notes (Signed)
[facility] PROGRESS NOTE  [name] (DOB: [birth year]) ZOX:[id number] PCP: Assunta Found, MD  Brief Narrative: [name] is a 77 y.o. female with a history of Crohn's disease, HTN, CVA, recently treated fungemia who presented to the ED on 03/11/2023 from SNF for concern of poor oral intake and confusion. She had an x-ray done at her facility that showed moderate gaseous distention of the bowel loops compatible with adynamic ileus. Patient has a Foley in place and her last bowel movement was Monday. CT abdomen and pelvis done in the ER showed enterocolitis, resolved SBO, and chronic pancreatitis.   Her midline was discontinued on arrival since it seemed to have been displaced and no longer required IV therapy. Ceftriaxone for UTI started.   Subjective: Pt is very cold, not at all hungry but is willing to try chocolate ensure, Niece at bedside.  Objective: BP 120/61 (BP Location: Left Arm)   Pulse (!) 104   Temp 98.7 F (37.1 C) (Axillary)   Resp 16   Ht 5\' 5"  (1.651 m)   Wt 52.1 kg   SpO2 96%   BMI 19.10 kg/m   Gen: Elderly frail female in no acute distress Pulm: Clear, nonlabored  CV: RRR, no MRG or edema. Rate in 90's on exam. GI: Soft, NT, ND, +BS. +Suprapubic tenderness Neuro: Alert and interactive with speech at her baseline per family at bedside. No new focal deficits. Ext: LE's cool with intact cap refill, no deformities.  Skin: No new rashes, lesions or ulcers on visualized skin   Assessment & Plan: Acute metabolic encephalopathy: Mentation seems to be improving since admission.  - Continue UTI Tx and monitor.     Crohn's disease: - Continue sulfasalazine   History of stroke - Continue plavix (ASA recently discontinued due to concern for GI bleeding)   GERD: - Continue protonix  Hypokalemia:  - Supplement in IVF. Note poor oral intake PTA and suspect ketonemia without acidosis, add dextrose to IVF.    Fungemia: Recently discharged on 02/26/2023 to  complete 2 weeks of micafungin 100mg  IV daily thru midline on 03/08/2023.  - Repeat blood cultures - Needs ophthalmology follow up to r/o endophthalmitis    UTI: UA consistent w/UTI and +suprapubic tenderness. Previous cultures grew Candida glabrata and E. coli that sensitive to ceftriaxone - Continue ceftriaxone pending urine culture this admission.  - Pharmacy consult regarding previous treatment for Candida glabrata   HTN (hypertension) - Continue metoprolol 12.5 mg  History of pSBO: Abd benign currently, resolved obstructive signs radiographically.   Tyrone Nine, MD [facility] [email or web address] 03/12/2023, 9:12 AM

## 2023-03-12 NOTE — Assessment & Plan Note (Signed)
-   UA indicative of UTI - Previous cultures grew Candida glabrata and E. coli that sensitive to ceftriaxone - Continue ceftriaxone - Pharmacy consult regarding previous treatment for Candida glabrata - Continue to monitor

## 2023-03-12 NOTE — Evaluation (Signed)
Physical Therapy Evaluation Patient Details Name: [name] MRN: [id number] DOB: 1946-01-22 Today's Date: 03/12/2023  History of Present Illness  [name] is a 77 y.o. female with medical history significant of Crohn's disease, hypertension, stroke, and more presents the ED with a chief complaint of abdominal pain per chart review.  Upon my exam patient reports she does not know why she is here.  She reports nothing is bothering her right now except for her right arm where her IV is.  Per EMS patient was having abdominal pain since the day prior.  She had an x-ray done at her facility that showed moderate gaseous distention of the bowel loops compatible with adynamic ileus.  Patient has a Foley in place and her last bowel movement was Monday.  CT abdomen and pelvis done in the ER showed enterocolitis, resolved SBO, and chronic pancreatitis.  Patient was given Rocephin, fentanyl, LR 500 bolus in the ED.  No further history could be obtained at this time   Clinical Impression  Patient demonstrates slow labored movement for sitting up at bedside with limited use of LUE due weakness, once seated had frequent leaning to the left, poor left hand grip strength, able to grip RW with right hand, but poor carryover for use due to weakness, inability to flex right knee past 100 degrees and required Max assist stand pivot with left knee blocked to transfer to chair.  Patient tolerated sitting up in chair after therapy with family member present - nursing staff notified.  Patient will benefit from continued skilled physical therapy in hospital and recommended venue below to increase strength, balance, endurance for safe ADLs and gait.           If plan is discharge home, recommend the following: A lot of help with bathing/dressing/bathroom;A lot of help with walking and/or transfers;Help with stairs or ramp for entrance;Assistance with cooking/housework   Can travel by private vehicle   No     Equipment Recommendations None recommended by PT  Recommendations for Other Services       Functional Status Assessment Patient has had a recent decline in their functional status and/or demonstrates limited ability to make significant improvements in function in a reasonable and predictable amount of time     Precautions / Restrictions Precautions Precautions: Fall Precaution Comments: low vision Restrictions Weight Bearing Restrictions: No      Mobility  Bed Mobility Overal bed mobility: Needs Assistance Bed Mobility: Supine to Sit     Supine to sit: Max assist     General bed mobility comments: slow labored movement with leaning to the left    Transfers Overall transfer level: Needs assistance Equipment used: 1 person hand held assist Transfers: Sit to/from Stand, Bed to chair/wheelchair/BSC Sit to Stand: Max assist Stand pivot transfers: Max assist         General transfer comment: poor carryover for using RW due to LUE weakness with poor grip strength    Ambulation/Gait                  Stairs            Wheelchair Mobility     Tilt Bed    Modified Rankin (Stroke Patients Only)       Balance Overall balance assessment: Needs assistance Sitting-balance support: Feet supported, Bilateral upper extremity supported Sitting balance-Leahy Scale: Poor Sitting balance - Comments: seated at EOB with leaning to the left Postural control: Left lateral lean Standing balance support:  During functional activity, No upper extremity supported Standing balance-Leahy Scale: Poor Standing balance comment: hand held assist                             Pertinent Vitals/Pain Pain Assessment Pain Assessment: Faces Faces Pain Scale: Hurts even more Pain Location: right knee when flexing Pain Descriptors / Indicators: Grimacing, Guarding, Moaning, Sore, Sharp Pain Intervention(s): Limited activity within patient's tolerance, Monitored  during session, Repositioned    Home Living Family/patient expects to be discharged to:: Private residence Living Arrangements: Other relatives Available Help at Discharge: Available 24 hours/day;Family Type of Home: House Home Access: Stairs to enter Entrance Stairs-Rails: Right Entrance Stairs-Number of Steps: 3   Home Layout: One level Home Equipment: Agricultural consultant (2 wheels);Shower seat - built in;Cane - single point;Wheelchair - manual      Prior Function Prior Level of Function : Needs assist       Physical Assist : Mobility (physical);ADLs (physical) Mobility (physical): Bed mobility;Transfers;Gait;Stairs   Mobility Comments: two weeks ago, documented pt is a household ambulator. Pt now states she is mostly bed-level, does get to w/c with assist from staff ADLs Comments: Assist for bathing, lower body dressing, and IADL's.     Extremity/Trunk Assessment   Upper Extremity Assessment Upper Extremity Assessment: Generalized weakness;LUE deficits/detail LUE Deficits / Details: grossly -3/5 (unable to lift against gravity, poor hand grip strength) LUE Sensation: decreased light touch;decreased proprioception LUE Coordination: decreased fine motor;decreased gross motor    Lower Extremity Assessment Lower Extremity Assessment: Generalized weakness;LLE deficits/detail;RLE deficits/detail RLE Deficits / Details: grossly -3/5 (unable to flex knee past 100 degrees due to pain) RLE: Unable to fully assess due to pain RLE Sensation: WNL RLE Coordination: WNL LLE Deficits / Details: grossly -3/5 LLE Sensation: decreased light touch LLE Coordination: decreased fine motor;decreased gross motor    Cervical / Trunk Assessment Cervical / Trunk Assessment: Kyphotic  Communication   Communication Communication: No apparent difficulties Cueing Techniques: Verbal cues;Tactile cues  Cognition Arousal: Alert Behavior During Therapy: Anxious, Agitated Overall Cognitive Status:  History of cognitive impairments - at baseline                                 General Comments: Patient apprehensive to participate, requires repeated verbal/tactile cueing        General Comments      Exercises     Assessment/Plan    PT Assessment Patient needs continued PT services  PT Problem List Decreased strength;Decreased activity tolerance;Decreased balance;Decreased mobility;Decreased coordination;Pain;Decreased cognition;Decreased knowledge of use of DME       PT Treatment Interventions DME instruction;Gait training;Functional mobility training;Therapeutic activities;Therapeutic exercise;Balance training;Patient/family education    PT Goals (Current goals can be found in the Care Plan section)  Acute Rehab PT Goals Patient Stated Goal: return home PT Goal Formulation: With patient/family Time For Goal Achievement: 03/26/23 Potential to Achieve Goals: Fair    Frequency Min 3X/week     Co-evaluation               AM-PAC PT "6 Clicks" Mobility  Outcome Measure Help needed turning from your back to your side while in a flat bed without using bedrails?: A Lot Help needed moving from lying on your back to sitting on the side of a flat bed without using bedrails?: A Lot Help needed moving to and from a bed to a chair (including  a wheelchair)?: A Lot Help needed standing up from a chair using your arms (e.g., wheelchair or bedside chair)?: A Lot Help needed to walk in hospital room?: Total Help needed climbing 3-5 steps with a railing? : Total 6 Click Score: 10    End of Session   Activity Tolerance: Patient tolerated treatment well;Patient limited by fatigue;Treatment limited secondary to agitation Patient left: in chair;with call bell/phone within reach;with family/visitor present;with chair alarm set Nurse Communication: Mobility status PT Visit Diagnosis: Unsteadiness on feet (R26.81);Other abnormalities of gait and mobility (R26.89);Muscle  weakness (generalized) (M62.81)    Time: 0981-1914 PT Time Calculation (min) (ACUTE ONLY): 24 min   Charges:   PT Evaluation $PT Eval Moderate Complexity: 1 Mod PT Treatments $Therapeutic Activity: 23-37 mins PT General Charges $$ ACUTE PT VISIT: 1 Visit         12:18 PM, 03/12/23 Ocie Bob, MPT Physical Therapist with [facility] [phone] office [phone] mobile phone

## 2023-03-12 NOTE — Plan of Care (Signed)
  Problem: Acute Rehab PT Goals(only PT should resolve) Goal: Pt Will Go Supine/Side To Sit Outcome: Progressing Flowsheets (Taken 03/12/2023 1219) Pt will go Supine/Side to Sit: with moderate assist Goal: Patient Will Perform Sitting Balance Outcome: Progressing Flowsheets (Taken 03/12/2023 1219) Patient will perform sitting balance:  with contact guard assist  with minimal assist Goal: Patient Will Transfer Sit To/From Stand Outcome: Progressing Flowsheets (Taken 03/12/2023 1219) Patient will transfer sit to/from stand: with moderate assist Goal: Pt Will Transfer Bed To Chair/Chair To Bed Outcome: Progressing Flowsheets (Taken 03/12/2023 1219) Pt will Transfer Bed to Chair/Chair to Bed: with mod assist Goal: Pt Will Ambulate Outcome: Progressing Flowsheets (Taken 03/12/2023 1219) Pt will Ambulate:  10 feet  with moderate assist  with rolling walker Note: Quad cane   12:20 PM, 03/12/23 Ocie Bob, MPT Physical Therapist with [facility] [phone] office [phone] mobile phone

## 2023-03-12 NOTE — Progress Notes (Signed)
Midline dressing was soiled when patient came to the floor and the current nurse and I went to change the dressing. Upon pulling the soiled dressing back we noticed that the antimicrobial disk was placed on one part of the arm but the insertion site was inches away. I was not able to see this on admission due to gauze being used in the dressing at the facility she came from. The midline was pulled. MD made aware.

## 2023-03-12 NOTE — Assessment & Plan Note (Signed)
-   Continue metoprolol 12.5 mg

## 2023-03-12 NOTE — Assessment & Plan Note (Signed)
-   Recently discharged on 02/26/2023 on 2 weeks of micafungin - Looks like that finished on 9/22 - Repeat blood cultures - Continue to monitor

## 2023-03-12 NOTE — Assessment & Plan Note (Signed)
Continue Plavix.  ?

## 2023-03-12 NOTE — Assessment & Plan Note (Signed)
-   Unclear baseline but per last discharge summary patient was able to provide her own history - Oriented to self and birthdate, but not to current date or context - Likely related to UTI - Continue treatment for UTI - No acute neurodeficits on exam - Continue to monitor

## 2023-03-12 NOTE — Assessment & Plan Note (Signed)
-   Continue Protonix

## 2023-03-12 NOTE — Plan of Care (Signed)

## 2023-03-12 NOTE — NC FL2 (Signed)
[place] MEDICAID FL2 LEVEL OF CARE FORM     IDENTIFICATION  Patient Name: [name] Birthdate: 1945/10/12 Sex: female Admission Date (Current Location): 03/11/2023  [facility] and [place] Number:  [employer] and Address:  [facility],  [address] [place] [address]      Provider Number: [id number]  Attending Physician Name and Address:  Tyrone Nine, MD  Relative Name and Phone Number:       Current Level of Care: Hospital Recommended Level of Care: Skilled Nursing Facility Prior Approval Number:    Date Approved/Denied:   PASRR Number: [id number]  Discharge Plan: SNF    Current Diagnoses: Patient Active Problem List   Diagnosis Date Noted   GERD (gastroesophageal reflux disease) 03/12/2023   History of stroke 03/12/2023   Acute metabolic encephalopathy 03/11/2023   Fungemia 02/23/2023   Abnormal ECG 02/20/2023   Demand ischemia 02/20/2023   Aortic dissection (HCC) 02/20/2023   Sepsis secondary to UTI (HCC) 02/19/2023   Urinary retention 02/19/2023   Hyponatremia 02/08/2023   AKI (acute kidney injury) (HCC) 02/08/2023   Leukocytosis 02/08/2023   Stroke (cerebrum) (HCC) 02/07/2023   Postoperative anemia due to acute blood loss    Subcapital fracture of hip (HCC) 04/09/2015   Hip fracture (HCC) 04/09/2015   Left displaced femoral neck fracture (HCC) 04/09/2015   Gastric ulceration    Upper GI bleed    Lower urinary tract infectious disease 02/21/2015   Trichomonal infection 02/21/2015   Small bowel obstruction (HCC) 05/14/2013   Ventral hernia 06/03/2011   HTN (hypertension) 06/03/2011   Crohn's disease (HCC) 05/31/2011   Tobacco abuse 05/31/2011   Benign essential HTN 05/31/2011    Orientation RESPIRATION BLADDER Height & Weight     Self  Normal Continent, Indwelling catheter Weight: 114 lb 12.7 oz (52.1 kg) Height:  5\' 5"  (165.1 cm)  BEHAVIORAL SYMPTOMS/MOOD NEUROLOGICAL BOWEL NUTRITION STATUS      Continent Diet  (See D/C summary)  AMBULATORY STATUS COMMUNICATION OF NEEDS Skin   Extensive Assist Verbally Normal, Other (Comment) (Non pressure would vertebral column upper)                       Personal Care Assistance Level of Assistance  Bathing, Feeding, Dressing Bathing Assistance: Maximum assistance Feeding assistance: Limited assistance Dressing Assistance: Maximum assistance     Functional Limitations Info  Sight, Hearing, Speech Sight Info: Impaired Hearing Info: Adequate Speech Info: Adequate    SPECIAL CARE FACTORS FREQUENCY  PT (By licensed PT), OT (By licensed OT)     PT Frequency: 5 times weekly OT Frequency: 5 times weekly            Contractures Contractures Info: Not present    Additional Factors Info  Code Status, Allergies Code Status Info: FULL Allergies Info: Flagyl (metronidazole Hcl),Other- Unknown antibiotic and unknown pain medications           Current Medications (03/12/2023):  This is the current hospital active medication list Current Facility-Administered Medications  Medication Dose Route Frequency Provider Last Rate Last Admin   acetaminophen (TYLENOL) tablet 650 mg  650 mg Oral Q6H PRN Zierle-Ghosh, Asia B, DO       Or   acetaminophen (TYLENOL) suppository 650 mg  650 mg Rectal Q6H PRN Zierle-Ghosh, Asia B, DO       cefTRIAXone (ROCEPHIN) 1 g in sodium chloride 0.9 % 100 mL IVPB  1 g Intravenous Q24H Zierle-Ghosh, Asia B, DO  clopidogrel (PLAVIX) tablet 75 mg  75 mg Oral Daily Zierle-Ghosh, Asia B, DO   75 mg at 03/12/23 0930   dextrose 5 % and 0.45 % NaCl with KCl 40 mEq/L infusion   Intravenous Continuous Tyrone Nine, MD 75 mL/hr at 03/12/23 0929 New Bag at 03/12/23 0929   feeding supplement (ENSURE ENLIVE / ENSURE PLUS) liquid 237 mL  237 mL Oral BID BM Tyrone Nine, MD       heparin injection 5,000 Units  5,000 Units Subcutaneous Q8H Zierle-Ghosh, Asia B, DO   5,000 Units at 03/12/23 0554   metoprolol succinate (TOPROL-XL) 24 hr  tablet 12.5 mg  12.5 mg Oral Daily Zierle-Ghosh, Asia B, DO   12.5 mg at 03/12/23 0930   ondansetron (ZOFRAN) tablet 4 mg  4 mg Oral Q6H PRN Zierle-Ghosh, Asia B, DO       Or   ondansetron (ZOFRAN) injection 4 mg  4 mg Intravenous Q6H PRN Zierle-Ghosh, Asia B, DO       oxyCODONE (Oxy IR/ROXICODONE) immediate release tablet 5 mg  5 mg Oral Q4H PRN Zierle-Ghosh, Asia B, DO   5 mg at 03/12/23 1054   pantoprazole (PROTONIX) EC tablet 40 mg  40 mg Oral BID AC Zierle-Ghosh, Asia B, DO   40 mg at 03/12/23 0930   sodium bicarbonate tablet 650 mg  650 mg Oral BID Zierle-Ghosh, Asia B, DO   650 mg at 03/12/23 0930   sulfaSALAzine (AZULFIDINE) tablet 500 mg  500 mg Oral BID Zierle-Ghosh, Asia B, DO         Discharge Medications: Please see discharge summary for a list of discharge medications.  Relevant Imaging Results:  Relevant Lab Results:   Additional Information SSN: [id number] [place]

## 2023-03-12 NOTE — Assessment & Plan Note (Signed)
Continue sulfasalazine.

## 2023-03-12 NOTE — TOC Initial Note (Signed)
Transition of Care [facility]) - Initial/Assessment Note    Patient Details  Name: [name] MRN: [id number] Date of Birth: 01/23/1946  Transition of [facility] CM/SW Contact:    Villa Herb, LCSWA Phone Number: 03/12/2023, 1:01 PM  Clinical Narrative:                 CSW updated that pt arrived from SNF at [facility]. CSW spoke with facility who states that pt is a short term resident at their facility and will need a new SNF auth prior to return. CSW spoke with pts daughter who states they prefer a new SNF if possible. Would like referral sent to [facility] facilities. CSW to complete and send out referral. Auth to be started. TOC to follow.   Expected Discharge Plan: Skilled Nursing Facility Barriers to Discharge: Continued Medical Work up   Patient Goals and CMS Choice Patient states their goals for this hospitalization and ongoing recovery are:: go to SNF CMS Medicare.gov Compare Post Acute Care list provided to:: Patient Represenative (must comment) Choice offered to / list presented to : Adult Children [place] ownership interest in [facility].provided to:: Adult Children    Expected Discharge Plan and Services In-house Referral: Clinical Social Work Discharge Planning Services: CM Consult   Living arrangements for the past 2 months: Single Family Home                                      Prior Living Arrangements/Services Living arrangements for the past 2 months: Single Family Home Lives with:: Adult Children Patient language and need for interpreter reviewed:: Yes Do you feel safe going back to the place where you live?: Yes      Need for Family Participation in Patient Care: Yes (Comment) Care giver support system in place?: Yes (comment)   Criminal Activity/Legal Involvement Pertinent to Current Situation/Hospitalization: No - Comment as needed  Activities of Daily Living      Permission Sought/Granted                  Emotional Assessment        Orientation: : Oriented to Self Alcohol / Substance Use: Not Applicable Psych Involvement: No (comment)  Admission diagnosis:  Urinary tract infection without hematuria, site unspecified [N39.0] Acute metabolic encephalopathy [G93.41] Patient Active Problem List   Diagnosis Date Noted   GERD (gastroesophageal reflux disease) 03/12/2023   History of stroke 03/12/2023   Acute metabolic encephalopathy 03/11/2023   Fungemia 02/23/2023   Abnormal ECG 02/20/2023   Demand ischemia 02/20/2023   Aortic dissection (HCC) 02/20/2023   Sepsis secondary to UTI (HCC) 02/19/2023   Urinary retention 02/19/2023   Hyponatremia 02/08/2023   AKI (acute kidney injury) (HCC) 02/08/2023   Leukocytosis 02/08/2023   Stroke (cerebrum) (HCC) 02/07/2023   Postoperative anemia due to acute blood loss    Subcapital fracture of hip (HCC) 04/09/2015   Hip fracture (HCC) 04/09/2015   Left displaced femoral neck fracture (HCC) 04/09/2015   Gastric ulceration    Upper GI bleed    Lower urinary tract infectious disease 02/21/2015   Trichomonal infection 02/21/2015   Small bowel obstruction (HCC) 05/14/2013   Ventral hernia 06/03/2011   HTN (hypertension) 06/03/2011   Crohn's disease (HCC) 05/31/2011   Tobacco abuse 05/31/2011   Benign essential HTN 05/31/2011   PCP:  Assunta Found, MD Pharmacy:   [facility] [place],  [place] - [address] [place] [address] Phone: [phone] Fax: [phone]     Social Determinants of Health (SDOH) Social History: SDOH Screenings   Food Insecurity: No Food Insecurity (02/20/2023)  Housing: Low Risk  (02/20/2023)  Transportation Needs: No Transportation Needs (02/20/2023)  Utilities: Not At Risk (02/20/2023)  Tobacco Use: High Risk (03/11/2023)   SDOH Interventions:     Readmission Risk Interventions     No data to display

## 2023-03-13 DIAGNOSIS — I10 Essential (primary) hypertension: Secondary | ICD-10-CM | POA: Diagnosis not present

## 2023-03-13 DIAGNOSIS — G9341 Metabolic encephalopathy: Secondary | ICD-10-CM | POA: Diagnosis not present

## 2023-03-13 DIAGNOSIS — K219 Gastro-esophageal reflux disease without esophagitis: Secondary | ICD-10-CM | POA: Diagnosis not present

## 2023-03-13 DIAGNOSIS — N390 Urinary tract infection, site not specified: Secondary | ICD-10-CM | POA: Diagnosis not present

## 2023-03-13 LAB — BASIC METABOLIC PANEL
Anion gap: 11 (ref 5–15)
BUN: 12 mg/dL (ref 8–23)
CO2: 27 mmol/L (ref 22–32)
Calcium: 8.5 mg/dL — ABNORMAL LOW (ref 8.9–10.3)
Chloride: 93 mmol/L — ABNORMAL LOW (ref 98–111)
Creatinine, Ser: 0.61 mg/dL (ref 0.44–1.00)
GFR, Estimated: >60 mL/min (ref >60)
Glucose, Bld: 123 mg/dL — ABNORMAL HIGH (ref 70–99)
Potassium: 3.2 mmol/L — ABNORMAL LOW (ref 3.5–5.1)
Sodium: 131 mmol/L — ABNORMAL LOW (ref 135–145)

## 2023-03-13 LAB — VITAMIN B12: Vitamin B-12: 169 pg/mL — ABNORMAL LOW (ref 180–914)

## 2023-03-13 LAB — MAGNESIUM: Magnesium: 1.8 mg/dL (ref 1.7–2.4)

## 2023-03-13 MED ORDER — PIPERACILLIN-TAZOBACTAM 3.375 G IVPB
3.3750 g | Freq: Three times a day (TID) | INTRAVENOUS | Status: DC
  Administered 2023-03-13 – 2023-03-14 (×3): 3.375 g via INTRAVENOUS
  Filled 2023-03-13 (×3): qty 50

## 2023-03-13 MED ORDER — POTASSIUM CHLORIDE IN NACL 20-0.9 MEQ/L-% IV SOLN: INTRAVENOUS | Status: DC

## 2023-03-13 MED ORDER — POTASSIUM CHLORIDE CRYS ER 20 MEQ PO TBCR
40.0000 meq | EXTENDED_RELEASE_TABLET | ORAL | Status: AC
  Administered 2023-03-13 (×2): 40 meq via ORAL
  Filled 2023-03-13 (×2): qty 2

## 2023-03-13 NOTE — Consult Note (Addendum)
Pharmacy Antibiotic Note  ASSESSMENT: 77 y.o. female with PMH Crohn's disease, HTN, CVA, recently treated Candida glabrata fungemia (9/12-9/22) and concomitant SBO is presenting with UTI and possible IAI . Patient has chronic Foley but Ucx indicated as being clean catch, with cultures growing Enterococcus faecalis and Klebsiella pneumonia. Patient had been started on ceftriaxone but with new species data, now escalating therapy. Given E. Faecalis, would like to use ampicillin-containing product like Unasyn but CHL antibiogram demonstrates only 82% K. Pneumo isolates as susceptible. CTAP notes resolved SBO but possible enterocolitis. Pharmacy has been consulted to manage Zosyn dosing.  Patient is afebrile and VSS with no leukocytosis and is a poor historian.  Patient measurements: Height: 5\' 5"  (165.1 cm) Weight: 52.1 kg (114 lb 12.7 oz) IBW/kg (Calculated) : 57  Vital signs: Temp: 98.4 F (36.9 C) (09/27 0429) BP: 133/85 (09/27 1005) Pulse Rate: 92 (09/27 1005) Recent Labs  Lab 03/11/23 1913 03/12/23 0507 03/13/23 0508  WBC 10.2 9.3  --   CREATININE 0.76 0.68 0.61   Estimated Creatinine Clearance: 47.7 mL/min (by C-G formula based on SCr of 0.61 mg/dL).  Allergies: Allergies  Allergen Reactions   Flagyl [Metronidazole Hcl] Other (See Comments)    "makes me feel whoozy and weird"   Other     Unknown antibiotic and unknown pain medications     Antimicrobials this admission: Ceftriaxone 9/25 >> 9/27 Zosyn 9/27 >>  Dose adjustments this admission: N/A  Microbiology results: 9/25 BCx: NG2D 9/25 UCx: >100,000 CFU/mL Kleb pneumo, 60,000 CFU/mL E. faecalis  PLAN: Discontinue ceftriaxone and initiate Zosyn 3.375 g IV q8H (extended infusion) Follow up susceptibilities to assess for antibiotic de-escalation opportunities Monitor renal function to assess for any necessary antibiotic dosing changes.   Thank you for allowing pharmacy to be a part of this patient's  care.  Will M. Dareen Piano, PharmD Clinical Pharmacist 03/13/2023 1:40 PM

## 2023-03-13 NOTE — Progress Notes (Signed)
PROGRESS NOTE  [name] UEA:[id number] DOB: 1946-06-08   PCP: Assunta Found, MD  Patient is from: SNF  DOA: 03/11/2023 LOS: 1  Chief complaints Chief Complaint  Patient presents with   Abdominal Pain     Brief Narrative / Interim history: 77 year old F with PMH of chron's disease, chronic Foley, CVA, HTN who was recently treated for fungemia from 9/12-9/22 brought to ED from SNF due to poor p.o. intake, altered mental status and abnormal KUB concerning for adynamic ileus, and admitted for acute metabolic encephalopathy in the setting of urinary tract infection.  CT abdomen and pelvis showed enterocolitis, resolved SBO and chronic pancreatitis.  UA concerning for UTI.  Urine culture obtained.  Started on IV ceftriaxone    Subjective: Seen and examined earlier this morning.  No major events overnight of this morning.  No complaints but not a great historian.  She is sleepy but wakes to voice.  She is oriented to self, her date of birth and "hospital".  Objective: Vitals:   03/12/23 0348 03/12/23 2030 03/13/23 0429 03/13/23 1005  BP: 120/61 (!) 145/81 (!) 157/77 133/85  Pulse: (!) 104 91 87 92  Resp: 16 17 19    Temp: 98.7 F (37.1 C) 98.4 F (36.9 C) 98.4 F (36.9 C)   TempSrc: Axillary     SpO2: 96% 96% 97% 95%  Weight:      Height:        Examination:  GENERAL: No apparent distress.  Nontoxic. HEENT: MMM.  Vision and hearing grossly intact.  NECK: Supple.  No apparent JVD.  RESP:  No IWOB.  Fair aeration bilaterally. CVS:  RRR. Heart sounds normal.  ABD/GI/GU: BS+. Abd soft, NTND.  MSK/EXT:  Moves extremities. No apparent deformity. No edema.  SKIN: no apparent skin lesion or wound NEURO: Sleepy but wakes to voice.  Oriented to self, DOB and "hospital".  Follows some commands.  Significant weakness in LUE and BLE.  Further neuroexam limited by mental status. PSYCH: Calm. Normal affect.   Procedures:  None  Microbiology summarized: Blood cultures NGTD Urine  culture with Klebsiella pneumonia and Enterococcus faecalis  Assessment and plan: Principal Problem:   Acute metabolic encephalopathy Active Problems:   Crohn's disease (HCC)   HTN (hypertension)   Lower urinary tract infectious disease   Fungemia   GERD (gastroesophageal reflux disease)   History of stroke   Acute metabolic encephalopathy due to urinary tract infection.  UA concerning for UTI.  She is sleepy but wakes to voice.  Has left arm and bilateral lower extremity weakness on exam likely from prior CVA. -Treat UTI as below -Reorientation and delirium precaution -Basic encephalopathy labs including TSH, B12, ammonia and RPR in the morning if no improvement   Crohn's disease (HCC) -Continue sulfasalazine   History of stroke: Has LUE and BLE weakness on exam. -Continue Plavix  GERD (gastroesophageal reflux disease) - Continue Protonix   History of fungemia: Completed 2 weeks of micafungin on 9/22.   Lower urinary tract infectious disease: UA positive.  Urine culture with Klebsiella pneumonia Enterococcus faecalis. -Continue IV ceftriaxone -Add IV vancomycin for Enterococcus faecalis -Follow culture sensitivity   Essential hypertension: Normotensive -Continue metoprolol 12.5 mg  Physical deconditioning -PT/OT  Hyponatremia: Mild -Monitor  Hypokalemia -Monitor replenish as appropriate   Poor oral intake Body mass index is 19.1 kg/m. -Consult dietitian          DVT prophylaxis:  heparin injection 5,000 Units Start: 03/12/23 0600 SCDs Start: 03/12/23 0206  Code Status:  Full code Family Communication: None at bedside Level of care: Telemetry Status is: Inpatient Remains inpatient appropriate because: Acute metabolic encephalopathy and UTI   Final disposition: SNF Consultants:  None  55 minutes with more than 50% spent in reviewing records, counseling patient/family and coordinating care.   Sch Meds:  Scheduled Meds:  Chlorhexidine Gluconate  Cloth  6 each Topical Daily   clopidogrel  75 mg Oral Daily   feeding supplement  237 mL Oral BID BM   heparin  5,000 Units Subcutaneous Q8H   metoprolol succinate  12.5 mg Oral Daily   pantoprazole  40 mg Oral BID AC   potassium chloride  40 mEq Oral Q4H   sodium bicarbonate  650 mg Oral BID   sulfaSALAzine  500 mg Oral BID   Continuous Infusions:  0.9 % NaCl with KCl 20 mEq / L 75 mL/hr at 03/13/23 1236   cefTRIAXone (ROCEPHIN)  IV Stopped (03/12/23 2230)   PRN Meds:.acetaminophen **OR** acetaminophen, ondansetron **OR** ondansetron (ZOFRAN) IV, oxyCODONE  Antimicrobials: Anti-infectives (From admission, onward)    Start     Dose/Rate Route Frequency Ordered Stop   03/12/23 2200  cefTRIAXone (ROCEPHIN) 1 g in sodium chloride 0.9 % 100 mL IVPB        1 g 200 mL/hr over 30 Minutes Intravenous Every 24 hours 03/12/23 0205     03/12/23 0700  micafungin (MYCAMINE) 100 mg in sodium chloride 0.9 % 100 mL IVPB  Status:  Discontinued        100 mg 105 mL/hr over 1 Hours Intravenous Every 24 hours 03/12/23 0534 03/12/23 0534   03/11/23 2045  cefTRIAXone (ROCEPHIN) 1 g in sodium chloride 0.9 % 100 mL IVPB        1 g 200 mL/hr over 30 Minutes Intravenous  Once 03/11/23 2031 03/11/23 2221        I have personally reviewed the following labs and images: CBC: Recent Labs  Lab 03/11/23 1913 03/12/23 0507  WBC 10.2 9.3  NEUTROABS 7.8* 6.7  HGB 14.2 11.7*  HCT 42.3 36.2  MCV 93.8 96.0  PLT 289 237   BMP &GFR Recent Labs  Lab 03/11/23 1913 03/12/23 0507 03/13/23 0508  NA 134* 133* 131*  K 3.7 3.4* 3.2*  CL 93* 95* 93*  CO2 23 25 27   GLUCOSE 96 90 123*  BUN 15 15 12   CREATININE 0.76 0.68 0.61  CALCIUM 9.4 8.8* 8.5*  MG  --  1.8 1.8   Estimated Creatinine Clearance: 47.7 mL/min (by C-G formula based on SCr of 0.61 mg/dL). Liver & Pancreas: Recent Labs  Lab 03/11/23 1913 03/12/23 0507  AST 41 32  ALT 19 17  ALKPHOS 68 57  BILITOT 1.7* 1.0  PROT 6.5 5.7*  ALBUMIN  3.5 3.0*   Recent Labs  Lab 03/11/23 1913  LIPASE 38   No results for input(s): "AMMONIA" in the last 168 hours. Diabetic: No results for input(s): "HGBA1C" in the last 72 hours. No results for input(s): "GLUCAP" in the last 168 hours. Cardiac Enzymes: No results for input(s): "CKTOTAL", "CKMB", "CKMBINDEX", "TROPONINI" in the last 168 hours. No results for input(s): "PROBNP" in the last 8760 hours. Coagulation Profile: No results for input(s): "INR", "PROTIME" in the last 168 hours. Thyroid Function Tests: No results for input(s): "TSH", "T4TOTAL", "FREET4", "T3FREE", "THYROIDAB" in the last 72 hours. Lipid Profile: No results for input(s): "CHOL", "HDL", "LDLCALC", "TRIG", "CHOLHDL", "LDLDIRECT" in the last 72 hours. Anemia Panel: No results for input(s): "VITAMINB12", "FOLATE", "FERRITIN", "  TIBC", "IRON", "RETICCTPCT" in the last 72 hours. Urine analysis:    Component Value Date/Time   COLORURINE AMBER (A) 03/11/2023 1931   APPEARANCEUR CLOUDY (A) 03/11/2023 1931   LABSPEC 1.023 03/11/2023 1931   PHURINE 5.0 03/11/2023 1931   GLUCOSEU NEGATIVE 03/11/2023 1931   HGBUR SMALL (A) 03/11/2023 1931   BILIRUBINUR NEGATIVE 03/11/2023 1931   KETONESUR 20 (A) 03/11/2023 1931   PROTEINUR 100 (A) 03/11/2023 1931   UROBILINOGEN 0.2 04/09/2015 0330   NITRITE POSITIVE (A) 03/11/2023 1931   LEUKOCYTESUR MODERATE (A) 03/11/2023 1931   Sepsis Labs: Invalid input(s): "PROCALCITONIN", "LACTICIDVEN"  Microbiology: Recent Results (from the past 240 hour(s))  Urine Culture     Status: Abnormal (Preliminary result)   Collection Time: 03/11/23  7:31 PM   Specimen: Urine, Clean Catch  Result Value Ref Range Status   Specimen Description   Final    URINE, CLEAN CATCH Performed at [facility], [address] [place], [place] [address]    Special Requests   Final    NONE Performed at [facility], [address] [place], [place] [address]    Culture (A)  Final    >=100,000 COLONIES/mL  KLEBSIELLA PNEUMONIAE 60,000 COLONIES/mL ENTEROCOCCUS FAECALIS SUSCEPTIBILITIES TO FOLLOW Performed at [facility], [address] [place], [place] [address]    Report Status PENDING  Incomplete  Blood culture (routine x 2)     Status: None (Preliminary result)   Collection Time: 03/11/23 11:24 PM   Specimen: BLOOD RIGHT HAND  Result Value Ref Range Status   Specimen Description BLOOD RIGHT HAND  Final   Special Requests   Final    BOTTLES DRAWN AEROBIC AND ANAEROBIC Blood Culture adequate volume   Culture   Final    NO GROWTH 2 DAYS Performed at [facility] [address] [place], [place] [address]    Report Status PENDING  Incomplete  Blood culture (routine x 2)     Status: None (Preliminary result)   Collection Time: 03/11/23 11:24 PM   Specimen: BLOOD  Result Value Ref Range Status   Specimen Description BLOOD PICC LINE  Final   Special Requests   Final    BOTTLES DRAWN AEROBIC AND ANAEROBIC Blood Culture results may not be optimal due to an excessive volume of blood received in culture bottles   Culture   Final    NO GROWTH 2 DAYS Performed at [facility], [address], [place], [place] [address]    Report Status PENDING  Incomplete    Radiology Studies: No results found.    Sumaya Riedesel T. Isidro Monks [facility]  If 7PM-7AM, please contact night-coverage [email or web address] 03/13/2023, 1:02 PM

## 2023-03-13 NOTE — Plan of Care (Signed)
  Problem: Education: Goal: Knowledge of General Education information will improve Description Including pain rating scale, medication(s)/side effects and non-pharmacologic comfort measures Outcome: Progressing   Problem: Clinical Measurements: Goal: Ability to maintain clinical measurements within normal limits will improve Outcome: Progressing Goal: Will remain free from infection Outcome: Progressing Goal: Diagnostic test results will improve Outcome: Progressing Goal: Respiratory complications will improve Outcome: Progressing Goal: Cardiovascular complication will be avoided Outcome: Progressing   Problem: Elimination: Goal: Will not experience complications related to bowel motility Outcome: Progressing Goal: Will not experience complications related to urinary retention Outcome: Progressing   Problem: Pain Managment: Goal: General experience of comfort will improve Outcome: Progressing   Problem: Safety: Goal: Ability to remain free from injury will improve Outcome: Progressing   Problem: Skin Integrity: Goal: Risk for impaired skin integrity will decrease Outcome: Progressing   

## 2023-03-13 NOTE — TOC Progression Note (Signed)
Transition of Care [facility]) - Progression Note    Patient Details  Name: [name] MRN: [id number] Date of Birth: 1945/12/24  Transition of Care [facility]) CM/SW Contact  Villa Herb, [place] Phone Number: 03/13/2023, 3:14 PM  Clinical Narrative:    CSW confirmed that pts insurance [employer] is still pending at this time. TOC continues to follow for updates on auth. TOC to follow.   Expected Discharge Plan: Skilled Nursing Facility Barriers to Discharge: Continued Medical Work up  Expected Discharge Plan and Services In-house Referral: Clinical Social Work Discharge Planning Services: CM Consult   Living arrangements for the past 2 months: Single Family Home                                       Social Determinants of Health (SDOH) Interventions SDOH Screenings   Food Insecurity: No Food Insecurity (02/20/2023)  Housing: Low Risk  (02/20/2023)  Transportation Needs: No Transportation Needs (02/20/2023)  Utilities: Not At Risk (02/20/2023)  Tobacco Use: High Risk (03/11/2023)    Readmission Risk Interventions     No data to display

## 2023-03-14 DIAGNOSIS — N390 Urinary tract infection, site not specified: Secondary | ICD-10-CM | POA: Diagnosis not present

## 2023-03-14 DIAGNOSIS — G9341 Metabolic encephalopathy: Secondary | ICD-10-CM | POA: Diagnosis not present

## 2023-03-14 DIAGNOSIS — K219 Gastro-esophageal reflux disease without esophagitis: Secondary | ICD-10-CM | POA: Diagnosis not present

## 2023-03-14 DIAGNOSIS — I10 Essential (primary) hypertension: Secondary | ICD-10-CM | POA: Diagnosis not present

## 2023-03-14 LAB — MAGNESIUM: Magnesium: 1.9 mg/dL (ref 1.7–2.4)

## 2023-03-14 LAB — URINE CULTURE: Culture: >=100000 — AB

## 2023-03-14 LAB — RENAL FUNCTION PANEL
Albumin: 3 g/dL — ABNORMAL LOW (ref 3.5–5.0)
Anion gap: 11 (ref 5–15)
BUN: 11 mg/dL (ref 8–23)
CO2: 18 mmol/L — ABNORMAL LOW (ref 22–32)
Calcium: 8.7 mg/dL — ABNORMAL LOW (ref 8.9–10.3)
Chloride: 102 mmol/L (ref 98–111)
Creatinine, Ser: 0.63 mg/dL (ref 0.44–1.00)
GFR, Estimated: >60 mL/min (ref >60)
Glucose, Bld: 94 mg/dL (ref 70–99)
Phosphorus: 2 mg/dL — ABNORMAL LOW (ref 2.5–4.6)
Potassium: 5 mmol/L (ref 3.5–5.1)
Sodium: 131 mmol/L — ABNORMAL LOW (ref 135–145)

## 2023-03-14 LAB — CBC
HCT: 34.6 % — ABNORMAL LOW (ref 36.0–46.0)
Hemoglobin: 11.6 g/dL — ABNORMAL LOW (ref 12.0–15.0)
MCH: 32 pg (ref 26.0–34.0)
MCHC: 33.5 g/dL (ref 30.0–36.0)
MCV: 95.6 fL (ref 80.0–100.0)
Platelets: 264 10*3/uL (ref 150–400)
RBC: 3.62 MIL/uL — ABNORMAL LOW (ref 3.87–5.11)
RDW: 16.3 % — ABNORMAL HIGH (ref 11.5–15.5)
WBC: 7.9 10*3/uL (ref 4.0–10.5)
nRBC: 0 % (ref 0.0–0.2)

## 2023-03-14 LAB — TSH: TSH: 8.81 u[IU]/mL — ABNORMAL HIGH (ref 0.350–4.500)

## 2023-03-14 LAB — AMMONIA: Ammonia: 21 umol/L (ref 9–35)

## 2023-03-14 MED ORDER — VITAMIN B-12 1000 MCG PO TABS
1000.0000 ug | ORAL_TABLET | Freq: Every day | ORAL | Status: DC
  Administered 2023-03-16 – 2023-03-17 (×2): 1000 ug via ORAL
  Filled 2023-03-14 (×2): qty 1

## 2023-03-14 MED ORDER — CYANOCOBALAMIN 1000 MCG/ML IJ SOLN
1000.0000 ug | Freq: Every day | INTRAMUSCULAR | Status: AC
  Administered 2023-03-14 – 2023-03-15 (×2): 1000 ug via INTRAMUSCULAR
  Filled 2023-03-14 (×2): qty 1

## 2023-03-14 MED ORDER — SODIUM PHOSPHATES 45 MMOLE/15ML IV SOLN
15.0000 mmol | Freq: Once | INTRAVENOUS | Status: AC
  Administered 2023-03-14: 15 mmol via INTRAVENOUS
  Filled 2023-03-14: qty 5

## 2023-03-14 MED ORDER — SODIUM CHLORIDE 0.9 % IV SOLN: INTRAVENOUS | Status: DC

## 2023-03-14 MED ORDER — NITROFURANTOIN MONOHYD MACRO 100 MG PO CAPS
100.0000 mg | ORAL_CAPSULE | Freq: Two times a day (BID) | ORAL | Status: DC
  Administered 2023-03-14 – 2023-03-17 (×7): 100 mg via ORAL
  Filled 2023-03-14 (×7): qty 1

## 2023-03-14 MED ORDER — ASPIRIN 81 MG PO TBEC
81.0000 mg | DELAYED_RELEASE_TABLET | Freq: Every day | ORAL | Status: DC
  Administered 2023-03-14 – 2023-03-17 (×4): 81 mg via ORAL
  Filled 2023-03-14 (×4): qty 1

## 2023-03-14 NOTE — Progress Notes (Signed)
Per night shift report, patient with swelling to left hand/ext. Patient has quite a bit of bruising to left hand. Elevated on pillow. Patient repositioned with pillows.

## 2023-03-14 NOTE — Plan of Care (Signed)
  Problem: Clinical Measurements: Goal: Diagnostic test results will improve Outcome: Progressing   

## 2023-03-14 NOTE — Progress Notes (Signed)
PROGRESS NOTE  [name] [id number] DOB: 1945/10/30   PCP: Assunta Found, MD  Patient is from: SNF  DOA: 03/11/2023 LOS: 2  Chief complaints Chief Complaint  Patient presents with   Abdominal Pain     Brief Narrative / Interim history: 77 year old F with PMH of chron's disease, chronic Foley, CVA with left hemiparesis in the setting of severe right ICA stenosis in 01/2023, HTN who was recently treated for fungemia from 9/12-9/22 brought to ED from SNF due to poor p.o. intake, altered mental status and abnormal KUB concerning for adynamic ileus, and admitted for acute metabolic encephalopathy in the setting of urinary tract infection.  CT abdomen and pelvis showed enterocolitis, resolved SBO and chronic pancreatitis.  UA concerning for UTI.  Urine culture obtained.  Started on IV ceftriaxone.  Patient's urine culture grew Klebsiella pneumonia and Enterococcus faecalis.  Antibiotic escalated to IV Zosyn on 8/27 and then de-escalated to p.o. Macrobid based on culture sensitivity.  Encephalopathy improved.    Subjective: Seen and examined earlier this morning.  No major events overnight of this morning. She is awake and alert.  She is oriented to self, person and place but not time.  No specific complaints.  Patient's sister at bedside  Objective: Vitals:   03/13/23 1948 03/14/23 0453 03/14/23 1013 03/14/23 1424  BP: 127/79 137/81 120/79 139/69  Pulse:   (!) 109 89  Resp: 18 16  17   Temp: 98.4 F (36.9 C) 97.8 F (36.6 C)  97.6 F (36.4 C)  TempSrc:  Oral    SpO2: 97% 97%  97%  Weight:      Height:        Examination:  GENERAL: No apparent distress.  Nontoxic. HEENT: MMM.  Vision and hearing grossly intact.  NECK: Supple.  No apparent JVD.  RESP:  No IWOB.  Fair aeration bilaterally. CVS:  RRR. Heart sounds normal.  ABD/GI/GU: BS+. Abd soft, NTND.  MSK/EXT: Left-sided weakness.  No apparent deformity.  Some swelling in left arm. SKIN: no apparent skin lesion or  wound NEURO: Awake and alert.  Oriented to self, person and place but not time.  Follows commands.  She has left-sided weakness from prior stroke.  No facial asymmetry.  Speech is clear. PSYCH: Calm. Normal affect.   Procedures:  None  Microbiology summarized: Blood cultures NGTD Urine culture with Klebsiella pneumonia and Enterococcus faecalis  Assessment and plan: Principal Problem:   Acute metabolic encephalopathy Active Problems:   Crohn's disease (HCC)   HTN (hypertension)   Lower urinary tract infectious disease   Fungemia   GERD (gastroesophageal reflux disease)   History of stroke  Acute metabolic encephalopathy due to urinary tract infection.  UA concerning for UTI.  UA and urine culture suggests UTI.  B12 low at 169.  TSH, ammonia and VBG within normal.  Encephalopathy improved.  She is awake and alert and oriented to self, place and person but not time.  Follows command. -Treat UTI and B12 deficiency as below -Reorientation and delirium precaution -Follow RPR  Lower urinary tract infectious disease: UA positive.  Urine culture with Klebsiella pneumonia Enterococcus faecalis. -IV CTX 9/25>> IV Zosyn 9/27>> p.o. Macrobid 9/28>>   History of right MCA CVA/severe proximal right ICA tendinosis: Hospitalized last month for right MCA CVA with severe, nearly occlusive proximal right ICA stenosis.  At that time, she was discharged on Plavix and aspirin until she follow-up with vascular.  Unsure when aspirin was discontinued. -Continue Plavix -Start low-dose aspirin -Outpatient follow-up  with vascular surgery as previously planned -Avoid hypotension  History of fungemia: Completed 2 weeks of micafungin on 9/22.  GERD: -Continue Protonix   Crohn's disease (HCC) -Continue sulfasalazine   Essential hypertension: Normotensive -Continue metoprolol 12.5 mg  Physical deconditioning -PT/OT  Hyponatremia: Mild and stable -Continue IV  fluid  Hypokalemia/hypophosphatemia -Monitor replenish as appropriate  Vitamin B12 deficiency: B12 169. -Vitamin B12 injection x 2 followed by p.o.  Poor oral intake Body mass index is 19.1 kg/m. -Consulted dietitian -Liberated diet          DVT prophylaxis:  heparin injection 5,000 Units Start: 03/12/23 0600 SCDs Start: 03/12/23 0206  Code Status: Full code Family Communication: None at bedside Level of care: Telemetry Status is: Inpatient Remains inpatient appropriate because: Acute metabolic encephalopathy and UTI   Final disposition: SNF Consultants:  None  55 minutes with more than 50% spent in reviewing records, counseling patient/family and coordinating care.   Sch Meds:  Scheduled Meds:  Chlorhexidine Gluconate Cloth  6 each Topical Daily   clopidogrel  75 mg Oral Daily   cyanocobalamin  1,000 mcg Intramuscular Daily   Followed by   Melene Muller ON 03/16/2023] vitamin B-12  1,000 mcg Oral Daily   feeding supplement  237 mL Oral BID BM   heparin  5,000 Units Subcutaneous Q8H   metoprolol succinate  12.5 mg Oral Daily   nitrofurantoin (macrocrystal-monohydrate)  100 mg Oral Q12H   pantoprazole  40 mg Oral BID AC   sodium bicarbonate  650 mg Oral BID   sulfaSALAzine  500 mg Oral BID   Continuous Infusions:  sodium chloride 75 mL/hr at 03/14/23 0913   sodium phosphate 15 mmol in dextrose 5 % 250 mL infusion 15 mmol (03/14/23 1048)   PRN Meds:.acetaminophen **OR** acetaminophen, ondansetron **OR** ondansetron (ZOFRAN) IV, oxyCODONE  Antimicrobials: Anti-infectives (From admission, onward)    Start     Dose/Rate Route Frequency Ordered Stop   03/14/23 1200  nitrofurantoin (macrocrystal-monohydrate) (MACROBID) capsule 100 mg        100 mg Oral Every 12 hours 03/14/23 1105 03/18/23 0959   03/13/23 1430  piperacillin-tazobactam (ZOSYN) IVPB 3.375 g  Status:  Discontinued        3.375 g 12.5 mL/hr over 240 Minutes Intravenous Every 8 hours 03/13/23 1339  03/14/23 1105   03/12/23 2200  cefTRIAXone (ROCEPHIN) 1 g in sodium chloride 0.9 % 100 mL IVPB  Status:  Discontinued        1 g 200 mL/hr over 30 Minutes Intravenous Every 24 hours 03/12/23 0205 03/13/23 1339   03/12/23 0700  micafungin (MYCAMINE) 100 mg in sodium chloride 0.9 % 100 mL IVPB  Status:  Discontinued        100 mg 105 mL/hr over 1 Hours Intravenous Every 24 hours 03/12/23 0534 03/12/23 0534   03/11/23 2045  cefTRIAXone (ROCEPHIN) 1 g in sodium chloride 0.9 % 100 mL IVPB        1 g 200 mL/hr over 30 Minutes Intravenous  Once 03/11/23 2031 03/11/23 2221        I have personally reviewed the following labs and images: CBC: Recent Labs  Lab 03/11/23 1913 03/12/23 0507 03/14/23 0510  WBC 10.2 9.3 7.9  NEUTROABS 7.8* 6.7  --   HGB 14.2 11.7* 11.6*  HCT 42.3 36.2 34.6*  MCV 93.8 96.0 95.6  PLT 289 237 264   BMP &GFR Recent Labs  Lab 03/11/23 1913 03/12/23 0507 03/13/23 0508 03/14/23 0510  NA 134* 133* 131* 131*  K 3.7 3.4* 3.2* 5.0  CL 93* 95* 93* 102  CO2 23 25 27  18*  GLUCOSE 96 90 123* 94  BUN 15 15 12 11   CREATININE 0.76 0.68 0.61 0.63  CALCIUM 9.4 8.8* 8.5* 8.7*  MG  --  1.8 1.8 1.9  PHOS  --   --   --  2.0*   Estimated Creatinine Clearance: 47.7 mL/min (by C-G formula based on SCr of 0.63 mg/dL). Liver & Pancreas: Recent Labs  Lab 03/11/23 1913 03/12/23 0507 03/14/23 0510  AST 41 32  --   ALT 19 17  --   ALKPHOS 68 57  --   BILITOT 1.7* 1.0  --   PROT 6.5 5.7*  --   ALBUMIN 3.5 3.0* 3.0*   Recent Labs  Lab 03/11/23 1913  LIPASE 38   Recent Labs  Lab 03/14/23 0510  AMMONIA 21   Diabetic: No results for input(s): "HGBA1C" in the last 72 hours. No results for input(s): "GLUCAP" in the last 168 hours. Cardiac Enzymes: No results for input(s): "CKTOTAL", "CKMB", "CKMBINDEX", "TROPONINI" in the last 168 hours. No results for input(s): "PROBNP" in the last 8760 hours. Coagulation Profile: No results for input(s): "INR", "PROTIME" in  the last 168 hours. Thyroid Function Tests: Recent Labs    03/14/23 0510  TSH 8.810*   Lipid Profile: No results for input(s): "CHOL", "HDL", "LDLCALC", "TRIG", "CHOLHDL", "LDLDIRECT" in the last 72 hours. Anemia Panel: Recent Labs    03/13/23 0508  VITAMINB12 169*   Urine analysis:    Component Value Date/Time   COLORURINE AMBER (A) 03/11/2023 1931   APPEARANCEUR CLOUDY (A) 03/11/2023 1931   LABSPEC 1.023 03/11/2023 1931   PHURINE 5.0 03/11/2023 1931   GLUCOSEU NEGATIVE 03/11/2023 1931   HGBUR SMALL (A) 03/11/2023 1931   BILIRUBINUR NEGATIVE 03/11/2023 1931   KETONESUR 20 (A) 03/11/2023 1931   PROTEINUR 100 (A) 03/11/2023 1931   UROBILINOGEN 0.2 04/09/2015 0330   NITRITE POSITIVE (A) 03/11/2023 1931   LEUKOCYTESUR MODERATE (A) 03/11/2023 1931   Sepsis Labs: Invalid input(s): "PROCALCITONIN", "LACTICIDVEN"  Microbiology: Recent Results (from the past 240 hour(s))  Urine Culture     Status: Abnormal   Collection Time: 03/11/23  7:31 PM   Specimen: Urine, Clean Catch  Result Value Ref Range Status   Specimen Description   Final    URINE, CLEAN CATCH Performed at [facility], [address] [place], [place] [address]    Special Requests   Final    NONE Performed at [facility], [address], [place], [place] [address]    Culture (A)  Final    >=100,000 COLONIES/mL KLEBSIELLA PNEUMONIAE 60,000 COLONIES/mL ENTEROCOCCUS FAECALIS Confirmed Extended Spectrum Beta-Lactamase Producer (ESBL).  In bloodstream infections from ESBL organisms, carbapenems are preferred over piperacillin/tazobactam. They are shown to have a lower risk of mortality.    Report Status 03/14/2023 FINAL  Final   Organism ID, Bacteria KLEBSIELLA PNEUMONIAE (A)  Final   Organism ID, Bacteria ENTEROCOCCUS FAECALIS (A)  Final      Susceptibility   Enterococcus faecalis - MIC*    AMPICILLIN <=2 SENSITIVE Sensitive     NITROFURANTOIN <=16 SENSITIVE Sensitive     VANCOMYCIN 1 SENSITIVE Sensitive      * 60,000 COLONIES/mL ENTEROCOCCUS FAECALIS   Klebsiella pneumoniae - MIC*    AMPICILLIN >=32 RESISTANT Resistant     CEFAZOLIN RESISTANT Resistant     CEFEPIME <=0.12 SENSITIVE Sensitive     CEFTRIAXONE 8 RESISTANT Resistant  CIPROFLOXACIN <=0.25 SENSITIVE Sensitive     GENTAMICIN <=1 SENSITIVE Sensitive     IMIPENEM 0.5 SENSITIVE Sensitive     NITROFURANTOIN <=16 SENSITIVE Sensitive     TRIMETH/SULFA >=320 RESISTANT Resistant     AMPICILLIN/SULBACTAM >=32 RESISTANT Resistant     PIP/TAZO 8 SENSITIVE Sensitive     * >=100,000 COLONIES/mL KLEBSIELLA PNEUMONIAE  Blood culture (routine x 2)     Status: None (Preliminary result)   Collection Time: 03/11/23 11:24 PM   Specimen: BLOOD RIGHT HAND  Result Value Ref Range Status   Specimen Description BLOOD RIGHT HAND  Final   Special Requests   Final    BOTTLES DRAWN AEROBIC AND ANAEROBIC Blood Culture adequate volume   Culture   Final    NO GROWTH 3 DAYS Performed at [facility], [address] [place], [place] [address]    Report Status PENDING  Incomplete  Blood culture (routine x 2)     Status: None (Preliminary result)   Collection Time: 03/11/23 11:24 PM   Specimen: BLOOD  Result Value Ref Range Status   Specimen Description BLOOD PICC LINE  Final   Special Requests   Final    BOTTLES DRAWN AEROBIC AND ANAEROBIC Blood Culture results may not be optimal due to an excessive volume of blood received in culture bottles   Culture   Final    NO GROWTH 3 DAYS Performed at [facility], [address] [place], [place] [address]    Report Status PENDING  Incomplete    Radiology Studies: No results found.    Amariona Rathje T. Gracen Ringwald [facility]  If 7PM-7AM, please contact night-coverage [email or web address] 03/14/2023, 3:44 PM

## 2023-03-15 ENCOUNTER — Inpatient Hospital Stay (HOSPITAL_COMMUNITY): Payer: Medicare Other

## 2023-03-15 DIAGNOSIS — I10 Essential (primary) hypertension: Secondary | ICD-10-CM | POA: Diagnosis not present

## 2023-03-15 DIAGNOSIS — K219 Gastro-esophageal reflux disease without esophagitis: Secondary | ICD-10-CM | POA: Diagnosis not present

## 2023-03-15 DIAGNOSIS — N390 Urinary tract infection, site not specified: Secondary | ICD-10-CM | POA: Diagnosis not present

## 2023-03-15 DIAGNOSIS — G9341 Metabolic encephalopathy: Secondary | ICD-10-CM | POA: Diagnosis not present

## 2023-03-15 LAB — CBC
HCT: 33.4 % — ABNORMAL LOW (ref 36.0–46.0)
Hemoglobin: 10.3 g/dL — ABNORMAL LOW (ref 12.0–15.0)
MCH: 30.6 pg (ref 26.0–34.0)
MCHC: 30.8 g/dL (ref 30.0–36.0)
MCV: 99.1 fL (ref 80.0–100.0)
Platelets: 211 10*3/uL (ref 150–400)
RBC: 3.37 MIL/uL — ABNORMAL LOW (ref 3.87–5.11)
RDW: 16.5 % — ABNORMAL HIGH (ref 11.5–15.5)
WBC: 5.8 10*3/uL (ref 4.0–10.5)
nRBC: 0 % (ref 0.0–0.2)

## 2023-03-15 LAB — RENAL FUNCTION PANEL
Albumin: 2.9 g/dL — ABNORMAL LOW (ref 3.5–5.0)
Anion gap: 10 (ref 5–15)
BUN: 11 mg/dL (ref 8–23)
CO2: 22 mmol/L (ref 22–32)
Calcium: 8.6 mg/dL — ABNORMAL LOW (ref 8.9–10.3)
Chloride: 103 mmol/L (ref 98–111)
Creatinine, Ser: 0.72 mg/dL (ref 0.44–1.00)
GFR, Estimated: >60 mL/min (ref >60)
Glucose, Bld: 99 mg/dL (ref 70–99)
Phosphorus: 3.7 mg/dL (ref 2.5–4.6)
Potassium: 4 mmol/L (ref 3.5–5.1)
Sodium: 135 mmol/L (ref 135–145)

## 2023-03-15 LAB — T4, FREE: Free T4: 0.99 ng/dL (ref 0.61–1.12)

## 2023-03-15 LAB — RPR: RPR Ser Ql: NONREACTIVE

## 2023-03-15 LAB — MAGNESIUM: Magnesium: 1.7 mg/dL (ref 1.7–2.4)

## 2023-03-15 MED ORDER — ORAL CARE MOUTH RINSE
15.0000 mL | OROMUCOSAL | Status: DC
  Administered 2023-03-15 – 2023-03-17 (×9): 15 mL via OROMUCOSAL

## 2023-03-15 MED ORDER — ORAL CARE MOUTH RINSE: 15.0000 mL | OROMUCOSAL | Status: DC | PRN

## 2023-03-15 NOTE — Progress Notes (Signed)
Pt has swelling to left wrist with bruising, c/o pain when moving left arm. MD made aware via secure chat, previous swelling documented to left arm.

## 2023-03-15 NOTE — Plan of Care (Signed)
progressing 

## 2023-03-15 NOTE — Progress Notes (Signed)
PROGRESS NOTE  [name] QMV:[id number] DOB: 1945-10-17   PCP: Assunta Found, MD  Patient is from: SNF  DOA: 03/11/2023 LOS: 3  Chief complaints Chief Complaint  Patient presents with   Abdominal Pain     Brief Narrative / Interim history: 77 year old F with PMH of chron's disease, chronic Foley, CVA with left hemiparesis in the setting of severe right ICA stenosis in 01/2023, HTN who was recently treated for fungemia from 9/12-9/22 brought to ED from SNF due to poor p.o. intake, altered mental status and abnormal KUB concerning for adynamic ileus, and admitted for acute metabolic encephalopathy in the setting of urinary tract infection.  CT abdomen and pelvis showed enterocolitis, resolved SBO and chronic pancreatitis.  UA concerning for UTI.  Urine culture obtained.   It seems like urine was obtained from old Foley. Started on IV ceftriaxone.  Patient's urine culture grew Klebsiella pneumonia and Enterococcus faecalis. Antibiotic escalated to IV Zosyn on 8/27 and then de-escalated to p.o. Macrobid based on culture sensitivity.  Encephalopathy improved.  Likely back to baseline.  Can return to SNF after insurance authorization.   Subjective: Seen and examined earlier this morning.  No major events overnight of this morning.  She is awake.  Oriented to self, person and place.  Likes to have help for breakfast  Objective: Vitals:   03/14/23 1013 03/14/23 1424 03/14/23 2101 03/15/23 0625  BP: 120/79 139/69 (!) 140/75 (!) 148/88  Pulse: (!) 109 89 94 80  Resp:  17 16 18   Temp:  97.6 F (36.4 C) 98.1 F (36.7 C) 98 F (36.7 C)  TempSrc:   Oral Oral  SpO2:  97% 98% 95%  Weight:      Height:        Examination:  GENERAL: No apparent distress.  Nontoxic. HEENT: MMM.  Vision and hearing grossly intact.  NECK: Supple.  No apparent JVD.  RESP:  No IWOB.  Fair aeration bilaterally. CVS:  RRR. Heart sounds normal.  ABD/GI/GU: BS+. Abd soft, NTND.  Foley catheter in  place. MSK/EXT: Left-sided weakness.  No apparent deformity.  Some swelling in left arm. SKIN: no apparent skin lesion or wound NEURO: Awake and alert.  Oriented to self, person and place but not time.  Follows commands.  She has left-sided weakness from prior stroke.  No facial asymmetry.  Speech is clear. PSYCH: Calm. Normal affect.   Procedures:  None  Microbiology summarized: Blood cultures NGTD Urine culture with Klebsiella pneumonia and Enterococcus faecalis  Assessment and plan: Principal Problem:   Acute metabolic encephalopathy Active Problems:   Crohn's disease (HCC)   HTN (hypertension)   Lower urinary tract infectious disease   Fungemia   GERD (gastroesophageal reflux disease)   History of stroke  Acute metabolic encephalopathy due to urinary tract infection.  UA concerning for UTI.  UA and urine culture suggests UTI.  B12 low at 169.  TSH slightly elevated but free T4 normal.  RPR, ammonia and VBG within normal.  Encephalopathy improved.  She is awake and alert and oriented to self, place and person but not time.  Follows command.  Likely at baseline. -Treat UTI and B12 deficiency as below -Reorientation and delirium precaution -Discontinue IV fluid  Lower urinary tract infectious disease: UA positive.  Urine culture with Klebsiella pneumonia Enterococcus faecalis. -IV CTX 9/25>> IV Zosyn 9/27>> p.o. Macrobid 9/28>> -Change Foley catheter   History of right MCA CVA/severe proximal right ICA tendinosis: Hospitalized last month for right MCA CVA with severe,  nearly occlusive proximal right ICA stenosis.  At that time, she was discharged on Plavix and aspirin until she follow-up with vascular.  Unsure when aspirin was discontinued. -Continue Plavix -Started low-dose aspirin on 9/28 -Outpatient follow-up with vascular surgery as previously planned  History of fungemia: Completed 2 weeks of micafungin on 9/22.  Crohn's disease (HCC) -Continue sulfasalazine    Essential hypertension: BP slightly elevated. -Continue metoprolol 12.5 mg -Discontinue IV fluid  Physical deconditioning -PT/OT recommended return to SNF  Hyponatremia: Resolved. -Discontinue IV fluid  Hypokalemia/hypophosphatemia: Resolved. -Monitor replenish as appropriate  Vitamin B12 deficiency: B12 169. -Vitamin B12 injection x 2 followed by p.o.  GERD: -Continue Protonix  Poor oral intake Body mass index is 19.1 kg/m. Nutrition Problem: Inadequate oral intake Etiology: vomiting, nausea Signs/Symptoms: meal completion < 25% Interventions: Ensure Enlive (each supplement provides 350kcal and 20 grams of protein), Snacks   DVT prophylaxis:  heparin injection 5,000 Units Start: 03/12/23 0600 SCDs Start: 03/12/23 0206  Code Status: Full code Family Communication: None at bedside Level of care: Telemetry Status is: Inpatient Remains inpatient appropriate because: Acute metabolic encephalopathy and UTI   Final disposition: SNF Consultants:  None  35 minutes with more than 50% spent in reviewing records, counseling patient/family and coordinating care.   Sch Meds:  Scheduled Meds:  aspirin EC  81 mg Oral Daily   Chlorhexidine Gluconate Cloth  6 each Topical Daily   clopidogrel  75 mg Oral Daily   [START ON 03/16/2023] vitamin B-12  1,000 mcg Oral Daily   feeding supplement  237 mL Oral BID BM   heparin  5,000 Units Subcutaneous Q8H   metoprolol succinate  12.5 mg Oral Daily   nitrofurantoin (macrocrystal-monohydrate)  100 mg Oral Q12H   mouth rinse  15 mL Mouth Rinse 4 times per day   pantoprazole  40 mg Oral BID AC   sodium bicarbonate  650 mg Oral BID   sulfaSALAzine  500 mg Oral BID   Continuous Infusions:   PRN Meds:.acetaminophen **OR** acetaminophen, ondansetron **OR** ondansetron (ZOFRAN) IV, mouth rinse, oxyCODONE  Antimicrobials: Anti-infectives (From admission, onward)    Start     Dose/Rate Route Frequency Ordered Stop   03/14/23 1200   nitrofurantoin (macrocrystal-monohydrate) (MACROBID) capsule 100 mg        100 mg Oral Every 12 hours 03/14/23 1105 03/18/23 0959   03/13/23 1430  piperacillin-tazobactam (ZOSYN) IVPB 3.375 g  Status:  Discontinued        3.375 g 12.5 mL/hr over 240 Minutes Intravenous Every 8 hours 03/13/23 1339 03/14/23 1105   03/12/23 2200  cefTRIAXone (ROCEPHIN) 1 g in sodium chloride 0.9 % 100 mL IVPB  Status:  Discontinued        1 g 200 mL/hr over 30 Minutes Intravenous Every 24 hours 03/12/23 0205 03/13/23 1339   03/12/23 0700  micafungin (MYCAMINE) 100 mg in sodium chloride 0.9 % 100 mL IVPB  Status:  Discontinued        100 mg 105 mL/hr over 1 Hours Intravenous Every 24 hours 03/12/23 0534 03/12/23 0534   03/11/23 2045  cefTRIAXone (ROCEPHIN) 1 g in sodium chloride 0.9 % 100 mL IVPB        1 g 200 mL/hr over 30 Minutes Intravenous  Once 03/11/23 2031 03/11/23 2221        I have personally reviewed the following labs and images: CBC: Recent Labs  Lab 03/11/23 1913 03/12/23 0507 03/14/23 0510 03/15/23 0504  WBC 10.2 9.3 7.9 5.8  NEUTROABS 7.8*  6.7  --   --   HGB 14.2 11.7* 11.6* 10.3*  HCT 42.3 36.2 34.6* 33.4*  MCV 93.8 96.0 95.6 99.1  PLT 289 237 264 211   BMP &GFR Recent Labs  Lab 03/11/23 1913 03/12/23 0507 03/13/23 0508 03/14/23 0510 03/15/23 0504  NA 134* 133* 131* 131* 135  K 3.7 3.4* 3.2* 5.0 4.0  CL 93* 95* 93* 102 103  CO2 23 25 27  18* 22  GLUCOSE 96 90 123* 94 99  BUN 15 15 12 11 11   CREATININE 0.76 0.68 0.61 0.63 0.72  CALCIUM 9.4 8.8* 8.5* 8.7* 8.6*  MG  --  1.8 1.8 1.9 1.7  PHOS  --   --   --  2.0* 3.7   Estimated Creatinine Clearance: 47.7 mL/min (by C-G formula based on SCr of 0.72 mg/dL). Liver & Pancreas: Recent Labs  Lab 03/11/23 1913 03/12/23 0507 03/14/23 0510 03/15/23 0504  AST 41 32  --   --   ALT 19 17  --   --   ALKPHOS 68 57  --   --   BILITOT 1.7* 1.0  --   --   PROT 6.5 5.7*  --   --   ALBUMIN 3.5 3.0* 3.0* 2.9*   Recent Labs   Lab 03/11/23 1913  LIPASE 38   Recent Labs  Lab 03/14/23 0510  AMMONIA 21   Diabetic: No results for input(s): "HGBA1C" in the last 72 hours. No results for input(s): "GLUCAP" in the last 168 hours. Cardiac Enzymes: No results for input(s): "CKTOTAL", "CKMB", "CKMBINDEX", "TROPONINI" in the last 168 hours. No results for input(s): "PROBNP" in the last 8760 hours. Coagulation Profile: No results for input(s): "INR", "PROTIME" in the last 168 hours. Thyroid Function Tests: Recent Labs    03/14/23 0510 03/15/23 0748  TSH 8.810*  --   FREET4  --  0.99   Lipid Profile: No results for input(s): "CHOL", "HDL", "LDLCALC", "TRIG", "CHOLHDL", "LDLDIRECT" in the last 72 hours. Anemia Panel: Recent Labs    03/13/23 0508  VITAMINB12 169*   Urine analysis:    Component Value Date/Time   COLORURINE AMBER (A) 03/11/2023 1931   APPEARANCEUR CLOUDY (A) 03/11/2023 1931   LABSPEC 1.023 03/11/2023 1931   PHURINE 5.0 03/11/2023 1931   GLUCOSEU NEGATIVE 03/11/2023 1931   HGBUR SMALL (A) 03/11/2023 1931   BILIRUBINUR NEGATIVE 03/11/2023 1931   KETONESUR 20 (A) 03/11/2023 1931   PROTEINUR 100 (A) 03/11/2023 1931   UROBILINOGEN 0.2 04/09/2015 0330   NITRITE POSITIVE (A) 03/11/2023 1931   LEUKOCYTESUR MODERATE (A) 03/11/2023 1931   Sepsis Labs: Invalid input(s): "PROCALCITONIN", "LACTICIDVEN"  Microbiology: Recent Results (from the past 240 hour(s))  Urine Culture     Status: Abnormal   Collection Time: 03/11/23  7:31 PM   Specimen: Urine, Clean Catch  Result Value Ref Range Status   Specimen Description   Final    URINE, CLEAN CATCH Performed at [facility], [address] [place], [place] [address]    Special Requests   Final    NONE Performed at [facility], [address] [place], [place] [address]    Culture (A)  Final    >=100,000 COLONIES/mL KLEBSIELLA PNEUMONIAE 60,000 COLONIES/mL ENTEROCOCCUS FAECALIS Confirmed Extended Spectrum Beta-Lactamase Producer (ESBL).   In bloodstream infections from ESBL organisms, carbapenems are preferred over piperacillin/tazobactam. They are shown to have a lower risk of mortality.    Report Status 03/14/2023 FINAL  Final   Organism ID, Bacteria KLEBSIELLA PNEUMONIAE (A)  Final   Organism ID, Bacteria ENTEROCOCCUS FAECALIS (A)  Final      Susceptibility   Enterococcus faecalis - MIC*    AMPICILLIN <=2 SENSITIVE Sensitive     NITROFURANTOIN <=16 SENSITIVE Sensitive     VANCOMYCIN 1 SENSITIVE Sensitive     * 60,000 COLONIES/mL ENTEROCOCCUS FAECALIS   Klebsiella pneumoniae - MIC*    AMPICILLIN >=32 RESISTANT Resistant     CEFAZOLIN RESISTANT Resistant     CEFEPIME <=0.12 SENSITIVE Sensitive     CEFTRIAXONE 8 RESISTANT Resistant     CIPROFLOXACIN <=0.25 SENSITIVE Sensitive     GENTAMICIN <=1 SENSITIVE Sensitive     IMIPENEM 0.5 SENSITIVE Sensitive     NITROFURANTOIN <=16 SENSITIVE Sensitive     TRIMETH/SULFA >=320 RESISTANT Resistant     AMPICILLIN/SULBACTAM >=32 RESISTANT Resistant     PIP/TAZO 8 SENSITIVE Sensitive     * >=100,000 COLONIES/mL KLEBSIELLA PNEUMONIAE  Blood culture (routine x 2)     Status: None (Preliminary result)   Collection Time: 03/11/23 11:24 PM   Specimen: BLOOD RIGHT HAND  Result Value Ref Range Status   Specimen Description BLOOD RIGHT HAND  Final   Special Requests   Final    BOTTLES DRAWN AEROBIC AND ANAEROBIC Blood Culture adequate volume   Culture   Final    NO GROWTH 4 DAYS Performed at [facility], [address] [place], [place] [address]    Report Status PENDING  Incomplete  Blood culture (routine x 2)     Status: None (Preliminary result)   Collection Time: 03/11/23 11:24 PM   Specimen: BLOOD  Result Value Ref Range Status   Specimen Description BLOOD PICC LINE  Final   Special Requests   Final    BOTTLES DRAWN AEROBIC AND ANAEROBIC Blood Culture results may not be optimal due to an excessive volume of blood received in culture bottles   Culture   Final    NO GROWTH 4  DAYS Performed at [facility], [address], [place], [place] [address]    Report Status PENDING  Incomplete    Radiology Studies: No results found.    Axyl Sitzman T. Cathie Bonnell [facility]  If 7PM-7AM, please contact night-coverage [email or web address] 03/15/2023, 1:18 PM

## 2023-03-15 NOTE — Progress Notes (Signed)
Initial Nutrition Assessment  DOCUMENTATION CODES:   Not applicable  INTERVENTION:  Evening snack   NUTRITION DIAGNOSIS:   Inadequate oral intake related to vomiting, nausea as evidenced by meal completion < 25%.    GOAL:   Patient will meet greater than or equal to 90% of their needs    MONITOR:   PO intake, Supplement acceptance, Labs, Weight trends  REASON FOR ASSESSMENT:   Consult Poor PO  ASSESSMENT:  77 y.o. F, Brought to ED from nursing home with reports of abdominal pain, distension with nausea and vomiting ~ 3days. Admitted for small bowel obstruction, Sepsis secondary to UTI, PMHX; Anemia, Arthritis, Crohn's disease, HTN, Stroke. ICA stenosis 8/24, 9/12-9/22 Brought from SNF due to poor p.o. intake and altered mental status. Review of EMR revealed average po intake 15-25%.  RD attempted to call x 2 with no answer.   Meds; Toprol-xl, Supplements; Ensure BID   Wt changes  03/11/23 52.1 kg  02/20/23 52.1 kg  02/10/23 54.6 kg  01/24/23 49.4 kg  01/15/23 49.4 kg  12/03/21 56.8 kg    NUTRITION - FOCUSED PHYSICAL EXAM:  Deferred   Diet Order:   Diet Order             Diet regular Room service appropriate? Yes; Fluid consistency: Thin  Diet effective now                   EDUCATION NEEDS:   Not appropriate for education at this time  Skin:     Last BM:  9/29  Height:   Ht Readings from Last 1 Encounters:  03/11/23 5\' 5"  (1.651 m)    Weight:   Wt Readings from Last 1 Encounters:  03/11/23 52.1 kg    Ideal Body Weight:  57 kg  BMI:  Body mass index is 19.1 kg/m.  Estimated Nutritional Needs:   Kcal:  1570-1830 kcal  Protein:  70-80 g  Fluid:  51ml/kcal    Ricardo Jericho, RDN, LDN

## 2023-03-16 ENCOUNTER — Other Ambulatory Visit: Payer: Self-pay | Admitting: *Deleted

## 2023-03-16 DIAGNOSIS — G9341 Metabolic encephalopathy: Secondary | ICD-10-CM

## 2023-03-16 DIAGNOSIS — E440 Moderate protein-calorie malnutrition: Secondary | ICD-10-CM | POA: Insufficient documentation

## 2023-03-16 LAB — CULTURE, BLOOD (ROUTINE X 2)
Culture: NO GROWTH
Culture: NO GROWTH

## 2023-03-16 MED ORDER — NITROFURANTOIN MONOHYD MACRO 100 MG PO CAPS: 100.0000 mg | ORAL_CAPSULE | Freq: Two times a day (BID) | ORAL | 0 refills | Status: DC

## 2023-03-16 MED ORDER — ASPIRIN 81 MG PO TBEC: 81.0000 mg | DELAYED_RELEASE_TABLET | Freq: Every day | ORAL | 0 refills | Status: DC

## 2023-03-16 NOTE — TOC Transition Note (Addendum)
Transition of Care [facility]) - CM/SW Discharge Note   Patient Details  Name: [name] MRN: [id number] Date of Birth: 12-22-1945  Transition of [facility]) CM/SW Contact:  Villa Herb, LCSWA Phone Number: 03/16/2023, 3:32 PM   Clinical Narrative:    CSW updated that pts SNF insurance Berkley Harvey has been denied. CSW spoke with pts daughter about this, she is understanding. Pts daughter plans to take pt back home. Pt will need hospital bed, MD placed orders. CSW spoke to [employer] rep Ian Malkin to give hospital bed referral. He will set up home delivery, CSW requested it happen today as pt is medically stable for D/C. CSW spoke to [place] with [facility] who accepts [facility] PT/OT referral at this time. MD placed all needed HH and DME orders. TOC signing off.   Final next level of care: Home w Home Health Services Barriers to Discharge: Barriers Resolved   Patient Goals and CMS Choice CMS Medicare.gov Compare Post Acute Care list provided to:: Patient Represenative (must comment) Choice offered to / list presented to : Adult Children  Discharge Placement                         Discharge Plan and Services Additional resources added to the After Visit Summary for   In-house Referral: Clinical Social Work Discharge Planning Services: CM Consult            DME Arranged: Hospital bed DME Agency: [facility] Date DME Agency Contacted: 03/16/23   Representative spoke with at DME Agency: Ian Malkin HH Arranged: PT, OT HH Agency: [facility] Date HH Agency Contacted: 03/16/23   Representative spoke with at [facility] Agency: Morrie Sheldon  Social Determinants of Health (SDOH) Interventions SDOH Screenings   Food Insecurity: No Food Insecurity (03/15/2023)  Housing: Low Risk  (03/15/2023)  Transportation Needs: No Transportation Needs (03/15/2023)  Utilities: Not At Risk (03/15/2023)  Tobacco Use: High Risk (03/11/2023)     Readmission Risk Interventions     No data to display

## 2023-03-16 NOTE — Consult Note (Signed)
[facility] Customer service manager [facility]) Accountable Care Organization (ACO) [facility] Liaison Note  03/16/2023  [name] July 25, 1945 [id number]  Location: [facility] Liaison screened the patient remotely at [facility].  Insurance: [employer]   [name] is a 77 y.o. female who is a Primary Care Patient of Assunta Found, MD [facility]). The patient was screened for 30 day readmission hospitalization with noted high risk score for unplanned readmission risk with 3 IP/2 ED in 6 months.  The patient was assessed for potential [facility]) Care Management service needs for post hospital transition for care coordination. Review of patient's electronic medical record reveals patient  For Acute Metabolic Encephalopathy. Liaison spoke with the pt's daughter [name]) who indicated pt's DME Bed was pending delivery prior to the pt's discharge today. Liaison educated on care management services and offered services. Daughter receptive to a post hospital prevention readmission follow up call. Pt denied SNF and will d/c with [facility] services along with DME delivery.   Plan: [facility] Liaison will continue to follow progress and disposition to asess for post hospital community care coordination/management needs.  Referral request for community care coordination: Will make a referral for care coordinator to assist with post hospital prevention readmission and management of care.   [facility] Care Management/Population Health does not replace or interfere with any arrangements made by the Inpatient Transition of Care team.   For questions contact:   Elliot Cousin, RN, [facility] Liaison [place]   Population Health Office Hours MTWF  8:00 am-6:00 pm [phone] mobile [phone] [Office toll free line] Office Hours are M-F 8:30 - 5 pm [email or web address]

## 2023-03-16 NOTE — Discharge Summary (Signed)
first [facility] joint. No acute fracture or dislocation is noted. No soft tissue changes are seen. IMPRESSION: No acute abnormality noted. Electronically Signed   By: Alcide Clever M.D.   On: 03/15/2023 23:39   CT ABDOMEN PELVIS W CONTRAST  Result Date: 03/11/2023 CLINICAL DATA:  Acute nonlocalized abdominal pain EXAM: CT ABDOMEN AND PELVIS WITH CONTRAST TECHNIQUE: Multidetector CT imaging of the abdomen and pelvis was performed using the standard protocol following bolus administration of intravenous contrast. RADIATION DOSE REDUCTION: This exam was performed according to the departmental dose-optimization program which includes automated exposure control, adjustment of the mA and/or kV according to patient size and/or use of iterative reconstruction technique. CONTRAST:  OMNIPAQUE IOHEXOL 300 MG/ML  SOLN COMPARISON:  02/19/2023 FINDINGS: Lower chest: No acute abnormality. Hepatobiliary: No focal liver abnormality is seen. Status post cholecystectomy. No biliary dilatation. Pancreas: Extensive calcification within the head of the pancreas in keeping with changes of chronic pancreatitis in this location. There is  preservation of the pancreatic parenchyma within the body and tail the pancreas. The pancreatic duct is not dilated. No peripancreatic inflammatory changes are identified to suggest acute pancreatitis Spleen: Unremarkable Adrenals/Urinary Tract: The adrenal glands are unremarkable. Tiny cortical hypodensities are seen within the kidneys bilaterally which are too small to accurately characterize but likely represent multiple cortical cysts. No follow-up imaging is recommended for these lesions. The kidneys are otherwise unremarkable. Foley catheter balloon seen within a decompressed bladder lumen. Stomach/Bowel: Previously noted small bowel obstruction has resolved. Moderate sigmoid diverticulosis. There is fluid seen within the distal colon and rectal vault in keeping with a diarrheal state and possibly reflective of underlying enterocolitis. Surgical changes of ileocecectomy are identified. The stomach, small bowel, and large bowel are otherwise unremarkable there is no evidence of obstruction or focal inflammation. No free intraperitoneal gas or fluid. Vascular/Lymphatic: Aortic atherosclerosis. No enlarged abdominal or pelvic lymph nodes. Reproductive: Uterus and bilateral adnexa are unremarkable. Other: No abdominal wall hernia or abnormality. No abdominopelvic ascites. Musculoskeletal: Left total hip arthroplasty. Osseous structures are diffusely osteopenic. No acute bone abnormality. IMPRESSION: 1. Fluid seen within the distal colon and rectal vault in keeping with a diarrheal state and possibly reflective of underlying enterocolitis. 2. Previously noted small bowel obstruction has resolved. 3. Moderate sigmoid diverticulosis. 4. Changes of chronic pancreatitis. No CT evidence of acute pancreatitis. Aortic Atherosclerosis (ICD10-I70.0). Electronically Signed   By: Helyn Numbers M.D.   On: 03/11/2023 21:36   VAS [place] UPPER EXTREMITY VENOUS DUPLEX  Result Date: 02/26/2023 UPPER VENOUS STUDY  Patient Name:   [name]  Date of Exam:   02/25/2023 Medical Rec #: [id number]     Accession #:    [id number] Date of Birth: [birth year]     Patient Gender: F Patient Age:   77 years Exam Location:  [facility] Procedure:      VAS [place] UPPER EXTREMITY VENOUS DUPLEX Referring Phys: Victorino Dike CHOI --------------------------------------------------------------------------------  Indications: Edema Risk Factors: Immobility. Limitations: Body habitus and Limited awareness/range of motion. Comparison Study: No prior study Performing Technologist: Shona Simpson  Examination Guidelines: A complete evaluation includes B-mode imaging, spectral Doppler, color Doppler, and power Doppler as needed of all accessible portions of each vessel. Bilateral testing is considered an integral part of a complete examination. Limited examinations for reoccurring indications may be performed as noted.  Right Findings: +----------+------------+---------+-----------+----------+-------+ RIGHT     CompressiblePhasicitySpontaneousPropertiesSummary +----------+------------+---------+-----------+----------+-------+ Subclavian    Full       Yes  Physician Discharge Summary   Patient: [name] MRN: [id number] DOB: 04/26/1946  Admit date:     03/11/2023  Discharge date: 03/16/23  Discharge Physician: Tyrone Nine   PCP: Assunta Found, MD   Recommendations at discharge:  Follow up with PCP in 1-2 weeks with repeat CBC, BMP.  Discharge Diagnoses: Principal Problem:   Acute metabolic encephalopathy Active Problems:   Crohn's disease (HCC)   HTN (hypertension)   Lower urinary tract infectious disease   Fungemia   GERD (gastroesophageal reflux disease)   History of stroke  Hospital Course: [name] is a 77 y.o. female with a history of Crohn's disease, HTN, CVA, recently treated fungemia who presented to the ED on 03/11/2023 from SNF for concern of poor oral intake and confusion. She had an x-ray done at her facility that showed moderate gaseous distention of the bowel loops compatible with adynamic ileus. Patient has a Foley in place and her last bowel movement was Monday. CT abdomen and pelvis done in the ER showed enterocolitis, resolved SBO, and chronic pancreatitis.    Her midline was discontinued on arrival since it seemed to have been displaced and no longer required IV therapy. Ceftriaxone for UTI started. She has had improvement in her mentation and appears stable for discharge. Her debility suggests SNF would be beneficial, though insurance has denied this and plan is for her daughter to take her home with maximal home health therapies and DME including hospital bed.   Assessment and Plan: Acute metabolic encephalopathy: Mentation seems to be improving since admission. UA concerning for UTI.  UA and urine culture suggests UTI.  B12 low at 169.  TSH slightly elevated but free T4 normal.  RPR, ammonia and VBG within normal.  Encephalopathy improved.  She is awake and alert and oriented to self, place and person but not time.  Follows command.  Likely at baseline. -Treat UTI and B12 deficiency as below   UTI: UA  consistent w/UTI and +suprapubic tenderness. Previous cultures grew Candida glabrata and E. coli that sensitive to ceftriaxone - Weaned XTC to macrobid, remains afebrile, improved. Will complete course after discharge.    Crohn's disease: - Continue sulfasalazine   History of stroke - Continue ASA, plavix. given her CVA and ICA stenosis, overall atherosclerotic burden, the risks of bleeding are outweighed by benefit of DAPT. There is no bleeding.    GERD: - Continue protonix   Hypokalemia: Resolved.   Fungemia: Recently discharged on 02/26/2023 to complete 2 weeks of micafungin 100mg  IV daily thru midline on 03/08/2023.  - Repeat blood cultures negative - Needs ophthalmology follow up to r/o endophthalmitis    HTN (hypertension) - Continue metoprolol 12.5 mg   History of pSBO: Abd benign currently, resolved obstructive signs radiographically.   hypophosphatemia: Resolved. -Monitor replenish as appropriate   Vitamin B12 deficiency: B12 169. -Vitamin B12 injection x 2 followed by p.o.  Consultants: None Procedures performed: None  Disposition: Home Diet recommendation: Regular DISCHARGE MEDICATION: Allergies as of 03/16/2023       Reactions   Flagyl [metronidazole Hcl] Other (See Comments)   "makes me feel whoozy and weird"   Other    Unknown antibiotic and unknown pain medications        Medication List     STOP taking these medications    MICAFUNGIN SODIUM IV   polyethylene glycol powder 17 GM/SCOOP powder Commonly known as: GLYCOLAX/MIRALAX   urea 15 g Pack oral packet Commonly known as: URE-NA  first [facility] joint. No acute fracture or dislocation is noted. No soft tissue changes are seen. IMPRESSION: No acute abnormality noted. Electronically Signed   By: Alcide Clever M.D.   On: 03/15/2023 23:39   CT ABDOMEN PELVIS W CONTRAST  Result Date: 03/11/2023 CLINICAL DATA:  Acute nonlocalized abdominal pain EXAM: CT ABDOMEN AND PELVIS WITH CONTRAST TECHNIQUE: Multidetector CT imaging of the abdomen and pelvis was performed using the standard protocol following bolus administration of intravenous contrast. RADIATION DOSE REDUCTION: This exam was performed according to the departmental dose-optimization program which includes automated exposure control, adjustment of the mA and/or kV according to patient size and/or use of iterative reconstruction technique. CONTRAST:  OMNIPAQUE IOHEXOL 300 MG/ML  SOLN COMPARISON:  02/19/2023 FINDINGS: Lower chest: No acute abnormality. Hepatobiliary: No focal liver abnormality is seen. Status post cholecystectomy. No biliary dilatation. Pancreas: Extensive calcification within the head of the pancreas in keeping with changes of chronic pancreatitis in this location. There is  preservation of the pancreatic parenchyma within the body and tail the pancreas. The pancreatic duct is not dilated. No peripancreatic inflammatory changes are identified to suggest acute pancreatitis Spleen: Unremarkable Adrenals/Urinary Tract: The adrenal glands are unremarkable. Tiny cortical hypodensities are seen within the kidneys bilaterally which are too small to accurately characterize but likely represent multiple cortical cysts. No follow-up imaging is recommended for these lesions. The kidneys are otherwise unremarkable. Foley catheter balloon seen within a decompressed bladder lumen. Stomach/Bowel: Previously noted small bowel obstruction has resolved. Moderate sigmoid diverticulosis. There is fluid seen within the distal colon and rectal vault in keeping with a diarrheal state and possibly reflective of underlying enterocolitis. Surgical changes of ileocecectomy are identified. The stomach, small bowel, and large bowel are otherwise unremarkable there is no evidence of obstruction or focal inflammation. No free intraperitoneal gas or fluid. Vascular/Lymphatic: Aortic atherosclerosis. No enlarged abdominal or pelvic lymph nodes. Reproductive: Uterus and bilateral adnexa are unremarkable. Other: No abdominal wall hernia or abnormality. No abdominopelvic ascites. Musculoskeletal: Left total hip arthroplasty. Osseous structures are diffusely osteopenic. No acute bone abnormality. IMPRESSION: 1. Fluid seen within the distal colon and rectal vault in keeping with a diarrheal state and possibly reflective of underlying enterocolitis. 2. Previously noted small bowel obstruction has resolved. 3. Moderate sigmoid diverticulosis. 4. Changes of chronic pancreatitis. No CT evidence of acute pancreatitis. Aortic Atherosclerosis (ICD10-I70.0). Electronically Signed   By: Helyn Numbers M.D.   On: 03/11/2023 21:36   VAS [place] UPPER EXTREMITY VENOUS DUPLEX  Result Date: 02/26/2023 UPPER VENOUS STUDY  Patient Name:   [name]  Date of Exam:   02/25/2023 Medical Rec #: [id number]     Accession #:    [id number] Date of Birth: [birth year]     Patient Gender: F Patient Age:   77 years Exam Location:  [facility] Procedure:      VAS [place] UPPER EXTREMITY VENOUS DUPLEX Referring Phys: Victorino Dike CHOI --------------------------------------------------------------------------------  Indications: Edema Risk Factors: Immobility. Limitations: Body habitus and Limited awareness/range of motion. Comparison Study: No prior study Performing Technologist: Shona Simpson  Examination Guidelines: A complete evaluation includes B-mode imaging, spectral Doppler, color Doppler, and power Doppler as needed of all accessible portions of each vessel. Bilateral testing is considered an integral part of a complete examination. Limited examinations for reoccurring indications may be performed as noted.  Right Findings: +----------+------------+---------+-----------+----------+-------+ RIGHT     CompressiblePhasicitySpontaneousPropertiesSummary +----------+------------+---------+-----------+----------+-------+ Subclavian    Full       Yes  Weight:       120.4 lb Date of Birth:  Mar 06, 1946    BSA:          1.594 m Patient Age:    83 years     BP:            131/65 mmHg Patient Gender: F            HR:           97 bpm. Exam Location:  Inpatient Procedure: 2D Echo, Color Doppler and Cardiac Doppler Indications:    Abnormal ECG  History:        Patient has prior history of Echocardiogram examinations, most                 recent 02/08/2023. Stroke; Risk Factors:Hypertension and Current                 Smoker.  Sonographer:    Milda Smart Referring Phys: [phone] DONALD WICKLINE  Sonographer Comments: Image acquisition challenging due to patient body habitus and Image acquisition challenging due to respiratory motion. IMPRESSIONS  1. Left ventricular ejection fraction, by estimation, is 65 to 70%. The left ventricle has normal function. The left ventricle has no regional wall motion abnormalities. Left ventricular diastolic parameters are consistent with Grade I diastolic dysfunction (impaired relaxation).  2. Right ventricular systolic function is normal. The right ventricular size is mildly enlarged. There is normal pulmonary artery systolic pressure. The estimated right ventricular systolic pressure is 19.6 mmHg.  3. The mitral valve is grossly normal. Trivial mitral valve regurgitation. No evidence of mitral stenosis.  4. The aortic valve is tricuspid. There is mild calcification of the aortic valve. There is mild thickening of the aortic valve. Aortic valve regurgitation is not visualized. Aortic valve sclerosis/calcification is present, without any evidence of aortic stenosis. Aortic valve area, by VTI measures 1.74 cm.  5. The inferior vena cava is normal in size with greater than 50% respiratory variability, suggesting right atrial pressure of 3 mmHg. Comparison(s): No significant change from prior study. FINDINGS  Left Ventricle: Left ventricular ejection fraction, by estimation, is 65 to 70%. The left ventricle has normal function. The left ventricle has no regional wall motion abnormalities. The left ventricular internal cavity size was normal in size. There  is  no left ventricular hypertrophy. Left ventricular diastolic parameters are consistent with Grade I diastolic dysfunction (impaired relaxation). Right Ventricle: The right ventricular size is mildly enlarged. No increase in right ventricular wall thickness. Right ventricular systolic function is normal. There is normal pulmonary artery systolic pressure. The tricuspid regurgitant velocity is 2.04  m/s, and with an assumed right atrial pressure of 3 mmHg, the estimated right ventricular systolic pressure is 19.6 mmHg. Left Atrium: Left atrial size was normal in size. Right Atrium: Right atrial size was normal in size. Pericardium: There is no evidence of pericardial effusion. Presence of epicardial fat layer. Mitral Valve: The mitral valve is grossly normal. Trivial mitral valve regurgitation. No evidence of mitral valve stenosis. MV peak gradient, 7.2 mmHg. The mean mitral valve gradient is 3.0 mmHg. Tricuspid Valve: The tricuspid valve is grossly normal. Tricuspid valve regurgitation is trivial. No evidence of tricuspid stenosis. The flow in the hepatic veins is normal during ventricular systole. Aortic Valve: The aortic valve is tricuspid. There is mild calcification of the aortic valve. There is mild thickening of the aortic valve. Aortic valve regurgitation is not visualized. Aortic valve sclerosis/calcification is present, without any evidence of aortic  Physician Discharge Summary   Patient: [name] MRN: [id number] DOB: 04/26/1946  Admit date:     03/11/2023  Discharge date: 03/16/23  Discharge Physician: Tyrone Nine   PCP: Assunta Found, MD   Recommendations at discharge:  Follow up with PCP in 1-2 weeks with repeat CBC, BMP.  Discharge Diagnoses: Principal Problem:   Acute metabolic encephalopathy Active Problems:   Crohn's disease (HCC)   HTN (hypertension)   Lower urinary tract infectious disease   Fungemia   GERD (gastroesophageal reflux disease)   History of stroke  Hospital Course: [name] is a 77 y.o. female with a history of Crohn's disease, HTN, CVA, recently treated fungemia who presented to the ED on 03/11/2023 from SNF for concern of poor oral intake and confusion. She had an x-ray done at her facility that showed moderate gaseous distention of the bowel loops compatible with adynamic ileus. Patient has a Foley in place and her last bowel movement was Monday. CT abdomen and pelvis done in the ER showed enterocolitis, resolved SBO, and chronic pancreatitis.    Her midline was discontinued on arrival since it seemed to have been displaced and no longer required IV therapy. Ceftriaxone for UTI started. She has had improvement in her mentation and appears stable for discharge. Her debility suggests SNF would be beneficial, though insurance has denied this and plan is for her daughter to take her home with maximal home health therapies and DME including hospital bed.   Assessment and Plan: Acute metabolic encephalopathy: Mentation seems to be improving since admission. UA concerning for UTI.  UA and urine culture suggests UTI.  B12 low at 169.  TSH slightly elevated but free T4 normal.  RPR, ammonia and VBG within normal.  Encephalopathy improved.  She is awake and alert and oriented to self, place and person but not time.  Follows command.  Likely at baseline. -Treat UTI and B12 deficiency as below   UTI: UA  consistent w/UTI and +suprapubic tenderness. Previous cultures grew Candida glabrata and E. coli that sensitive to ceftriaxone - Weaned XTC to macrobid, remains afebrile, improved. Will complete course after discharge.    Crohn's disease: - Continue sulfasalazine   History of stroke - Continue ASA, plavix. given her CVA and ICA stenosis, overall atherosclerotic burden, the risks of bleeding are outweighed by benefit of DAPT. There is no bleeding.    GERD: - Continue protonix   Hypokalemia: Resolved.   Fungemia: Recently discharged on 02/26/2023 to complete 2 weeks of micafungin 100mg  IV daily thru midline on 03/08/2023.  - Repeat blood cultures negative - Needs ophthalmology follow up to r/o endophthalmitis    HTN (hypertension) - Continue metoprolol 12.5 mg   History of pSBO: Abd benign currently, resolved obstructive signs radiographically.   hypophosphatemia: Resolved. -Monitor replenish as appropriate   Vitamin B12 deficiency: B12 169. -Vitamin B12 injection x 2 followed by p.o.  Consultants: None Procedures performed: None  Disposition: Home Diet recommendation: Regular DISCHARGE MEDICATION: Allergies as of 03/16/2023       Reactions   Flagyl [metronidazole Hcl] Other (See Comments)   "makes me feel whoozy and weird"   Other    Unknown antibiotic and unknown pain medications        Medication List     STOP taking these medications    MICAFUNGIN SODIUM IV   polyethylene glycol powder 17 GM/SCOOP powder Commonly known as: GLYCOLAX/MIRALAX   urea 15 g Pack oral packet Commonly known as: URE-NA  Physician Discharge Summary   Patient: [name] MRN: [id number] DOB: 04/26/1946  Admit date:     03/11/2023  Discharge date: 03/16/23  Discharge Physician: Tyrone Nine   PCP: Assunta Found, MD   Recommendations at discharge:  Follow up with PCP in 1-2 weeks with repeat CBC, BMP.  Discharge Diagnoses: Principal Problem:   Acute metabolic encephalopathy Active Problems:   Crohn's disease (HCC)   HTN (hypertension)   Lower urinary tract infectious disease   Fungemia   GERD (gastroesophageal reflux disease)   History of stroke  Hospital Course: [name] is a 77 y.o. female with a history of Crohn's disease, HTN, CVA, recently treated fungemia who presented to the ED on 03/11/2023 from SNF for concern of poor oral intake and confusion. She had an x-ray done at her facility that showed moderate gaseous distention of the bowel loops compatible with adynamic ileus. Patient has a Foley in place and her last bowel movement was Monday. CT abdomen and pelvis done in the ER showed enterocolitis, resolved SBO, and chronic pancreatitis.    Her midline was discontinued on arrival since it seemed to have been displaced and no longer required IV therapy. Ceftriaxone for UTI started. She has had improvement in her mentation and appears stable for discharge. Her debility suggests SNF would be beneficial, though insurance has denied this and plan is for her daughter to take her home with maximal home health therapies and DME including hospital bed.   Assessment and Plan: Acute metabolic encephalopathy: Mentation seems to be improving since admission. UA concerning for UTI.  UA and urine culture suggests UTI.  B12 low at 169.  TSH slightly elevated but free T4 normal.  RPR, ammonia and VBG within normal.  Encephalopathy improved.  She is awake and alert and oriented to self, place and person but not time.  Follows command.  Likely at baseline. -Treat UTI and B12 deficiency as below   UTI: UA  consistent w/UTI and +suprapubic tenderness. Previous cultures grew Candida glabrata and E. coli that sensitive to ceftriaxone - Weaned XTC to macrobid, remains afebrile, improved. Will complete course after discharge.    Crohn's disease: - Continue sulfasalazine   History of stroke - Continue ASA, plavix. given her CVA and ICA stenosis, overall atherosclerotic burden, the risks of bleeding are outweighed by benefit of DAPT. There is no bleeding.    GERD: - Continue protonix   Hypokalemia: Resolved.   Fungemia: Recently discharged on 02/26/2023 to complete 2 weeks of micafungin 100mg  IV daily thru midline on 03/08/2023.  - Repeat blood cultures negative - Needs ophthalmology follow up to r/o endophthalmitis    HTN (hypertension) - Continue metoprolol 12.5 mg   History of pSBO: Abd benign currently, resolved obstructive signs radiographically.   hypophosphatemia: Resolved. -Monitor replenish as appropriate   Vitamin B12 deficiency: B12 169. -Vitamin B12 injection x 2 followed by p.o.  Consultants: None Procedures performed: None  Disposition: Home Diet recommendation: Regular DISCHARGE MEDICATION: Allergies as of 03/16/2023       Reactions   Flagyl [metronidazole Hcl] Other (See Comments)   "makes me feel whoozy and weird"   Other    Unknown antibiotic and unknown pain medications        Medication List     STOP taking these medications    MICAFUNGIN SODIUM IV   polyethylene glycol powder 17 GM/SCOOP powder Commonly known as: GLYCOLAX/MIRALAX   urea 15 g Pack oral packet Commonly known as: URE-NA  first [facility] joint. No acute fracture or dislocation is noted. No soft tissue changes are seen. IMPRESSION: No acute abnormality noted. Electronically Signed   By: Alcide Clever M.D.   On: 03/15/2023 23:39   CT ABDOMEN PELVIS W CONTRAST  Result Date: 03/11/2023 CLINICAL DATA:  Acute nonlocalized abdominal pain EXAM: CT ABDOMEN AND PELVIS WITH CONTRAST TECHNIQUE: Multidetector CT imaging of the abdomen and pelvis was performed using the standard protocol following bolus administration of intravenous contrast. RADIATION DOSE REDUCTION: This exam was performed according to the departmental dose-optimization program which includes automated exposure control, adjustment of the mA and/or kV according to patient size and/or use of iterative reconstruction technique. CONTRAST:  OMNIPAQUE IOHEXOL 300 MG/ML  SOLN COMPARISON:  02/19/2023 FINDINGS: Lower chest: No acute abnormality. Hepatobiliary: No focal liver abnormality is seen. Status post cholecystectomy. No biliary dilatation. Pancreas: Extensive calcification within the head of the pancreas in keeping with changes of chronic pancreatitis in this location. There is  preservation of the pancreatic parenchyma within the body and tail the pancreas. The pancreatic duct is not dilated. No peripancreatic inflammatory changes are identified to suggest acute pancreatitis Spleen: Unremarkable Adrenals/Urinary Tract: The adrenal glands are unremarkable. Tiny cortical hypodensities are seen within the kidneys bilaterally which are too small to accurately characterize but likely represent multiple cortical cysts. No follow-up imaging is recommended for these lesions. The kidneys are otherwise unremarkable. Foley catheter balloon seen within a decompressed bladder lumen. Stomach/Bowel: Previously noted small bowel obstruction has resolved. Moderate sigmoid diverticulosis. There is fluid seen within the distal colon and rectal vault in keeping with a diarrheal state and possibly reflective of underlying enterocolitis. Surgical changes of ileocecectomy are identified. The stomach, small bowel, and large bowel are otherwise unremarkable there is no evidence of obstruction or focal inflammation. No free intraperitoneal gas or fluid. Vascular/Lymphatic: Aortic atherosclerosis. No enlarged abdominal or pelvic lymph nodes. Reproductive: Uterus and bilateral adnexa are unremarkable. Other: No abdominal wall hernia or abnormality. No abdominopelvic ascites. Musculoskeletal: Left total hip arthroplasty. Osseous structures are diffusely osteopenic. No acute bone abnormality. IMPRESSION: 1. Fluid seen within the distal colon and rectal vault in keeping with a diarrheal state and possibly reflective of underlying enterocolitis. 2. Previously noted small bowel obstruction has resolved. 3. Moderate sigmoid diverticulosis. 4. Changes of chronic pancreatitis. No CT evidence of acute pancreatitis. Aortic Atherosclerosis (ICD10-I70.0). Electronically Signed   By: Helyn Numbers M.D.   On: 03/11/2023 21:36   VAS [place] UPPER EXTREMITY VENOUS DUPLEX  Result Date: 02/26/2023 UPPER VENOUS STUDY  Patient Name:   [name]  Date of Exam:   02/25/2023 Medical Rec #: [id number]     Accession #:    [id number] Date of Birth: [birth year]     Patient Gender: F Patient Age:   77 years Exam Location:  [facility] Procedure:      VAS [place] UPPER EXTREMITY VENOUS DUPLEX Referring Phys: Victorino Dike CHOI --------------------------------------------------------------------------------  Indications: Edema Risk Factors: Immobility. Limitations: Body habitus and Limited awareness/range of motion. Comparison Study: No prior study Performing Technologist: Shona Simpson  Examination Guidelines: A complete evaluation includes B-mode imaging, spectral Doppler, color Doppler, and power Doppler as needed of all accessible portions of each vessel. Bilateral testing is considered an integral part of a complete examination. Limited examinations for reoccurring indications may be performed as noted.  Right Findings: +----------+------------+---------+-----------+----------+-------+ RIGHT     CompressiblePhasicitySpontaneousPropertiesSummary +----------+------------+---------+-----------+----------+-------+ Subclavian    Full       Yes  first [facility] joint. No acute fracture or dislocation is noted. No soft tissue changes are seen. IMPRESSION: No acute abnormality noted. Electronically Signed   By: Alcide Clever M.D.   On: 03/15/2023 23:39   CT ABDOMEN PELVIS W CONTRAST  Result Date: 03/11/2023 CLINICAL DATA:  Acute nonlocalized abdominal pain EXAM: CT ABDOMEN AND PELVIS WITH CONTRAST TECHNIQUE: Multidetector CT imaging of the abdomen and pelvis was performed using the standard protocol following bolus administration of intravenous contrast. RADIATION DOSE REDUCTION: This exam was performed according to the departmental dose-optimization program which includes automated exposure control, adjustment of the mA and/or kV according to patient size and/or use of iterative reconstruction technique. CONTRAST:  OMNIPAQUE IOHEXOL 300 MG/ML  SOLN COMPARISON:  02/19/2023 FINDINGS: Lower chest: No acute abnormality. Hepatobiliary: No focal liver abnormality is seen. Status post cholecystectomy. No biliary dilatation. Pancreas: Extensive calcification within the head of the pancreas in keeping with changes of chronic pancreatitis in this location. There is  preservation of the pancreatic parenchyma within the body and tail the pancreas. The pancreatic duct is not dilated. No peripancreatic inflammatory changes are identified to suggest acute pancreatitis Spleen: Unremarkable Adrenals/Urinary Tract: The adrenal glands are unremarkable. Tiny cortical hypodensities are seen within the kidneys bilaterally which are too small to accurately characterize but likely represent multiple cortical cysts. No follow-up imaging is recommended for these lesions. The kidneys are otherwise unremarkable. Foley catheter balloon seen within a decompressed bladder lumen. Stomach/Bowel: Previously noted small bowel obstruction has resolved. Moderate sigmoid diverticulosis. There is fluid seen within the distal colon and rectal vault in keeping with a diarrheal state and possibly reflective of underlying enterocolitis. Surgical changes of ileocecectomy are identified. The stomach, small bowel, and large bowel are otherwise unremarkable there is no evidence of obstruction or focal inflammation. No free intraperitoneal gas or fluid. Vascular/Lymphatic: Aortic atherosclerosis. No enlarged abdominal or pelvic lymph nodes. Reproductive: Uterus and bilateral adnexa are unremarkable. Other: No abdominal wall hernia or abnormality. No abdominopelvic ascites. Musculoskeletal: Left total hip arthroplasty. Osseous structures are diffusely osteopenic. No acute bone abnormality. IMPRESSION: 1. Fluid seen within the distal colon and rectal vault in keeping with a diarrheal state and possibly reflective of underlying enterocolitis. 2. Previously noted small bowel obstruction has resolved. 3. Moderate sigmoid diverticulosis. 4. Changes of chronic pancreatitis. No CT evidence of acute pancreatitis. Aortic Atherosclerosis (ICD10-I70.0). Electronically Signed   By: Helyn Numbers M.D.   On: 03/11/2023 21:36   VAS [place] UPPER EXTREMITY VENOUS DUPLEX  Result Date: 02/26/2023 UPPER VENOUS STUDY  Patient Name:   [name]  Date of Exam:   02/25/2023 Medical Rec #: [id number]     Accession #:    [id number] Date of Birth: [birth year]     Patient Gender: F Patient Age:   77 years Exam Location:  [facility] Procedure:      VAS [place] UPPER EXTREMITY VENOUS DUPLEX Referring Phys: Victorino Dike CHOI --------------------------------------------------------------------------------  Indications: Edema Risk Factors: Immobility. Limitations: Body habitus and Limited awareness/range of motion. Comparison Study: No prior study Performing Technologist: Shona Simpson  Examination Guidelines: A complete evaluation includes B-mode imaging, spectral Doppler, color Doppler, and power Doppler as needed of all accessible portions of each vessel. Bilateral testing is considered an integral part of a complete examination. Limited examinations for reoccurring indications may be performed as noted.  Right Findings: +----------+------------+---------+-----------+----------+-------+ RIGHT     CompressiblePhasicitySpontaneousPropertiesSummary +----------+------------+---------+-----------+----------+-------+ Subclavian    Full       Yes  first [facility] joint. No acute fracture or dislocation is noted. No soft tissue changes are seen. IMPRESSION: No acute abnormality noted. Electronically Signed   By: Alcide Clever M.D.   On: 03/15/2023 23:39   CT ABDOMEN PELVIS W CONTRAST  Result Date: 03/11/2023 CLINICAL DATA:  Acute nonlocalized abdominal pain EXAM: CT ABDOMEN AND PELVIS WITH CONTRAST TECHNIQUE: Multidetector CT imaging of the abdomen and pelvis was performed using the standard protocol following bolus administration of intravenous contrast. RADIATION DOSE REDUCTION: This exam was performed according to the departmental dose-optimization program which includes automated exposure control, adjustment of the mA and/or kV according to patient size and/or use of iterative reconstruction technique. CONTRAST:  OMNIPAQUE IOHEXOL 300 MG/ML  SOLN COMPARISON:  02/19/2023 FINDINGS: Lower chest: No acute abnormality. Hepatobiliary: No focal liver abnormality is seen. Status post cholecystectomy. No biliary dilatation. Pancreas: Extensive calcification within the head of the pancreas in keeping with changes of chronic pancreatitis in this location. There is  preservation of the pancreatic parenchyma within the body and tail the pancreas. The pancreatic duct is not dilated. No peripancreatic inflammatory changes are identified to suggest acute pancreatitis Spleen: Unremarkable Adrenals/Urinary Tract: The adrenal glands are unremarkable. Tiny cortical hypodensities are seen within the kidneys bilaterally which are too small to accurately characterize but likely represent multiple cortical cysts. No follow-up imaging is recommended for these lesions. The kidneys are otherwise unremarkable. Foley catheter balloon seen within a decompressed bladder lumen. Stomach/Bowel: Previously noted small bowel obstruction has resolved. Moderate sigmoid diverticulosis. There is fluid seen within the distal colon and rectal vault in keeping with a diarrheal state and possibly reflective of underlying enterocolitis. Surgical changes of ileocecectomy are identified. The stomach, small bowel, and large bowel are otherwise unremarkable there is no evidence of obstruction or focal inflammation. No free intraperitoneal gas or fluid. Vascular/Lymphatic: Aortic atherosclerosis. No enlarged abdominal or pelvic lymph nodes. Reproductive: Uterus and bilateral adnexa are unremarkable. Other: No abdominal wall hernia or abnormality. No abdominopelvic ascites. Musculoskeletal: Left total hip arthroplasty. Osseous structures are diffusely osteopenic. No acute bone abnormality. IMPRESSION: 1. Fluid seen within the distal colon and rectal vault in keeping with a diarrheal state and possibly reflective of underlying enterocolitis. 2. Previously noted small bowel obstruction has resolved. 3. Moderate sigmoid diverticulosis. 4. Changes of chronic pancreatitis. No CT evidence of acute pancreatitis. Aortic Atherosclerosis (ICD10-I70.0). Electronically Signed   By: Helyn Numbers M.D.   On: 03/11/2023 21:36   VAS [place] UPPER EXTREMITY VENOUS DUPLEX  Result Date: 02/26/2023 UPPER VENOUS STUDY  Patient Name:   [name]  Date of Exam:   02/25/2023 Medical Rec #: [id number]     Accession #:    [id number] Date of Birth: [birth year]     Patient Gender: F Patient Age:   77 years Exam Location:  [facility] Procedure:      VAS [place] UPPER EXTREMITY VENOUS DUPLEX Referring Phys: Victorino Dike CHOI --------------------------------------------------------------------------------  Indications: Edema Risk Factors: Immobility. Limitations: Body habitus and Limited awareness/range of motion. Comparison Study: No prior study Performing Technologist: Shona Simpson  Examination Guidelines: A complete evaluation includes B-mode imaging, spectral Doppler, color Doppler, and power Doppler as needed of all accessible portions of each vessel. Bilateral testing is considered an integral part of a complete examination. Limited examinations for reoccurring indications may be performed as noted.  Right Findings: +----------+------------+---------+-----------+----------+-------+ RIGHT     CompressiblePhasicitySpontaneousPropertiesSummary +----------+------------+---------+-----------+----------+-------+ Subclavian    Full       Yes

## 2023-03-16 NOTE — Care Management Important Message (Signed)
Important Message  Patient Details  Name: [name] MRN: [id number] Date of Birth: [birth year]   Important Message Given:  Yes - Medicare IM (spoke with daughter [name] at [phone] to review letter, no additonal copy needed)     Corey Harold 03/16/2023, 11:06 AM

## 2023-03-16 NOTE — Progress Notes (Signed)
Patient requires frequent re-positioning of the body in ways that cannot be achieved with an ordinary bed or wedge pillow, to eliminate pain, reduce pressure, and the head of the bed to be elevated more than 30 degrees most of the time due to stroke history.

## 2023-03-17 NOTE — Progress Notes (Signed)
Patient seen and examined this morning, reporting she needs to use the restroom, requesting NT assistance. Denies any other issues. She was discharged yesterday with plans for home health and DME. Hospital bed was not able to be delivered until this morning. She remains stable for discharge today with no additional changes to discharge summary as dictated 03/16/2023.  Hazeline Junker, MD 03/17/2023 9:17 AM

## 2023-03-18 DIAGNOSIS — Z79891 Long term (current) use of opiate analgesic: Secondary | ICD-10-CM | POA: Diagnosis not present

## 2023-03-18 DIAGNOSIS — K861 Other chronic pancreatitis: Secondary | ICD-10-CM | POA: Diagnosis not present

## 2023-03-18 DIAGNOSIS — N390 Urinary tract infection, site not specified: Secondary | ICD-10-CM | POA: Diagnosis not present

## 2023-03-18 DIAGNOSIS — Z7902 Long term (current) use of antithrombotics/antiplatelets: Secondary | ICD-10-CM | POA: Diagnosis not present

## 2023-03-18 DIAGNOSIS — I700 Atherosclerosis of aorta: Secondary | ICD-10-CM | POA: Diagnosis not present

## 2023-03-18 DIAGNOSIS — M5116 Intervertebral disc disorders with radiculopathy, lumbar region: Secondary | ICD-10-CM | POA: Diagnosis not present

## 2023-03-18 DIAGNOSIS — B952 Enterococcus as the cause of diseases classified elsewhere: Secondary | ICD-10-CM | POA: Diagnosis not present

## 2023-03-18 DIAGNOSIS — D649 Anemia, unspecified: Secondary | ICD-10-CM | POA: Diagnosis not present

## 2023-03-18 DIAGNOSIS — E538 Deficiency of other specified B group vitamins: Secondary | ICD-10-CM | POA: Diagnosis not present

## 2023-03-18 DIAGNOSIS — K219 Gastro-esophageal reflux disease without esophagitis: Secondary | ICD-10-CM | POA: Diagnosis not present

## 2023-03-18 DIAGNOSIS — B49 Unspecified mycosis: Secondary | ICD-10-CM | POA: Diagnosis not present

## 2023-03-18 DIAGNOSIS — K5730 Diverticulosis of large intestine without perforation or abscess without bleeding: Secondary | ICD-10-CM | POA: Diagnosis not present

## 2023-03-18 DIAGNOSIS — F17210 Nicotine dependence, cigarettes, uncomplicated: Secondary | ICD-10-CM | POA: Diagnosis not present

## 2023-03-18 DIAGNOSIS — I69354 Hemiplegia and hemiparesis following cerebral infarction affecting left non-dominant side: Secondary | ICD-10-CM | POA: Diagnosis not present

## 2023-03-18 DIAGNOSIS — K5090 Crohn's disease, unspecified, without complications: Secondary | ICD-10-CM | POA: Diagnosis not present

## 2023-03-18 DIAGNOSIS — Z7982 Long term (current) use of aspirin: Secondary | ICD-10-CM | POA: Diagnosis not present

## 2023-03-18 DIAGNOSIS — G9341 Metabolic encephalopathy: Secondary | ICD-10-CM | POA: Diagnosis not present

## 2023-03-18 DIAGNOSIS — Z556 Problems related to health literacy: Secondary | ICD-10-CM | POA: Diagnosis not present

## 2023-03-18 DIAGNOSIS — Z960 Presence of urogenital implants: Secondary | ICD-10-CM | POA: Diagnosis not present

## 2023-03-18 DIAGNOSIS — I10 Essential (primary) hypertension: Secondary | ICD-10-CM | POA: Diagnosis not present

## 2023-03-19 ENCOUNTER — Telehealth: Payer: Self-pay | Admitting: *Deleted

## 2023-03-19 NOTE — Progress Notes (Signed)
Care Coordination   Note   03/19/2023 Name: TAMITHA ROOKE MRN: [id number] DOB: [birth year]  LOKI ESCARCEGA is a 77 y.o. year old female who sees Assunta Found, MD for primary care. I reached out to [name] by phone today to offer care coordination services.  Ms. Zamboni was given information about Care Coordination services today including:   The Care Coordination services include support from the care team which includes your Nurse Coordinator, Clinical Social Worker, or Pharmacist.  The Care Coordination team is here to help remove barriers to the health concerns and goals most important to you. Care Coordination services are voluntary, and the patient may decline or stop services at any time by request to their care team member.   Care Coordination Consent Status: Patient/Daughter - Kitty - Dpr on file; agreed to services and verbal consent obtained.   Follow up plan:  Telephone appointment with care coordination team member scheduled for:  03/31/23  Encounter Outcome:  Patient Scheduled [facility] Coordination Care Guide  Direct Dial: [phone]

## 2023-03-20 ENCOUNTER — Telehealth: Payer: Self-pay

## 2023-03-20 ENCOUNTER — Inpatient Hospital Stay: Payer: Medicare Other | Admitting: Internal Medicine

## 2023-03-20 DIAGNOSIS — I10 Essential (primary) hypertension: Secondary | ICD-10-CM | POA: Diagnosis not present

## 2023-03-20 DIAGNOSIS — I700 Atherosclerosis of aorta: Secondary | ICD-10-CM | POA: Diagnosis not present

## 2023-03-20 DIAGNOSIS — B49 Unspecified mycosis: Secondary | ICD-10-CM | POA: Diagnosis not present

## 2023-03-20 DIAGNOSIS — K5730 Diverticulosis of large intestine without perforation or abscess without bleeding: Secondary | ICD-10-CM | POA: Diagnosis not present

## 2023-03-20 DIAGNOSIS — I69354 Hemiplegia and hemiparesis following cerebral infarction affecting left non-dominant side: Secondary | ICD-10-CM | POA: Diagnosis not present

## 2023-03-20 DIAGNOSIS — Z556 Problems related to health literacy: Secondary | ICD-10-CM | POA: Diagnosis not present

## 2023-03-20 DIAGNOSIS — K5090 Crohn's disease, unspecified, without complications: Secondary | ICD-10-CM | POA: Diagnosis not present

## 2023-03-20 DIAGNOSIS — K861 Other chronic pancreatitis: Secondary | ICD-10-CM | POA: Diagnosis not present

## 2023-03-20 DIAGNOSIS — Z7902 Long term (current) use of antithrombotics/antiplatelets: Secondary | ICD-10-CM | POA: Diagnosis not present

## 2023-03-20 DIAGNOSIS — Z960 Presence of urogenital implants: Secondary | ICD-10-CM | POA: Diagnosis not present

## 2023-03-20 DIAGNOSIS — N390 Urinary tract infection, site not specified: Secondary | ICD-10-CM | POA: Diagnosis not present

## 2023-03-20 DIAGNOSIS — F17210 Nicotine dependence, cigarettes, uncomplicated: Secondary | ICD-10-CM | POA: Diagnosis not present

## 2023-03-20 DIAGNOSIS — Z7982 Long term (current) use of aspirin: Secondary | ICD-10-CM | POA: Diagnosis not present

## 2023-03-20 DIAGNOSIS — Z79891 Long term (current) use of opiate analgesic: Secondary | ICD-10-CM | POA: Diagnosis not present

## 2023-03-20 DIAGNOSIS — K219 Gastro-esophageal reflux disease without esophagitis: Secondary | ICD-10-CM | POA: Diagnosis not present

## 2023-03-20 DIAGNOSIS — M5116 Intervertebral disc disorders with radiculopathy, lumbar region: Secondary | ICD-10-CM | POA: Diagnosis not present

## 2023-03-20 DIAGNOSIS — G9341 Metabolic encephalopathy: Secondary | ICD-10-CM | POA: Diagnosis not present

## 2023-03-20 DIAGNOSIS — D649 Anemia, unspecified: Secondary | ICD-10-CM | POA: Diagnosis not present

## 2023-03-20 DIAGNOSIS — E538 Deficiency of other specified B group vitamins: Secondary | ICD-10-CM | POA: Diagnosis not present

## 2023-03-20 DIAGNOSIS — B952 Enterococcus as the cause of diseases classified elsewhere: Secondary | ICD-10-CM | POA: Diagnosis not present

## 2023-03-20 NOTE — Telephone Encounter (Signed)
Attempted to reach daughter [name] [phone] to reschedule NO SHOW 03/20/23.Left vm to reschedule appointment.

## 2023-03-23 ENCOUNTER — Telehealth: Payer: Self-pay

## 2023-03-23 NOTE — Telephone Encounter (Signed)
Spoke with patient's daughter in law, relayed that Dr. Renold Don wants to make sure she follows up with the eye doctor for dilated eye exam to make sure there is no issue with candida involvement in the eye.   Relayed she can follow up with ID as needed and asked her to call if patient experiences fever, chills, worsening joint/back pain.   Sandie Ano, RN

## 2023-03-23 NOTE — Telephone Encounter (Signed)
Patient's daughter (DPR) and daughter-in-law called with questions about Jessyca's medication. They want to know what medication our office is managing, discussed that Ladonne was discharged home on IV micafungin and that she missed her hospital follow up to see if any additional management was needed.   They state Toia was discharged home without a midline and that she is unable to get out of the bed to come to an appointment. They do not have a computer to do a virtual appointment.   As far as her being discharged without a midline, it appears this was removed during a separate ED encounter after micafungin course had been completed.   Will route to provider.   Sandie Ano, RN

## 2023-03-24 ENCOUNTER — Ambulatory Visit (INDEPENDENT_AMBULATORY_CARE_PROVIDER_SITE_OTHER): Payer: Medicare Other | Admitting: Gastroenterology

## 2023-03-24 DIAGNOSIS — B952 Enterococcus as the cause of diseases classified elsewhere: Secondary | ICD-10-CM | POA: Diagnosis not present

## 2023-03-24 DIAGNOSIS — D649 Anemia, unspecified: Secondary | ICD-10-CM | POA: Diagnosis not present

## 2023-03-24 DIAGNOSIS — I10 Essential (primary) hypertension: Secondary | ICD-10-CM | POA: Diagnosis not present

## 2023-03-24 DIAGNOSIS — F17210 Nicotine dependence, cigarettes, uncomplicated: Secondary | ICD-10-CM | POA: Diagnosis not present

## 2023-03-24 DIAGNOSIS — I69354 Hemiplegia and hemiparesis following cerebral infarction affecting left non-dominant side: Secondary | ICD-10-CM | POA: Diagnosis not present

## 2023-03-24 DIAGNOSIS — Z556 Problems related to health literacy: Secondary | ICD-10-CM | POA: Diagnosis not present

## 2023-03-24 DIAGNOSIS — M5116 Intervertebral disc disorders with radiculopathy, lumbar region: Secondary | ICD-10-CM | POA: Diagnosis not present

## 2023-03-24 DIAGNOSIS — K219 Gastro-esophageal reflux disease without esophagitis: Secondary | ICD-10-CM | POA: Diagnosis not present

## 2023-03-24 DIAGNOSIS — Z7982 Long term (current) use of aspirin: Secondary | ICD-10-CM | POA: Diagnosis not present

## 2023-03-24 DIAGNOSIS — Z7902 Long term (current) use of antithrombotics/antiplatelets: Secondary | ICD-10-CM | POA: Diagnosis not present

## 2023-03-24 DIAGNOSIS — K5090 Crohn's disease, unspecified, without complications: Secondary | ICD-10-CM | POA: Diagnosis not present

## 2023-03-24 DIAGNOSIS — Z79891 Long term (current) use of opiate analgesic: Secondary | ICD-10-CM | POA: Diagnosis not present

## 2023-03-24 DIAGNOSIS — K5730 Diverticulosis of large intestine without perforation or abscess without bleeding: Secondary | ICD-10-CM | POA: Diagnosis not present

## 2023-03-24 DIAGNOSIS — K861 Other chronic pancreatitis: Secondary | ICD-10-CM | POA: Diagnosis not present

## 2023-03-24 DIAGNOSIS — G9341 Metabolic encephalopathy: Secondary | ICD-10-CM | POA: Diagnosis not present

## 2023-03-24 DIAGNOSIS — I700 Atherosclerosis of aorta: Secondary | ICD-10-CM | POA: Diagnosis not present

## 2023-03-24 DIAGNOSIS — E538 Deficiency of other specified B group vitamins: Secondary | ICD-10-CM | POA: Diagnosis not present

## 2023-03-24 DIAGNOSIS — B49 Unspecified mycosis: Secondary | ICD-10-CM | POA: Diagnosis not present

## 2023-03-24 DIAGNOSIS — Z960 Presence of urogenital implants: Secondary | ICD-10-CM | POA: Diagnosis not present

## 2023-03-24 DIAGNOSIS — N390 Urinary tract infection, site not specified: Secondary | ICD-10-CM | POA: Diagnosis not present

## 2023-03-25 DIAGNOSIS — K5730 Diverticulosis of large intestine without perforation or abscess without bleeding: Secondary | ICD-10-CM | POA: Diagnosis not present

## 2023-03-25 DIAGNOSIS — B49 Unspecified mycosis: Secondary | ICD-10-CM | POA: Diagnosis not present

## 2023-03-25 DIAGNOSIS — Z556 Problems related to health literacy: Secondary | ICD-10-CM | POA: Diagnosis not present

## 2023-03-25 DIAGNOSIS — M5116 Intervertebral disc disorders with radiculopathy, lumbar region: Secondary | ICD-10-CM | POA: Diagnosis not present

## 2023-03-25 DIAGNOSIS — D510 Vitamin B12 deficiency anemia due to intrinsic factor deficiency: Secondary | ICD-10-CM | POA: Diagnosis not present

## 2023-03-25 DIAGNOSIS — J449 Chronic obstructive pulmonary disease, unspecified: Secondary | ICD-10-CM | POA: Diagnosis not present

## 2023-03-25 DIAGNOSIS — K5090 Crohn's disease, unspecified, without complications: Secondary | ICD-10-CM | POA: Diagnosis not present

## 2023-03-25 DIAGNOSIS — K219 Gastro-esophageal reflux disease without esophagitis: Secondary | ICD-10-CM | POA: Diagnosis not present

## 2023-03-25 DIAGNOSIS — E538 Deficiency of other specified B group vitamins: Secondary | ICD-10-CM | POA: Diagnosis not present

## 2023-03-25 DIAGNOSIS — Z7982 Long term (current) use of aspirin: Secondary | ICD-10-CM | POA: Diagnosis not present

## 2023-03-25 DIAGNOSIS — B0229 Other postherpetic nervous system involvement: Secondary | ICD-10-CM | POA: Diagnosis not present

## 2023-03-25 DIAGNOSIS — K861 Other chronic pancreatitis: Secondary | ICD-10-CM | POA: Diagnosis not present

## 2023-03-25 DIAGNOSIS — B952 Enterococcus as the cause of diseases classified elsewhere: Secondary | ICD-10-CM | POA: Diagnosis not present

## 2023-03-25 DIAGNOSIS — D638 Anemia in other chronic diseases classified elsewhere: Secondary | ICD-10-CM | POA: Diagnosis not present

## 2023-03-25 DIAGNOSIS — N390 Urinary tract infection, site not specified: Secondary | ICD-10-CM | POA: Diagnosis not present

## 2023-03-25 DIAGNOSIS — E876 Hypokalemia: Secondary | ICD-10-CM | POA: Diagnosis not present

## 2023-03-25 DIAGNOSIS — G8929 Other chronic pain: Secondary | ICD-10-CM | POA: Diagnosis not present

## 2023-03-25 DIAGNOSIS — Z7902 Long term (current) use of antithrombotics/antiplatelets: Secondary | ICD-10-CM | POA: Diagnosis not present

## 2023-03-25 DIAGNOSIS — I10 Essential (primary) hypertension: Secondary | ICD-10-CM | POA: Diagnosis not present

## 2023-03-25 DIAGNOSIS — Z960 Presence of urogenital implants: Secondary | ICD-10-CM | POA: Diagnosis not present

## 2023-03-25 DIAGNOSIS — I69354 Hemiplegia and hemiparesis following cerebral infarction affecting left non-dominant side: Secondary | ICD-10-CM | POA: Diagnosis not present

## 2023-03-25 DIAGNOSIS — Z79891 Long term (current) use of opiate analgesic: Secondary | ICD-10-CM | POA: Diagnosis not present

## 2023-03-25 DIAGNOSIS — G9341 Metabolic encephalopathy: Secondary | ICD-10-CM | POA: Diagnosis not present

## 2023-03-25 DIAGNOSIS — M25552 Pain in left hip: Secondary | ICD-10-CM | POA: Diagnosis not present

## 2023-03-25 DIAGNOSIS — F17210 Nicotine dependence, cigarettes, uncomplicated: Secondary | ICD-10-CM | POA: Diagnosis not present

## 2023-03-25 DIAGNOSIS — D649 Anemia, unspecified: Secondary | ICD-10-CM | POA: Diagnosis not present

## 2023-03-25 DIAGNOSIS — I700 Atherosclerosis of aorta: Secondary | ICD-10-CM | POA: Diagnosis not present

## 2023-03-26 DIAGNOSIS — D649 Anemia, unspecified: Secondary | ICD-10-CM | POA: Diagnosis not present

## 2023-03-26 DIAGNOSIS — Z556 Problems related to health literacy: Secondary | ICD-10-CM | POA: Diagnosis not present

## 2023-03-26 DIAGNOSIS — I69354 Hemiplegia and hemiparesis following cerebral infarction affecting left non-dominant side: Secondary | ICD-10-CM | POA: Diagnosis not present

## 2023-03-26 DIAGNOSIS — B49 Unspecified mycosis: Secondary | ICD-10-CM | POA: Diagnosis not present

## 2023-03-26 DIAGNOSIS — Z960 Presence of urogenital implants: Secondary | ICD-10-CM | POA: Diagnosis not present

## 2023-03-26 DIAGNOSIS — K5730 Diverticulosis of large intestine without perforation or abscess without bleeding: Secondary | ICD-10-CM | POA: Diagnosis not present

## 2023-03-26 DIAGNOSIS — K219 Gastro-esophageal reflux disease without esophagitis: Secondary | ICD-10-CM | POA: Diagnosis not present

## 2023-03-26 DIAGNOSIS — B952 Enterococcus as the cause of diseases classified elsewhere: Secondary | ICD-10-CM | POA: Diagnosis not present

## 2023-03-26 DIAGNOSIS — F17210 Nicotine dependence, cigarettes, uncomplicated: Secondary | ICD-10-CM | POA: Diagnosis not present

## 2023-03-26 DIAGNOSIS — Z79891 Long term (current) use of opiate analgesic: Secondary | ICD-10-CM | POA: Diagnosis not present

## 2023-03-26 DIAGNOSIS — K5090 Crohn's disease, unspecified, without complications: Secondary | ICD-10-CM | POA: Diagnosis not present

## 2023-03-26 DIAGNOSIS — Z7902 Long term (current) use of antithrombotics/antiplatelets: Secondary | ICD-10-CM | POA: Diagnosis not present

## 2023-03-26 DIAGNOSIS — E538 Deficiency of other specified B group vitamins: Secondary | ICD-10-CM | POA: Diagnosis not present

## 2023-03-26 DIAGNOSIS — N390 Urinary tract infection, site not specified: Secondary | ICD-10-CM | POA: Diagnosis not present

## 2023-03-26 DIAGNOSIS — Z7982 Long term (current) use of aspirin: Secondary | ICD-10-CM | POA: Diagnosis not present

## 2023-03-26 DIAGNOSIS — I700 Atherosclerosis of aorta: Secondary | ICD-10-CM | POA: Diagnosis not present

## 2023-03-26 DIAGNOSIS — G9341 Metabolic encephalopathy: Secondary | ICD-10-CM | POA: Diagnosis not present

## 2023-03-26 DIAGNOSIS — M5116 Intervertebral disc disorders with radiculopathy, lumbar region: Secondary | ICD-10-CM | POA: Diagnosis not present

## 2023-03-26 DIAGNOSIS — K861 Other chronic pancreatitis: Secondary | ICD-10-CM | POA: Diagnosis not present

## 2023-03-26 DIAGNOSIS — I10 Essential (primary) hypertension: Secondary | ICD-10-CM | POA: Diagnosis not present

## 2023-03-28 DIAGNOSIS — N390 Urinary tract infection, site not specified: Secondary | ICD-10-CM | POA: Diagnosis not present

## 2023-03-28 DIAGNOSIS — Z7982 Long term (current) use of aspirin: Secondary | ICD-10-CM | POA: Diagnosis not present

## 2023-03-28 DIAGNOSIS — B952 Enterococcus as the cause of diseases classified elsewhere: Secondary | ICD-10-CM | POA: Diagnosis not present

## 2023-03-28 DIAGNOSIS — K5090 Crohn's disease, unspecified, without complications: Secondary | ICD-10-CM | POA: Diagnosis not present

## 2023-03-28 DIAGNOSIS — Z556 Problems related to health literacy: Secondary | ICD-10-CM | POA: Diagnosis not present

## 2023-03-28 DIAGNOSIS — I10 Essential (primary) hypertension: Secondary | ICD-10-CM | POA: Diagnosis not present

## 2023-03-28 DIAGNOSIS — K219 Gastro-esophageal reflux disease without esophagitis: Secondary | ICD-10-CM | POA: Diagnosis not present

## 2023-03-28 DIAGNOSIS — K5730 Diverticulosis of large intestine without perforation or abscess without bleeding: Secondary | ICD-10-CM | POA: Diagnosis not present

## 2023-03-28 DIAGNOSIS — F17210 Nicotine dependence, cigarettes, uncomplicated: Secondary | ICD-10-CM | POA: Diagnosis not present

## 2023-03-28 DIAGNOSIS — D649 Anemia, unspecified: Secondary | ICD-10-CM | POA: Diagnosis not present

## 2023-03-28 DIAGNOSIS — I700 Atherosclerosis of aorta: Secondary | ICD-10-CM | POA: Diagnosis not present

## 2023-03-28 DIAGNOSIS — I69354 Hemiplegia and hemiparesis following cerebral infarction affecting left non-dominant side: Secondary | ICD-10-CM | POA: Diagnosis not present

## 2023-03-28 DIAGNOSIS — E538 Deficiency of other specified B group vitamins: Secondary | ICD-10-CM | POA: Diagnosis not present

## 2023-03-28 DIAGNOSIS — B49 Unspecified mycosis: Secondary | ICD-10-CM | POA: Diagnosis not present

## 2023-03-28 DIAGNOSIS — Z7902 Long term (current) use of antithrombotics/antiplatelets: Secondary | ICD-10-CM | POA: Diagnosis not present

## 2023-03-28 DIAGNOSIS — G9341 Metabolic encephalopathy: Secondary | ICD-10-CM | POA: Diagnosis not present

## 2023-03-28 DIAGNOSIS — M5116 Intervertebral disc disorders with radiculopathy, lumbar region: Secondary | ICD-10-CM | POA: Diagnosis not present

## 2023-03-28 DIAGNOSIS — Z79891 Long term (current) use of opiate analgesic: Secondary | ICD-10-CM | POA: Diagnosis not present

## 2023-03-28 DIAGNOSIS — Z960 Presence of urogenital implants: Secondary | ICD-10-CM | POA: Diagnosis not present

## 2023-03-28 DIAGNOSIS — K861 Other chronic pancreatitis: Secondary | ICD-10-CM | POA: Diagnosis not present

## 2023-03-30 DIAGNOSIS — Z79891 Long term (current) use of opiate analgesic: Secondary | ICD-10-CM | POA: Diagnosis not present

## 2023-03-30 DIAGNOSIS — K219 Gastro-esophageal reflux disease without esophagitis: Secondary | ICD-10-CM | POA: Diagnosis not present

## 2023-03-30 DIAGNOSIS — N390 Urinary tract infection, site not specified: Secondary | ICD-10-CM | POA: Diagnosis not present

## 2023-03-30 DIAGNOSIS — I69354 Hemiplegia and hemiparesis following cerebral infarction affecting left non-dominant side: Secondary | ICD-10-CM | POA: Diagnosis not present

## 2023-03-30 DIAGNOSIS — B49 Unspecified mycosis: Secondary | ICD-10-CM | POA: Diagnosis not present

## 2023-03-30 DIAGNOSIS — E538 Deficiency of other specified B group vitamins: Secondary | ICD-10-CM | POA: Diagnosis not present

## 2023-03-30 DIAGNOSIS — I700 Atherosclerosis of aorta: Secondary | ICD-10-CM | POA: Diagnosis not present

## 2023-03-30 DIAGNOSIS — G9341 Metabolic encephalopathy: Secondary | ICD-10-CM | POA: Diagnosis not present

## 2023-03-30 DIAGNOSIS — Z7902 Long term (current) use of antithrombotics/antiplatelets: Secondary | ICD-10-CM | POA: Diagnosis not present

## 2023-03-30 DIAGNOSIS — K5090 Crohn's disease, unspecified, without complications: Secondary | ICD-10-CM | POA: Diagnosis not present

## 2023-03-30 DIAGNOSIS — I10 Essential (primary) hypertension: Secondary | ICD-10-CM | POA: Diagnosis not present

## 2023-03-30 DIAGNOSIS — M5116 Intervertebral disc disorders with radiculopathy, lumbar region: Secondary | ICD-10-CM | POA: Diagnosis not present

## 2023-03-30 DIAGNOSIS — D649 Anemia, unspecified: Secondary | ICD-10-CM | POA: Diagnosis not present

## 2023-03-30 DIAGNOSIS — B952 Enterococcus as the cause of diseases classified elsewhere: Secondary | ICD-10-CM | POA: Diagnosis not present

## 2023-03-30 DIAGNOSIS — Z556 Problems related to health literacy: Secondary | ICD-10-CM | POA: Diagnosis not present

## 2023-03-30 DIAGNOSIS — Z960 Presence of urogenital implants: Secondary | ICD-10-CM | POA: Diagnosis not present

## 2023-03-30 DIAGNOSIS — Z7982 Long term (current) use of aspirin: Secondary | ICD-10-CM | POA: Diagnosis not present

## 2023-03-30 DIAGNOSIS — K861 Other chronic pancreatitis: Secondary | ICD-10-CM | POA: Diagnosis not present

## 2023-03-30 DIAGNOSIS — K5730 Diverticulosis of large intestine without perforation or abscess without bleeding: Secondary | ICD-10-CM | POA: Diagnosis not present

## 2023-03-30 DIAGNOSIS — F17210 Nicotine dependence, cigarettes, uncomplicated: Secondary | ICD-10-CM | POA: Diagnosis not present

## 2023-03-31 ENCOUNTER — Ambulatory Visit: Payer: Medicare Other | Admitting: Vascular Surgery

## 2023-03-31 ENCOUNTER — Encounter: Payer: Self-pay | Admitting: *Deleted

## 2023-03-31 ENCOUNTER — Ambulatory Visit: Payer: Self-pay | Admitting: *Deleted

## 2023-03-31 DIAGNOSIS — Z7902 Long term (current) use of antithrombotics/antiplatelets: Secondary | ICD-10-CM | POA: Diagnosis not present

## 2023-03-31 DIAGNOSIS — N390 Urinary tract infection, site not specified: Secondary | ICD-10-CM | POA: Diagnosis not present

## 2023-03-31 DIAGNOSIS — G9341 Metabolic encephalopathy: Secondary | ICD-10-CM | POA: Diagnosis not present

## 2023-03-31 DIAGNOSIS — D649 Anemia, unspecified: Secondary | ICD-10-CM | POA: Diagnosis not present

## 2023-03-31 DIAGNOSIS — B952 Enterococcus as the cause of diseases classified elsewhere: Secondary | ICD-10-CM | POA: Diagnosis not present

## 2023-03-31 DIAGNOSIS — K861 Other chronic pancreatitis: Secondary | ICD-10-CM | POA: Diagnosis not present

## 2023-03-31 DIAGNOSIS — M5116 Intervertebral disc disorders with radiculopathy, lumbar region: Secondary | ICD-10-CM | POA: Diagnosis not present

## 2023-03-31 DIAGNOSIS — I69354 Hemiplegia and hemiparesis following cerebral infarction affecting left non-dominant side: Secondary | ICD-10-CM | POA: Diagnosis not present

## 2023-03-31 DIAGNOSIS — I6523 Occlusion and stenosis of bilateral carotid arteries: Secondary | ICD-10-CM | POA: Diagnosis not present

## 2023-03-31 DIAGNOSIS — Z7982 Long term (current) use of aspirin: Secondary | ICD-10-CM | POA: Diagnosis not present

## 2023-03-31 DIAGNOSIS — Z556 Problems related to health literacy: Secondary | ICD-10-CM | POA: Diagnosis not present

## 2023-03-31 DIAGNOSIS — K219 Gastro-esophageal reflux disease without esophagitis: Secondary | ICD-10-CM | POA: Diagnosis not present

## 2023-03-31 DIAGNOSIS — B49 Unspecified mycosis: Secondary | ICD-10-CM | POA: Diagnosis not present

## 2023-03-31 DIAGNOSIS — I779 Disorder of arteries and arterioles, unspecified: Secondary | ICD-10-CM | POA: Insufficient documentation

## 2023-03-31 DIAGNOSIS — I700 Atherosclerosis of aorta: Secondary | ICD-10-CM | POA: Diagnosis not present

## 2023-03-31 DIAGNOSIS — Z79891 Long term (current) use of opiate analgesic: Secondary | ICD-10-CM | POA: Diagnosis not present

## 2023-03-31 DIAGNOSIS — K5090 Crohn's disease, unspecified, without complications: Secondary | ICD-10-CM | POA: Diagnosis not present

## 2023-03-31 DIAGNOSIS — E538 Deficiency of other specified B group vitamins: Secondary | ICD-10-CM | POA: Diagnosis not present

## 2023-03-31 DIAGNOSIS — K5730 Diverticulosis of large intestine without perforation or abscess without bleeding: Secondary | ICD-10-CM | POA: Diagnosis not present

## 2023-03-31 DIAGNOSIS — F17210 Nicotine dependence, cigarettes, uncomplicated: Secondary | ICD-10-CM | POA: Diagnosis not present

## 2023-03-31 DIAGNOSIS — Z960 Presence of urogenital implants: Secondary | ICD-10-CM | POA: Diagnosis not present

## 2023-03-31 DIAGNOSIS — I10 Essential (primary) hypertension: Secondary | ICD-10-CM | POA: Diagnosis not present

## 2023-03-31 NOTE — Progress Notes (Signed)
Virtual Visit via Telephone Note   I connected with [name] on 03/31/2023 using the [employer] by telephone and verified that I was speaking with the correct person using two identifiers. Patient was located at home accompanied by [name] (daughter-in-law). I am located at [facility] office.   The limitations of evaluation and management by telemedicine and the availability of in person appointments have been previously discussed with the patient and are documented in the patients chart. The patient expressed understanding and consented to proceed.  PCP: Assunta Found, MD Referring MD: Dr. Edilia Bo  Chief Complaint: Follow-up for carotid artery disease, symptomatic right carotid stenosis  History of Present Illness: [name] is a 77 y.o. female that presents for 4 to 6-week follow-up for symptomatic right ICA stenosis.  She was previously seen by Dr. Edilia Bo on 02/10/2023 in consultation with symptomatic right carotid stenosis and profound left hemiparesis.  At the time neurology did not feel she was a candidate for revascularization until she showed clinical improvement before considering right carotid endarterectomy.  Today her family states she is bedbound but at home in a hospital bed.  They feel she has some slight movement on the left side in arm and leg.  No new neurologic events.  Past Medical History:  Diagnosis Date   Anemia, unspecified    Arthritis    Crohn's disease (HCC)    Degenerative disc disease, lumbar    Hypertension    Sciatica of right side    Stroke [facility])    Ventral hernia     Past Surgical History:  Procedure Laterality Date   CAROTID ENDARTERECTOMY     2004   CHOLECYSTECTOMY     2008   ESOPHAGOGASTRODUODENOSCOPY N/A 02/24/2015   Procedure: ESOPHAGOGASTRODUODENOSCOPY (EGD);  Surgeon: Corbin Ade, MD;  Location: [facility];  Service: Endoscopy;  Laterality: N/A;   HEMICOLECTOMY     right   HIP ARTHROPLASTY Left 04/09/2015   Procedure:  INSERT PARTIAL HIP REPLACEMENT LEFT;  Surgeon: Vickki Hearing, MD;  Location: [facility] ORS;  Service: Orthopedics;  Laterality: Left;   INCISIONAL HERNIA REPAIR N/A 05/20/2013   Procedure: Sherald Hess HERNIORRHAPHY WITH MESH;  Surgeon: Dalia Heading, MD;  Location: [facility];  Service: General;  Laterality: N/A;   INCISIONAL HERNIA REPAIR N/A 05/19/2014   Procedure: RECURRENT HERNIA REPAIR INCISIONAL;  Surgeon: Dalia Heading, MD;  Location: [facility];  Service: General;  Laterality: N/A;   INSERTION OF MESH N/A 05/20/2013   Procedure: INSERTION OF MESH;  Surgeon: Dalia Heading, MD;  Location: [facility];  Service: General;  Laterality: N/A;   INSERTION OF MESH N/A 05/19/2014   Procedure: INSERTION OF MESH;  Surgeon: Dalia Heading, MD;  Location: [facility];  Service: General;  Laterality: N/A;   TUBAL LIGATION      No outpatient medications have been marked as taking for the 03/31/23 encounter (Appointment) with Cephus Shelling, MD.    12 system ROS was negative unless otherwise noted in HPI   Observations/Objective:   Assessment and Plan:  77 y.o. female with the presents for 4 to 6-week follow-up for symptomatic right ICA stenosis.  She was previously seen by Dr. Edilia Bo on 02/10/2023 with symptomatic right carotid stenosis and profound left hemiparesis.  At the time neurology did not feel she was a candidate for revascularization until she showed clinical improvement before considering right carotid endarterectomy.  Discussed that she has not made any significant clinical improvement if she is  still bedbound to warrant right carotid revascularization for symptomatic high-grade stenosis.  The family think she has made some slight improvements and can slightly move the left side.  I will see her in 3 months and discussed I would like to see her in person to see what her clinical exam is and also get a carotid duplex at that time.  Discussed that it is hard to justify carotid revascularization with such  profound neurologic deficits and she needs to show continued improvement.  Follow Up Instructions:   Follow up: 3 months with carotid duplex   I discussed the assessment and treatment plan with the patient. The patient was provided an opportunity to ask questions and all were answered. The patient agreed with the plan and demonstrated an understanding of the instructions.   The patient was advised to call back or seek an in-person evaluation if the symptoms worsen or if the condition fails to improve as anticipated.  I spent 15 minutes with the patient via telephone encounter.   Signed, Cephus Shelling [facility] Office: [phone]  03/31/2023, 4:43 PM

## 2023-04-02 DIAGNOSIS — Z7982 Long term (current) use of aspirin: Secondary | ICD-10-CM | POA: Diagnosis not present

## 2023-04-02 DIAGNOSIS — K219 Gastro-esophageal reflux disease without esophagitis: Secondary | ICD-10-CM | POA: Diagnosis not present

## 2023-04-02 DIAGNOSIS — Z960 Presence of urogenital implants: Secondary | ICD-10-CM | POA: Diagnosis not present

## 2023-04-02 DIAGNOSIS — I10 Essential (primary) hypertension: Secondary | ICD-10-CM | POA: Diagnosis not present

## 2023-04-02 DIAGNOSIS — Z556 Problems related to health literacy: Secondary | ICD-10-CM | POA: Diagnosis not present

## 2023-04-02 DIAGNOSIS — F17210 Nicotine dependence, cigarettes, uncomplicated: Secondary | ICD-10-CM | POA: Diagnosis not present

## 2023-04-02 DIAGNOSIS — I69354 Hemiplegia and hemiparesis following cerebral infarction affecting left non-dominant side: Secondary | ICD-10-CM | POA: Diagnosis not present

## 2023-04-02 DIAGNOSIS — Z79891 Long term (current) use of opiate analgesic: Secondary | ICD-10-CM | POA: Diagnosis not present

## 2023-04-02 DIAGNOSIS — Z7902 Long term (current) use of antithrombotics/antiplatelets: Secondary | ICD-10-CM | POA: Diagnosis not present

## 2023-04-02 DIAGNOSIS — G9341 Metabolic encephalopathy: Secondary | ICD-10-CM | POA: Diagnosis not present

## 2023-04-02 DIAGNOSIS — K861 Other chronic pancreatitis: Secondary | ICD-10-CM | POA: Diagnosis not present

## 2023-04-02 DIAGNOSIS — D649 Anemia, unspecified: Secondary | ICD-10-CM | POA: Diagnosis not present

## 2023-04-02 DIAGNOSIS — B952 Enterococcus as the cause of diseases classified elsewhere: Secondary | ICD-10-CM | POA: Diagnosis not present

## 2023-04-02 DIAGNOSIS — I700 Atherosclerosis of aorta: Secondary | ICD-10-CM | POA: Diagnosis not present

## 2023-04-02 DIAGNOSIS — K5730 Diverticulosis of large intestine without perforation or abscess without bleeding: Secondary | ICD-10-CM | POA: Diagnosis not present

## 2023-04-02 DIAGNOSIS — B49 Unspecified mycosis: Secondary | ICD-10-CM | POA: Diagnosis not present

## 2023-04-02 DIAGNOSIS — M5116 Intervertebral disc disorders with radiculopathy, lumbar region: Secondary | ICD-10-CM | POA: Diagnosis not present

## 2023-04-02 DIAGNOSIS — E538 Deficiency of other specified B group vitamins: Secondary | ICD-10-CM | POA: Diagnosis not present

## 2023-04-02 DIAGNOSIS — N390 Urinary tract infection, site not specified: Secondary | ICD-10-CM | POA: Diagnosis not present

## 2023-04-02 DIAGNOSIS — K5090 Crohn's disease, unspecified, without complications: Secondary | ICD-10-CM | POA: Diagnosis not present

## 2023-04-03 DIAGNOSIS — F17210 Nicotine dependence, cigarettes, uncomplicated: Secondary | ICD-10-CM | POA: Diagnosis not present

## 2023-04-03 DIAGNOSIS — Z960 Presence of urogenital implants: Secondary | ICD-10-CM | POA: Diagnosis not present

## 2023-04-03 DIAGNOSIS — D649 Anemia, unspecified: Secondary | ICD-10-CM | POA: Diagnosis not present

## 2023-04-03 DIAGNOSIS — K219 Gastro-esophageal reflux disease without esophagitis: Secondary | ICD-10-CM | POA: Diagnosis not present

## 2023-04-03 DIAGNOSIS — N390 Urinary tract infection, site not specified: Secondary | ICD-10-CM | POA: Diagnosis not present

## 2023-04-03 DIAGNOSIS — B49 Unspecified mycosis: Secondary | ICD-10-CM | POA: Diagnosis not present

## 2023-04-03 DIAGNOSIS — K5090 Crohn's disease, unspecified, without complications: Secondary | ICD-10-CM | POA: Diagnosis not present

## 2023-04-03 DIAGNOSIS — B952 Enterococcus as the cause of diseases classified elsewhere: Secondary | ICD-10-CM | POA: Diagnosis not present

## 2023-04-03 DIAGNOSIS — Z556 Problems related to health literacy: Secondary | ICD-10-CM | POA: Diagnosis not present

## 2023-04-03 DIAGNOSIS — Z7982 Long term (current) use of aspirin: Secondary | ICD-10-CM | POA: Diagnosis not present

## 2023-04-03 DIAGNOSIS — Z79891 Long term (current) use of opiate analgesic: Secondary | ICD-10-CM | POA: Diagnosis not present

## 2023-04-03 DIAGNOSIS — E538 Deficiency of other specified B group vitamins: Secondary | ICD-10-CM | POA: Diagnosis not present

## 2023-04-03 DIAGNOSIS — I69354 Hemiplegia and hemiparesis following cerebral infarction affecting left non-dominant side: Secondary | ICD-10-CM | POA: Diagnosis not present

## 2023-04-03 DIAGNOSIS — G9341 Metabolic encephalopathy: Secondary | ICD-10-CM | POA: Diagnosis not present

## 2023-04-03 DIAGNOSIS — K861 Other chronic pancreatitis: Secondary | ICD-10-CM | POA: Diagnosis not present

## 2023-04-03 DIAGNOSIS — M5116 Intervertebral disc disorders with radiculopathy, lumbar region: Secondary | ICD-10-CM | POA: Diagnosis not present

## 2023-04-03 DIAGNOSIS — I700 Atherosclerosis of aorta: Secondary | ICD-10-CM | POA: Diagnosis not present

## 2023-04-03 DIAGNOSIS — Z7902 Long term (current) use of antithrombotics/antiplatelets: Secondary | ICD-10-CM | POA: Diagnosis not present

## 2023-04-03 DIAGNOSIS — K5730 Diverticulosis of large intestine without perforation or abscess without bleeding: Secondary | ICD-10-CM | POA: Diagnosis not present

## 2023-04-03 DIAGNOSIS — I10 Essential (primary) hypertension: Secondary | ICD-10-CM | POA: Diagnosis not present

## 2023-04-08 NOTE — Patient Outreach (Signed)
Care Coordination   Initial Visit Note   03/31/2023 Name: [name] MRN: [id number] DOB: 12-24-1945  [name] is a 77 y.o. year old female who sees Assunta Found, MD for primary care. I  spoke with daughter, [name], by telephone today.   What matters to the patients health and wellness today?  Managing everything at home since she wasn't able to be discharged back to SNF.    Goals Addressed             This Visit's Progress    Care Coorindation Services       Care Coordination Goals: Patient will work with [facility] Keep all medical appointments Dr Pearlean Brownie (neurology) on 05/13/23 for hospital F/U Reschedule gastroenterology F/U. No show on 03/24/23. Follow-up with PCP regarding elevated TSH level while hospitalized Follow-up with PCP regarding prescription for Plavix This was to be continued but she did not have a current prescription after hospital discharge Reach out to RN Care Management Coordinator at [phone] with any care coordination or resource needs        SDOH assessments and interventions completed:  Yes  SDOH Interventions Today    Flowsheet Row Most Recent Value  SDOH Interventions   Food Insecurity Interventions Intervention Not Indicated  Housing Interventions Intervention Not Indicated  Transportation Interventions Patient Resources (Friends/Family)  Utilities Interventions Intervention Not Indicated  Financial Strain Interventions Intervention Not Indicated  Physical Activity Interventions Other (Comments)  [working with HHPT]  Social Connections Interventions Intervention Not Indicated  Health Literacy Interventions Intervention Not Indicated, Other (Comment)  [family assists]        Care Coordination Interventions:  Yes, provided  Interventions Today    Flowsheet Row Most Recent Value  Chronic Disease   Chronic disease during today's visit Hypertension (HTN), Other  [Latent Effects of CVA]  General Interventions    General Interventions Discussed/Reviewed General Interventions Discussed, General Interventions Reviewed, Labs, Doctor Visits, Durable Medical Equipment (DME), [employer], Communication with  Doctor Visits Discussed/Reviewed Doctor Visits Discussed, Doctor Visits Reviewed, PCP, Specialist  [Followed up with PCP by video visit after hospital discharge. Reviewed and discussed recent hospitalization and dischrage on 03/17/23]  Durable Medical Equipment (DME) Wheelchair, BP Cuff  [Adult diapers, hospital bed]  Wheelchair Standard  PCP/Specialist Visits Compliance with follow-up visit  [needs to reschedule gastroenterology visit. Daughter has number and will call to reschedule. Virtual visit with vascular today. Follow up with Dr Pearlean Brownie (neurology) in person on 05/13/23]  Communication with PCP/Specialists  [Secure Chat with Judie Grieve, LPN with Dr Ophelia Charter office regarding Plavix prescription. They are unable to fill Plavix. Telephone outreach to Dr Lamar Blinks office Re: plavix refill & elevated TSH. Awaiting response.]  Exercise Interventions   Exercise Discussed/Reviewed Exercise Discussed, Exercise Reviewed, Physical Activity, Assistive device use and maintanence  Physical Activity Discussed/Reviewed Physical Activity Discussed, Physical Activity Reviewed  [not ambulatory. Working with HHPT once a week. Has a wheelchair but mostly stays in hospital bed. Has ADLs done for her. Uses adult diapers and has a urinary catheter. Does not get up to have a bowel movement.]  Education Interventions   Education Provided Provided Education, Provided Printed Education  [Printed materials on Advance Directives and bed sore prevention]  Provided Verbal Education On Nutrition, When to see the doctor, Medication, Exercise, Other, [employer], Labs  [Blood pressure monitoring, Bed sore prevention]  Labs Reviewed --  [03/14/23 ammonia level 21, TSH 8.810 (untreated)]  Nutrition Interventions    Nutrition Discussed/Reviewed Nutrition Discussed, Nutrition Reviewed,  Increasing proteins, Fluid intake, Supplemental nutrition  [Patient has to eat soft foods because she doesn't have any natural teeth or dentures]  Pharmacy Interventions   Pharmacy Dicussed/Reviewed Pharmacy Topics Discussed, Pharmacy Topics Reviewed, Medications and their functions  [Has not taken plavix since discharge on 03/17/23. Was supposed to continue due to history of CVA. Outreach to PCP has been made regarding need for refill.]  Safety Interventions   Safety Discussed/Reviewed Safety Discussed, Safety Reviewed, Fall Risk, Home Safety  Home Safety Assistive Devices  Advanced Directive Interventions   Advanced Directives Discussed/Reviewed Advanced Directives Discussed, Advanced Directives Reviewed, Advanced Care Planning  [Information mailed on living will and power of attorney]       Follow up plan: Follow up call scheduled for 04/10/23    Encounter Outcome:  Patient Visit Completed   Demetrios Loll, RN, BSN Care Management Coordinator [facility]  [facility] Direct Dial: [phone] Main #: [phone]

## 2023-04-09 DIAGNOSIS — E538 Deficiency of other specified B group vitamins: Secondary | ICD-10-CM | POA: Diagnosis not present

## 2023-04-09 DIAGNOSIS — B952 Enterococcus as the cause of diseases classified elsewhere: Secondary | ICD-10-CM | POA: Diagnosis not present

## 2023-04-09 DIAGNOSIS — I700 Atherosclerosis of aorta: Secondary | ICD-10-CM | POA: Diagnosis not present

## 2023-04-09 DIAGNOSIS — I10 Essential (primary) hypertension: Secondary | ICD-10-CM | POA: Diagnosis not present

## 2023-04-09 DIAGNOSIS — K219 Gastro-esophageal reflux disease without esophagitis: Secondary | ICD-10-CM | POA: Diagnosis not present

## 2023-04-09 DIAGNOSIS — K5090 Crohn's disease, unspecified, without complications: Secondary | ICD-10-CM | POA: Diagnosis not present

## 2023-04-09 DIAGNOSIS — Z7902 Long term (current) use of antithrombotics/antiplatelets: Secondary | ICD-10-CM | POA: Diagnosis not present

## 2023-04-09 DIAGNOSIS — G9341 Metabolic encephalopathy: Secondary | ICD-10-CM | POA: Diagnosis not present

## 2023-04-09 DIAGNOSIS — Z556 Problems related to health literacy: Secondary | ICD-10-CM | POA: Diagnosis not present

## 2023-04-09 DIAGNOSIS — K861 Other chronic pancreatitis: Secondary | ICD-10-CM | POA: Diagnosis not present

## 2023-04-09 DIAGNOSIS — N390 Urinary tract infection, site not specified: Secondary | ICD-10-CM | POA: Diagnosis not present

## 2023-04-09 DIAGNOSIS — D649 Anemia, unspecified: Secondary | ICD-10-CM | POA: Diagnosis not present

## 2023-04-09 DIAGNOSIS — K5730 Diverticulosis of large intestine without perforation or abscess without bleeding: Secondary | ICD-10-CM | POA: Diagnosis not present

## 2023-04-09 DIAGNOSIS — Z79891 Long term (current) use of opiate analgesic: Secondary | ICD-10-CM | POA: Diagnosis not present

## 2023-04-09 DIAGNOSIS — B49 Unspecified mycosis: Secondary | ICD-10-CM | POA: Diagnosis not present

## 2023-04-09 DIAGNOSIS — Z960 Presence of urogenital implants: Secondary | ICD-10-CM | POA: Diagnosis not present

## 2023-04-09 DIAGNOSIS — M5116 Intervertebral disc disorders with radiculopathy, lumbar region: Secondary | ICD-10-CM | POA: Diagnosis not present

## 2023-04-09 DIAGNOSIS — I69354 Hemiplegia and hemiparesis following cerebral infarction affecting left non-dominant side: Secondary | ICD-10-CM | POA: Diagnosis not present

## 2023-04-09 DIAGNOSIS — F17210 Nicotine dependence, cigarettes, uncomplicated: Secondary | ICD-10-CM | POA: Diagnosis not present

## 2023-04-09 DIAGNOSIS — Z7982 Long term (current) use of aspirin: Secondary | ICD-10-CM | POA: Diagnosis not present

## 2023-04-10 ENCOUNTER — Encounter: Payer: Self-pay | Admitting: *Deleted

## 2023-04-10 ENCOUNTER — Ambulatory Visit: Payer: Self-pay | Admitting: *Deleted

## 2023-04-10 DIAGNOSIS — D649 Anemia, unspecified: Secondary | ICD-10-CM | POA: Diagnosis not present

## 2023-04-10 DIAGNOSIS — I10 Essential (primary) hypertension: Secondary | ICD-10-CM | POA: Diagnosis not present

## 2023-04-10 DIAGNOSIS — Z556 Problems related to health literacy: Secondary | ICD-10-CM | POA: Diagnosis not present

## 2023-04-10 DIAGNOSIS — Z7902 Long term (current) use of antithrombotics/antiplatelets: Secondary | ICD-10-CM | POA: Diagnosis not present

## 2023-04-10 DIAGNOSIS — G9341 Metabolic encephalopathy: Secondary | ICD-10-CM | POA: Diagnosis not present

## 2023-04-10 DIAGNOSIS — K5090 Crohn's disease, unspecified, without complications: Secondary | ICD-10-CM | POA: Diagnosis not present

## 2023-04-10 DIAGNOSIS — Z7982 Long term (current) use of aspirin: Secondary | ICD-10-CM | POA: Diagnosis not present

## 2023-04-10 DIAGNOSIS — B49 Unspecified mycosis: Secondary | ICD-10-CM | POA: Diagnosis not present

## 2023-04-10 DIAGNOSIS — K5730 Diverticulosis of large intestine without perforation or abscess without bleeding: Secondary | ICD-10-CM | POA: Diagnosis not present

## 2023-04-10 DIAGNOSIS — K219 Gastro-esophageal reflux disease without esophagitis: Secondary | ICD-10-CM | POA: Diagnosis not present

## 2023-04-10 DIAGNOSIS — I700 Atherosclerosis of aorta: Secondary | ICD-10-CM | POA: Diagnosis not present

## 2023-04-10 DIAGNOSIS — M5116 Intervertebral disc disorders with radiculopathy, lumbar region: Secondary | ICD-10-CM | POA: Diagnosis not present

## 2023-04-10 DIAGNOSIS — B952 Enterococcus as the cause of diseases classified elsewhere: Secondary | ICD-10-CM | POA: Diagnosis not present

## 2023-04-10 DIAGNOSIS — F17210 Nicotine dependence, cigarettes, uncomplicated: Secondary | ICD-10-CM | POA: Diagnosis not present

## 2023-04-10 DIAGNOSIS — K861 Other chronic pancreatitis: Secondary | ICD-10-CM | POA: Diagnosis not present

## 2023-04-10 DIAGNOSIS — Z79891 Long term (current) use of opiate analgesic: Secondary | ICD-10-CM | POA: Diagnosis not present

## 2023-04-10 DIAGNOSIS — I69354 Hemiplegia and hemiparesis following cerebral infarction affecting left non-dominant side: Secondary | ICD-10-CM | POA: Diagnosis not present

## 2023-04-10 DIAGNOSIS — E538 Deficiency of other specified B group vitamins: Secondary | ICD-10-CM | POA: Diagnosis not present

## 2023-04-10 DIAGNOSIS — N390 Urinary tract infection, site not specified: Secondary | ICD-10-CM | POA: Diagnosis not present

## 2023-04-10 DIAGNOSIS — Z960 Presence of urogenital implants: Secondary | ICD-10-CM | POA: Diagnosis not present

## 2023-04-10 NOTE — Patient Outreach (Signed)
Care Coordination   Follow Up Visit Note   04/10/2023 Name: [name] MRN: [id number] DOB: 1946/04/11  [name] is a 77 y.o. year old female who sees Annette Found, MD for primary care. I  spoke with daughter, [name], by telephone today.  What matters to the patients health and wellness today?  No needs or concerns endorsed today    Goals Addressed             This Visit's Progress    Care Coorindation Services       Care Coordination Goals: Patient will continue to work with [facility] Keep all medical appointments Dr Pearlean Brownie (neurology) on 05/13/23 for hospital F/U Reschedule gastroenterology F/U. No show on 03/24/23. Follow-up with PCP regarding elevated TSH level while hospitalized  Patient does not have any acute or urgent needs related to this goal and will follow-up with PCP regarding management.         SDOH assessments and interventions completed:  Yes  SDOH Interventions Today    Flowsheet Row Most Recent Value  SDOH Interventions   Housing Interventions Intervention Not Indicated  Transportation Interventions Patient Resources (Friends/Family)        Care Coordination Interventions:  Yes, provided  Interventions Today    Flowsheet Row Most Recent Value  Chronic Disease   Chronic disease during today's visit Hypertension (HTN), Other  [Latent effects of CVA]  General Interventions   General Interventions Discussed/Reviewed General Interventions Discussed, General Interventions Reviewed, Labs, Durable Medical Equipment (DME), Doctor Visits  Labs --  [TSH]  Doctor Visits Discussed/Reviewed Doctor Visits Discussed, Doctor Visits Reviewed, Annual Wellness Visits, PCP, Specialist  Durable Medical Equipment (DME) Wheelchair, BP Cuff, Other  [adult diapers, hosptial bed]  Wheelchair Standard  PCP/Specialist Visits Compliance with follow-up visit  [needs to reschedule gastroenterology visit. Daughter has number and will call to reschedule.  Follow up with Dr Pearlean Brownie (neurology) in person on 05/13/23. Follow-up with PCP regarding any Care Management needs and elevated TSH]  Exercise Interventions   Exercise Discussed/Reviewed Exercise Discussed, Exercise Reviewed, Physical Activity  Physical Activity Discussed/Reviewed Physical Activity Discussed, Physical Activity Reviewed  [non-ambulatory. Working with HHPT once a week. Has a wheelchair but mostly stays in hospital bed. Has ADLs done for her. Uses adult diapers and has a urinary catheter. Does not get up to have a bowel movement.]  Education Interventions   Provided Verbal Education On When to see the doctor, Labs, Medication, Exercise  [blood pressure monitoring]  Labs Reviewed --  [03/14/23 TSH 8.810]  Pharmacy Interventions   Pharmacy Dicussed/Reviewed Pharmacy Topics Discussed, Pharmacy Topics Reviewed, Medications and their functions  [She was able to get plavix refilled and has restarted that. Prior to refill she had been out the two weeks since hospital dishcarge]  Safety Interventions   Safety Discussed/Reviewed Safety Discussed, Safety Reviewed, Fall Risk, Home Safety  Home Safety Assistive Devices  Advanced Directive Interventions   Advanced Directives Discussed/Reviewed Advanced Directives Discussed, Advanced Care Planning  [facility] Advanced Directes Packet on 03/31/23]       Follow up plan: No further intervention required. Patient's Primary Care office is not partnering with the [facility] for care management and will be providing Care Management Services themselves. This final Care Management note will be securely faxed to the PCP office for handoff. Patient has been encouraged to reach out to their PCP office with any resource or care management needs.   Encounter Outcome:  Patient Visit Completed   Demetrios Loll, RN, BSN Care  Management Coordinator [facility]  [facility] Direct Dial: [phone] Main #: [phone]

## 2023-04-13 DIAGNOSIS — B952 Enterococcus as the cause of diseases classified elsewhere: Secondary | ICD-10-CM | POA: Diagnosis not present

## 2023-04-13 DIAGNOSIS — F17210 Nicotine dependence, cigarettes, uncomplicated: Secondary | ICD-10-CM | POA: Diagnosis not present

## 2023-04-13 DIAGNOSIS — B49 Unspecified mycosis: Secondary | ICD-10-CM | POA: Diagnosis not present

## 2023-04-13 DIAGNOSIS — Z556 Problems related to health literacy: Secondary | ICD-10-CM | POA: Diagnosis not present

## 2023-04-13 DIAGNOSIS — Z7982 Long term (current) use of aspirin: Secondary | ICD-10-CM | POA: Diagnosis not present

## 2023-04-13 DIAGNOSIS — D649 Anemia, unspecified: Secondary | ICD-10-CM | POA: Diagnosis not present

## 2023-04-13 DIAGNOSIS — I10 Essential (primary) hypertension: Secondary | ICD-10-CM | POA: Diagnosis not present

## 2023-04-13 DIAGNOSIS — G9341 Metabolic encephalopathy: Secondary | ICD-10-CM | POA: Diagnosis not present

## 2023-04-13 DIAGNOSIS — K5730 Diverticulosis of large intestine without perforation or abscess without bleeding: Secondary | ICD-10-CM | POA: Diagnosis not present

## 2023-04-13 DIAGNOSIS — Z960 Presence of urogenital implants: Secondary | ICD-10-CM | POA: Diagnosis not present

## 2023-04-13 DIAGNOSIS — N390 Urinary tract infection, site not specified: Secondary | ICD-10-CM | POA: Diagnosis not present

## 2023-04-13 DIAGNOSIS — K861 Other chronic pancreatitis: Secondary | ICD-10-CM | POA: Diagnosis not present

## 2023-04-13 DIAGNOSIS — I700 Atherosclerosis of aorta: Secondary | ICD-10-CM | POA: Diagnosis not present

## 2023-04-13 DIAGNOSIS — K5090 Crohn's disease, unspecified, without complications: Secondary | ICD-10-CM | POA: Diagnosis not present

## 2023-04-13 DIAGNOSIS — M5116 Intervertebral disc disorders with radiculopathy, lumbar region: Secondary | ICD-10-CM | POA: Diagnosis not present

## 2023-04-13 DIAGNOSIS — Z79891 Long term (current) use of opiate analgesic: Secondary | ICD-10-CM | POA: Diagnosis not present

## 2023-04-13 DIAGNOSIS — K219 Gastro-esophageal reflux disease without esophagitis: Secondary | ICD-10-CM | POA: Diagnosis not present

## 2023-04-13 DIAGNOSIS — I69354 Hemiplegia and hemiparesis following cerebral infarction affecting left non-dominant side: Secondary | ICD-10-CM | POA: Diagnosis not present

## 2023-04-13 DIAGNOSIS — Z7902 Long term (current) use of antithrombotics/antiplatelets: Secondary | ICD-10-CM | POA: Diagnosis not present

## 2023-04-13 DIAGNOSIS — E538 Deficiency of other specified B group vitamins: Secondary | ICD-10-CM | POA: Diagnosis not present

## 2023-04-14 DIAGNOSIS — Z79891 Long term (current) use of opiate analgesic: Secondary | ICD-10-CM | POA: Diagnosis not present

## 2023-04-14 DIAGNOSIS — B49 Unspecified mycosis: Secondary | ICD-10-CM | POA: Diagnosis not present

## 2023-04-14 DIAGNOSIS — Z7902 Long term (current) use of antithrombotics/antiplatelets: Secondary | ICD-10-CM | POA: Diagnosis not present

## 2023-04-14 DIAGNOSIS — K5090 Crohn's disease, unspecified, without complications: Secondary | ICD-10-CM | POA: Diagnosis not present

## 2023-04-14 DIAGNOSIS — Z960 Presence of urogenital implants: Secondary | ICD-10-CM | POA: Diagnosis not present

## 2023-04-14 DIAGNOSIS — B952 Enterococcus as the cause of diseases classified elsewhere: Secondary | ICD-10-CM | POA: Diagnosis not present

## 2023-04-14 DIAGNOSIS — Z7982 Long term (current) use of aspirin: Secondary | ICD-10-CM | POA: Diagnosis not present

## 2023-04-14 DIAGNOSIS — E538 Deficiency of other specified B group vitamins: Secondary | ICD-10-CM | POA: Diagnosis not present

## 2023-04-14 DIAGNOSIS — N390 Urinary tract infection, site not specified: Secondary | ICD-10-CM | POA: Diagnosis not present

## 2023-04-14 DIAGNOSIS — F17210 Nicotine dependence, cigarettes, uncomplicated: Secondary | ICD-10-CM | POA: Diagnosis not present

## 2023-04-14 DIAGNOSIS — K219 Gastro-esophageal reflux disease without esophagitis: Secondary | ICD-10-CM | POA: Diagnosis not present

## 2023-04-14 DIAGNOSIS — I69354 Hemiplegia and hemiparesis following cerebral infarction affecting left non-dominant side: Secondary | ICD-10-CM | POA: Diagnosis not present

## 2023-04-14 DIAGNOSIS — I10 Essential (primary) hypertension: Secondary | ICD-10-CM | POA: Diagnosis not present

## 2023-04-14 DIAGNOSIS — D649 Anemia, unspecified: Secondary | ICD-10-CM | POA: Diagnosis not present

## 2023-04-14 DIAGNOSIS — I700 Atherosclerosis of aorta: Secondary | ICD-10-CM | POA: Diagnosis not present

## 2023-04-14 DIAGNOSIS — Z556 Problems related to health literacy: Secondary | ICD-10-CM | POA: Diagnosis not present

## 2023-04-14 DIAGNOSIS — K5730 Diverticulosis of large intestine without perforation or abscess without bleeding: Secondary | ICD-10-CM | POA: Diagnosis not present

## 2023-04-14 DIAGNOSIS — K861 Other chronic pancreatitis: Secondary | ICD-10-CM | POA: Diagnosis not present

## 2023-04-14 DIAGNOSIS — M5116 Intervertebral disc disorders with radiculopathy, lumbar region: Secondary | ICD-10-CM | POA: Diagnosis not present

## 2023-04-14 DIAGNOSIS — G9341 Metabolic encephalopathy: Secondary | ICD-10-CM | POA: Diagnosis not present

## 2023-04-16 DIAGNOSIS — I10 Essential (primary) hypertension: Secondary | ICD-10-CM | POA: Diagnosis not present

## 2023-04-16 DIAGNOSIS — Z7902 Long term (current) use of antithrombotics/antiplatelets: Secondary | ICD-10-CM | POA: Diagnosis not present

## 2023-04-16 DIAGNOSIS — N390 Urinary tract infection, site not specified: Secondary | ICD-10-CM | POA: Diagnosis not present

## 2023-04-16 DIAGNOSIS — B49 Unspecified mycosis: Secondary | ICD-10-CM | POA: Diagnosis not present

## 2023-04-16 DIAGNOSIS — B952 Enterococcus as the cause of diseases classified elsewhere: Secondary | ICD-10-CM | POA: Diagnosis not present

## 2023-04-16 DIAGNOSIS — K861 Other chronic pancreatitis: Secondary | ICD-10-CM | POA: Diagnosis not present

## 2023-04-16 DIAGNOSIS — K219 Gastro-esophageal reflux disease without esophagitis: Secondary | ICD-10-CM | POA: Diagnosis not present

## 2023-04-16 DIAGNOSIS — E538 Deficiency of other specified B group vitamins: Secondary | ICD-10-CM | POA: Diagnosis not present

## 2023-04-16 DIAGNOSIS — Z960 Presence of urogenital implants: Secondary | ICD-10-CM | POA: Diagnosis not present

## 2023-04-16 DIAGNOSIS — D649 Anemia, unspecified: Secondary | ICD-10-CM | POA: Diagnosis not present

## 2023-04-16 DIAGNOSIS — K5090 Crohn's disease, unspecified, without complications: Secondary | ICD-10-CM | POA: Diagnosis not present

## 2023-04-16 DIAGNOSIS — M5116 Intervertebral disc disorders with radiculopathy, lumbar region: Secondary | ICD-10-CM | POA: Diagnosis not present

## 2023-04-16 DIAGNOSIS — G9341 Metabolic encephalopathy: Secondary | ICD-10-CM | POA: Diagnosis not present

## 2023-04-16 DIAGNOSIS — K5730 Diverticulosis of large intestine without perforation or abscess without bleeding: Secondary | ICD-10-CM | POA: Diagnosis not present

## 2023-04-16 DIAGNOSIS — I69354 Hemiplegia and hemiparesis following cerebral infarction affecting left non-dominant side: Secondary | ICD-10-CM | POA: Diagnosis not present

## 2023-04-16 DIAGNOSIS — F17210 Nicotine dependence, cigarettes, uncomplicated: Secondary | ICD-10-CM | POA: Diagnosis not present

## 2023-04-16 DIAGNOSIS — Z7982 Long term (current) use of aspirin: Secondary | ICD-10-CM | POA: Diagnosis not present

## 2023-04-16 DIAGNOSIS — Z556 Problems related to health literacy: Secondary | ICD-10-CM | POA: Diagnosis not present

## 2023-04-16 DIAGNOSIS — I700 Atherosclerosis of aorta: Secondary | ICD-10-CM | POA: Diagnosis not present

## 2023-04-16 DIAGNOSIS — Z79891 Long term (current) use of opiate analgesic: Secondary | ICD-10-CM | POA: Diagnosis not present

## 2023-04-20 DIAGNOSIS — Z960 Presence of urogenital implants: Secondary | ICD-10-CM | POA: Diagnosis not present

## 2023-04-20 DIAGNOSIS — B952 Enterococcus as the cause of diseases classified elsewhere: Secondary | ICD-10-CM | POA: Diagnosis not present

## 2023-04-20 DIAGNOSIS — G9341 Metabolic encephalopathy: Secondary | ICD-10-CM | POA: Diagnosis not present

## 2023-04-20 DIAGNOSIS — M5116 Intervertebral disc disorders with radiculopathy, lumbar region: Secondary | ICD-10-CM | POA: Diagnosis not present

## 2023-04-20 DIAGNOSIS — Z79891 Long term (current) use of opiate analgesic: Secondary | ICD-10-CM | POA: Diagnosis not present

## 2023-04-20 DIAGNOSIS — K861 Other chronic pancreatitis: Secondary | ICD-10-CM | POA: Diagnosis not present

## 2023-04-20 DIAGNOSIS — N390 Urinary tract infection, site not specified: Secondary | ICD-10-CM | POA: Diagnosis not present

## 2023-04-20 DIAGNOSIS — F17210 Nicotine dependence, cigarettes, uncomplicated: Secondary | ICD-10-CM | POA: Diagnosis not present

## 2023-04-20 DIAGNOSIS — Z7982 Long term (current) use of aspirin: Secondary | ICD-10-CM | POA: Diagnosis not present

## 2023-04-20 DIAGNOSIS — K5730 Diverticulosis of large intestine without perforation or abscess without bleeding: Secondary | ICD-10-CM | POA: Diagnosis not present

## 2023-04-20 DIAGNOSIS — Z7902 Long term (current) use of antithrombotics/antiplatelets: Secondary | ICD-10-CM | POA: Diagnosis not present

## 2023-04-20 DIAGNOSIS — D649 Anemia, unspecified: Secondary | ICD-10-CM | POA: Diagnosis not present

## 2023-04-20 DIAGNOSIS — I10 Essential (primary) hypertension: Secondary | ICD-10-CM | POA: Diagnosis not present

## 2023-04-20 DIAGNOSIS — E538 Deficiency of other specified B group vitamins: Secondary | ICD-10-CM | POA: Diagnosis not present

## 2023-04-20 DIAGNOSIS — K219 Gastro-esophageal reflux disease without esophagitis: Secondary | ICD-10-CM | POA: Diagnosis not present

## 2023-04-20 DIAGNOSIS — Z556 Problems related to health literacy: Secondary | ICD-10-CM | POA: Diagnosis not present

## 2023-04-20 DIAGNOSIS — K5090 Crohn's disease, unspecified, without complications: Secondary | ICD-10-CM | POA: Diagnosis not present

## 2023-04-20 DIAGNOSIS — I69354 Hemiplegia and hemiparesis following cerebral infarction affecting left non-dominant side: Secondary | ICD-10-CM | POA: Diagnosis not present

## 2023-04-20 DIAGNOSIS — B49 Unspecified mycosis: Secondary | ICD-10-CM | POA: Diagnosis not present

## 2023-04-20 DIAGNOSIS — I700 Atherosclerosis of aorta: Secondary | ICD-10-CM | POA: Diagnosis not present

## 2023-04-21 DIAGNOSIS — I700 Atherosclerosis of aorta: Secondary | ICD-10-CM | POA: Diagnosis not present

## 2023-04-21 DIAGNOSIS — Z7982 Long term (current) use of aspirin: Secondary | ICD-10-CM | POA: Diagnosis not present

## 2023-04-21 DIAGNOSIS — E538 Deficiency of other specified B group vitamins: Secondary | ICD-10-CM | POA: Diagnosis not present

## 2023-04-21 DIAGNOSIS — N390 Urinary tract infection, site not specified: Secondary | ICD-10-CM | POA: Diagnosis not present

## 2023-04-21 DIAGNOSIS — M5116 Intervertebral disc disorders with radiculopathy, lumbar region: Secondary | ICD-10-CM | POA: Diagnosis not present

## 2023-04-21 DIAGNOSIS — Z556 Problems related to health literacy: Secondary | ICD-10-CM | POA: Diagnosis not present

## 2023-04-21 DIAGNOSIS — Z960 Presence of urogenital implants: Secondary | ICD-10-CM | POA: Diagnosis not present

## 2023-04-21 DIAGNOSIS — B952 Enterococcus as the cause of diseases classified elsewhere: Secondary | ICD-10-CM | POA: Diagnosis not present

## 2023-04-21 DIAGNOSIS — Z79891 Long term (current) use of opiate analgesic: Secondary | ICD-10-CM | POA: Diagnosis not present

## 2023-04-21 DIAGNOSIS — K861 Other chronic pancreatitis: Secondary | ICD-10-CM | POA: Diagnosis not present

## 2023-04-21 DIAGNOSIS — I69354 Hemiplegia and hemiparesis following cerebral infarction affecting left non-dominant side: Secondary | ICD-10-CM | POA: Diagnosis not present

## 2023-04-21 DIAGNOSIS — B49 Unspecified mycosis: Secondary | ICD-10-CM | POA: Diagnosis not present

## 2023-04-21 DIAGNOSIS — Z7902 Long term (current) use of antithrombotics/antiplatelets: Secondary | ICD-10-CM | POA: Diagnosis not present

## 2023-04-21 DIAGNOSIS — D649 Anemia, unspecified: Secondary | ICD-10-CM | POA: Diagnosis not present

## 2023-04-21 DIAGNOSIS — F17210 Nicotine dependence, cigarettes, uncomplicated: Secondary | ICD-10-CM | POA: Diagnosis not present

## 2023-04-21 DIAGNOSIS — G9341 Metabolic encephalopathy: Secondary | ICD-10-CM | POA: Diagnosis not present

## 2023-04-21 DIAGNOSIS — K5730 Diverticulosis of large intestine without perforation or abscess without bleeding: Secondary | ICD-10-CM | POA: Diagnosis not present

## 2023-04-21 DIAGNOSIS — K5090 Crohn's disease, unspecified, without complications: Secondary | ICD-10-CM | POA: Diagnosis not present

## 2023-04-21 DIAGNOSIS — I10 Essential (primary) hypertension: Secondary | ICD-10-CM | POA: Diagnosis not present

## 2023-04-21 DIAGNOSIS — K219 Gastro-esophageal reflux disease without esophagitis: Secondary | ICD-10-CM | POA: Diagnosis not present

## 2023-04-22 DIAGNOSIS — I469 Cardiac arrest, cause unspecified: Secondary | ICD-10-CM | POA: Diagnosis not present

## 2023-04-22 DIAGNOSIS — I639 Cerebral infarction, unspecified: Secondary | ICD-10-CM | POA: Diagnosis not present

## 2023-04-22 DIAGNOSIS — I499 Cardiac arrhythmia, unspecified: Secondary | ICD-10-CM | POA: Diagnosis not present

## 2023-04-22 DIAGNOSIS — R0689 Other abnormalities of breathing: Secondary | ICD-10-CM | POA: Diagnosis not present

## 2023-04-22 DIAGNOSIS — Z743 Need for continuous supervision: Secondary | ICD-10-CM | POA: Diagnosis not present

## 2023-04-22 DIAGNOSIS — Z8673 Personal history of transient ischemic attack (TIA), and cerebral infarction without residual deficits: Secondary | ICD-10-CM | POA: Diagnosis not present

## 2023-04-22 DIAGNOSIS — F17200 Nicotine dependence, unspecified, uncomplicated: Secondary | ICD-10-CM | POA: Diagnosis not present

## 2023-04-22 DIAGNOSIS — R404 Transient alteration of awareness: Secondary | ICD-10-CM | POA: Diagnosis not present

## 2023-04-24 ENCOUNTER — Other Ambulatory Visit: Payer: Self-pay

## 2023-04-24 DIAGNOSIS — I6523 Occlusion and stenosis of bilateral carotid arteries: Secondary | ICD-10-CM

## 2023-05-13 ENCOUNTER — Inpatient Hospital Stay: Payer: Medicare Other | Admitting: Neurology

## 2023-05-17 DEATH — deceased

## 2023-07-07 ENCOUNTER — Ambulatory Visit: Payer: Medicare Other | Admitting: Vascular Surgery

## 2023-07-07 ENCOUNTER — Encounter (HOSPITAL_COMMUNITY): Payer: Medicare Other
